# Patient Record
Sex: Female | Born: 1960
Health system: Southern US, Community
[De-identification: ages and names within clinical notes are randomized; demographics above are authoritative.]

## PROBLEM LIST (undated history)

## (undated) ENCOUNTER — Emergency Department (HOSPITAL_BASED_OUTPATIENT_CLINIC_OR_DEPARTMENT_OTHER): Admission: EM | Payer: 59 | Source: Home / Self Care

## (undated) DIAGNOSIS — K279 Peptic ulcer, site unspecified, unspecified as acute or chronic, without hemorrhage or perforation: Secondary | ICD-10-CM

## (undated) DIAGNOSIS — K810 Acute cholecystitis: Secondary | ICD-10-CM

## (undated) DIAGNOSIS — D649 Anemia, unspecified: Secondary | ICD-10-CM

## (undated) DIAGNOSIS — F419 Anxiety disorder, unspecified: Secondary | ICD-10-CM

## (undated) DIAGNOSIS — G43909 Migraine, unspecified, not intractable, without status migrainosus: Secondary | ICD-10-CM

## (undated) DIAGNOSIS — E785 Hyperlipidemia, unspecified: Secondary | ICD-10-CM

## (undated) DIAGNOSIS — Z8709 Personal history of other diseases of the respiratory system: Secondary | ICD-10-CM

## (undated) DIAGNOSIS — M62838 Other muscle spasm: Secondary | ICD-10-CM

## (undated) DIAGNOSIS — Z8719 Personal history of other diseases of the digestive system: Secondary | ICD-10-CM

## (undated) DIAGNOSIS — E039 Hypothyroidism, unspecified: Secondary | ICD-10-CM

## (undated) DIAGNOSIS — R51 Headache: Secondary | ICD-10-CM

## (undated) DIAGNOSIS — F5104 Psychophysiologic insomnia: Secondary | ICD-10-CM

## (undated) DIAGNOSIS — F32A Depression, unspecified: Secondary | ICD-10-CM

## (undated) DIAGNOSIS — G40909 Epilepsy, unspecified, not intractable, without status epilepticus: Secondary | ICD-10-CM

## (undated) DIAGNOSIS — R110 Nausea: Secondary | ICD-10-CM

## (undated) DIAGNOSIS — L280 Lichen simplex chronicus: Secondary | ICD-10-CM

## (undated) DIAGNOSIS — T8859XA Other complications of anesthesia, initial encounter: Secondary | ICD-10-CM

## (undated) DIAGNOSIS — E162 Hypoglycemia, unspecified: Secondary | ICD-10-CM

## (undated) DIAGNOSIS — A048 Other specified bacterial intestinal infections: Secondary | ICD-10-CM

## (undated) DIAGNOSIS — I82409 Acute embolism and thrombosis of unspecified deep veins of unspecified lower extremity: Secondary | ICD-10-CM

## (undated) DIAGNOSIS — K5900 Constipation, unspecified: Secondary | ICD-10-CM

## (undated) DIAGNOSIS — M7551 Bursitis of right shoulder: Secondary | ICD-10-CM

## (undated) DIAGNOSIS — G609 Hereditary and idiopathic neuropathy, unspecified: Secondary | ICD-10-CM

## (undated) DIAGNOSIS — E669 Obesity, unspecified: Secondary | ICD-10-CM

## (undated) DIAGNOSIS — K219 Gastro-esophageal reflux disease without esophagitis: Secondary | ICD-10-CM

## (undated) DIAGNOSIS — R569 Unspecified convulsions: Secondary | ICD-10-CM

## (undated) DIAGNOSIS — R0602 Shortness of breath: Secondary | ICD-10-CM

## (undated) DIAGNOSIS — R112 Nausea with vomiting, unspecified: Secondary | ICD-10-CM

## (undated) DIAGNOSIS — T7840XA Allergy, unspecified, initial encounter: Secondary | ICD-10-CM

## (undated) DIAGNOSIS — Z9889 Other specified postprocedural states: Secondary | ICD-10-CM

## (undated) DIAGNOSIS — M797 Fibromyalgia: Secondary | ICD-10-CM

## (undated) DIAGNOSIS — T4145XA Adverse effect of unspecified anesthetic, initial encounter: Secondary | ICD-10-CM

## (undated) DIAGNOSIS — M1990 Unspecified osteoarthritis, unspecified site: Secondary | ICD-10-CM

## (undated) DIAGNOSIS — R609 Edema, unspecified: Secondary | ICD-10-CM

## (undated) DIAGNOSIS — R600 Localized edema: Secondary | ICD-10-CM

## (undated) DIAGNOSIS — R1310 Dysphagia, unspecified: Secondary | ICD-10-CM

## (undated) DIAGNOSIS — F4323 Adjustment disorder with mixed anxiety and depressed mood: Secondary | ICD-10-CM

## (undated) DIAGNOSIS — J019 Acute sinusitis, unspecified: Secondary | ICD-10-CM

## (undated) DIAGNOSIS — F329 Major depressive disorder, single episode, unspecified: Secondary | ICD-10-CM

## (undated) HISTORY — DX: Peptic ulcer, site unspecified, unspecified as acute or chronic, without hemorrhage or perforation: K27.9

## (undated) HISTORY — DX: Depression, unspecified: F32.A

## (undated) HISTORY — DX: Other specified postprocedural states: Z98.890

## (undated) HISTORY — DX: Anemia, unspecified: D64.9

## (undated) HISTORY — DX: Bursitis of right shoulder: M75.51

## (undated) HISTORY — DX: Obesity, unspecified: E66.9

## (undated) HISTORY — DX: Psychophysiologic insomnia: F51.04

## (undated) HISTORY — DX: Migraine, unspecified, not intractable, without status migrainosus: G43.909

## (undated) HISTORY — DX: Acute cholecystitis: K81.0

## (undated) HISTORY — DX: Major depressive disorder, single episode, unspecified: F32.9

## (undated) HISTORY — DX: Headache: R51

## (undated) HISTORY — DX: Adjustment disorder with mixed anxiety and depressed mood: F43.23

## (undated) HISTORY — DX: Hereditary and idiopathic neuropathy, unspecified: G60.9

## (undated) HISTORY — DX: Epilepsy, unspecified, not intractable, without status epilepticus: G40.909

## (undated) HISTORY — DX: Hyperlipidemia, unspecified: E78.5

## (undated) HISTORY — DX: Allergy, unspecified, initial encounter: T78.40XA

## (undated) HISTORY — PX: COLONOSCOPY: SHX174

## (undated) HISTORY — DX: Other specified bacterial intestinal infections: A04.8

## (undated) HISTORY — DX: Acute sinusitis, unspecified: J01.90

## (undated) HISTORY — DX: Gastro-esophageal reflux disease without esophagitis: K21.9

---
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
## 1998-04-16 ENCOUNTER — Ambulatory Visit (HOSPITAL_COMMUNITY): Admission: RE | Admit: 1998-04-16 | Discharge: 1998-04-16 | Payer: Self-pay | Admitting: Gastroenterology

## 1998-06-03 ENCOUNTER — Ambulatory Visit (HOSPITAL_COMMUNITY): Admission: RE | Admit: 1998-06-03 | Discharge: 1998-06-03 | Payer: Self-pay | Admitting: Hematology & Oncology

## 1998-06-04 ENCOUNTER — Ambulatory Visit (HOSPITAL_COMMUNITY): Admission: RE | Admit: 1998-06-04 | Discharge: 1998-06-04 | Payer: Self-pay | Admitting: Hematology & Oncology

## 1998-07-13 ENCOUNTER — Encounter (HOSPITAL_COMMUNITY): Admission: RE | Admit: 1998-07-13 | Discharge: 1998-10-11 | Payer: Self-pay | Admitting: Hematology & Oncology

## 1998-12-11 DIAGNOSIS — Z9889 Other specified postprocedural states: Secondary | ICD-10-CM

## 1998-12-11 HISTORY — DX: Other specified postprocedural states: Z98.890

## 1998-12-11 HISTORY — PX: ABDOMINAL HYSTERECTOMY: SHX81

## 1999-05-02 ENCOUNTER — Ambulatory Visit (HOSPITAL_COMMUNITY): Admission: RE | Admit: 1999-05-02 | Discharge: 1999-05-02 | Payer: Self-pay | Admitting: Obstetrics and Gynecology

## 1999-06-15 ENCOUNTER — Encounter (HOSPITAL_COMMUNITY): Admission: RE | Admit: 1999-06-15 | Discharge: 1999-09-13 | Payer: Self-pay | Admitting: Hematology & Oncology

## 1999-06-28 LAB — CONVERTED CEMR LAB: Pap Smear: ABNORMAL

## 2000-01-02 ENCOUNTER — Ambulatory Visit (HOSPITAL_COMMUNITY): Admission: RE | Admit: 2000-01-02 | Discharge: 2000-01-02 | Payer: Self-pay | Admitting: Obstetrics and Gynecology

## 2000-03-27 ENCOUNTER — Encounter (HOSPITAL_COMMUNITY): Admission: RE | Admit: 2000-03-27 | Discharge: 2000-06-25 | Payer: Self-pay | Admitting: Hematology & Oncology

## 2001-10-04 ENCOUNTER — Ambulatory Visit (HOSPITAL_COMMUNITY): Admission: RE | Admit: 2001-10-04 | Discharge: 2001-10-04 | Payer: Self-pay | Admitting: Obstetrics and Gynecology

## 2001-10-04 ENCOUNTER — Encounter: Payer: Self-pay | Admitting: Obstetrics and Gynecology

## 2002-04-22 ENCOUNTER — Encounter: Payer: Self-pay | Admitting: Rheumatology

## 2002-04-22 ENCOUNTER — Encounter: Admission: RE | Admit: 2002-04-22 | Discharge: 2002-04-22 | Payer: Self-pay | Admitting: Rheumatology

## 2002-05-12 ENCOUNTER — Encounter: Payer: Self-pay | Admitting: Internal Medicine

## 2002-10-06 ENCOUNTER — Ambulatory Visit (HOSPITAL_COMMUNITY): Admission: RE | Admit: 2002-10-06 | Discharge: 2002-10-06 | Payer: Self-pay | Admitting: Obstetrics and Gynecology

## 2002-10-06 ENCOUNTER — Encounter: Payer: Self-pay | Admitting: Obstetrics and Gynecology

## 2003-10-09 ENCOUNTER — Ambulatory Visit (HOSPITAL_COMMUNITY): Admission: RE | Admit: 2003-10-09 | Discharge: 2003-10-09 | Payer: Self-pay | Admitting: Obstetrics and Gynecology

## 2004-10-07 ENCOUNTER — Ambulatory Visit (HOSPITAL_COMMUNITY): Admission: RE | Admit: 2004-10-07 | Discharge: 2004-10-07 | Payer: Self-pay | Admitting: Obstetrics and Gynecology

## 2004-10-12 ENCOUNTER — Emergency Department (HOSPITAL_COMMUNITY): Admission: EM | Admit: 2004-10-12 | Discharge: 2004-10-12 | Payer: Self-pay | Admitting: Emergency Medicine

## 2004-10-21 ENCOUNTER — Ambulatory Visit (HOSPITAL_COMMUNITY): Admission: RE | Admit: 2004-10-21 | Discharge: 2004-10-21 | Payer: Self-pay | Admitting: Pediatrics

## 2004-10-27 ENCOUNTER — Ambulatory Visit: Payer: Self-pay | Admitting: Hematology & Oncology

## 2004-12-20 ENCOUNTER — Ambulatory Visit: Payer: Self-pay | Admitting: Hematology & Oncology

## 2005-03-20 ENCOUNTER — Ambulatory Visit: Payer: Self-pay | Admitting: Hematology & Oncology

## 2005-08-10 ENCOUNTER — Ambulatory Visit: Payer: Self-pay | Admitting: Hematology & Oncology

## 2005-10-09 ENCOUNTER — Ambulatory Visit (HOSPITAL_COMMUNITY): Admission: RE | Admit: 2005-10-09 | Discharge: 2005-10-09 | Payer: Self-pay | Admitting: Obstetrics and Gynecology

## 2005-10-16 ENCOUNTER — Ambulatory Visit: Payer: Self-pay | Admitting: Hematology & Oncology

## 2006-01-09 ENCOUNTER — Ambulatory Visit: Payer: Self-pay | Admitting: Hematology & Oncology

## 2006-04-02 ENCOUNTER — Ambulatory Visit: Payer: Self-pay | Admitting: Hematology & Oncology

## 2006-04-02 LAB — CBC WITH DIFFERENTIAL/PLATELET
Basophils Absolute: 0 10*3/uL (ref 0.0–0.1)
Eosinophils Absolute: 0.1 10*3/uL (ref 0.0–0.5)
HGB: 12 g/dL (ref 11.6–15.9)
LYMPH%: 19.8 % (ref 14.0–48.0)
MCV: 71.3 fL — ABNORMAL LOW (ref 81.0–101.0)
MONO#: 0.4 10*3/uL (ref 0.1–0.9)
MONO%: 8.9 % (ref 0.0–13.0)
NEUT#: 3.2 10*3/uL (ref 1.5–6.5)
Platelets: 225 10*3/uL (ref 145–400)
RDW: 15.1 % — ABNORMAL HIGH (ref 11.3–14.5)

## 2006-04-02 LAB — COMPREHENSIVE METABOLIC PANEL
Albumin: 4.3 g/dL (ref 3.5–5.2)
Alkaline Phosphatase: 61 U/L (ref 39–117)
BUN: 10 mg/dL (ref 6–23)
CO2: 26 mEq/L (ref 19–32)
Glucose, Bld: 79 mg/dL (ref 70–99)
Potassium: 3.5 mEq/L (ref 3.5–5.3)

## 2006-04-02 LAB — LIPID PANEL
Cholesterol: 262 mg/dL — ABNORMAL HIGH (ref 0–200)
HDL: 79 mg/dL (ref >39)
LDL Cholesterol: 166 mg/dL — ABNORMAL HIGH (ref 0–99)
Triglycerides: 84 mg/dL (ref <150)

## 2006-05-28 ENCOUNTER — Ambulatory Visit: Payer: Self-pay | Admitting: Hematology & Oncology

## 2006-05-28 LAB — URINALYSIS, MICROSCOPIC - CHCC
Bilirubin (Urine): NEGATIVE
Blood: NEGATIVE
Glucose: NEGATIVE g/dL
Specific Gravity, Urine: 1.03 (ref 1.003–1.035)
WBC, UA: NEGATIVE (ref 0–2)

## 2006-06-05 LAB — URINALYSIS, MICROSCOPIC - CHCC
Bilirubin (Urine): NEGATIVE
Glucose: NEGATIVE g/dL
Leukocyte Esterase: NEGATIVE
Nitrite: NEGATIVE
pH: 6.5 (ref 4.6–8.0)

## 2006-09-04 ENCOUNTER — Ambulatory Visit: Payer: Self-pay | Admitting: Hematology & Oncology

## 2006-09-04 LAB — CBC WITH DIFFERENTIAL/PLATELET
Basophils Absolute: 0 10*3/uL (ref 0.0–0.1)
EOS%: 1.8 % (ref 0.0–7.0)
LYMPH%: 18.5 % (ref 14.0–48.0)
MCH: 23 pg — ABNORMAL LOW (ref 26.0–34.0)
MCV: 70.9 fL — ABNORMAL LOW (ref 81.0–101.0)
MONO%: 10.1 % (ref 0.0–13.0)
Platelets: 210 10*3/uL (ref 145–400)
RBC: 5.24 10*6/uL (ref 3.70–5.32)
RDW: 15.2 % — ABNORMAL HIGH (ref 11.3–14.5)

## 2006-09-04 LAB — LIPID PANEL
Cholesterol: 274 mg/dL — ABNORMAL HIGH (ref 0–200)
HDL: 72 mg/dL (ref >39)
Total CHOL/HDL Ratio: 3.8 Ratio
Triglycerides: 56 mg/dL (ref <150)

## 2006-09-04 LAB — COMPREHENSIVE METABOLIC PANEL
BUN: 10 mg/dL (ref 6–23)
CO2: 24 mEq/L (ref 19–32)
Creatinine, Ser: 0.77 mg/dL (ref 0.40–1.20)
Glucose, Bld: 86 mg/dL (ref 70–99)
Total Bilirubin: 0.6 mg/dL (ref 0.3–1.2)

## 2006-10-17 LAB — CBC WITH DIFFERENTIAL/PLATELET
BASO%: 0.3 % (ref 0.0–2.0)
Eosinophils Absolute: 0.2 10*3/uL (ref 0.0–0.5)
MONO#: 0.6 10*3/uL (ref 0.1–0.9)
NEUT#: 4.2 10*3/uL (ref 1.5–6.5)
RBC: 5.51 10*6/uL — ABNORMAL HIGH (ref 3.70–5.32)
RDW: 14.8 % — ABNORMAL HIGH (ref 11.3–14.5)
WBC: 5.9 10*3/uL (ref 3.9–10.0)
lymph#: 0.9 10*3/uL (ref 0.9–3.3)

## 2006-10-17 LAB — LIPID PANEL
Cholesterol: 189 mg/dL (ref 0–200)
Total CHOL/HDL Ratio: 2.2 Ratio
Triglycerides: 59 mg/dL (ref <150)
VLDL: 12 mg/dL (ref 0–40)

## 2006-11-05 ENCOUNTER — Ambulatory Visit (HOSPITAL_COMMUNITY): Admission: RE | Admit: 2006-11-05 | Discharge: 2006-11-05 | Payer: Self-pay | Admitting: Obstetrics and Gynecology

## 2006-12-19 ENCOUNTER — Ambulatory Visit: Payer: Self-pay | Admitting: Hematology & Oncology

## 2006-12-19 LAB — COMPREHENSIVE METABOLIC PANEL
AST: 21 U/L (ref 0–37)
Alkaline Phosphatase: 67 U/L (ref 39–117)
BUN: 9 mg/dL (ref 6–23)
Creatinine, Ser: 0.76 mg/dL (ref 0.40–1.20)
Glucose, Bld: 85 mg/dL (ref 70–99)
Total Bilirubin: 0.7 mg/dL (ref 0.3–1.2)

## 2006-12-19 LAB — CBC WITH DIFFERENTIAL/PLATELET
Basophils Absolute: 0 10*3/uL (ref 0.0–0.1)
EOS%: 1.9 % (ref 0.0–7.0)
Eosinophils Absolute: 0.1 10*3/uL (ref 0.0–0.5)
HCT: 38.8 % (ref 34.8–46.6)
HGB: 12.5 g/dL (ref 11.6–15.9)
LYMPH%: 16.8 % (ref 14.0–48.0)
MCH: 23.1 pg — ABNORMAL LOW (ref 26.0–34.0)
MCV: 71.6 fL — ABNORMAL LOW (ref 81.0–101.0)
MONO%: 9.5 % (ref 0.0–13.0)
NEUT%: 71.5 % (ref 39.6–76.8)
Platelets: 198 10*3/uL (ref 145–400)
RDW: 14.6 % — ABNORMAL HIGH (ref 11.3–14.5)

## 2006-12-19 LAB — LIPID PANEL
LDL Cholesterol: 115 mg/dL — ABNORMAL HIGH (ref 0–99)
VLDL: 11 mg/dL (ref 0–40)

## 2007-12-10 ENCOUNTER — Ambulatory Visit (HOSPITAL_COMMUNITY): Admission: RE | Admit: 2007-12-10 | Discharge: 2007-12-10 | Payer: Self-pay | Admitting: Obstetrics and Gynecology

## 2008-01-28 ENCOUNTER — Ambulatory Visit: Payer: Self-pay | Admitting: Hematology & Oncology

## 2008-01-28 LAB — COMPREHENSIVE METABOLIC PANEL
ALT: <8 U/L (ref 0–35)
AST: 11 U/L (ref 0–37)
Alkaline Phosphatase: 57 U/L (ref 39–117)
CO2: 23 mEq/L (ref 19–32)
Sodium: 138 mEq/L (ref 135–145)
Total Bilirubin: 0.7 mg/dL (ref 0.3–1.2)
Total Protein: 6.6 g/dL (ref 6.0–8.3)

## 2008-01-28 LAB — CBC WITH DIFFERENTIAL/PLATELET
Basophils Absolute: 0 10*3/uL (ref 0.0–0.1)
Eosinophils Absolute: 0.1 10*3/uL (ref 0.0–0.5)
HCT: 38.4 % (ref 34.8–46.6)
HGB: 11.7 g/dL (ref 11.6–15.9)
LYMPH%: 23.7 % (ref 14.0–48.0)
MCH: 21.2 pg — ABNORMAL LOW (ref 26.0–34.0)
MCV: 69.4 fL — ABNORMAL LOW (ref 81.0–101.0)
MONO%: 8.6 % (ref 0.0–13.0)
NEUT#: 3.1 10*3/uL (ref 1.5–6.5)
NEUT%: 64.8 % (ref 39.6–76.8)
Platelets: 196 10*3/uL (ref 145–400)

## 2008-01-28 LAB — LIPID PANEL
LDL Cholesterol: 95 mg/dL (ref 0–99)
Total CHOL/HDL Ratio: 2.7 Ratio
VLDL: 11 mg/dL (ref 0–40)

## 2008-01-28 LAB — TSH: TSH: 1.968 u[IU]/mL (ref 0.350–5.500)

## 2008-06-03 ENCOUNTER — Ambulatory Visit: Payer: Self-pay | Admitting: Hematology & Oncology

## 2008-06-03 LAB — CBC WITH DIFFERENTIAL/PLATELET
Basophils Absolute: 0 10*3/uL (ref 0.0–0.1)
EOS%: 3 % (ref 0.0–7.0)
HGB: 11.5 g/dL — ABNORMAL LOW (ref 11.6–15.9)
LYMPH%: 22.3 % (ref 14.0–48.0)
MCH: 22.7 pg — ABNORMAL LOW (ref 26.0–34.0)
MCV: 69.2 fL — ABNORMAL LOW (ref 81.0–101.0)
MONO%: 9.7 % (ref 0.0–13.0)
Platelets: 199 10*3/uL (ref 145–400)
RDW: 15.1 % — ABNORMAL HIGH (ref 11.3–14.5)

## 2008-06-04 LAB — COMPREHENSIVE METABOLIC PANEL
Albumin: 4 g/dL (ref 3.5–5.2)
BUN: 10 mg/dL (ref 6–23)
Calcium: 8.7 mg/dL (ref 8.4–10.5)
Chloride: 106 mEq/L (ref 96–112)
Creatinine, Ser: 0.87 mg/dL (ref 0.40–1.20)
Glucose, Bld: 79 mg/dL (ref 70–99)
Potassium: 3.9 mEq/L (ref 3.5–5.3)

## 2008-06-04 LAB — LIPID PANEL
Cholesterol: 187 mg/dL (ref 0–200)
Triglycerides: 66 mg/dL (ref <150)

## 2008-06-04 LAB — VITAMIN D 25 HYDROXY (VIT D DEFICIENCY, FRACTURES): Vit D, 25-Hydroxy: 8 ng/mL — ABNORMAL LOW (ref 30–89)

## 2008-07-12 ENCOUNTER — Emergency Department (HOSPITAL_COMMUNITY): Admission: EM | Admit: 2008-07-12 | Discharge: 2008-07-12 | Payer: Self-pay | Admitting: Emergency Medicine

## 2009-01-11 ENCOUNTER — Ambulatory Visit (HOSPITAL_COMMUNITY): Admission: RE | Admit: 2009-01-11 | Discharge: 2009-01-11 | Payer: Self-pay | Admitting: Obstetrics and Gynecology

## 2009-01-15 ENCOUNTER — Encounter: Admission: RE | Admit: 2009-01-15 | Discharge: 2009-01-15 | Payer: Self-pay | Admitting: Obstetrics and Gynecology

## 2009-06-29 LAB — HM COLONOSCOPY: HM Colonoscopy: NORMAL

## 2009-07-01 ENCOUNTER — Ambulatory Visit: Payer: Self-pay | Admitting: Hematology & Oncology

## 2009-07-01 LAB — CBC WITH DIFFERENTIAL/PLATELET
Eosinophils Absolute: 0.1 10*3/uL (ref 0.0–0.5)
HCT: 36.4 % (ref 34.8–46.6)
HGB: 11.7 g/dL (ref 11.6–15.9)
LYMPH%: 19.5 % (ref 14.0–49.7)
MONO#: 0.4 10*3/uL (ref 0.1–0.9)
NEUT#: 3.7 10*3/uL (ref 1.5–6.5)
NEUT%: 70.3 % (ref 38.4–76.8)
Platelets: 190 10*3/uL (ref 145–400)
WBC: 5.2 10*3/uL (ref 3.9–10.3)

## 2009-07-01 LAB — LIPID PANEL
Cholesterol: 253 mg/dL — ABNORMAL HIGH (ref 0–200)
Total CHOL/HDL Ratio: 3.9 Ratio
Triglycerides: 61 mg/dL (ref <150)
VLDL: 12 mg/dL (ref 0–40)

## 2009-07-01 LAB — COMPREHENSIVE METABOLIC PANEL
CO2: 23 mEq/L (ref 19–32)
Creatinine, Ser: 0.91 mg/dL (ref 0.40–1.20)
Glucose, Bld: 87 mg/dL (ref 70–99)
Total Bilirubin: 0.8 mg/dL (ref 0.3–1.2)
Total Protein: 6.8 g/dL (ref 6.0–8.3)

## 2009-09-24 ENCOUNTER — Encounter: Payer: Self-pay | Admitting: Internal Medicine

## 2009-09-24 ENCOUNTER — Ambulatory Visit: Payer: Self-pay | Admitting: Hematology & Oncology

## 2009-09-24 LAB — CBC WITH DIFFERENTIAL (CANCER CENTER ONLY)
BASO#: 0 10*3/uL (ref 0.0–0.2)
BASO%: 0.5 % (ref 0.0–2.0)
HCT: 37.1 % (ref 34.8–46.6)
HGB: 12.1 g/dL (ref 11.6–15.9)
LYMPH#: 1.4 10*3/uL (ref 0.9–3.3)
MONO#: 0.4 10*3/uL (ref 0.1–0.9)
NEUT#: 2.9 10*3/uL (ref 1.5–6.5)
NEUT%: 58.6 % (ref 39.6–80.0)
WBC: 4.9 10*3/uL (ref 3.9–10.0)

## 2009-09-24 LAB — COMPREHENSIVE METABOLIC PANEL
ALT: 12 U/L (ref 0–35)
AST: 20 U/L (ref 0–37)
Albumin: 4.6 g/dL (ref 3.5–5.2)
Alkaline Phosphatase: 64 U/L (ref 39–117)
BUN: 11 mg/dL (ref 6–23)
CO2: 27 mEq/L (ref 19–32)
Calcium: 8.9 mg/dL (ref 8.4–10.5)
Chloride: 101 mEq/L (ref 96–112)
Creatinine, Ser: 0.94 mg/dL (ref 0.40–1.20)
Glucose, Bld: 93 mg/dL (ref 70–99)
Potassium: 3.7 mEq/L (ref 3.5–5.3)
Sodium: 138 mEq/L (ref 135–145)
Total Bilirubin: 0.5 mg/dL (ref 0.3–1.2)
Total Protein: 7.4 g/dL (ref 6.0–8.3)

## 2009-09-24 LAB — LIPID PANEL: HDL: 67 mg/dL (ref >39)

## 2009-09-24 LAB — TSH: TSH: 2.165 u[IU]/mL (ref 0.350–4.500)

## 2009-09-28 ENCOUNTER — Ambulatory Visit: Payer: Self-pay | Admitting: Internal Medicine

## 2009-09-28 DIAGNOSIS — F418 Other specified anxiety disorders: Secondary | ICD-10-CM | POA: Insufficient documentation

## 2009-09-28 DIAGNOSIS — E782 Mixed hyperlipidemia: Secondary | ICD-10-CM | POA: Insufficient documentation

## 2009-09-28 DIAGNOSIS — D649 Anemia, unspecified: Secondary | ICD-10-CM | POA: Insufficient documentation

## 2009-09-28 DIAGNOSIS — R569 Unspecified convulsions: Secondary | ICD-10-CM | POA: Insufficient documentation

## 2009-09-28 DIAGNOSIS — F4323 Adjustment disorder with mixed anxiety and depressed mood: Secondary | ICD-10-CM

## 2009-09-28 DIAGNOSIS — E669 Obesity, unspecified: Secondary | ICD-10-CM | POA: Insufficient documentation

## 2009-09-28 HISTORY — DX: Adjustment disorder with mixed anxiety and depressed mood: F43.23

## 2009-09-28 LAB — CONVERTED CEMR LAB: Hgb A1c MFr Bld: 5.6 % (ref 4.6–6.1)

## 2009-09-29 ENCOUNTER — Telehealth: Payer: Self-pay | Admitting: Internal Medicine

## 2009-09-30 ENCOUNTER — Telehealth: Payer: Self-pay | Admitting: Internal Medicine

## 2009-11-09 ENCOUNTER — Ambulatory Visit: Payer: Self-pay | Admitting: Internal Medicine

## 2009-11-09 DIAGNOSIS — R110 Nausea: Secondary | ICD-10-CM | POA: Insufficient documentation

## 2009-11-09 DIAGNOSIS — IMO0001 Reserved for inherently not codable concepts without codable children: Secondary | ICD-10-CM | POA: Insufficient documentation

## 2009-11-16 ENCOUNTER — Encounter: Payer: Self-pay | Admitting: Internal Medicine

## 2009-11-16 LAB — CONVERTED CEMR LAB
ALT: 14 units/L (ref 0–35)
AST: 25 units/L (ref 0–37)
Albumin: 4.1 g/dL (ref 3.5–5.2)
Alkaline Phosphatase: 70 units/L (ref 39–117)
Bilirubin, Direct: 0.1 mg/dL (ref 0.0–0.3)
Cholesterol: 240 mg/dL — ABNORMAL HIGH (ref 0–200)
HDL: 77 mg/dL (ref >39)
Indirect Bilirubin: 0.3 mg/dL (ref 0.0–0.9)
LDL Cholesterol: 148 mg/dL — ABNORMAL HIGH (ref 0–99)
Total Bilirubin: 0.4 mg/dL (ref 0.3–1.2)
Total CHOL/HDL Ratio: 3.1
Total Protein: 6.7 g/dL (ref 6.0–8.3)
Triglycerides: 73 mg/dL (ref <150)
VLDL: 15 mg/dL (ref 0–40)

## 2009-11-23 ENCOUNTER — Ambulatory Visit: Payer: Self-pay | Admitting: Internal Medicine

## 2009-11-23 DIAGNOSIS — K5900 Constipation, unspecified: Secondary | ICD-10-CM | POA: Insufficient documentation

## 2009-12-07 ENCOUNTER — Telehealth: Payer: Self-pay | Admitting: Internal Medicine

## 2009-12-07 ENCOUNTER — Ambulatory Visit: Payer: Self-pay | Admitting: Internal Medicine

## 2009-12-07 DIAGNOSIS — J018 Other acute sinusitis: Secondary | ICD-10-CM | POA: Insufficient documentation

## 2010-01-17 LAB — HM MAMMOGRAPHY: HM Mammogram: NORMAL

## 2010-01-18 ENCOUNTER — Ambulatory Visit (HOSPITAL_COMMUNITY): Admission: RE | Admit: 2010-01-18 | Discharge: 2010-01-18 | Payer: Self-pay | Admitting: Obstetrics and Gynecology

## 2010-02-22 ENCOUNTER — Ambulatory Visit: Payer: Self-pay | Admitting: Internal Medicine

## 2010-02-22 DIAGNOSIS — J069 Acute upper respiratory infection, unspecified: Secondary | ICD-10-CM | POA: Insufficient documentation

## 2010-04-25 ENCOUNTER — Telehealth: Payer: Self-pay | Admitting: Internal Medicine

## 2010-05-17 ENCOUNTER — Ambulatory Visit: Payer: Self-pay | Admitting: Internal Medicine

## 2010-10-11 ENCOUNTER — Ambulatory Visit: Payer: Self-pay | Admitting: Internal Medicine

## 2010-10-21 LAB — CONVERTED CEMR LAB
BUN: 9 mg/dL (ref 6–23)
CO2: 27 meq/L (ref 19–32)
CRP, High Sensitivity: 8.8 — ABNORMAL HIGH
Calcium: 8.9 mg/dL (ref 8.4–10.5)
Chloride: 105 meq/L (ref 96–112)
Cholesterol: 266 mg/dL — ABNORMAL HIGH (ref 0–200)
Creatinine, Ser: 0.93 mg/dL (ref 0.40–1.20)
Glucose, Bld: 82 mg/dL (ref 70–99)
HDL: 77 mg/dL (ref >39)
LDL Cholesterol: 174 mg/dL — ABNORMAL HIGH (ref 0–99)
Potassium: 4.1 meq/L (ref 3.5–5.3)
Sodium: 141 meq/L (ref 135–145)
Total CHOL/HDL Ratio: 3.5
Triglycerides: 73 mg/dL (ref <150)
VLDL: 15 mg/dL (ref 0–40)

## 2010-10-22 ENCOUNTER — Telehealth: Payer: Self-pay | Admitting: Internal Medicine

## 2010-12-15 ENCOUNTER — Ambulatory Visit
Admission: RE | Admit: 2010-12-15 | Discharge: 2010-12-15 | Payer: Self-pay | Source: Home / Self Care | Attending: Internal Medicine | Admitting: Internal Medicine

## 2010-12-15 DIAGNOSIS — L259 Unspecified contact dermatitis, unspecified cause: Secondary | ICD-10-CM | POA: Insufficient documentation

## 2010-12-20 ENCOUNTER — Ambulatory Visit
Admission: RE | Admit: 2010-12-20 | Discharge: 2010-12-20 | Payer: Self-pay | Source: Home / Self Care | Attending: Internal Medicine | Admitting: Internal Medicine

## 2010-12-20 LAB — CONVERTED CEMR LAB
ALT: 15 units/L (ref 0–35)
AST: 21 units/L (ref 0–37)
Albumin: 4.6 g/dL (ref 3.5–5.2)
Alkaline Phosphatase: 70 units/L (ref 39–117)
Basophils Absolute: 0 10*3/uL (ref 0.0–0.1)
Basophils Relative: 0 % (ref 0–1)
Bilirubin, Direct: 0.1 mg/dL (ref 0.0–0.3)
Eosinophils Absolute: 0.1 10*3/uL (ref 0.0–0.7)
Eosinophils Relative: 1 % (ref 0–5)
Free T4: 0.92 ng/dL (ref 0.80–1.80)
HCT: 37.6 % (ref 36.0–46.0)
Hemoglobin: 11.8 g/dL — ABNORMAL LOW (ref 12.0–15.0)
IgE (Immunoglobulin E), Serum: 55.8 intl units/mL (ref 0.0–180.0)
Indirect Bilirubin: 0.5 mg/dL (ref 0.0–0.9)
Lymphocytes Relative: 11 % — ABNORMAL LOW (ref 12–46)
Lymphs Abs: 0.8 10*3/uL (ref 0.7–4.0)
MCHC: 31.4 g/dL (ref 30.0–36.0)
MCV: 71.8 fL — ABNORMAL LOW (ref 78.0–100.0)
Monocytes Absolute: 0.3 10*3/uL (ref 0.1–1.0)
Monocytes Relative: 4 % (ref 3–12)
Neutro Abs: 6.1 10*3/uL (ref 1.7–7.7)
Neutrophils Relative %: 84 % — ABNORMAL HIGH (ref 43–77)
Platelets: 233 10*3/uL (ref 150–400)
RBC: 5.24 M/uL — ABNORMAL HIGH (ref 3.87–5.11)
RDW: 15.3 % (ref 11.5–15.5)
TSH: 1.018 microintl units/mL (ref 0.350–4.500)
Total Bilirubin: 0.6 mg/dL (ref 0.3–1.2)
Total Protein: 7.5 g/dL (ref 6.0–8.3)
WBC: 7.2 10*3/uL (ref 4.0–10.5)

## 2010-12-22 ENCOUNTER — Telehealth: Payer: Self-pay | Admitting: Internal Medicine

## 2010-12-29 ENCOUNTER — Other Ambulatory Visit (HOSPITAL_COMMUNITY): Payer: Self-pay | Admitting: *Deleted

## 2010-12-29 DIAGNOSIS — Z1231 Encounter for screening mammogram for malignant neoplasm of breast: Secondary | ICD-10-CM

## 2010-12-29 DIAGNOSIS — Z0000 Encounter for general adult medical examination without abnormal findings: Secondary | ICD-10-CM

## 2011-01-12 NOTE — Assessment & Plan Note (Signed)
Summary: skin on fire mouth dry/mhf   Vital Signs:  Patient profile:   50 year old female Height:      67 inches O2 Sat:      100 % on Room air Temp:     97.6 degrees F oral Pulse rate:   77 / minute Resp:     18 per minute BP sitting:   120 / 90  (left arm) Cuff size:   large  Vitals Entered By: Glendell Docker CMA (December 15, 2010 11:48 AM)  O2 Flow:  Room air CC: Skin irritation Is Patient Diabetic? No Pain Assessment Patient in pain? no      Comments c/o sever skin irritation, burning sensation  on arms x 3 days, worse with cold weather    Primary Care Provider:  Dondra Spry DO  CC:  Skin irritation.  History of Present Illness: 50 y/o AA female c/o skin irritation of both arms area from wrist to shoulders affected red, warm and itchy  has hx of episodic skin flares seen by Dr. Terri Piedra with multple biopsies pt later referred to derm specialist at [facility] diagnosis unclear question eczema?  she denies new environmental exposure   Allergies: 1)  ! Darvocet 2)  ! Tylox 3)  ! Vicodin 4)  ! * Iv Iron 5)  ! 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 Sharene Skeans )  Bursitis - right shoulder PUD - Dr. Loreta Ave (H. Pylori) Colonoscopy -2000 Chronic Insomnia Migraines   Obesity   Past Surgical History: 2000-Hysterectomy-left ovary and tube intact 1972 -Tonsillectomy           Family History: Family History Breast cancer 1st degree relative <50 Family History of CAD Female 1st degree relative <60 Family History Diabetes 1st degree relative Family History of Prostate CA 1st degree relative <50             Social History: Occupation: Water quality scientist Divorced   2 sons 26, 62    Never Smoked   Alcohol use-no       Physical Exam  General:  alert, well-developed, and well-nourished.   Lungs:  normal respiratory effort and normal  breath sounds.   Heart:  normal rate, regular rhythm, and no gallop.     Impression & Recommendations:  Problem # 1:  DERMATITIS (ICD-692.9) probable allergic reaction.  unknown trigger.  tx with solumedrol and prednisone taper I doubt cellulitis but empiric clindamycin Patient advised to call office if symptoms persist or worsen. reassess in 1 week Her updated medication list for this problem includes:    Zyrtec Allergy 10 Mg Tbdp (Cetirizine hcl) ..... One by mouth once daily  Orders: Solumedrol up to 125mg  (Y7829) Admin of Therapeutic Inj  intramuscular or subcutaneous (56213)  Complete Medication List: 1)  Lorazepam 2 Mg Tabs (Lorazepam) .... One by mouth three times a day 2)  Sertraline Hcl 100 Mg Tabs (Sertraline hcl) .... 2 tabs by mouth qd 3)  Lyrica 50 Mg Caps (Pregabalin) .... One by mouth two times a day 4)  Zyrtec Allergy 10 Mg Tbdp (Cetirizine hcl) .... One by mouth once daily 5)  Nasonex 50 Mcg/act Susp (Mometasone furoate) .... 2 sprays each nostril once daily 6)  Omeprazole-sodium Bicarbonate 40-1100 Mg Caps (Omeprazole-sodium bicarbonate) .... One by mouth once daily 7)  Amitiza 24 Mcg Caps (Lubiprostone) .... One by mouth two times a day  8)  Crestor 20 Mg Tabs (Rosuvastatin calcium) .... One by mouth qd  Patient Instructions: 1)  Patient advised to call office if symptoms persist or worsen. 2)  Please schedule a follow-up appointment 12/19/2010 Prescriptions: PREDNISONE 10 MG TABS (PREDNISONE) one by mouth two times a day x 5 days then one by mouth once daily x 5 days  #15 x 0   Entered and Authorized by:   D. Thomos Lemons DO   Signed by:   D. Thomos Lemons DO on 12/15/2010   Method used:   Electronically to        [facility]* (retail)       [address]       [address] Shipping/mailing       [place], [place]  [address]       Ph: [phone]       Fax: [phone]   RxID:   [id number] CLINDAMYCIN HCL 300 MG CAPS (CLINDAMYCIN HCL) one  by mouth three times a day  #21 x 0   Entered and Authorized by:   D. Thomos Lemons DO   Signed by:   D. Thomos Lemons DO on 12/15/2010   Method used:   Electronically to        [facility]* (retail)       [address]       [address] Shipping/mailing       [place], [place]  [address]       Ph: [phone]       Fax: [phone]   RxID:   [id number]    Medication Administration  Injection # 1:    Medication: Solumedrol up to 125mg     Diagnosis: DERMATITIS (ICD-692.9)    Route: IM    Site: LUOQ gluteus    Exp Date: 03/10/2013    Lot #: [id number]    Mfr: PFIZER    Comments: 125 MG solu-medrol IM given    Patient tolerated injection without complications    Given by: Glendell Docker CMA (December 15, 2010 12:16 PM)  Orders Added: 1)  Solumedrol up to 125mg  [J2930] 2)  Admin of Therapeutic Inj  intramuscular or subcutaneous [96372] 3)  Est. Patient Level III [60630]    Current Allergies (reviewed today): ! DARVOCET ! TYLOX ! VICODIN ! * IV IRON ! * IVP DYE   Medication Administration  Injection # 1:    Medication: Solumedrol up to 125mg     Diagnosis: DERMATITIS (ICD-692.9)    Route: IM    Site: LUOQ gluteus    Exp Date: 03/10/2013    Lot #: [id number]    Mfr: PFIZER    Comments: 125 MG solu-medrol IM given    Patient tolerated injection without complications    Given by: Glendell Docker CMA (December 15, 2010 12:16 PM)  Orders Added: 1)  Solumedrol up to 125mg  [J2930] 2)  Admin of Therapeutic Inj  intramuscular or subcutaneous [96372] 3)  Est. Patient Level III [93235]

## 2011-01-12 NOTE — Assessment & Plan Note (Signed)
Summary: CPX/MHF   Vital Signs:  Patient profile:   50 year old female Height:      67 inches Weight:      238.50 pounds BMI:     37.49 O2 Sat:      100 % on Room air Temp:     98.1 degrees F oral Pulse rate:   95 / minute Pulse rhythm:   regular Resp:     18 per minute BP sitting:   122 / 90  (right arm) Cuff size:   large  Vitals Entered By: Glendell Docker CMA (October 11, 2010 2:15 PM)  O2 Flow:  Room air CC: CPX Is Patient Diabetic? No Pain Assessment Patient in pain? no        Primary Care Provider:  Dondra Spry DO  CC:  CPX.  History of Present Illness: 50 y/o AA female with hx obesity, fibromyalgia , anxiety / depression for routine cpx no sign int med hx wt is stable she has noticed intermittent DOE no chest pains  Preventive Screening-Counseling & Management  Alcohol-Tobacco     Alcohol drinks/day: <1     Smoking Status: current  Caffeine-Diet-Exercise     Caffeine use/day: 1 beverage daily     Does Patient Exercise: yes     Type of exercise: walking  Allergies: 1)  ! Darvocet 2)  ! Tylox 3)  ! Vicodin 4)  ! * Iv Iron 5)  ! 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 Sharene Skeans )  Bursitis - right shoulder PUD - Dr. Loreta Ave (H. Pylori) Colonoscopy -2000 Chronic Insomnia Migraines   Obesity   Past Surgical History: 2000-Hysterectomy-left ovary and tube intact 1972 -Tonsillectomy          Family History: Family History Breast cancer 1st degree relative <50 Family History of CAD Female 1st degree relative <60 Family History Diabetes 1st degree relative Family History of Prostate CA 1st degree relative <50            Social History: Occupation: Water quality scientist Divorced   2 sons 26, 68    Never Smoked   Alcohol use-no    Smoking Status:  current Caffeine use/day:  1 beverage daily Does Patient Exercise:   yes  Review of Systems       The patient complains of weight gain, dyspnea on exertion, and severe indigestion/heartburn.  The patient denies chest pain and depression.         chronic constipation no improvement with otc miralax or lactulose  Physical Exam  General:  alert and well-developed.   Head:  normocephalic and atraumatic.   Eyes:  pupils equal, pupils round, and pupils reactive to light.   Ears:  R ear normal and L ear normal.   Mouth:  pharynx pink and moist.   Neck:  No deformities, masses, or tenderness noted.no carotid bruits.   Lungs:  Normal respiratory effort, chest expands symmetrically. Lungs are clear to auscultation, no crackles or wheezes. Heart:  normal rate, regular rhythm, and no gallop.   Abdomen:  soft, non-tender, normal bowel sounds, no hepatomegaly, and no splenomegaly.   Extremities:  trace left pedal edema and trace right pedal edema.   Neurologic:  cranial nerves II-XII intact and gait normal.   Psych:  normally interactive, good eye contact, not anxious appearing, and not depressed appearing.     Impression & Recommendations:  Problem # 1:  HEALTH MAINTENANCE EXAM (ICD-V70.0) Reviewed adult health maintenance protocols.  Mammogram: Done (01/17/2010) Pap smear: normal (05/03/2010) Colonoscopy: normal (06/29/2009) Td Booster: Tdap (10/03/2005)   Flu Vax: Fluvax Non-MCR (09/17/2009)   Chol: 240 (11/16/2009)   HDL: 77 (11/16/2009)   LDL: 148 (11/16/2009)   TG: 73 (11/16/2009) HgbA1C: 5.6 (09/28/2009)     Orders: Gastroenterology Referral (GI) EKG w/ Interpretation (93000)  Problem # 2:  CONSTIPATION (ICD-564.00) use amitiza increase fluid intake  Problem # 3:  OBESITY (ICD-278.00) pt advised to stop use of phentermine. if dyspnea recurrs or gets worse, we discussed initiating cardiac work up  Orders: T-Basic Metabolic Panel [phone])  Complete Medication List: 1)  Lorazepam 2 Mg Tabs (Lorazepam) .... One by mouth three times a  day 2)  Sertraline Hcl 100 Mg Tabs (Sertraline hcl) .... 2 tabs by mouth qd 3)  Lyrica 50 Mg Caps (Pregabalin) .... One by mouth two times a day 4)  Zyrtec Allergy 10 Mg Tbdp (Cetirizine hcl) .... One by mouth once daily 5)  Nasonex 50 Mcg/act Susp (Mometasone furoate) .... 2 sprays each nostril once daily 6)  Omeprazole-sodium Bicarbonate 40-1100 Mg Caps (Omeprazole-sodium bicarbonate) .... One by mouth once daily 7)  Amitiza 24 Mcg Caps (Lubiprostone) .... One by mouth two times a day  Other Orders: T-Lipid Profile ([id number]) T- * Misc. Laboratory test [phone])  Patient Instructions: 1)  Please schedule a follow-up appointment in 6 months. Prescriptions: LORAZEPAM 2 MG TABS (LORAZEPAM) one by mouth three times a day  #90 x 5   Entered and Authorized by:   D. Thomos Lemons DO   Signed by:   D. Thomos Lemons DO on 10/11/2010   Method used:   Print then Give to Patient   RxID:   [id number] LYRICA 50 MG CAPS (PREGABALIN) one by mouth two times a day  #180 x 1   Entered and Authorized by:   D. Thomos Lemons DO   Signed by:   D. Thomos Lemons DO on 10/11/2010   Method used:   Print then Give to Patient   RxID:   [id number] SERTRALINE HCL 100 MG TABS (SERTRALINE HCL) 2 tabs by mouth qd  #180 x 1   Entered and Authorized by:   D. Thomos Lemons DO   Signed by:   D. Thomos Lemons DO on 10/11/2010   Method used:   Print then Give to Patient   RxID:   [id number] AMITIZA 24 MCG CAPS (LUBIPROSTONE) one by mouth two times a day  #60 x 5   Entered and Authorized by:   D. Thomos Lemons DO   Signed by:   D. Thomos Lemons DO on 10/11/2010   Method used:   Electronically to        [facility]* (retail)       [address]       [address] Shipping/mailing       [place], [place]  [address]       Ph: [phone]       Fax: [phone]   RxID:   [id number] OMEPRAZOLE-SODIUM BICARBONATE 40-1100 MG CAPS (OMEPRAZOLE-SODIUM BICARBONATE) one by mouth once daily  #90 x 1    Entered and Authorized by:   D. Thomos Lemons DO   Signed by:   D. Thomos Lemons DO on 10/11/2010   Method used:   Electronically to        [facility]* (retail)  [address]       [address] Shipping/mailing       [place], [place]  [address]       Ph: [phone]       Fax: [phone]   RxID:   [id number]    Orders Added: 1)  T-Lipid Profile [phone] 2)  T-Basic Metabolic Panel [phone] 3)  T- * Misc. Laboratory test [id number] 4)  Gastroenterology Referral [GI] 5)  EKG w/ Interpretation [93000] 6)  Est. Patient age 41-64 [21]       Current Allergies (reviewed today): ! DARVOCET ! TYLOX ! VICODIN ! * IV IRON ! * IVP DYE

## 2011-01-12 NOTE — Progress Notes (Signed)
Summary: blood work results  please fax   Phone Note Call from Patient   Caller: Patient Call For: Xaviera Flaten  Summary of Call: [phone] fax results of the bloodwork she had done   Initial call taken by: Roselle Locus,  December 22, 2010 11:28 AM  Follow-up for Phone Call        ok to fax results Follow-up by: D. Thomos Lemons DO,  December 22, 2010 5:14 PM  Additional Follow-up for Phone Call Additional follow up Details #1::        Results faxed to [phone] per patient request, per Dr Artist Pais okay Additional Follow-up by: Glendell Docker CMA,  December 23, 2010 10:34 AM

## 2011-01-12 NOTE — Assessment & Plan Note (Signed)
Summary: tear duct stopped up/mhf   Vital Signs:  Patient profile:   50 year old female Height:      67 inches Weight:      242.25 pounds BMI:     38.08 O2 Sat:      100 % on Room air Temp:     97.7 degrees F oral Pulse rate:   84 / minute Pulse rhythm:   regular Resp:     18 per minute BP sitting:   132 / 90  (right arm) Cuff size:   large  Vitals Entered By: Glendell Docker CMA (February 22, 2010 1:09 PM)  O2 Flow:  Room air CC: Rm 2- head congestion   Primary Care Provider:  Dondra Spry DO  CC:  Rm 2- head congestion.  History of Present Illness: 50 y/o AA female c/o left eye is irritated.  tear duct clogged?  ears feel full  head and chest congestion  she denies cough sneezing  Allergies: 1)  ! Darvocet 2)  ! Tylox 3)  ! Vicodin 4)  ! * Iv Iron 5)  ! 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 Sharene Skeans ) Bursitis - right shoulder PUD - Dr. Loreta Ave (H. 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   Eyes:  mild redness corner of left eye Ears:  R ear normal and L ear normal.   Mouth:  postnasal drip.   Neck:  supple and no masses.   Lungs:  normal respiratory effort and normal breath sounds.   Heart:  normal rate, regular rhythm, and no gallop.   Extremities:  No lower extremity edema    Impression &  Recommendations:  Problem # 1:  URI (ICD-465.9)  Her updated medication list for this problem includes:    Zyrtec Allergy 10 Mg Tbdp (Cetirizine hcl) ..... One by mouth once daily  Instructed on symptomatic treatment. Call if symptoms persist or worsen.   Problem # 2:  FIBROMYALGIA (ICD-729.1) Assessment: Improved good response to lyrica.    Complete Medication List: 1)  Lorazepam 2 Mg Tabs (Lorazepam) .... One by mouth three times a day 2)  Sertraline Hcl 100 Mg Tabs (Sertraline hcl) .... 2 tabs by mouth qd 3)  Ranitidine Hcl 150 Mg Tabs (Ranitidine hcl) .... One by mouth bid 4)  Phentermine Hcl 37.5 Mg Caps (Phentermine hcl) .... One by mouth once daily 5)  Lyrica 50 Mg Caps (Pregabalin) .... One by mouth two times a day 6)  Zyrtec Allergy 10 Mg Tbdp (Cetirizine hcl) .... One by mouth once daily 7)  Nasonex 50 Mcg/act Susp (Mometasone furoate) .... 2 sprays each nostril once daily 8)  Cefuroxime Axetil 500 Mg Tabs (Cefuroxime axetil) .... One by mouth bid  Patient Instructions: 1)  Use warm tea bag 2-3 x per day to left eye 2)  Call our office if  your symptoms do not  improve or gets worse. 3)  Please schedule a follow-up appointment in 6 months. Prescriptions: LYRICA 50 MG CAPS (PREGABALIN) one by mouth two times a day  #60 x 5   Entered and Authorized by:   D. Thomos Lemons DO   Signed by:   D. Thomos Lemons DO on 02/22/2010   Method used:   Print then Give to Patient   RxID:   [id number] CEFUROXIME AXETIL 500 MG TABS (CEFUROXIME AXETIL) one by mouth bid  #14 x 0   Entered and Authorized by:   D. Thomos Lemons DO   Signed by:   D. Thomos Lemons DO on 02/22/2010   Method used:   Print then Give to Patient   RxID:   [id number] NASONEX 50 MCG/ACT SUSP (MOMETASONE FUROATE) 2 sprays each nostril once daily  #1 x 2   Entered and Authorized by:   D. Thomos Lemons DO   Signed by:   D. Thomos Lemons DO on 02/22/2010   Method used:   Electronically to        [facility]* (retail)       [address]       [address] [place] [address]mailing       [place], [place]  [address]       Ph: [phone]       Fax: [phone]   RxID:   [id number]   Current Allergies (reviewed today): ! DARVOCET ! TYLOX ! VICODIN ! * IV IRON ! * IVP DYE   Preventive Care Screening  Mammogram:    Date:  01/17/2010    Results:  Done

## 2011-01-12 NOTE — Assessment & Plan Note (Signed)
Summary: fu meds/dt   Vital Signs:  Patient profile:   50 year old female Height:      67 inches Weight:      240 pounds BMI:     37.73 O2 Sat:      99 % on Room air Temp:     97.8 degrees F oral Pulse rate:   81 / minute Pulse rhythm:   regular Resp:     18 per minute BP sitting:   116 / 78  (right arm) Cuff size:   large  Vitals Entered By: Glendell Docker CMA (May 17, 2010 2:27 PM)  O2 Flow:  Room air CC: Rm 3- Follow up  Is Patient Diabetic? No Pain Assessment Patient in pain? no        Primary Care Provider:  DThomos Lemons DO  CC:  Rm 3- Follow up .  History of Present Illness:  50 y/o for f/u re:  obesity no side effects for phentermine denies elevate bp, no chest pain wt is stable.  diet is suboptimal.  eats more junk at night she lives with her dog.  she does not want to cook for herself  she c/o chronic constipation no relief with miralax  Allergies: 1)  ! Darvocet 2)  ! Tylox 3)  ! Vicodin 4)  ! * Iv Iron 5)  ! 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 Sharene Skeans )  Bursitis - right shoulder PUD - Dr. Loreta Ave (H. Pylori) Colonoscopy -2000 Chronic Insomnia Migraines Obesity   Family History: Family History Breast cancer 1st degree relative <50 Family History of CAD Female 1st degree relative <60 Family History Diabetes 1st degree relative Family History of Prostate CA 1st degree relative <50         Physical Exam  General:  alert, well-developed, and well-nourished.   Lungs:  normal respiratory effort and normal breath sounds.   Heart:  normal rate, regular rhythm, and no gallop.   Extremities:  No lower extremity edema  Psych:  normally interactive, good eye contact, not anxious appearing, and not depressed appearing.     Impression & Recommendations:  Problem # 1:  OBESITY (ICD-278.00) wt is stable.  she lives  by herself and dog.  hard to cook for herself at night.   we discussed using phentermine short term.  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  Maintain current medication regimen.  Problem # 3:  CONSTIPATION (ICD-564.00) she has chronic constipation.   no improvement with prev trial of miralax. trial of amitiza  Complete Medication List: 1)  Lorazepam 2 Mg Tabs (Lorazepam) .... One by mouth three times a day 2)  Sertraline Hcl 100 Mg Tabs (Sertraline hcl) .... 2 tabs by mouth qd 3)  Ranitidine Hcl 150 Mg Tabs (Ranitidine hcl) .... One by mouth bid 4)  Phentermine Hcl 37.5 Mg Caps (Phentermine hcl) .... One by mouth once daily 5)  Lyrica 50 Mg Caps (Pregabalin) .... One by mouth two times a day 6)  Zyrtec Allergy 10 Mg Tbdp (Cetirizine hcl) .... One by mouth once daily 7)  Nasonex 50 Mcg/act Susp (Mometasone furoate) .... 2 sprays each nostril once daily 8)  Amitiza 8 Mcg Caps (Lubiprostone) .... One by mouth  two times a day  Patient Instructions: 1)  Please schedule a follow-up appointment in 6 months. Prescriptions: NASONEX 50 MCG/ACT SUSP (MOMETASONE FUROATE) 2 sprays each nostril once daily  #1 x 5   Entered and Authorized by:   D. Thomos Lemons DO   Signed by:   D. Thomos Lemons DO on 05/17/2010   Method used:   Print then Give to Patient   RxID:   [id number] LYRICA 50 MG CAPS (PREGABALIN) one by mouth two times a day  #60 x 5   Entered and Authorized by:   D. Thomos Lemons DO   Signed by:   D. Thomos Lemons DO on 05/17/2010   Method used:   Print then Give to Patient   RxID:   [id number] PHENTERMINE HCL 37.5 MG CAPS (PHENTERMINE HCL) one by mouth once daily  #30 x 5   Entered and Authorized by:   D. Thomos Lemons DO   Signed by:   D. Thomos Lemons DO on 05/17/2010   Method used:   Print then Give to Patient   RxID:    [id number] RANITIDINE HCL 150 MG TABS (RANITIDINE HCL) one by mouth bid  #60 x 5   Entered and Authorized by:   D. Thomos Lemons DO   Signed by:   D. Thomos Lemons DO on 05/17/2010   Method used:   Print then Give to Patient   RxID:   [id number] SERTRALINE HCL 100 MG TABS (SERTRALINE HCL) 2 tabs by mouth qd  #60 x 5   Entered and Authorized by:   D. Thomos Lemons DO   Signed by:   D. Thomos Lemons DO on 05/17/2010   Method used:   Print then Give to Patient   RxID:   [id number] LORAZEPAM 2 MG TABS (LORAZEPAM) one by mouth three times a day  #90 x 5   Entered and Authorized by:   D. Thomos Lemons DO   Signed by:   D. Thomos Lemons DO on 05/17/2010   Method used:   Print then Give to Patient   RxID:   [id number]    Preventive Care Screening  Pap Smear:    Date:  05/03/2010    Results:  normal

## 2011-01-12 NOTE — Assessment & Plan Note (Signed)
Summary: 5 DAY FOLLOW UP/MHF   Vital Signs:  Patient profile:   50 year old female Height:      67 inches Weight:      245.50 pounds BMI:     38.59 O2 Sat:      99 % on Room air Temp:     98.1 degrees F oral Pulse rate:   92 / minute Resp:     18 per minute BP sitting:   130 / 80  (right arm) Cuff size:   large  Vitals Entered By: Glendell Docker CMA (December 20, 2010 11:41 AM)  O2 Flow:  Room air CC: follow-up visit Is Patient Diabetic? No Pain Assessment Patient in pain? no        Primary Care Provider:  Dondra Spry DO  CC:  follow-up visit.  History of Present Illness: 50 y/o female f/u follow up re:  rash symptoms significantly improved with solumedrol  pt reports she has gotten unexplained rash for years prev evaluated by Dr. Terri Piedra then derm specialist at [facility] Screening-Counseling [facility] Management  Alcohol-Tobacco     Smoking Status: never  Allergies: 1)  ! Darvocet 2)  ! Tylox 3)  ! Vicodin 4)  ! * Iv Iron 5)  ! * Ivp Dye  Social History: Smoking Status:  never  Physical Exam  General:  alert and overweight-appearing.   Lungs:  Normal respiratory effort, chest expands symmetrically. Lungs are clear to auscultation, no crackles or wheezes. Heart:  normal rate, regular rhythm, and no gallop.   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 One by mouth two times a day x 5 days then one by mouth once daily x 5 days Her updated medication list for this problem includes:    Zyrtec Allergy 10 Mg Tbdp (Cetirizine hcl) ..... One by mouth once daily  Orders: T-Hepatic Function [phone]) T-CBC w/Diff  [phone]) T-TSH [phone]) T-T4, Free [phone]) T- * Misc. Laboratory test [phone]) T- * Misc. Laboratory test [id number])  Complete Medication List: 1)  Lorazepam 2 Mg Tabs (Lorazepam) .... One by mouth three times a day 2)  Sertraline Hcl 100 Mg Tabs (Sertraline hcl) .... 2 tabs by mouth qd 3)  Lyrica 50 Mg Caps (Pregabalin) .... One by mouth two times a day 4)  Zyrtec Allergy 10 Mg Tbdp (Cetirizine hcl) .... One by mouth once daily 5)  Nasonex 50 Mcg/act Susp (Mometasone furoate) .... 2 sprays each nostril once daily 6)  Omeprazole-sodium Bicarbonate 40-1100 Mg Caps (Omeprazole-sodium bicarbonate) .... One by mouth once daily 7)  Amitiza 24 Mcg Caps (Lubiprostone) .... One by mouth two times a day 8)  Crestor 20 Mg Tabs (Rosuvastatin calcium) .... One by mouth qd  Patient Instructions: 1)  Our office will contact you re:  allergy testing results   Orders Added: 1)  T-Hepatic Function [[id number]] 2)  T-CBC w/Diff [[id number]] 3)  T-TSH [[id number]] 4)  T-T4, Free [[id number]] 5)  T- * Misc. Laboratory test [99999] 6)  T- * Misc. Laboratory test [99999] 7)  Est. Patient Level III [51884]    Current Allergies (reviewed today): ! DARVOCET ! TYLOX ! VICODIN ! * IV IRON ! *  IVP DYE

## 2011-01-12 NOTE — Progress Notes (Signed)
Summary: written rx--lorazepam, phentermine  Phone Note Call from Patient   Caller: Patient Call For: [phone] Summary of Call: Pt would like to get written rxs today for Lorazepam and Phentermine. She is at work number until [employer] today. Mervin Kung CMA  Apr 25, 2010 3:31 PM  Initial call taken by: D. Thomos Lemons DO,  Apr 25, 2010 3:44 PM  Follow-up for Phone Call        needs OV within 1-2 months before further refills Follow-up by: D. Thomos Lemons DO,  Apr 25, 2010 3:46 PM  Additional Follow-up for Phone Call Additional follow up Details #1::        Advised pt. that rxs are ready to pick up.  She states she will schedule f/u when she picks up rxs.  Mervin Kung CMA  Apr 25, 2010 3:51 PM     Prescriptions: PHENTERMINE HCL 37.5 MG CAPS (PHENTERMINE HCL) one by mouth once daily  #30 x 2   Entered by:   Mervin Kung CMA   Authorized by:   D. Thomos Lemons DO   Signed by:   Mervin Kung CMA on 04/25/2010   Method used:   Print then Give to Patient   RxID:   [id number] LORAZEPAM 2 MG TABS (LORAZEPAM) one by mouth three times a day  #90 x 0   Entered by:   Mervin Kung CMA   Authorized by:   D. Thomos Lemons DO   Signed by:   Mervin Kung CMA on 04/25/2010   Method used:   Print then Give to Patient   RxID:   [id number]

## 2011-01-12 NOTE — Progress Notes (Signed)
Summary: Lab Results  Phone Note Outgoing Call   Summary of Call: call pt - cholesterol level elevated.   bad cholesterol 174.  I rec pt start cholesterol medication.  see rx for crestor.  needs flp, ast, alt in 2 months then fov  pt can pick up samples of crestor (2 week supply) to try first Initial call taken by: D. Thomos Lemons DO,  October 22, 2010 4:37 PM  Follow-up for Phone Call        call placed to patient at [phone],no answer. A detailed voice message was left informing patient per Dr Artist Pais istructions. She was advised to call if any questions. Samples left at front desk for patient pick up. Fasting blood work entered for January 2012 Follow-up by: Glendell Docker CMA,  October 24, 2010 12:49 PM    New/Updated Medications: CRESTOR 20 MG TABS (ROSUVASTATIN CALCIUM) one by mouth qd Prescriptions: CRESTOR 20 MG TABS (ROSUVASTATIN CALCIUM) one by mouth qd  #30 x 3   Entered and Authorized by:   D. Thomos Lemons DO   Signed by:   D. Thomos Lemons DO on 10/22/2010   Method used:   Electronically to        [facility]* (retail)       [address]       [address] [employer]       [place], [place]  [address]       Ph: [phone]       Fax: [phone]   RxID:   [id number]

## 2011-01-24 ENCOUNTER — Ambulatory Visit (HOSPITAL_COMMUNITY): Payer: Self-pay

## 2011-01-24 ENCOUNTER — Ambulatory Visit (HOSPITAL_COMMUNITY): Admission: RE | Admit: 2011-01-24 | Payer: Self-pay | Source: Home / Self Care | Admitting: Obstetrics and Gynecology

## 2011-01-26 ENCOUNTER — Ambulatory Visit (HOSPITAL_COMMUNITY)
Admission: RE | Admit: 2011-01-26 | Payer: Self-pay | Source: Ambulatory Visit | Attending: *Deleted | Admitting: *Deleted

## 2011-01-31 ENCOUNTER — Ambulatory Visit (HOSPITAL_COMMUNITY): Payer: Self-pay | Attending: *Deleted

## 2011-04-28 ENCOUNTER — Other Ambulatory Visit: Payer: Self-pay | Admitting: Internal Medicine

## 2011-04-28 NOTE — Telephone Encounter (Signed)
Pt last seen 12/20/10, has no future appts on file. Please advise regarding refills.

## 2011-04-28 NOTE — Procedures (Signed)
EEG [id number]   CLINICAL HISTORY:  The patient is a 50 year old woman who had two episodes  of seizure-like activity, the first one three weeks ago, last on November 2.  The patient has a history of hypertension.  This study is being done to look  for the presence of seizures.   PROCEDURE:  The procedure was carried out on a 32-channel digital Cadwell  recorder reformatted in 16-channel montages with one devoted to EKG.  The  patient was awake during the recording and drowsy.  The International 10-20  system lead placement was used.   MEDICATIONS:  Ativan, Dilantin, and fencarbamide.   DESCRIPTION OF FINDINGS:  Dominant frequency is a 8 to 9 Hz, 15 to 30 mV  activity that is well regulated and attenuates partially with eye opening.  Background activity is predominantly beta range activity with superimposed  very low-voltage alpha range components and occasional beta range activity  superimposed in the temporal, central, and posterior regions.   There was no focal slowing in the background.  There was no inter-ictal  epileptiform activity in the form of spikes or sharp waves.  Intermittent  photic stimulation failed to induce a definite driving response.  Hyperventilation was carried out and caused little change in background.  EKG showed a regular sinus rhythm with ventricular response of 90 beats per  minute.   IMPRESSION:  Normal waking record.      ZOX:WRUE  D:  10/21/2004 14:56:54  T:  10/22/2004 17:05:53  Job #:  [id number]
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
## 2011-05-02 ENCOUNTER — Other Ambulatory Visit: Payer: Self-pay | Admitting: Internal Medicine

## 2011-05-03 ENCOUNTER — Telehealth: Payer: Self-pay | Admitting: Internal Medicine

## 2011-05-03 MED ORDER — LUBIPROSTONE 24 MCG PO CAPS: 24.0000 ug | ORAL_CAPSULE | Freq: Two times a day (BID) | ORAL | Status: DC

## 2011-05-03 MED ORDER — LORAZEPAM 2 MG PO TABS: 2.0000 mg | ORAL_TABLET | Freq: Three times a day (TID) | ORAL | Status: DC

## 2011-05-03 NOTE — Telephone Encounter (Signed)
Rx refill sent/phoned to [facility] pharmacy.

## 2011-05-03 NOTE — Telephone Encounter (Signed)
amitiza 24 mcg twice a day  Lorazepam 2 mg 3 times per day   She is out  Please call in to pharmacy today

## 2011-05-03 NOTE — Telephone Encounter (Signed)
Rx refill already submitted to pharmacy

## 2011-05-29 ENCOUNTER — Other Ambulatory Visit: Payer: Self-pay | Admitting: Internal Medicine

## 2011-05-29 NOTE — Telephone Encounter (Signed)
Rx refill for Omeprazole approved,pharmacy advised to have patient contact office regarding Rx for Cyclobenzaprine

## 2011-06-16 ENCOUNTER — Encounter: Payer: Self-pay | Admitting: Internal Medicine

## 2011-06-20 ENCOUNTER — Encounter: Payer: Self-pay | Admitting: Family Medicine

## 2011-06-20 ENCOUNTER — Ambulatory Visit: Payer: Self-pay | Admitting: Family

## 2011-06-20 ENCOUNTER — Ambulatory Visit (INDEPENDENT_AMBULATORY_CARE_PROVIDER_SITE_OTHER): Payer: 59 | Admitting: Family Medicine

## 2011-06-20 VITALS — BP 124/86 | HR 102 | Ht 67.0 in

## 2011-06-20 DIAGNOSIS — E785 Hyperlipidemia, unspecified: Secondary | ICD-10-CM

## 2011-06-20 MED ORDER — COLESEVELAM HCL 625 MG PO TABS: 1875.0000 mg | ORAL_TABLET | Freq: Two times a day (BID) | ORAL | Status: DC

## 2011-06-20 NOTE — Assessment & Plan Note (Signed)
Vytorin caused myalgias in the past. Crestor: ? Lichen planus.  Trial of welchol 3 tabs bid: samples and rx given.  Therapeutic expectations and side effect profile of medication discussed today.  Patient's questions answered.  F/u 69mo with o/v--lipid panel prior.

## 2011-06-20 NOTE — Progress Notes (Signed)
OFFICE NOTE  06/20/2011  CC:  Chief Complaint  Patient presents with  . Hyperlipidemia    quit crestor d/t lichen on foot     HPI:   Patient is a 50 y.o. African-American female who is here for f/u hyperlipidemia. Was put on crestor after 10/2010 lipid panel showed high LDL.  Shortly after starting this she developed a painful rash on right lower leg, dx'd with lichen planus by derm and crestor and lyrica were both implicated as possible medication causes so both meds were d/c'd. Rash improving some with topical med from derm.    Pertinent PMH:  Hyperlipidemia Fibromyalgia Constipation Allergic rhinitis Obesity Lichen planus: right lower leg  MEDS;   Outpatient Prescriptions Prior to Visit  Medication Sig Dispense Refill  . cetirizine (ZYRTEC) 10 MG tablet Take 10 mg by mouth daily.        Marland Kitchen LORazepam (ATIVAN) 2 MG tablet Take 1 tablet (2 mg total) by mouth 3 (three) times daily.  90 tablet  2  . lubiprostone (AMITIZA) 24 MCG capsule Take 1 capsule (24 mcg total) by mouth 2 (two) times daily with a meal.  60 capsule  3  . mometasone (NASONEX) 50 MCG/ACT nasal spray 2 sprays by Nasal route daily.        Marland Kitchen omeprazole-sodium bicarbonate (ZEGERID) 40-1100 MG per capsule TAKE 1 CAPSULE BY MOUTH ONCE DAILY  90 capsule  0  . sertraline (ZOLOFT) 100 MG tablet Take 100 mg by mouth 2 (two) times daily.       . rosuvastatin (CRESTOR) 20 MG tablet Take 20 mg by mouth daily.          PE: Blood pressure 124/86, pulse 102, height 5\' 7"  (1.702 m), SpO2 100.00%. Gen: Alert, well appearing.  Patient is oriented to person, place, time, and situation. Neck: supple, ROM full.  Carotids 2+ bilat, without bruit.  No lymphadenopathy, thyromegaly, or mass. Chest: symmetric expansion, nonlabored respirations.  Clear and equal breath sounds in all lung fields.   CV: RRR, no m/r/g.  Peripheral pulses 2+ and symmetric. EXT: no clubbing, cyanosis, or edema.  SKIN: top of right foot and lateral portion  of right lower leg has pigmented plaques coalesced together.  IMPRESSION AND PLAN:  No problem-specific assessment & plan notes found for this encounter.   FOLLOW UP:  Return in about 3 months (around 09/20/2011) for f/u hyperlipidemia.  Fasting lipid panel the week prior to your appointment.Marland Kitchen

## 2011-06-28 ENCOUNTER — Other Ambulatory Visit: Payer: Self-pay | Admitting: Internal Medicine

## 2011-06-28 NOTE — Telephone Encounter (Signed)
Pt last seen 06/20/11. Due for f/u 10/12. Please advise re: refill and quantity.

## 2011-06-29 ENCOUNTER — Other Ambulatory Visit: Payer: Self-pay | Admitting: *Deleted

## 2011-06-29 NOTE — Telephone Encounter (Signed)
Received call from pt requesting refill for cyclobenzaprine 10mg  1 tablet every hours as needed for fibromyalgia. Dr Artist Pais originally prescribed it on 02/21/11 and was last filled on 05/03/11 per pharmacy. Please advise re: refills.

## 2011-06-29 NOTE — Telephone Encounter (Signed)
RF3

## 2011-06-29 NOTE — Telephone Encounter (Signed)
Refill called to [facility] for # 90 x 2 refills. Directions below should have read 1 tablet every 8 hours as needed.

## 2011-06-30 NOTE — Telephone Encounter (Signed)
I do not see that Dr. Artist Pais has ever prescribed this medication. Should go back to there original provider.

## 2011-07-04 NOTE — Telephone Encounter (Signed)
Pt seen 06/20/2011 by Dr Earley Favor

## 2011-07-04 NOTE — Telephone Encounter (Signed)
Call placed to patient [phone], she states he Rx request for Flexeril has been addressed. No further action needed

## 2011-07-07 ENCOUNTER — Encounter: Payer: Self-pay | Admitting: Internal Medicine

## 2011-07-13 ENCOUNTER — Ambulatory Visit (INDEPENDENT_AMBULATORY_CARE_PROVIDER_SITE_OTHER): Payer: 59 | Admitting: Internal Medicine

## 2011-07-13 ENCOUNTER — Encounter: Payer: Self-pay | Admitting: Internal Medicine

## 2011-07-13 VITALS — BP 138/100 | HR 90 | Temp 98.1°F | Resp 16

## 2011-07-13 DIAGNOSIS — J329 Chronic sinusitis, unspecified: Secondary | ICD-10-CM

## 2011-07-13 MED ORDER — AMOXICILLIN-POT CLAVULANATE 875-125 MG PO TABS: 1.0000 | ORAL_TABLET | Freq: Two times a day (BID) | ORAL | Status: AC

## 2011-07-13 MED ORDER — NEOMYCIN-POLYMYXIN-HC 3.5-10000-1 OT SOLN: 3.0000 [drp] | Freq: Three times a day (TID) | OTIC | Status: AC

## 2011-07-16 DIAGNOSIS — J329 Chronic sinusitis, unspecified: Secondary | ICD-10-CM | POA: Insufficient documentation

## 2011-07-16 NOTE — Progress Notes (Signed)
  Subjective:    Patient ID: Heather Hanson, female    DOB: 07/25/1961, 50 y.o.   MRN: [id number]  HPI Pt presents to clinic for evaluation of possible sinusitis. Notes one week h/o left ear and left facial pain. Paranasal and maxillary sinuses tender to the touch. Denies f/c. No ear drainage but was told had small amount of dried blood in canal and has used qtips.  No loss of hearing. No other alleviating or exacerbating factors. No other complaints.  Reviewed pmh, medications and allergies.    Review of Systems see hpi     Objective:   Physical Exam  Nursing note and vitals reviewed. Constitutional: She appears well-developed and well-nourished. No distress.  HENT:  Head: Normocephalic and atraumatic.  Right Ear: External ear normal.  Left Ear: External ear normal.  Nose: Nose normal.  Mouth/Throat: Oropharynx is clear and moist. No oropharyngeal exudate.       Left ear canal with mild erythema and minimal amount of dried blood. TM nl. + mild tenderness along left maxillary sinus and left frontal sinus.  Eyes: Conjunctivae are normal. Right eye exhibits no discharge. Left eye exhibits no discharge. No scleral icterus.  Neck: Neck supple.  Neurological: She is alert.  Skin: Skin is warm and dry. She is not diaphoretic.  Psychiatric: She has a normal mood and affect.          Assessment & Plan:

## 2011-07-16 NOTE — Assessment & Plan Note (Signed)
Begin augmentin x 10 days. Sample of nasonex provided. followup if no improvement or worsening.

## 2011-07-26 ENCOUNTER — Telehealth: Payer: Self-pay | Admitting: Internal Medicine

## 2011-07-26 MED ORDER — BENZONATATE 100 MG PO CAPS: 100.0000 mg | ORAL_CAPSULE | Freq: Two times a day (BID) | ORAL | Status: DC | PRN

## 2011-07-26 MED ORDER — SERTRALINE HCL 100 MG PO TABS: 100.0000 mg | ORAL_TABLET | Freq: Two times a day (BID) | ORAL | Status: DC

## 2011-07-26 NOTE — Telephone Encounter (Signed)
Refill-sertraline hcl 100mg  tab. Take two tablets by mouth every day. Qty 180. Last fill 3.13.12

## 2011-07-26 NOTE — Telephone Encounter (Signed)
Call placed to patient at [phone], no answer, no voice mail.  Call placed to patient at  [phone], no answer. A detailed voice message was left for patient to return call regarding cough. She was asked to inform how long she has had the cough, if it productive/non-productive and if there are any other symptoms.

## 2011-07-26 NOTE — Telephone Encounter (Signed)
Ok to rf #30 however is a cough med and if is having true dyspnea then needs to be evaluated

## 2011-07-26 NOTE — Telephone Encounter (Signed)
Call placed to patient [phone], no answer. A detailed voice message was left informing patient Rx sent to pharmacy, and if she is experiencing shortness of breath per Dr Rodena Medin she would need to schedule for evaluation.

## 2011-07-26 NOTE — Telephone Encounter (Signed)
Rx refill sent to pharmacy. 

## 2011-07-26 NOTE — Telephone Encounter (Signed)
Patient returned call. She is requesting refill due to shortness of breath. She denies having cough. She stated she uses the medication as needed.

## 2011-07-26 NOTE — Telephone Encounter (Signed)
Refill- benzonatate 100mg  capsule. Take 1 to 2 capsules by mouth every 12 hours as needed for cough. Qty 60. Last fill 5.18.12

## 2011-08-24 ENCOUNTER — Telehealth: Payer: Self-pay | Admitting: Internal Medicine

## 2011-08-24 ENCOUNTER — Other Ambulatory Visit: Payer: Self-pay | Admitting: Internal Medicine

## 2011-08-24 MED ORDER — LORAZEPAM 2 MG PO TABS: 2.0000 mg | ORAL_TABLET | Freq: Three times a day (TID) | ORAL | Status: DC

## 2011-08-24 NOTE — Telephone Encounter (Signed)
Rx refill denied office visit is needed for refill on Tessalon pearls

## 2011-08-24 NOTE — Telephone Encounter (Signed)
Rx called to pharmacy with 1 refill per Dr Rodena Medin ok.

## 2011-08-24 NOTE — Telephone Encounter (Signed)
Patient requesting Ativan refill to be sent [facility]

## 2011-09-11 ENCOUNTER — Telehealth: Payer: Self-pay | Admitting: Internal Medicine

## 2011-09-11 DIAGNOSIS — Z0000 Encounter for general adult medical examination without abnormal findings: Secondary | ICD-10-CM

## 2011-09-11 NOTE — Telephone Encounter (Signed)
She needs cpe labs week prior to 10 16 12  and she wants ana and ra  added to whatever dr hodgin wants.

## 2011-09-11 NOTE — Telephone Encounter (Signed)
There are no future labs in place.

## 2011-09-12 NOTE — Telephone Encounter (Signed)
Cbc, chem7, a1c, lipid, lft, tsh, ua v70.0. Need to know what's concerning her for the ANA and rheumatoid factor

## 2011-09-12 NOTE — Telephone Encounter (Signed)
Call placed to patient at [phone], she stated she was requesting the tests to see how her fibromyalgia was progressing,because she has not felt any better. She is also wanting to know if she could have a hepatitis c added. She states that she has a rash on the bottom of her foot that is not resolved.  Call returned to patient at number noted above. She was advised the additional labs would not be added per Dr Rodena Medin instruction. They will be discussed at office visit. Patient informed of test that will be ordered and has verbalized understanding.

## 2011-09-13 NOTE — Telephone Encounter (Signed)
Can tubes be drawn and held for ana, esr, rf and hep c ab?

## 2011-09-19 ENCOUNTER — Other Ambulatory Visit: Payer: Self-pay | Admitting: Internal Medicine

## 2011-09-19 NOTE — Telephone Encounter (Signed)
Dr Rodena Medin is it okay to refill Cyclobenzaprine for this patient?

## 2011-09-19 NOTE — Telephone Encounter (Signed)
Rx refill sent to pharmacy. 

## 2011-09-19 NOTE — Telephone Encounter (Signed)
#  90  rf 1 

## 2011-09-25 ENCOUNTER — Telehealth: Payer: Self-pay | Admitting: Internal Medicine

## 2011-09-25 LAB — URINALYSIS, ROUTINE W REFLEX MICROSCOPIC
Leukocytes, UA: NEGATIVE
Nitrite: NEGATIVE
Specific Gravity, Urine: 1.022 (ref 1.005–1.030)
pH: 6.5 (ref 5.0–8.0)

## 2011-09-25 LAB — CBC
HCT: 41.2 % (ref 36.0–46.0)
MCV: 71.2 fL — ABNORMAL LOW (ref 78.0–100.0)
RDW: 15.8 % — ABNORMAL HIGH (ref 11.5–15.5)
WBC: 5.7 10*3/uL (ref 4.0–10.5)

## 2011-09-25 LAB — HEMOGLOBIN A1C: Mean Plasma Glucose: 114 mg/dL (ref <117)

## 2011-09-25 NOTE — Telephone Encounter (Signed)
PATIENT CALLED STATING [facility] IS BILLING HER FOR HER LAB WORK.  SHE CALLED AND THEY ADVISED SHE NEEDS THE FOLLOWING CODES ENTERED ON HER ORDER IN ORDER FOR HER INSURANCE TO PAY 282.49 V58.69 272.0 V76.46 597.0

## 2011-09-25 NOTE — Telephone Encounter (Signed)
What is going on? i won't be told by a lab how to code the orders as they have don't have access to her records and therefore may inadvertantly recommend codes that would constitute insurance fraud. Secondly i was informed that she was scheduled for a physical which would be v70.0. If a physical is not covered by her insurance company i would suggest not scheduling a physical and coming for a followup. Thirdly if this has anything to do with her request to have additional unrelated labwork added for "fibromyalgia" (hep c, esr, rf) then i suggested holding the blood for those labs until i could talk with her as none of them would/should be covered for a physical or fibromyalgia

## 2011-09-25 NOTE — Telephone Encounter (Signed)
Call received from Katrina with [employer]. Patient present to lab for physical blood work. She was made aware of codes associated with physical labs. She has requested additional coding be placed for billing purposes. Katrina called for approval of additional diagnoses. She was informed provider is out of office today. Patient is scheduled with provider for physical appointment on 09/26/2011.

## 2011-09-26 ENCOUNTER — Other Ambulatory Visit: Payer: Self-pay | Admitting: Internal Medicine

## 2011-09-26 ENCOUNTER — Ambulatory Visit (INDEPENDENT_AMBULATORY_CARE_PROVIDER_SITE_OTHER): Payer: 59 | Admitting: Internal Medicine

## 2011-09-26 ENCOUNTER — Encounter: Payer: Self-pay | Admitting: Internal Medicine

## 2011-09-26 DIAGNOSIS — Z1239 Encounter for other screening for malignant neoplasm of breast: Secondary | ICD-10-CM

## 2011-09-26 DIAGNOSIS — Z0000 Encounter for general adult medical examination without abnormal findings: Secondary | ICD-10-CM

## 2011-09-26 DIAGNOSIS — R718 Other abnormality of red blood cells: Secondary | ICD-10-CM

## 2011-09-26 DIAGNOSIS — L439 Lichen planus, unspecified: Secondary | ICD-10-CM

## 2011-09-26 DIAGNOSIS — E785 Hyperlipidemia, unspecified: Secondary | ICD-10-CM

## 2011-09-26 DIAGNOSIS — M2550 Pain in unspecified joint: Secondary | ICD-10-CM

## 2011-09-26 LAB — HEPATIC FUNCTION PANEL
Albumin: 4.5 g/dL (ref 3.5–5.2)
Total Bilirubin: 0.4 mg/dL (ref 0.3–1.2)

## 2011-09-26 LAB — TSH: TSH: 2.918 u[IU]/mL (ref 0.350–4.500)

## 2011-09-26 LAB — BASIC METABOLIC PANEL
BUN: 11 mg/dL (ref 6–23)
CO2: 27 mEq/L (ref 19–32)
Chloride: 99 mEq/L (ref 96–112)
Creat: 1.03 mg/dL (ref 0.50–1.10)

## 2011-09-26 LAB — LIPID PANEL
LDL Cholesterol: 184 mg/dL — ABNORMAL HIGH (ref 0–99)
Total CHOL/HDL Ratio: 4.3 Ratio
VLDL: 27 mg/dL (ref 0–40)

## 2011-09-26 NOTE — Patient Instructions (Signed)
Please schedule screening mammogram

## 2011-09-27 LAB — ANA: Anti Nuclear Antibody(ANA): POSITIVE — AB

## 2011-09-28 ENCOUNTER — Other Ambulatory Visit: Payer: Self-pay | Admitting: Internal Medicine

## 2011-09-29 LAB — HEMOGLOBINOPATHY EVALUATION
Hemoglobin Other: 0 % (ref 0.0–0.0)
Hgb A2 Quant: 2.1 % — ABNORMAL LOW (ref 2.2–3.2)
Hgb A: 97.9 % — ABNORMAL HIGH (ref 96.8–97.8)
Hgb S Quant: 0 % (ref 0.0–0.0)

## 2011-09-29 NOTE — Telephone Encounter (Signed)
Verbal refill order provided to [facility] at [facility]

## 2011-09-30 DIAGNOSIS — M2550 Pain in unspecified joint: Secondary | ICD-10-CM | POA: Insufficient documentation

## 2011-09-30 DIAGNOSIS — L439 Lichen planus, unspecified: Secondary | ICD-10-CM | POA: Insufficient documentation

## 2011-09-30 DIAGNOSIS — R718 Other abnormality of red blood cells: Secondary | ICD-10-CM | POA: Insufficient documentation

## 2011-09-30 DIAGNOSIS — Z0000 Encounter for general adult medical examination without abnormal findings: Secondary | ICD-10-CM | POA: Insufficient documentation

## 2011-09-30 NOTE — Progress Notes (Signed)
  Subjective:    Patient ID: Heather Hanson, female    DOB: 1961-06-02, 50 y.o.   MRN: [id number]  HPI Pt presents to clinic for annual physical. H/o lichen planus involving right foot s/d dermatology evaluation. No improvement. No obvious trigger identified.wishes to be tested for hep c. C/o diffuse arthralgias and concerned over possible inflammatory etiologies. ldl reviewed high and pt states cannot tolerate statin tx including crestor and lipitor. H/o microcytosis with nl iron. Has been told in past may have thalasemia. No other complaints. Pap smear utd.    Review of Systems see hpi     Objective:   Physical Exam  Nursing note and vitals reviewed. Constitutional: She appears well-developed and well-nourished. No distress.  HENT:  Head: Normocephalic and atraumatic.  Right Ear: Tympanic membrane, external ear and ear canal normal.  Left Ear: Tympanic membrane, external ear and ear canal normal.  Nose: Nose normal.  Mouth/Throat: Oropharynx is clear and moist. No oropharyngeal exudate.  Eyes: Conjunctivae are normal. Pupils are equal, round, and reactive to light. Right eye exhibits no discharge. Left eye exhibits no discharge. No scleral icterus.  Neck: Neck supple. Carotid bruit is not present.  Cardiovascular: Normal rate, regular rhythm and normal heart sounds.  Exam reveals no gallop and no friction rub.   No murmur heard. Pulmonary/Chest: Effort normal and breath sounds normal. No respiratory distress. She has no wheezes. She has no rales.  Abdominal: Soft. Bowel sounds are normal. She exhibits no distension. There is no tenderness. There is no rebound and no guarding.  Lymphadenopathy:    She has no cervical adenopathy.  Neurological: She is alert.  Skin: Skin is warm and dry. She is not diaphoretic.  Psychiatric: She has a normal mood and affect.          Assessment & Plan:

## 2011-09-30 NOTE — Assessment & Plan Note (Signed)
Obtain uric acid, ana, esr, rf.

## 2011-09-30 NOTE — Assessment & Plan Note (Signed)
Obtain hgb electropheresis

## 2011-09-30 NOTE — Assessment & Plan Note (Signed)
Obtain hep c ab.

## 2011-09-30 NOTE — Assessment & Plan Note (Signed)
Reviewed cpe labs. Schedule mammogram. Colonoscopy and pap utd.

## 2011-10-02 NOTE — Telephone Encounter (Signed)
No diagnosis code changes required at time of phone.Lab codes entered for visit appropriately. Additional labs were drawn at office visit.

## 2011-10-03 ENCOUNTER — Telehealth: Payer: Self-pay | Admitting: Internal Medicine

## 2011-10-03 MED ORDER — PHENTERMINE HCL 15 MG PO CAPS: 15.0000 mg | ORAL_CAPSULE | ORAL | Status: AC

## 2011-10-03 MED ORDER — TOPIRAMATE 25 MG PO TABS: ORAL_TABLET | ORAL | Status: DC

## 2011-10-03 NOTE — Telephone Encounter (Signed)
It's a combination of topamax and phentermine but it is extended release so the dosing is different. Closest we can come to it is phentermine 15mg  po qd #30 and topamax 25mg  po qd x 1 week then 25mg  bid. Need to monitor bp and hr. Also stop if have any strange thoughts.

## 2011-10-03 NOTE — Telephone Encounter (Signed)
Call received from [facility] regarding requested Rx. He advised Rx is only available form manufacturer web site for mail order. The current dosing is not available at pharmacy. He stated the combination was Phentermine and Topamax.

## 2011-10-03 NOTE — Telephone Encounter (Signed)
Call placed to patient [phone], she was advised per Dr Rodena Medin instruction,has verbalized understanding and agrees as instructed.   Rx printed and faxed to [facility].

## 2011-10-03 NOTE — Telephone Encounter (Signed)
She talked to the pharmacy and they told her she needs a script for the qsymia as they do carry it at the [facility]

## 2011-10-04 ENCOUNTER — Ambulatory Visit (HOSPITAL_BASED_OUTPATIENT_CLINIC_OR_DEPARTMENT_OTHER)
Admission: RE | Admit: 2011-10-04 | Discharge: 2011-10-04 | Disposition: A | Discharge status: Home / Self Care | Payer: 59 | Source: Ambulatory Visit | Attending: Internal Medicine | Admitting: Internal Medicine

## 2011-10-04 DIAGNOSIS — Z1239 Encounter for other screening for malignant neoplasm of breast: Secondary | ICD-10-CM

## 2011-10-04 DIAGNOSIS — Z1231 Encounter for screening mammogram for malignant neoplasm of breast: Secondary | ICD-10-CM

## 2011-10-20 ENCOUNTER — Other Ambulatory Visit: Payer: Self-pay | Admitting: Internal Medicine

## 2011-10-20 NOTE — Telephone Encounter (Signed)
Call placed to [facility] pharmacy, verbal refill order provided to [facility].

## 2011-10-20 NOTE — Telephone Encounter (Signed)
90

## 2011-10-20 NOTE — Telephone Encounter (Signed)
Dr Rodena Medin, is it okay to provide refills on the Alprazolam?

## 2011-11-13 ENCOUNTER — Telehealth: Payer: Self-pay | Admitting: Internal Medicine

## 2011-11-13 NOTE — Telephone Encounter (Signed)
Is it okay to refill this medication for patient? 

## 2011-11-13 NOTE — Telephone Encounter (Signed)
Refill-lorazepam 2mg  tablet. Take one tablet by mouth three times daily. Qty 90 last fill 11.9.12

## 2011-11-13 NOTE — Telephone Encounter (Signed)
Shouldn't be out. Fill friday

## 2011-11-14 ENCOUNTER — Other Ambulatory Visit: Payer: Self-pay | Admitting: Internal Medicine

## 2011-11-14 ENCOUNTER — Telehealth: Payer: Self-pay | Admitting: Internal Medicine

## 2011-11-14 DIAGNOSIS — R768 Other specified abnormal immunological findings in serum: Secondary | ICD-10-CM

## 2011-11-14 NOTE — Telephone Encounter (Signed)
Patient wants a referral to Rhematologist   [place] Spaniel this was discussed at last visit

## 2011-11-15 MED ORDER — LORAZEPAM 2 MG PO TABS: 2.0000 mg | ORAL_TABLET | Freq: Every day | ORAL | Status: DC | PRN

## 2011-11-15 NOTE — Telephone Encounter (Signed)
Verbal refill order provided.

## 2011-11-16 ENCOUNTER — Other Ambulatory Visit: Payer: Self-pay | Admitting: Internal Medicine

## 2011-11-16 NOTE — Telephone Encounter (Signed)
Verbal order provided to pharmacy to [facility] to refill for patient on Friday 11/17/2011 per Dr Rodena Medin.

## 2011-11-17 ENCOUNTER — Other Ambulatory Visit: Payer: Self-pay | Admitting: Internal Medicine

## 2011-11-17 NOTE — Telephone Encounter (Signed)
Rx refill sent to pharmacy. 

## 2011-12-15 ENCOUNTER — Other Ambulatory Visit: Payer: Self-pay | Admitting: Internal Medicine

## 2011-12-18 ENCOUNTER — Other Ambulatory Visit: Payer: Self-pay | Admitting: Internal Medicine

## 2011-12-18 ENCOUNTER — Telehealth: Payer: Self-pay | Admitting: Internal Medicine

## 2011-12-18 MED ORDER — LORAZEPAM 2 MG PO TABS: 2.0000 mg | ORAL_TABLET | Freq: Three times a day (TID) | ORAL | Status: DC | PRN

## 2011-12-18 NOTE — Telephone Encounter (Signed)
Call placed to patient she was asked about intake of medication. Last Rx provided was for qty of 90 with no refills. She states that she is taking the medication up to three times per day. She would like to know if she could have additional refills.

## 2011-12-18 NOTE — Telephone Encounter (Signed)
90 with 3

## 2011-12-18 NOTE — Telephone Encounter (Signed)
Verbal refill order provided to [facility].

## 2011-12-18 NOTE — Telephone Encounter (Signed)
ok 

## 2011-12-18 NOTE — Telephone Encounter (Signed)
Refill sent to pharmacy.   

## 2012-01-15 ENCOUNTER — Other Ambulatory Visit: Payer: Self-pay | Admitting: Internal Medicine

## 2012-01-15 NOTE — Telephone Encounter (Signed)
Please advise; re- refills

## 2012-01-16 NOTE — Telephone Encounter (Signed)
#  90  rf 1 

## 2012-01-18 NOTE — Telephone Encounter (Signed)
Refill left on pharmacy voicemail. 

## 2012-01-23 ENCOUNTER — Encounter: Payer: Self-pay | Admitting: Internal Medicine

## 2012-01-23 ENCOUNTER — Ambulatory Visit (INDEPENDENT_AMBULATORY_CARE_PROVIDER_SITE_OTHER): Payer: 59 | Admitting: Internal Medicine

## 2012-01-23 VITALS — BP 124/84 | HR 94 | Temp 98.1°F | Resp 18 | Wt 252.0 lb

## 2012-01-23 DIAGNOSIS — M549 Dorsalgia, unspecified: Secondary | ICD-10-CM

## 2012-01-23 MED ORDER — KETOROLAC TROMETHAMINE 30 MG/ML IJ SOLN
30.0000 mg | Freq: Once | INTRAMUSCULAR | Status: AC
  Administered 2012-01-23: 30 mg via INTRAMUSCULAR

## 2012-01-24 ENCOUNTER — Encounter: Payer: Self-pay | Admitting: Internal Medicine

## 2012-01-25 ENCOUNTER — Other Ambulatory Visit: Payer: Self-pay | Admitting: *Deleted

## 2012-01-25 MED ORDER — MELOXICAM 7.5 MG PO TABS: 7.5000 mg | ORAL_TABLET | Freq: Every day | ORAL | Status: DC

## 2012-01-27 DIAGNOSIS — M549 Dorsalgia, unspecified: Secondary | ICD-10-CM | POA: Insufficient documentation

## 2012-01-27 NOTE — Assessment & Plan Note (Signed)
Pt not forthcoming about chronic narcotic use. Will not assume narcotic prescribing. She may continue to address this with her outside physician or be referred to pain clinic. Do not recommend demerol injxn. Administered toradol injxn.

## 2012-01-27 NOTE — Progress Notes (Signed)
  Subjective:    Patient ID: Heather Hanson, female    DOB: 11-30-1961, 51 y.o.   MRN: [id number]  HPI Pt presents to clinic for followup of multiple medical problems. H/o ANA + s/p rheumatology evaluation. States was told had fibromyalgia. Was placed on cymbalta in place of zoloft. Notes improved mood. [place] narcotic database reviewed. Takes chronic narcotics from outside physician. Not previously noted by pt on medication list. Once acknowledged pt states takes it for chronic back pain then requests repeatedly that I prescribe her narcotics. Specifically asks for demerol injection.   Past Medical History  Diagnosis Date  . Anemia     thalassemia  . Depression   . Hyperlipidemia   . Seizure disorder     presumed secondary to ativan withdrawal -Dr Sharene Skeans  . Bursitis of right shoulder   . PUD (peptic ulcer disease)     Dr Loreta Ave ( H. Pylori)  . History of colonoscopy 2000  . Chronic insomnia   . Migraines   . Obesity    Past Surgical History  Procedure Date  . Tonsillectomy 1972  . Abdominal hysterectomy 2000    Left ovary and tube intact    reports that she has never smoked. She has never used smokeless tobacco. She reports that she drinks alcohol. She reports that she does not use illicit drugs. family history includes Cancer in an unspecified family member; Coronary artery disease in an unspecified family member; and Diabetes in an unspecified family member. Allergies  Allergen Reactions  . Hydrocodone-Acetaminophen     REACTION: Rash  . Oxycodone-Acetaminophen     REACTION: Rash  . Propoxyphene N-Acetaminophen     REACTION: rash      Review of Systems see hpi     Objective:   Physical Exam  Physical Exam  Nursing note and vitals reviewed. Constitutional: Appears well-developed and well-nourished. No distress.  HENT:  Head: Normocephalic and atraumatic.  Right Ear: External ear normal.  Left Ear: External ear normal.  Eyes: Conjunctivae are normal. No scleral  icterus.  Neck: Neck supple. Carotid bruit is not present.  Cardiovascular: Normal rate, regular rhythm and normal heart sounds.  Exam reveals no gallop and no friction rub.   No murmur heard. Pulmonary/Chest: Effort normal and breath sounds normal. No respiratory distress. He has no wheezes. no rales.  Lymphadenopathy:    He has no cervical adenopathy.  Neurological:Alert.  Skin: Skin is warm and dry. Not diaphoretic.  Psychiatric: Has a normal mood and affect.        Assessment & Plan:

## 2012-02-15 ENCOUNTER — Telehealth: Payer: Self-pay | Admitting: Internal Medicine

## 2012-02-15 MED ORDER — BUPROPION HCL ER (SR) 150 MG PO TB12: 150.0000 mg | ORAL_TABLET | Freq: Two times a day (BID) | ORAL | Status: DC

## 2012-02-15 NOTE — Telephone Encounter (Signed)
#  60 rf 5

## 2012-02-15 NOTE — Telephone Encounter (Signed)
She was taking Zoloft.  Dr Dierdre Forth took her off the Zoloft.  He put her on Cymbalta.  The cymbalta helps the fibromyalgia pain but she is having issues with mood.  Is there something she can take with the Cymbalta to help with the mood.

## 2012-02-15 NOTE — Telephone Encounter (Signed)
Notified pt and she would like to try Wellbutrin. Please advise re: refills.

## 2012-02-15 NOTE — Telephone Encounter (Signed)
Rx sent to pharmacy   

## 2012-02-15 NOTE — Telephone Encounter (Signed)
cymbalta 60mg  is max dose. Wouldn't take another ssri type medication. Sometimes psychiatrists will use wellbutrin 150mg  bid in combination. Otherwise would offer referral

## 2012-02-15 NOTE — Telephone Encounter (Signed)
Pt.notified

## 2012-04-09 ENCOUNTER — Other Ambulatory Visit: Payer: Self-pay | Admitting: Internal Medicine

## 2012-04-09 NOTE — Telephone Encounter (Signed)
Verbal refill order provided to [facility] at [facility]

## 2012-04-12 ENCOUNTER — Other Ambulatory Visit: Payer: Self-pay | Admitting: Internal Medicine

## 2012-04-12 NOTE — Telephone Encounter (Signed)
Rx refill sent to pharmacy. 

## 2012-04-16 ENCOUNTER — Ambulatory Visit: Payer: Self-pay | Admitting: Internal Medicine

## 2012-04-16 ENCOUNTER — Ambulatory Visit (INDEPENDENT_AMBULATORY_CARE_PROVIDER_SITE_OTHER): Payer: 59 | Admitting: Internal Medicine

## 2012-04-16 ENCOUNTER — Encounter: Payer: Self-pay | Admitting: Internal Medicine

## 2012-04-16 ENCOUNTER — Ambulatory Visit: Payer: 59 | Admitting: Internal Medicine

## 2012-04-16 VITALS — BP 152/92 | HR 84 | Temp 98.3°F | Resp 16 | Wt 244.0 lb

## 2012-04-16 DIAGNOSIS — IMO0001 Reserved for inherently not codable concepts without codable children: Secondary | ICD-10-CM

## 2012-04-16 DIAGNOSIS — M549 Dorsalgia, unspecified: Secondary | ICD-10-CM

## 2012-04-16 NOTE — Progress Notes (Signed)
  Subjective:    Patient ID: Heather Hanson, female    DOB: 1961/10/09, 51 y.o.   MRN: [id number]  HPI Pt presents to clinic for followup of multiple medical problems. Has continued chronic back pain. dx'ed with fibromyalgia by rheumatology. cymbalta does not help pain and bp as been up since beginning. Anxiety under suboptimal control since zoloft was decreased.   Past Medical History  Diagnosis Date  . Anemia     thalassemia  . Depression   . Hyperlipidemia   . Seizure disorder     presumed secondary to ativan withdrawal -Dr Sharene Skeans  . Bursitis of right shoulder   . PUD (peptic ulcer disease)     Dr Loreta Ave ( H. Pylori)  . History of colonoscopy 2000  . Chronic insomnia   . Migraines   . Obesity    Past Surgical History  Procedure Date  . Tonsillectomy 1972  . Abdominal hysterectomy 2000    Left ovary and tube intact    reports that she has never smoked. She has never used smokeless tobacco. She reports that she drinks alcohol. She reports that she does not use illicit drugs. family history includes Cancer in an unspecified family member; Coronary artery disease in an unspecified family member; and Diabetes in an unspecified family member. Allergies  Allergen Reactions  . Hydrocodone-Acetaminophen     REACTION: Rash  . Oxycodone-Acetaminophen     REACTION: Rash  . Propoxyphene-Acetaminophen     REACTION: rash      Review of Systems see hpi     Objective:   Physical Exam  Physical Exam  Nursing note and vitals reviewed. Constitutional: Appears well-developed and well-nourished. No distress.  HENT:  Head: Normocephalic and atraumatic.  Right Ear: External ear normal.  Left Ear: External ear normal.  Eyes: Conjunctivae are normal. No scleral icterus.  Neck: Neck supple. Carotid bruit is not present.  Cardiovascular: Normal rate, regular rhythm and normal heart sounds.  Exam reveals no gallop and no friction rub.   No murmur heard. Pulmonary/Chest: Effort  normal and breath sounds normal. No respiratory distress. He has no wheezes. no rales.  Lymphadenopathy:    He has no cervical adenopathy.  Neurological:Alert.  Skin: Skin is warm and dry. Not diaphoretic.  Psychiatric: Has a normal mood and affect.        Assessment & Plan:

## 2012-04-16 NOTE — Patient Instructions (Signed)
Please schedule labs prior to next visit Chem7, lft -v58.69

## 2012-04-21 NOTE — Assessment & Plan Note (Signed)
Recommend pain clinic referral. States she will consider and would prefer Dr. Murray Hodgkins is decides to proceed.

## 2012-04-21 NOTE — Assessment & Plan Note (Signed)
Recommend weaning off cymbalta as it is ineffective. Will allow titration of zoloft. States will discuss with rheumatology

## 2012-06-25 ENCOUNTER — Encounter: Payer: Self-pay | Admitting: Internal Medicine

## 2012-06-25 ENCOUNTER — Ambulatory Visit (INDEPENDENT_AMBULATORY_CARE_PROVIDER_SITE_OTHER): Payer: 59 | Admitting: Internal Medicine

## 2012-06-25 VITALS — BP 132/86 | HR 89 | Temp 98.2°F | Resp 16 | Wt 239.5 lb

## 2012-06-25 DIAGNOSIS — R51 Headache: Secondary | ICD-10-CM

## 2012-06-25 DIAGNOSIS — Z79899 Other long term (current) drug therapy: Secondary | ICD-10-CM

## 2012-06-25 DIAGNOSIS — G8929 Other chronic pain: Secondary | ICD-10-CM

## 2012-06-25 LAB — CBC WITH DIFFERENTIAL/PLATELET
Basophils Absolute: 0 10*3/uL (ref 0.0–0.1)
Basophils Relative: 0 % (ref 0–1)
Hemoglobin: 12 g/dL (ref 12.0–15.0)
MCHC: 33 g/dL (ref 30.0–36.0)
Monocytes Relative: 8 % (ref 3–12)
Neutro Abs: 3.6 10*3/uL (ref 1.7–7.7)
Neutrophils Relative %: 63 % (ref 43–77)
Platelets: 243 10*3/uL (ref 150–400)
RDW: 16 % — ABNORMAL HIGH (ref 11.5–15.5)

## 2012-06-25 LAB — HEPATIC FUNCTION PANEL
AST: 22 U/L (ref 0–37)
Alkaline Phosphatase: 86 U/L (ref 39–117)
Bilirubin, Direct: 0.1 mg/dL (ref 0.0–0.3)
Indirect Bilirubin: 0.3 mg/dL (ref 0.0–0.9)
Total Bilirubin: 0.4 mg/dL (ref 0.3–1.2)

## 2012-06-25 LAB — BASIC METABOLIC PANEL
Calcium: 9.3 mg/dL (ref 8.4–10.5)
Potassium: 3.8 mEq/L (ref 3.5–5.3)
Sodium: 135 mEq/L (ref 135–145)

## 2012-06-25 MED ORDER — TOPIRAMATE 25 MG PO TABS: ORAL_TABLET | ORAL | Status: DC

## 2012-06-27 ENCOUNTER — Other Ambulatory Visit: Payer: Self-pay | Admitting: Internal Medicine

## 2012-06-27 ENCOUNTER — Encounter: Payer: Self-pay | Admitting: Internal Medicine

## 2012-06-27 DIAGNOSIS — R51 Headache: Secondary | ICD-10-CM

## 2012-06-29 ENCOUNTER — Ambulatory Visit (HOSPITAL_BASED_OUTPATIENT_CLINIC_OR_DEPARTMENT_OTHER)
Admission: RE | Admit: 2012-06-29 | Discharge: 2012-06-29 | Disposition: A | Discharge status: Home / Self Care | Payer: 59 | Source: Ambulatory Visit | Attending: Internal Medicine | Admitting: Internal Medicine

## 2012-06-29 DIAGNOSIS — G8929 Other chronic pain: Secondary | ICD-10-CM | POA: Insufficient documentation

## 2012-06-29 DIAGNOSIS — R51 Headache: Secondary | ICD-10-CM | POA: Insufficient documentation

## 2012-06-29 DIAGNOSIS — R42 Dizziness and giddiness: Secondary | ICD-10-CM | POA: Insufficient documentation

## 2012-06-29 MED ORDER — GADOBENATE DIMEGLUMINE 529 MG/ML IV SOLN
20.0000 mL | Freq: Once | INTRAVENOUS | Status: AC | PRN
  Administered 2012-06-29: 20 mL via INTRAVENOUS

## 2012-06-29 NOTE — Progress Notes (Signed)
  Subjective:    Patient ID: Heather Hanson, female    DOB: Jun 19, 1961, 51 y.o.   MRN: [id number]  HPI Pt presents to clinic for evaluation of headache. Notes two month h/o intermittent frontal ha with aura. Denies neurological deficit and ha's do not wake her up at night. Ha's occur every three days. Notes bilateral hand tremor worse with intention. Rheumatologist continued cymbalta and zoloft. No evidence of serotonin syndrome.  Past Medical History  Diagnosis Date  . Anemia     thalassemia  . Depression   . Hyperlipidemia   . Seizure disorder     presumed secondary to ativan withdrawal -Dr Sharene Skeans  . Bursitis of right shoulder   . PUD (peptic ulcer disease)     Dr Loreta Ave ( H. Pylori)  . History of colonoscopy 2000  . Chronic insomnia   . Migraines   . Obesity    Past Surgical History  Procedure Date  . Tonsillectomy 1972  . Abdominal hysterectomy 2000    Left ovary and tube intact    reports that she has never smoked. She has never used smokeless tobacco. She reports that she drinks alcohol. She reports that she does not use illicit drugs. family history includes Cancer in an unspecified family member; Coronary artery disease in an unspecified family member; and Diabetes in an unspecified family member. Allergies  Allergen Reactions  . Hydrocodone-Acetaminophen     REACTION: Rash  . Oxycodone-Acetaminophen     REACTION: Rash  . Propoxyphene-Acetaminophen     REACTION: rash     Review of Systems see hpi     Objective:   Physical Exam  Nursing note and vitals reviewed. Constitutional: She appears well-developed and well-nourished. No distress.  HENT:  Head: Normocephalic and atraumatic.  Right Ear: External ear normal.  Left Ear: External ear normal.  Eyes: Conjunctivae and EOM are normal. Pupils are equal, round, and reactive to light. No scleral icterus.  Neck: Neck supple.  Neurological: She is alert. No cranial nerve deficit. Coordination normal.  Skin:  Skin is warm and dry. She is not diaphoretic.  Psychiatric: She has a normal mood and affect.          Assessment & Plan:

## 2012-06-29 NOTE — Assessment & Plan Note (Signed)
Change in pattern. Neurologically nonfocal. Begin topamax qd. Consider brain mri. Obtain labs. Schedule f/u for re-evaluation.

## 2012-07-16 ENCOUNTER — Encounter: Payer: Self-pay | Admitting: Internal Medicine

## 2012-07-16 ENCOUNTER — Ambulatory Visit (INDEPENDENT_AMBULATORY_CARE_PROVIDER_SITE_OTHER): Payer: 59 | Admitting: Internal Medicine

## 2012-07-16 VITALS — BP 138/78 | HR 68 | Temp 97.9°F | Resp 16 | Wt 244.0 lb

## 2012-07-16 DIAGNOSIS — R51 Headache: Secondary | ICD-10-CM

## 2012-07-16 DIAGNOSIS — G8929 Other chronic pain: Secondary | ICD-10-CM

## 2012-07-16 NOTE — Progress Notes (Signed)
  Subjective:    Patient ID: Heather Hanson, female    DOB: Apr 04, 1961, 52 y.o.   MRN: [id number]  HPI Pt presents to clinic for follow up of headaches. Since last visit underwent MRI of brain and began topamax. MRI reviewed with no mass/bleed/stroke. Tolerating topamax without side effects. No kidney stones or weight loss. States has had no headaches since beginning the medication and feels much improved.   Past Medical History  Diagnosis Date  . Anemia     thalassemia  . Depression   . Hyperlipidemia   . Seizure disorder     presumed secondary to ativan withdrawal -Dr Sharene Skeans  . Bursitis of right shoulder   . PUD (peptic ulcer disease)     Dr Loreta Ave ( H. Pylori)  . History of colonoscopy 2000  . Chronic insomnia   . Migraines   . Obesity    Past Surgical History  Procedure Date  . Tonsillectomy 1972  . Abdominal hysterectomy 2000    Left ovary and tube intact    reports that she has never smoked. She has never used smokeless tobacco. She reports that she drinks alcohol. She reports that she does not use illicit drugs. family history includes Cancer in an unspecified family member; Coronary artery disease in an unspecified family member; and Diabetes in an unspecified family member. Allergies  Allergen Reactions  . Hydrocodone-Acetaminophen     REACTION: Rash  . Oxycodone-Acetaminophen     REACTION: Rash  . Propoxyphene-Acetaminophen     REACTION: rash     Review of Systems see hpi     Objective:   Physical Exam  Nursing note and vitals reviewed. Constitutional: She appears well-developed and well-nourished. No distress.  HENT:  Head: Normocephalic and atraumatic.  Neurological: She is alert.  Skin: She is not diaphoretic.  Psychiatric: She has a normal mood and affect.          Assessment & Plan:

## 2012-07-17 NOTE — Assessment & Plan Note (Signed)
Resolved. Continue topamax.

## 2012-07-19 ENCOUNTER — Other Ambulatory Visit: Payer: Self-pay | Admitting: Internal Medicine

## 2012-07-19 NOTE — Telephone Encounter (Signed)
rf 3 

## 2012-07-19 NOTE — Telephone Encounter (Signed)
Please advise how many refills to give on mobic?

## 2012-07-26 ENCOUNTER — Other Ambulatory Visit: Payer: Self-pay | Admitting: Internal Medicine

## 2012-07-26 NOTE — Telephone Encounter (Signed)
Ok #90 rf 3

## 2012-07-26 NOTE — Telephone Encounter (Signed)
Lorazepam request [last refill 04.30.13 #[id number]-early request]/SLS Please advise.

## 2012-07-26 NOTE — Telephone Encounter (Signed)
Rx done/SLS 

## 2012-08-13 ENCOUNTER — Ambulatory Visit: Payer: 59 | Admitting: Internal Medicine

## 2012-10-15 ENCOUNTER — Encounter: Payer: Self-pay | Admitting: Internal Medicine

## 2012-10-15 ENCOUNTER — Ambulatory Visit (INDEPENDENT_AMBULATORY_CARE_PROVIDER_SITE_OTHER): Payer: 59 | Admitting: Internal Medicine

## 2012-10-15 VITALS — BP 118/86 | HR 79 | Temp 98.2°F | Resp 16 | Wt 237.5 lb

## 2012-10-15 DIAGNOSIS — E785 Hyperlipidemia, unspecified: Secondary | ICD-10-CM

## 2012-10-15 DIAGNOSIS — G8929 Other chronic pain: Secondary | ICD-10-CM

## 2012-10-15 DIAGNOSIS — R51 Headache: Secondary | ICD-10-CM

## 2012-10-15 DIAGNOSIS — E669 Obesity, unspecified: Secondary | ICD-10-CM

## 2012-10-15 DIAGNOSIS — R110 Nausea: Secondary | ICD-10-CM

## 2012-10-15 MED ORDER — PROMETHAZINE HCL 12.5 MG PO TABS: 12.5000 mg | ORAL_TABLET | Freq: Four times a day (QID) | ORAL | Status: DC | PRN

## 2012-10-15 MED ORDER — TOPIRAMATE 50 MG PO TABS: ORAL_TABLET | ORAL | Status: DC

## 2012-10-15 MED ORDER — ORPHENADRINE CITRATE ER 100 MG PO TB12: 100.0000 mg | ORAL_TABLET | Freq: Two times a day (BID) | ORAL | Status: DC | PRN

## 2012-10-15 NOTE — Assessment & Plan Note (Signed)
Suboptimal control. Declines all statin therapy. Repeat lipid profile at next visit

## 2012-10-15 NOTE — Assessment & Plan Note (Signed)
Refill Phenergan.

## 2012-10-15 NOTE — Assessment & Plan Note (Signed)
Weight down 7 pounds. Encouraged in weight loss efforts.

## 2012-10-15 NOTE — Progress Notes (Signed)
  Subjective:    Patient ID: Heather Hanson, female    DOB: 03-12-1961, 51 y.o.   MRN: [id number]  HPI Pt presents to clinic for followup of multiple medical problems. States headaches have returned despite Topamax. Attempted to self increase the dose. Tolerating without adverse effect. Not currently inclined to see neurology. Reminded of mammogram being due. Reviewed history of hyperlipidemia previously on Crestor. Declines all statin therapy. Feels diffuse arthralgias and states has a fibromyalgia flare coming on. Complains of being overworked in her occupation.  Past Medical History  Diagnosis Date  . Anemia     thalassemia  . Depression   . Hyperlipidemia   . Seizure disorder     presumed secondary to ativan withdrawal -Dr Sharene Skeans  . Bursitis of right shoulder   . PUD (peptic ulcer disease)     Dr Loreta Ave ( H. Pylori)  . History of colonoscopy 2000  . Chronic insomnia   . Migraines   . Obesity    Past Surgical History  Procedure Date  . Tonsillectomy 1972  . Abdominal hysterectomy 2000    Left ovary and tube intact    reports that she has never smoked. She has never used smokeless tobacco. She reports that she drinks alcohol. She reports that she does not use illicit drugs. family history includes Cancer in an unspecified family member; Coronary artery disease in an unspecified family member; and Diabetes in an unspecified family member. Allergies  Allergen Reactions  . Hydrocodone-Acetaminophen     REACTION: Rash  . Oxycodone-Acetaminophen     REACTION: Rash  . Propoxyphene-Acetaminophen     REACTION: rash      Review of Systems see hpi     Objective:   Physical Exam  Nursing note and vitals reviewed. Constitutional: She appears well-developed and well-nourished. No distress.  HENT:  Head: Normocephalic and atraumatic.  Right Ear: External ear normal.  Left Ear: External ear normal.  Eyes: Conjunctivae normal are normal. No scleral icterus.  Neck: Neck  supple. Carotid bruit is not present.  Cardiovascular: Normal rate, regular rhythm and normal heart sounds.   Pulmonary/Chest: Effort normal and breath sounds normal.  Neurological: She is alert.  Skin: She is not diaphoretic.  Psychiatric: She has a normal mood and affect.    Physical Exam  Nursing note and vitals reviewed. Constitutional: Appears well-developed and well-nourished. No distress.  HENT:  Head: Normocephalic and atraumatic.  Right Ear: External ear normal.  Left Ear: External ear normal.  Eyes: Conjunctivae are normal. No scleral icterus.  Neck: Neck supple. Carotid bruit is not present.  Cardiovascular: Normal rate, regular rhythm and normal heart sounds.  Exam reveals no gallop and no friction rub.   No murmur heard. Pulmonary/Chest: Effort normal and breath sounds normal. No respiratory distress. He has no wheezes. no rales.  Lymphadenopathy:    He has no cervical adenopathy.  Neurological:Alert.  Skin: Skin is warm and dry. Not diaphoretic.  Psychiatric: Has a normal mood and affect.        Assessment & Plan:

## 2012-10-15 NOTE — Assessment & Plan Note (Signed)
Headaches have returned. Increase Topamax. Declines neurology consult

## 2012-10-15 NOTE — Patient Instructions (Signed)
Please schedule fasting labs prior to next visit chem12-v58.69 and lipid-272.4

## 2012-10-23 ENCOUNTER — Telehealth: Payer: Self-pay | Admitting: Internal Medicine

## 2012-10-23 DIAGNOSIS — Z13820 Encounter for screening for osteoporosis: Secondary | ICD-10-CM

## 2012-10-23 DIAGNOSIS — Z780 Asymptomatic menopausal state: Secondary | ICD-10-CM

## 2012-10-23 NOTE — Telephone Encounter (Signed)
She is going to the breast center for her mammogram.  She would like to have a bone density done there at that time.  Would you give her an order

## 2012-10-24 ENCOUNTER — Other Ambulatory Visit: Payer: Self-pay | Admitting: Internal Medicine

## 2012-10-28 NOTE — Telephone Encounter (Signed)
Ok to fax order

## 2012-10-28 NOTE — Telephone Encounter (Signed)
Patient came in office requesting an order for bone density to be faxed over to the breast center at [facility]. Fax# [phone]

## 2012-10-29 NOTE — Telephone Encounter (Signed)
Done to [facility] Center AM/SLS

## 2012-10-30 NOTE — Addendum Note (Signed)
Addended by: Regis Bill on: 10/30/2012 02:30 PM   Modules accepted: Orders

## 2012-10-31 ENCOUNTER — Other Ambulatory Visit: Payer: Self-pay | Admitting: Internal Medicine

## 2012-10-31 DIAGNOSIS — Z1231 Encounter for screening mammogram for malignant neoplasm of breast: Secondary | ICD-10-CM

## 2012-11-19 ENCOUNTER — Other Ambulatory Visit: Payer: Self-pay | Admitting: Internal Medicine

## 2012-11-20 NOTE — Telephone Encounter (Signed)
Last OV 10-15-12, last filled 07-26-12 #90 3

## 2012-11-20 NOTE — Telephone Encounter (Signed)
Rx done. 

## 2012-11-20 NOTE — Telephone Encounter (Signed)
Ok jto fill #90 rf 2

## 2012-12-10 ENCOUNTER — Other Ambulatory Visit: Payer: 59

## 2012-12-10 ENCOUNTER — Ambulatory Visit: Payer: 59

## 2012-12-16 ENCOUNTER — Other Ambulatory Visit: Payer: Self-pay | Admitting: Internal Medicine

## 2012-12-16 NOTE — Telephone Encounter (Signed)
Meloxicam request [Last Rx 08.09.13 #30x3]/SLS Please advise.

## 2012-12-17 ENCOUNTER — Telehealth: Payer: Self-pay | Admitting: Internal Medicine

## 2012-12-17 NOTE — Telephone Encounter (Signed)
Denied--pt weaned off medication in May 2013 by PCP, resumed by Rheumatology [reported in July 2013 OV], informed pharmacy, will need to be handled via pt's Rheumatologist/SLS

## 2012-12-17 NOTE — Telephone Encounter (Signed)
Refill-cymbalta 60mg  capsule. Take one capsule by mouth once daily. Qty 30 last fill 12.10.13

## 2012-12-31 ENCOUNTER — Ambulatory Visit
Admission: RE | Admit: 2012-12-31 | Discharge: 2012-12-31 | Disposition: A | Discharge status: Home / Self Care | Payer: 59 | Source: Ambulatory Visit | Attending: Internal Medicine | Admitting: Internal Medicine

## 2012-12-31 DIAGNOSIS — Z780 Asymptomatic menopausal state: Secondary | ICD-10-CM

## 2012-12-31 DIAGNOSIS — Z1231 Encounter for screening mammogram for malignant neoplasm of breast: Secondary | ICD-10-CM

## 2013-01-08 ENCOUNTER — Other Ambulatory Visit: Payer: Self-pay | Admitting: Hematology & Oncology

## 2013-01-09 ENCOUNTER — Telehealth: Payer: Self-pay

## 2013-01-09 NOTE — Telephone Encounter (Signed)
Left a message for patient to return my call.  Per MD mammogram is normal

## 2013-01-09 NOTE — Telephone Encounter (Signed)
Marge informed patient

## 2013-01-15 ENCOUNTER — Other Ambulatory Visit: Payer: Self-pay | Admitting: Hematology & Oncology

## 2013-01-15 ENCOUNTER — Ambulatory Visit (HOSPITAL_BASED_OUTPATIENT_CLINIC_OR_DEPARTMENT_OTHER)
Admission: RE | Admit: 2013-01-15 | Discharge: 2013-01-15 | Disposition: A | Discharge status: Home / Self Care | Payer: 59 | Source: Ambulatory Visit | Attending: Hematology & Oncology | Admitting: Hematology & Oncology

## 2013-01-15 DIAGNOSIS — R059 Cough, unspecified: Secondary | ICD-10-CM | POA: Insufficient documentation

## 2013-01-15 DIAGNOSIS — R0781 Pleurodynia: Secondary | ICD-10-CM

## 2013-01-15 DIAGNOSIS — R05 Cough: Secondary | ICD-10-CM | POA: Insufficient documentation

## 2013-01-15 DIAGNOSIS — R071 Chest pain on breathing: Secondary | ICD-10-CM | POA: Insufficient documentation

## 2013-01-25 ENCOUNTER — Other Ambulatory Visit: Payer: Self-pay

## 2013-02-11 ENCOUNTER — Other Ambulatory Visit: Payer: Self-pay | Admitting: Family Medicine

## 2013-02-11 ENCOUNTER — Ambulatory Visit (INDEPENDENT_AMBULATORY_CARE_PROVIDER_SITE_OTHER): Payer: 59 | Admitting: Family Medicine

## 2013-02-11 ENCOUNTER — Encounter: Payer: Self-pay | Admitting: Family Medicine

## 2013-02-11 ENCOUNTER — Telehealth: Payer: Self-pay | Admitting: Family Medicine

## 2013-02-11 ENCOUNTER — Telehealth: Payer: Self-pay

## 2013-02-11 VITALS — BP 130/82 | HR 81 | Temp 97.8°F | Ht 67.0 in | Wt 231.1 lb

## 2013-02-11 DIAGNOSIS — IMO0001 Reserved for inherently not codable concepts without codable children: Secondary | ICD-10-CM

## 2013-02-11 DIAGNOSIS — E785 Hyperlipidemia, unspecified: Secondary | ICD-10-CM

## 2013-02-11 DIAGNOSIS — Z79899 Other long term (current) drug therapy: Secondary | ICD-10-CM

## 2013-02-11 DIAGNOSIS — R609 Edema, unspecified: Secondary | ICD-10-CM

## 2013-02-11 DIAGNOSIS — Z0000 Encounter for general adult medical examination without abnormal findings: Secondary | ICD-10-CM

## 2013-02-11 DIAGNOSIS — F411 Generalized anxiety disorder: Secondary | ICD-10-CM

## 2013-02-11 LAB — BASIC METABOLIC PANEL
BUN: 15 mg/dL (ref 6–23)
Calcium: 9.2 mg/dL (ref 8.4–10.5)
Chloride: 106 mEq/L (ref 96–112)
Creat: 0.89 mg/dL (ref 0.50–1.10)

## 2013-02-11 LAB — LIPID PANEL
Cholesterol: 297 mg/dL — ABNORMAL HIGH (ref 0–200)
HDL: 70 mg/dL (ref >39)
LDL Cholesterol: 212 mg/dL — ABNORMAL HIGH (ref 0–99)
Total CHOL/HDL Ratio: 4.2 Ratio
Triglycerides: 73 mg/dL (ref <150)
VLDL: 15 mg/dL (ref 0–40)

## 2013-02-11 MED ORDER — DULOXETINE HCL 60 MG PO CPEP: 60.0000 mg | ORAL_CAPSULE | Freq: Every day | ORAL | Status: DC

## 2013-02-11 MED ORDER — FUROSEMIDE 20 MG PO TABS: 20.0000 mg | ORAL_TABLET | Freq: Every day | ORAL | Status: DC | PRN

## 2013-02-11 MED ORDER — LORAZEPAM 2 MG PO TABS: 2.0000 mg | ORAL_TABLET | Freq: Three times a day (TID) | ORAL | Status: DC | PRN

## 2013-02-11 NOTE — Patient Instructions (Addendum)
Consider starting Krill oil caps daily  Next visit annual with labs prior lipid, renal, tsh, cbc, hepatic   Fibromyalgia Fibromyalgia is a disorder that is often misunderstood. It is associated with muscular pains and tenderness that comes and goes. It is often associated with fatigue and sleep disturbances. Though it tends to be long-lasting, fibromyalgia is not life-threatening. CAUSES  The exact cause of fibromyalgia is unknown. People with certain gene types are predisposed to developing fibromyalgia and other conditions. Certain factors can play a role as triggers, such as:  Spine disorders.  Arthritis.  Severe injury (trauma) and other physical stressors.  Emotional stressors. SYMPTOMS   The main symptom is pain and stiffness in the muscles and joints, which can vary over time.  Sleep and fatigue problems. Other related symptoms may include:  Bowel and bladder problems.  Headaches.  Visual problems.  Problems with odors and noises.  Depression or mood changes.  Painful periods (dysmenorrhea).  Dryness of the skin or eyes. DIAGNOSIS  There are no specific tests for diagnosing fibromyalgia. Patients can be diagnosed accurately from the specific symptoms they have. The diagnosis is made by determining that nothing else is causing the problems. TREATMENT  There is no cure. Management includes medicines and an active, healthy lifestyle. The goal is to enhance physical fitness, decrease pain, and improve sleep. HOME CARE INSTRUCTIONS   Only take over-the-counter or prescription medicines as directed by your caregiver. Sleeping pills, tranquilizers, and pain medicines may make your problems worse.  Low-impact aerobic exercise is very important and advised for treatment. At first, it may seem to make pain worse. Gradually increasing your tolerance will overcome this feeling.  Learning relaxation techniques and how to control stress will help you. Biofeedback, visual  imagery, hypnosis, muscle relaxation, yoga, and meditation are all options.  Anti-inflammatory medicines and physical therapy may provide short-term help.  Acupuncture or massage treatments may help.  Take muscle relaxant medicines as suggested by your caregiver.  Avoid stressful situations.  Plan a healthy lifestyle. This includes your diet, sleep, rest, exercise, and friends.  Find and practice a hobby you enjoy.  Join a fibromyalgia support group for interaction, ideas, and sharing advice. This may be helpful. SEEK MEDICAL CARE IF:  You are not having good results or improvement from your treatment. FOR MORE INFORMATION  National Fibromyalgia Association: www.fmaware.org Arthritis Foundation: www.arthritis.org Document Released: 11/27/2005 Document Revised: 02/19/2012 Document Reviewed: 03/09/2010 [facility] Patient Information 2013 [place], [place].

## 2013-02-11 NOTE — Telephone Encounter (Signed)
Labs ordered for todays visit and for 08-12-13 visit

## 2013-02-12 ENCOUNTER — Other Ambulatory Visit: Payer: Self-pay | Admitting: Internal Medicine

## 2013-02-12 NOTE — Telephone Encounter (Signed)
Opened in error

## 2013-02-17 NOTE — Telephone Encounter (Signed)
Quick Note:  Patient Informed and voiced understanding.  i left samples and directions in cabinet for pt.  Pt will inform us which one she would like to do ______

## 2013-02-18 NOTE — Assessment & Plan Note (Addendum)
Recent flare in myalgias. Encouraged increase rest and exercise, use Norflex and Meloxicam and report if no improvement. Has used Oxycodone in past with good results.

## 2013-02-18 NOTE — Progress Notes (Signed)
Patient ID: Heather Hanson, female   DOB: 10/29/1961, 52 y.o.   MRN: [id number] Heather Hanson [id number] 1961-04-01 02/18/2013      Progress Note-Follow Up  Subjective  Chief Complaint  Chief Complaint  Patient presents with  . Follow-up    4 month    HPI  Patient is a 52 year old female who is in today for followup. Has several complaints. One is of worsening myalgias and arthralgias. Notes to worsen the discomfort is in her arms. Describes as a warm feeling in her arms radiating up to her neck. She also has pain and stiffness in the left hip. Struggles with intermittent migraines and ultimately consulted for this intermittently. Has used Topamax in as well. No fevers or chills. No recent illness. No chest pain, palpitations, shortness of breath GI or GU complaints otherwise noted today appear  Past Medical History  Diagnosis Date  . Anemia     thalassemia  . Depression   . Hyperlipidemia   . Seizure disorder     presumed secondary to ativan withdrawal -Dr Sharene Skeans  . Bursitis of right shoulder   . PUD (peptic ulcer disease)     Dr Loreta Ave ( H. Pylori)  . History of colonoscopy 2000  . Chronic insomnia   . Migraines   . Obesity     Past Surgical History  Procedure Laterality Date  . Tonsillectomy  1972  . Abdominal hysterectomy  2000    Left ovary and tube intact    Family History  Problem Relation Age of Onset  . Cancer      Breast < 93 yo, ,Prostate <50 yo  . Coronary artery disease      <60 yo  . Diabetes      History   Social History  . Marital Status: Divorced    Spouse Name: N/A    Number of Children: 2  . Years of Education: N/A   Occupational History  . 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 Smoking status: Never Smoker   . Smokeless tobacco: Never Used  . Alcohol Use: Yes     Comment: wine occasionally  . Drug Use: No  . Sexually Active: Not on file   Other Topics Concern  . Not on file   Social History  Narrative   Occupation: Water quality scientist   Divorced     2 sons 26, 75      Never Smoked     Alcohol use-no       Current Outpatient Prescriptions on File Prior to Visit  Medication Sig Dispense Refill  . cetirizine (ZYRTEC) 10 MG tablet Take 10 mg by mouth daily as needed.       . meloxicam (MOBIC) 7.5 MG tablet TAKE 1 TABLET BY MOUTH DAILY.  30 tablet  3  . mometasone (NASONEX) 50 MCG/ACT nasal spray 2 sprays by Nasal route daily.        Marland Kitchen NALTREXONE HCL PO Take 4 mg by mouth at bedtime.      Marland Kitchen omeprazole-sodium bicarbonate (ZEGERID) 40-1100 MG per capsule TAKE 1 CAPSULE BY MOUTH ONCE DAILY  90 capsule  3  . promethazine (PHENERGAN) 12.5 MG tablet Take 1 tablet (12.5 mg total) by mouth every 6 (six) hours as needed.  30 tablet  3  . topiramate (TOPAMAX) 50 MG tablet One by mouth twice a day  60 tablet  4   No current facility-administered medications on file prior to visit.  Allergies  Allergen Reactions  . Hydrocodone-Acetaminophen     REACTION: Rash  . Oxycodone-Acetaminophen     REACTION: Rash  . Propoxyphene-Acetaminophen     REACTION: rash    Review of Systems  Review of Systems  Constitutional: Negative for fever and malaise/fatigue.  HENT: Negative for congestion.   Eyes: Negative for discharge.  Respiratory: Negative for shortness of breath.   Cardiovascular: Negative for chest pain, palpitations and leg swelling.  Gastrointestinal: Positive for heartburn. Negative for nausea, abdominal pain and diarrhea.  Genitourinary: Negative for dysuria.  Musculoskeletal: Positive for myalgias and joint pain. Negative for falls.  Skin: Negative for rash.  Neurological: Positive for headaches. Negative for loss of consciousness.  Endo/Heme/Allergies: Negative for polydipsia.  Psychiatric/Behavioral: Negative for depression and suicidal ideas. The patient is not nervous/anxious and does not have insomnia.     Objective  BP 130/82  Pulse 81  Temp(Src) 97.8 F (36.6 C)  (Oral)  Ht 5\' 7"  (1.702 m)  Wt 231 lb 1.9 oz (104.835 kg)  BMI 36.19 kg/m2  SpO2 99%  Physical Exam  Physical Exam  Constitutional: She is oriented to person, place, and time and well-developed, well-nourished, and in no distress. No distress.  HENT:  Head: Normocephalic and atraumatic.  Eyes: Conjunctivae are normal.  Neck: Neck supple. No thyromegaly present.  Cardiovascular: Normal rate, regular rhythm and normal heart sounds.   No murmur heard. Pulmonary/Chest: Effort normal and breath sounds normal. She has no wheezes.  Abdominal: She exhibits no distension and no mass.  Musculoskeletal: She exhibits no edema.  Lymphadenopathy:    She has no cervical adenopathy.  Neurological: She is alert and oriented to person, place, and time.  Skin: Skin is warm and dry. No rash noted. She is not diaphoretic.  Psychiatric: Memory, affect and judgment normal.    Lab Results  Component Value Date   TSH 2.918 09/12/2011   Lab Results  Component Value Date   WBC 5.7 06/25/2012   HGB 12.0 06/25/2012   HCT 36.4 06/25/2012   MCV 66.9* 06/25/2012   PLT 243 06/25/2012   Lab Results  Component Value Date   CREATININE 0.89 02/11/2013   BUN 15 02/11/2013   NA 136 02/11/2013   K 4.2 02/11/2013   CL 106 02/11/2013   CO2 23 02/11/2013   Lab Results  Component Value Date   ALT 14 06/25/2012   AST 22 06/25/2012   ALKPHOS 86 06/25/2012   BILITOT 0.4 06/25/2012   Lab Results  Component Value Date   CHOL 297* 02/11/2013   Lab Results  Component Value Date   HDL 70 02/11/2013   Lab Results  Component Value Date   LDLCALC 212* 02/11/2013   Lab Results  Component Value Date   TRIG 73 02/11/2013   Lab Results  Component Value Date   CHOLHDL 4.2 02/11/2013     Assessment & Plan  FIBROMYALGIA Recent flare in myalgias. Encouraged increase rest and exercise, use Norflex and Meloxicam and report if no improvement. Has used Oxycodone in past with good results.   HYPERLIPIDEMIA Patient refuses statins but  agrees to [facility], patient provided with samples of tabs and powder sos she can let us know which she likes better. Avoid trans fats, minimize saturated fats and simple carbs and increase exercise

## 2013-02-18 NOTE — Assessment & Plan Note (Signed)
Patient refuses statins but agrees to [facility], patient provided with samples of tabs and powder sos she can let us know which she likes better. Avoid trans fats, minimize saturated fats and simple carbs and increase exercise

## 2013-03-06 ENCOUNTER — Other Ambulatory Visit: Payer: Self-pay | Admitting: Internal Medicine

## 2013-03-06 NOTE — Telephone Encounter (Signed)
Rx request to pharmacy/[facility]  

## 2013-03-11 ENCOUNTER — Encounter: Payer: Self-pay | Admitting: Family Medicine

## 2013-03-11 MED ORDER — BUTALBITAL-ACETAMINOPHEN 50-650 MG PO TABS: 1.0000 | ORAL_TABLET | Freq: Four times a day (QID) | ORAL | Status: DC | PRN

## 2013-03-11 NOTE — Telephone Encounter (Signed)
I spoke to pt and she stated she would like to try the Bupap. I will send to [facility] per pts request

## 2013-04-04 ENCOUNTER — Encounter: Payer: Self-pay | Admitting: Family Medicine

## 2013-04-08 ENCOUNTER — Encounter: Payer: Self-pay | Admitting: Family Medicine

## 2013-04-08 ENCOUNTER — Ambulatory Visit (INDEPENDENT_AMBULATORY_CARE_PROVIDER_SITE_OTHER): Payer: 59 | Admitting: Family Medicine

## 2013-04-08 VITALS — BP 124/82 | HR 70 | Temp 97.9°F | Ht 67.0 in | Wt 229.0 lb

## 2013-04-08 DIAGNOSIS — G43909 Migraine, unspecified, not intractable, without status migrainosus: Secondary | ICD-10-CM

## 2013-04-08 DIAGNOSIS — R519 Headache, unspecified: Secondary | ICD-10-CM

## 2013-04-08 DIAGNOSIS — R51 Headache: Secondary | ICD-10-CM

## 2013-04-08 DIAGNOSIS — J019 Acute sinusitis, unspecified: Secondary | ICD-10-CM

## 2013-04-08 DIAGNOSIS — Z5189 Encounter for other specified aftercare: Secondary | ICD-10-CM

## 2013-04-08 DIAGNOSIS — G8929 Other chronic pain: Secondary | ICD-10-CM

## 2013-04-08 DIAGNOSIS — T7840XD Allergy, unspecified, subsequent encounter: Secondary | ICD-10-CM

## 2013-04-08 DIAGNOSIS — M542 Cervicalgia: Secondary | ICD-10-CM

## 2013-04-08 MED ORDER — MOMETASONE FUROATE 50 MCG/ACT NA SUSP: 2.0000 | Freq: Every day | NASAL | Status: DC

## 2013-04-08 MED ORDER — TOPIRAMATE 50 MG PO TABS: 50.0000 mg | ORAL_TABLET | Freq: Three times a day (TID) | ORAL | Status: DC

## 2013-04-08 MED ORDER — CETIRIZINE HCL 10 MG PO TABS: 10.0000 mg | ORAL_TABLET | Freq: Every day | ORAL | Status: DC | PRN

## 2013-04-08 MED ORDER — AMOXICILLIN-POT CLAVULANATE 875-125 MG PO TABS: 1.0000 | ORAL_TABLET | Freq: Two times a day (BID) | ORAL | Status: DC

## 2013-04-08 MED ORDER — RIZATRIPTAN BENZOATE 10 MG PO TABS: 10.0000 mg | ORAL_TABLET | ORAL | Status: DC | PRN

## 2013-04-08 MED ORDER — ORPHENADRINE CITRATE ER 100 MG PO TB12: 100.0000 mg | ORAL_TABLET | Freq: Two times a day (BID) | ORAL | Status: DC | PRN

## 2013-04-08 NOTE — Patient Instructions (Addendum)
mucinex 6009 mg twice a day x 14 days Probiotic such as Digestive Advantage daily  Sinusitis Sinusitis is redness, soreness, and swelling (inflammation) of the paranasal sinuses. Paranasal sinuses are air pockets within the bones of your face (beneath the eyes, the middle of the forehead, or above the eyes). In healthy paranasal sinuses, mucus is able to drain out, and air is able to circulate through them by way of your nose. However, when your paranasal sinuses are inflamed, mucus and air can become trapped. This can allow bacteria and other germs to grow and cause infection. Sinusitis can develop quickly and last only a short time (acute) or continue over a long period (chronic). Sinusitis that lasts for more than 12 weeks is considered chronic.  CAUSES  Causes of sinusitis include:  Allergies.  Structural abnormalities, such as displacement of the cartilage that separates your nostrils (deviated septum), which can decrease the air flow through your nose and sinuses and affect sinus drainage.  Functional abnormalities, such as when the small hairs (cilia) that line your sinuses and help remove mucus do not work properly or are not present. SYMPTOMS  Symptoms of acute and chronic sinusitis are the same. The primary symptoms are pain and pressure around the affected sinuses. Other symptoms include:  Upper toothache.  Earache.  Headache.  Bad breath.  Decreased sense of smell and taste.  A cough, which worsens when you are lying flat.  Fatigue.  Fever.  Thick drainage from your nose, which often is green and may contain pus (purulent).  Swelling and warmth over the affected sinuses. DIAGNOSIS  Your caregiver will perform a physical exam. During the exam, your caregiver may:  Look in your nose for signs of abnormal growths in your nostrils (nasal polyps).  Tap over the affected sinus to check for signs of infection.  View the inside of your sinuses (endoscopy) with a special  imaging device with a light attached (endoscope), which is inserted into your sinuses. If your caregiver suspects that you have chronic sinusitis, one or more of the following tests may be recommended:  Allergy tests.  Nasal culture A sample of mucus is taken from your nose and sent to a lab and screened for bacteria.  Nasal cytology A sample of mucus is taken from your nose and examined by your caregiver to determine if your sinusitis is related to an allergy. TREATMENT  Most cases of acute sinusitis are related to a viral infection and will resolve on their own within 10 days. Sometimes medicines are prescribed to help relieve symptoms (pain medicine, decongestants, nasal steroid sprays, or saline sprays).  However, for sinusitis related to a bacterial infection, your caregiver will prescribe antibiotic medicines. These are medicines that will help kill the bacteria causing the infection.  Rarely, sinusitis is caused by a fungal infection. In theses cases, your caregiver will prescribe antifungal medicine. For some cases of chronic sinusitis, surgery is needed. Generally, these are cases in which sinusitis recurs more than 3 times per year, despite other treatments. HOME CARE INSTRUCTIONS   Drink plenty of water. Water helps thin the mucus so your sinuses can drain more easily.  Use a humidifier.  Inhale steam 3 to 4 times a day (for example, sit in the bathroom with the shower running).  Apply a warm, moist washcloth to your face 3 to 4 times a day, or as directed by your caregiver.  Use saline nasal sprays to help moisten and clean your sinuses.  Take over-the-counter or  prescription medicines for pain, discomfort, or fever only as directed by your caregiver. SEEK IMMEDIATE MEDICAL CARE IF:  You have increasing pain or severe headaches.  You have nausea, vomiting, or drowsiness.  You have swelling around your face.  You have vision problems.  You have a stiff neck.  You have  difficulty breathing. MAKE SURE YOU:   Understand these instructions.  Will watch your condition.  Will get help right away if you are not doing well or get worse. Document Released: 11/27/2005 Document Revised: 02/19/2012 Document Reviewed: 12/12/2011 [facility] Patient Information 2013 [place], [place].

## 2013-04-09 ENCOUNTER — Encounter: Payer: Self-pay | Admitting: Family Medicine

## 2013-04-09 DIAGNOSIS — T7840XA Allergy, unspecified, initial encounter: Secondary | ICD-10-CM

## 2013-04-09 DIAGNOSIS — J019 Acute sinusitis, unspecified: Secondary | ICD-10-CM

## 2013-04-09 HISTORY — DX: Acute sinusitis, unspecified: J01.90

## 2013-04-09 HISTORY — DX: Allergy, unspecified, initial encounter: T78.40XA

## 2013-04-10 ENCOUNTER — Encounter: Payer: Self-pay | Admitting: Family Medicine

## 2013-04-10 NOTE — Telephone Encounter (Signed)
Please advise if these are ok to RX?

## 2013-04-10 NOTE — Assessment & Plan Note (Signed)
Worse with sinusitis, combination of tension and migraine HA, increase Topamax to 50 mg po tid, continue Norflex bid and encouraged moist heat and gentle stretching, increased rest and hydration

## 2013-04-10 NOTE — Progress Notes (Signed)
Patient ID: Heather Hanson, female   DOB: [birth year], 52 y.o.   MRN: [id number] Heather Hanson [id number] 1961-08-29 04/10/2013      Progress Note-Follow Up  Subjective  Chief Complaint  Chief Complaint  Patient presents with  . Headache    HPI  Patient is a 52 year old African American female in today complaining of worsening. She has a combination headaches. Several week. Says they're often tension headaches with neck tension and generalized pain and other times going for migraines with photophobia and phonophobia. She gets nauseous at times as well. She does the finances in remission for over a month now with increased sinus pressure pain itchy watery nose and eyes at times. Low-grade malaise generalized myalgias and fatigue are also noted. No obvious fevers or chills. No chest pain or palpitations. No recent seizure activity. No GI or GU complaints except for some intermittent nausea with her headaches  Past Medical History  Diagnosis Date  . Anemia     thalassemia  . Depression   . Hyperlipidemia   . Seizure disorder     presumed secondary to ativan withdrawal -Dr Sharene Skeans  . Bursitis of right shoulder   . PUD (peptic ulcer disease)     Dr Loreta Ave ( H. Pylori)  . History of colonoscopy 2000  . Chronic insomnia   . Migraines   . Obesity   . Sinusitis, acute 04/09/2013  . Allergic state 04/09/2013    Past Surgical History  Procedure Laterality Date  . Tonsillectomy  1972  . Abdominal hysterectomy  2000    Left ovary and tube intact    Family History  Problem Relation Age of Onset  . Cancer      Breast < 54 yo, ,Prostate <50 yo  . Coronary artery disease      <60 yo  . Diabetes      History   Social History  . Marital Status: Divorced    Spouse Name: N/A    Number of Children: 2  . Years of Education: N/A   Occupational History  . 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 Smoking status: Never Smoker   . Smokeless tobacco: Never Used   . Alcohol Use: Yes     Comment: wine occasionally  . Drug Use: No  . Sexually Active: Not on file   Other Topics Concern  . Not on file   Social History Narrative   Occupation: Water quality scientist   Divorced     2 sons 26, 60      Never Smoked     Alcohol use-no       Current Outpatient Prescriptions on File Prior to Visit  Medication Sig Dispense Refill  . DULoxetine (CYMBALTA) 60 MG capsule Take 1 capsule (60 mg total) by mouth daily.  30 capsule  5  . furosemide (LASIX) 20 MG tablet Take 1 tablet (20 mg total) by mouth daily as needed.  30 tablet  5  . LORazepam (ATIVAN) 2 MG tablet Take 1 tablet (2 mg total) by mouth every 8 (eight) hours as needed for anxiety.  90 tablet  2  . meloxicam (MOBIC) 7.5 MG tablet TAKE 1 TABLET BY MOUTH DAILY.  30 tablet  3  . NALTREXONE HCL PO Take 4 mg by mouth at bedtime.      Marland Kitchen omeprazole-sodium bicarbonate (ZEGERID) 40-1100 MG per capsule TAKE 1 CAPSULE BY MOUTH ONCE DAILY  90 capsule  3  . promethazine (PHENERGAN) 12.5  MG tablet Take 1 tablet (12.5 mg total) by mouth every 6 (six) hours as needed.  30 tablet  3   No current facility-administered medications on file prior to visit.    Allergies  Allergen Reactions  . Hydrocodone-Acetaminophen     REACTION: Rash  . Oxycodone-Acetaminophen     REACTION: Rash  . Propoxyphene-Acetaminophen     REACTION: rash    Review of Systems  Review of Systems  Constitutional: Positive for malaise/fatigue. Negative for fever.  HENT: Positive for congestion and neck pain.   Eyes: Negative for discharge.  Respiratory: Negative for shortness of breath.   Cardiovascular: Negative for chest pain, palpitations and leg swelling.  Gastrointestinal: Negative for nausea, abdominal pain and diarrhea.  Genitourinary: Negative for dysuria.  Musculoskeletal: Positive for myalgias. Negative for falls.  Skin: Negative for rash.  Neurological: Positive for headaches. Negative for loss of consciousness.   Endo/Heme/Allergies: Negative for polydipsia.  Psychiatric/Behavioral: Negative for depression and suicidal ideas. The patient is not nervous/anxious and does not have insomnia.     Objective  BP 124/82  Pulse 70  Temp(Src) 97.9 F (36.6 C) (Oral)  Ht 5\' 7"  (1.702 m)  Wt 229 lb (103.874 kg)  BMI 35.86 kg/m2  SpO2 99%  Physical Exam  Physical Exam  Constitutional: She is oriented to person, place, and time and well-developed, well-nourished, and in no distress. No distress.  HENT:  Head: Normocephalic and atraumatic.  Eyes: Conjunctivae are normal.  Neck: Neck supple. No thyromegaly present.  Cardiovascular: Normal rate, regular rhythm and normal heart sounds.   No murmur heard. Pulmonary/Chest: Effort normal and breath sounds normal. She has no wheezes.  Abdominal: She exhibits no distension and no mass.  Musculoskeletal: She exhibits no edema.  Lymphadenopathy:    She has no cervical adenopathy.  Neurological: She is alert and oriented to person, place, and time.  Skin: Skin is warm and dry. No rash noted. She is not diaphoretic.  Psychiatric: Memory, affect and judgment normal.    Lab Results  Component Value Date   TSH 2.217 04/08/2013   Lab Results  Component Value Date   WBC 5.7 06/25/2012   HGB 12.0 06/25/2012   HCT 36.4 06/25/2012   MCV 66.9* 06/25/2012   PLT 243 06/25/2012   Lab Results  Component Value Date   CREATININE 0.89 02/11/2013   BUN 15 02/11/2013   NA 136 02/11/2013   K 4.2 02/11/2013   CL 106 02/11/2013   CO2 23 02/11/2013   Lab Results  Component Value Date   ALT 14 06/25/2012   AST 22 06/25/2012   ALKPHOS 86 06/25/2012   BILITOT 0.4 06/25/2012   Lab Results  Component Value Date   CHOL 297* 02/11/2013   Lab Results  Component Value Date   HDL 70 02/11/2013   Lab Results  Component Value Date   LDLCALC 212* 02/11/2013   Lab Results  Component Value Date   TRIG 73 02/11/2013   Lab Results  Component Value Date   CHOLHDL 4.2 02/11/2013     Assessment & Plan  Sinusitis, acute Antibiotics, Mucinex, increase exercise, probiotics, increase fluids  Chronic headaches Worse with sinusitis, combination of tension and migraine HA, increase Topamax to 50 mg po tid, continue Norflex bid and encouraged moist heat and gentle stretching, increased rest and hydration   Allergic state Try Zyrtec q am and Benadryl q pm , Nasonex daily

## 2013-04-10 NOTE — Assessment & Plan Note (Signed)
Try Zyrtec q am and Benadryl q pm , Nasonex daily

## 2013-04-10 NOTE — Assessment & Plan Note (Signed)
Antibiotics, Mucinex, increase exercise, probiotics, increase fluids

## 2013-04-11 MED ORDER — SACCHAROMYCES BOULARDII 250 MG PO CAPS: 250.0000 mg | ORAL_CAPSULE | Freq: Every day | ORAL | Status: DC

## 2013-04-11 NOTE — Telephone Encounter (Signed)
Sent RX for [employer]

## 2013-04-24 ENCOUNTER — Other Ambulatory Visit: Payer: Self-pay | Admitting: Family Medicine

## 2013-04-24 NOTE — Telephone Encounter (Signed)
Refill request for Ativan Last filled by MD on - 02/11/13 #30 x2 Last seen - 04/08/13 F/U - 05/20/13 Please advise refill?

## 2013-05-08 ENCOUNTER — Encounter: Payer: Self-pay | Admitting: Family Medicine

## 2013-05-09 ENCOUNTER — Telehealth: Payer: Self-pay

## 2013-05-09 NOTE — Telephone Encounter (Signed)
I left a message for pt to return my call.   Md needs to know if the FMLA papers are for acute issues or something else? What dates for the paperwork?

## 2013-05-09 NOTE — Telephone Encounter (Signed)
Acute issues per the patient and the dates would be as needed.

## 2013-05-20 ENCOUNTER — Encounter: Payer: Self-pay | Admitting: Family Medicine

## 2013-05-20 ENCOUNTER — Ambulatory Visit (INDEPENDENT_AMBULATORY_CARE_PROVIDER_SITE_OTHER): Payer: 59 | Admitting: Family Medicine

## 2013-05-20 VITALS — BP 110/80 | HR 79 | Temp 98.2°F | Ht 67.0 in | Wt 224.1 lb

## 2013-05-20 DIAGNOSIS — G43909 Migraine, unspecified, not intractable, without status migrainosus: Secondary | ICD-10-CM

## 2013-05-20 DIAGNOSIS — IMO0001 Reserved for inherently not codable concepts without codable children: Secondary | ICD-10-CM

## 2013-05-20 DIAGNOSIS — J019 Acute sinusitis, unspecified: Secondary | ICD-10-CM

## 2013-05-20 DIAGNOSIS — R51 Headache: Secondary | ICD-10-CM

## 2013-05-20 MED ORDER — OXYCODONE HCL 10 MG PO TABS: ORAL_TABLET | ORAL | Status: DC

## 2013-05-20 NOTE — Patient Instructions (Addendum)
Remember to hydrate with 64 oz daily and small, frequent meals with proteines   Migraine Headache A migraine headache is an intense, throbbing pain on one or both sides of your head. A migraine can last for 30 minutes to several hours. CAUSES  The exact cause of a migraine headache is not always known. However, a migraine may be caused when nerves in the brain become irritated and release chemicals that cause inflammation. This causes pain. SYMPTOMS  Pain on one or both sides of your head.  Pulsating or throbbing pain.  Severe pain that prevents daily activities.  Pain that is aggravated by any physical activity.  Nausea, vomiting, or both.  Dizziness.  Pain with exposure to bright lights, loud noises, or activity.  General sensitivity to bright lights, loud noises, or smells. Before you get a migraine, you may get warning signs that a migraine is coming (aura). An aura may include:  Seeing flashing lights.  Seeing bright spots, halos, or zig-zag lines.  Having tunnel vision or blurred vision.  Having feelings of numbness or tingling.  Having trouble talking.  Having muscle weakness. MIGRAINE TRIGGERS  Alcohol.  Smoking.  Stress.  Menstruation.  Aged cheeses.  Foods or drinks that contain nitrates, glutamate, aspartame, or tyramine.  Lack of sleep.  Chocolate.  Caffeine.  Hunger.  Physical exertion.  Fatigue.  Medicines used to treat chest pain (nitroglycerine), birth control pills, estrogen, and some blood pressure medicines. DIAGNOSIS  A migraine headache is often diagnosed based on:  Symptoms.  Physical examination.  A CT scan or MRI of your head. TREATMENT Medicines may be given for pain and nausea. Medicines can also be given to help prevent recurrent migraines.  HOME CARE INSTRUCTIONS  Only take over-the-counter or prescription medicines for pain or discomfort as directed by your caregiver. The use of long-term narcotics is not  recommended.  Lie down in a dark, quiet room when you have a migraine.  Keep a journal to find out what may trigger your migraine headaches. For example, write down:  What you eat and drink.  How much sleep you get.  Any change to your diet or medicines.  Limit alcohol consumption.  Quit smoking if you smoke.  Get 7 to 9 hours of sleep, or as recommended by your caregiver.  Limit stress.  Keep lights dim if bright lights bother you and make your migraines worse. SEEK IMMEDIATE MEDICAL CARE IF:   Your migraine becomes severe.  You have a fever.  You have a stiff neck.  You have vision loss.  You have muscular weakness or loss of muscle control.  You start losing your balance or have trouble walking.  You feel faint or pass out.  You have severe symptoms that are different from your first symptoms. MAKE SURE YOU:   Understand these instructions.  Will watch your condition.  Will get help right away if you are not doing well or get worse. Document Released: 11/27/2005 Document Revised: 02/19/2012 Document Reviewed: 11/17/2011 [facility] Patient Information 2014 Heather Hanson, Heather Hanson.

## 2013-05-22 ENCOUNTER — Encounter: Payer: Self-pay | Admitting: Family Medicine

## 2013-05-22 DIAGNOSIS — R519 Headache, unspecified: Secondary | ICD-10-CM | POA: Insufficient documentation

## 2013-05-22 DIAGNOSIS — R51 Headache: Secondary | ICD-10-CM

## 2013-05-22 HISTORY — DX: Headache: R51

## 2013-05-22 NOTE — Assessment & Plan Note (Signed)
Worsening and persistent, referred to neurology. Encouraged small, frequent meals with protein and encouraged increased hydration.

## 2013-05-22 NOTE — Assessment & Plan Note (Addendum)
Pain is persistent. No changes to meds today

## 2013-05-22 NOTE — Assessment & Plan Note (Signed)
Bad reaction to Augmentin but feeling better now

## 2013-05-22 NOTE — Progress Notes (Signed)
Patient ID: Heather Hanson, female   DOB: 11/21/1961, 52 y.o.   MRN: [id number] SAORY CARRIERO [id number] Dec 06, 1961 05/22/2013      Progress Note-Follow Up  Subjective  Chief Complaint  Chief Complaint  Patient presents with  . Follow-up    HPI  Patient is a 52 year old Latin American female in today complaining of persistent headache. She struggled with tension and migraine but recently it but will not resolve. Denies vision or hearing changes no other neurologic complaints. She reports in the past when her headaches were bad only Demerol to break them. She is advised that we were unable to prescribe the office. No recent fevers. No chest pain, palpitations, shortness of, GI or GU complaints otherwise noted. He has had some persistent congestion but it is improved. She was on Augmentin for a sinus infection but developed a reaction to that with blisters in her mouth  Past Medical History  Diagnosis Date  . Anemia     thalassemia  . Depression   . Hyperlipidemia   . Seizure disorder     presumed secondary to ativan withdrawal -Dr Sharene Skeans  . Bursitis of right shoulder   . PUD (peptic ulcer disease)     Dr Loreta Ave ( H. Pylori)  . History of colonoscopy 2000  . Chronic insomnia   . Migraines   . Obesity   . Sinusitis, acute 04/09/2013  . Allergic state 04/09/2013  . Headache(784.0) 05/22/2013    Past Surgical History  Procedure Laterality Date  . Tonsillectomy  1972  . Abdominal hysterectomy  2000    Left ovary and tube intact    Family History  Problem Relation Age of Onset  . Cancer      Breast < 73 yo, ,Prostate <50 yo  . Coronary artery disease      <60 yo  . Diabetes      History   Social History  . Marital Status: Divorced    Spouse Name: N/A    Number of Children: 2  . Years of Education: N/A   Occupational History  . 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 Smoking status: Never Smoker   . Smokeless tobacco: Never Used  .  Alcohol Use: Yes     Comment: wine occasionally  . Drug Use: No  . Sexually Active: Not on file   Other Topics Concern  . Not on file   Social History Narrative   Occupation: Water quality scientist   Divorced     2 sons 26, 50      Never Smoked     Alcohol use-no       Current Outpatient Prescriptions on File Prior to Visit  Medication Sig Dispense Refill  . cetirizine (ZYRTEC) 10 MG tablet Take 1 tablet (10 mg total) by mouth daily as needed for allergies or rhinitis.  30 tablet  5  . colesevelam (WELCHOL) 625 MG tablet Take 1,875 mg by mouth 2 (two) times daily with a meal.      . DULoxetine (CYMBALTA) 60 MG capsule Take 1 capsule (60 mg total) by mouth daily.  30 capsule  5  . furosemide (LASIX) 20 MG tablet Take 1 tablet (20 mg total) by mouth daily as needed.  30 tablet  5  . LORazepam (ATIVAN) 2 MG tablet TAKE 1 TABLET BY MOUTH EVERY 8 HOURS AS NEEDED FOR ANXIETY  90 tablet  2  . mometasone (NASONEX) 50 MCG/ACT nasal spray Place 2 sprays  into the nose daily.  17 g  5  . NALTREXONE HCL PO Take 4 mg by mouth at bedtime.      Marland Kitchen omeprazole-sodium bicarbonate (ZEGERID) 40-1100 MG per capsule TAKE 1 CAPSULE BY MOUTH ONCE DAILY  90 capsule  3  . orphenadrine (NORFLEX) 100 MG tablet Take 1 tablet (100 mg total) by mouth 2 (two) times daily as needed for muscle spasms or pain.  60 tablet  3  . promethazine (PHENERGAN) 12.5 MG tablet Take 1 tablet (12.5 mg total) by mouth every 6 (six) hours as needed.  30 tablet  3  . rizatriptan (MAXALT) 10 MG tablet Take 1 tablet (10 mg total) by mouth as needed for migraine. May repeat in 2 hours if needed  10 tablet  1  . saccharomyces boulardii (FLORASTOR) 250 MG capsule Take 1 capsule (250 mg total) by mouth daily.  30 capsule  3  . topiramate (TOPAMAX) 50 MG tablet Take 1 tablet (50 mg total) by mouth 3 (three) times daily.  90 tablet  2   No current facility-administered medications on file prior to visit.    Allergies  Allergen Reactions  . Augmentin  (Amoxicillin-Pot Clavulanate)     Blisters in the back of throat  . Hydrocodone-Acetaminophen     REACTION: Rash  . Oxycodone-Acetaminophen     REACTION: Rash  . Propoxyphene-Acetaminophen     REACTION: rash    Review of Systems  Review of Systems  Constitutional: Positive for malaise/fatigue. Negative for fever.  HENT: Negative for congestion.   Eyes: Negative for discharge.  Respiratory: Negative for shortness of breath.   Cardiovascular: Negative for chest pain, palpitations and leg swelling.  Gastrointestinal: Negative for nausea, abdominal pain and diarrhea.  Genitourinary: Negative for dysuria.  Musculoskeletal: Negative for falls.  Skin: Negative for rash.  Neurological: Positive for headaches. Negative for loss of consciousness.  Endo/Heme/Allergies: Negative for polydipsia.  Psychiatric/Behavioral: Negative for depression and suicidal ideas. The patient is not nervous/anxious and does not have insomnia.     Objective  BP 110/80  Pulse 79  Temp(Src) 98.2 F (36.8 C) (Oral)  Ht 5\' 7"  (1.702 m)  Wt 224 lb 1.3 oz (101.642 kg)  BMI 35.09 kg/m2  SpO2 97%  Physical Exam  Physical Exam  Constitutional: She is oriented to person, place, and time and well-developed, well-nourished, and in no distress. No distress.  HENT:  Head: Normocephalic and atraumatic.  Eyes: Conjunctivae are normal.  Neck: Neck supple. No thyromegaly present.  Cardiovascular: Normal rate, regular rhythm and normal heart sounds.   No murmur heard. Pulmonary/Chest: Effort normal and breath sounds normal. She has no wheezes.  Abdominal: She exhibits no distension and no mass.  Musculoskeletal: She exhibits no edema.  Lymphadenopathy:    She has no cervical adenopathy.  Neurological: She is alert and oriented to person, place, and time.  Skin: Skin is warm and dry. No rash noted. She is not diaphoretic.  Psychiatric: Memory, affect and judgment normal.    Lab Results  Component Value Date    TSH 2.217 04/08/2013   Lab Results  Component Value Date   WBC 5.7 06/25/2012   HGB 12.0 06/25/2012   HCT 36.4 06/25/2012   MCV 66.9* 06/25/2012   PLT 243 06/25/2012   Lab Results  Component Value Date   CREATININE 0.89 02/11/2013   BUN 15 02/11/2013   NA 136 02/11/2013   K 4.2 02/11/2013   CL 106 02/11/2013   CO2 23 02/11/2013  Lab Results  Component Value Date   ALT 14 06/25/2012   AST 22 06/25/2012   ALKPHOS 86 06/25/2012   BILITOT 0.4 06/25/2012   Lab Results  Component Value Date   CHOL 297* 02/11/2013   Lab Results  Component Value Date   HDL 70 02/11/2013   Lab Results  Component Value Date   LDLCALC 212* 02/11/2013   Lab Results  Component Value Date   TRIG 73 02/11/2013   Lab Results  Component Value Date   CHOLHDL 4.2 02/11/2013     Assessment & Plan  Sinusitis, acute Bad reaction to Augmentin but feeling better now  FIBROMYALGIA Pain is persistent. No changes to meds today  Headache(784.0) Worsening and persistent, referred to neurology. Encouraged small, frequent meals with protein and encouraged increased hydration.

## 2013-06-09 ENCOUNTER — Other Ambulatory Visit: Payer: Self-pay | Admitting: Hematology & Oncology

## 2013-06-09 ENCOUNTER — Other Ambulatory Visit (HOSPITAL_BASED_OUTPATIENT_CLINIC_OR_DEPARTMENT_OTHER): Payer: 59 | Admitting: Lab

## 2013-06-09 DIAGNOSIS — R1013 Epigastric pain: Secondary | ICD-10-CM

## 2013-06-09 DIAGNOSIS — R52 Pain, unspecified: Secondary | ICD-10-CM

## 2013-06-09 LAB — CMP (CANCER CENTER ONLY)
ALT(SGPT): 17 U/L (ref 10–47)
Albumin: 4.1 g/dL (ref 3.3–5.5)
Alkaline Phosphatase: 80 U/L (ref 26–84)
CO2: 27 mEq/L (ref 18–33)
Potassium: 3.7 mEq/L (ref 3.3–4.7)
Sodium: 143 mEq/L (ref 128–145)
Total Bilirubin: 0.5 mg/dl (ref 0.20–1.60)
Total Protein: 7.8 g/dL (ref 6.4–8.1)

## 2013-06-09 LAB — CBC WITH DIFFERENTIAL (CANCER CENTER ONLY)
BASO%: 0.2 % (ref 0.0–2.0)
LYMPH%: 24.8 % (ref 14.0–48.0)
MCV: 72 fL — ABNORMAL LOW (ref 81–101)
MONO#: 0.6 10*3/uL (ref 0.1–0.9)
MONO%: 10.9 % (ref 0.0–13.0)
NEUT#: 3.4 10*3/uL (ref 1.5–6.5)
Platelets: 199 10*3/uL (ref 145–400)
RBC: 5.45 10*6/uL — ABNORMAL HIGH (ref 3.70–5.32)
RDW: 14.9 % (ref 11.1–15.7)
WBC: 5.5 10*3/uL (ref 3.9–10.0)

## 2013-06-10 ENCOUNTER — Encounter: Payer: Self-pay | Admitting: Family Medicine

## 2013-06-17 ENCOUNTER — Ambulatory Visit (INDEPENDENT_AMBULATORY_CARE_PROVIDER_SITE_OTHER): Payer: 59 | Admitting: Family Medicine

## 2013-06-17 ENCOUNTER — Encounter: Payer: Self-pay | Admitting: Family Medicine

## 2013-06-17 VITALS — BP 132/82 | HR 72 | Temp 98.1°F | Ht 67.0 in | Wt 221.1 lb

## 2013-06-17 DIAGNOSIS — F4322 Adjustment disorder with anxiety: Secondary | ICD-10-CM

## 2013-06-17 DIAGNOSIS — F418 Other specified anxiety disorders: Secondary | ICD-10-CM

## 2013-06-17 DIAGNOSIS — M549 Dorsalgia, unspecified: Secondary | ICD-10-CM

## 2013-06-17 DIAGNOSIS — K5900 Constipation, unspecified: Secondary | ICD-10-CM

## 2013-06-17 DIAGNOSIS — F341 Dysthymic disorder: Secondary | ICD-10-CM

## 2013-06-17 DIAGNOSIS — K219 Gastro-esophageal reflux disease without esophagitis: Secondary | ICD-10-CM

## 2013-06-17 DIAGNOSIS — N951 Menopausal and female climacteric states: Secondary | ICD-10-CM

## 2013-06-17 DIAGNOSIS — R609 Edema, unspecified: Secondary | ICD-10-CM

## 2013-06-17 DIAGNOSIS — G43909 Migraine, unspecified, not intractable, without status migrainosus: Secondary | ICD-10-CM

## 2013-06-17 HISTORY — DX: Gastro-esophageal reflux disease without esophagitis: K21.9

## 2013-06-17 MED ORDER — OXYCODONE HCL 10 MG PO TABS: ORAL_TABLET | ORAL | Status: DC

## 2013-06-17 MED ORDER — LINACLOTIDE 145 MCG PO CAPS: 145.0000 ug | ORAL_CAPSULE | Freq: Every day | ORAL | Status: DC

## 2013-06-17 MED ORDER — LORAZEPAM 2 MG PO TABS: 2.0000 mg | ORAL_TABLET | Freq: Four times a day (QID) | ORAL | Status: DC | PRN

## 2013-06-17 MED ORDER — VENLAFAXINE HCL 50 MG PO TABS: 50.0000 mg | ORAL_TABLET | Freq: Three times a day (TID) | ORAL | Status: DC

## 2013-06-17 MED ORDER — DEXLANSOPRAZOLE 60 MG PO CPDR: 60.0000 mg | DELAYED_RELEASE_CAPSULE | Freq: Every day | ORAL | Status: DC

## 2013-06-17 MED ORDER — FUROSEMIDE 20 MG PO TABS: 20.0000 mg | ORAL_TABLET | Freq: Every day | ORAL | Status: DC | PRN

## 2013-06-17 MED ORDER — ORPHENADRINE CITRATE ER 100 MG PO TB12: 100.0000 mg | ORAL_TABLET | Freq: Two times a day (BID) | ORAL | Status: DC | PRN

## 2013-06-17 NOTE — Assessment & Plan Note (Signed)
Given rx for Dexilant which has helped in the past

## 2013-06-17 NOTE — Assessment & Plan Note (Signed)
recnet upper back pain and chest pain worse with movement. Given a refill on her muscle relaxer.

## 2013-06-17 NOTE — Patient Instructions (Addendum)
Linzess 145 mcg daily  Constipation, Adult Constipation is when a person has fewer than 3 bowel movements a week; has difficulty having a bowel movement; or has stools that are dry, hard, or larger than normal. As people grow older, constipation is more common. If you try to fix constipation with medicines that make you have a bowel movement (laxatives), the problem may get worse. Long-term laxative use may cause the muscles of the colon to become weak. A low-fiber diet, not taking in enough fluids, and taking certain medicines may make constipation worse. CAUSES   Certain medicines, such as antidepressants, pain medicine, iron supplements, antacids, and water pills.   Certain diseases, such as diabetes, irritable bowel syndrome (IBS), thyroid disease, or depression.   Not drinking enough water.   Not eating enough fiber-rich foods.   Stress or travel.  Lack of physical activity or exercise.  Not going to the restroom when there is the urge to have a bowel movement.  Ignoring the urge to have a bowel movement.  Using laxatives too much. SYMPTOMS   Having fewer than 3 bowel movements a week.   Straining to have a bowel movement.   Having hard, dry, or larger than normal stools.   Feeling full or bloated.   Pain in the lower abdomen.  Not feeling relief after having a bowel movement. DIAGNOSIS  Your caregiver will take a medical history and perform a physical exam. Further testing may be done for severe constipation. Some tests may include:   A barium enema X-ray to examine your rectum, colon, and sometimes, your small intestine.  A sigmoidoscopy to examine your lower colon.  A colonoscopy to examine your entire colon. TREATMENT  Treatment will depend on the severity of your constipation and what is causing it. Some dietary treatments include drinking more fluids and eating more fiber-rich foods. Lifestyle treatments may include regular exercise. If these diet and  lifestyle recommendations do not help, your caregiver may recommend taking over-the-counter laxative medicines to help you have bowel movements. Prescription medicines may be prescribed if over-the-counter medicines do not work.  HOME CARE INSTRUCTIONS   Increase dietary fiber in your diet, such as fruits, vegetables, whole grains, and beans. Limit high-fat and processed sugars in your diet, such as [place] fries, hamburgers, cookies, candies, and soda.   A fiber supplement may be added to your diet if you cannot get enough fiber from foods.   Drink enough fluids to keep your urine clear or pale yellow.   Exercise regularly or as directed by your caregiver.   Go to the restroom when you have the urge to go. Do not hold it.  Only take medicines as directed by your caregiver. Do not take other medicines for constipation without talking to your caregiver first. SEEK IMMEDIATE MEDICAL CARE IF:   You have bright red blood in your stool.   Your constipation lasts for more than 4 days or gets worse.   You have abdominal or rectal pain.   You have thin, pencil-like stools.  You have unexplained weight loss. MAKE SURE YOU:   Understand these instructions.  Will watch your condition.  Will get help right away if you are not doing well or get worse. Document Released: 08/25/2004 Document Revised: 02/19/2012 Document Reviewed: 10/31/2011 [facility] Patient Information 2014 [place], [place].

## 2013-06-17 NOTE — Assessment & Plan Note (Signed)
With perimenopausal hot flashes as well. Will try Venlafaxine 50 mg tid.

## 2013-06-17 NOTE — Progress Notes (Signed)
Patient ID: Heather Hanson, female   DOB: [birth year], 52 y.o.   MRN: [id number] Heather Hanson [id number] 28-Jul-1961 06/17/2013      Progress Note-Follow Up  Subjective  Chief Complaint  Chief Complaint  Patient presents with  . Follow-up    4 week    HPI  Recommend female who is in today with multiple complaints. She continues to have recurrent heartburn but has had a good response to Paxil in the past. Is having some upper back and chest pain as well most notably with movement and excessive use. No shortness of breath or palpitations. No fevers or chills. Is complaining of chronic constipation as well as chronic reflux. Is also complaining of excessive anxiety and depression. Is noting perimenopausal hot flushes as well no suicidal ideation.  Past Medical History  Diagnosis Date  . Anemia     thalassemia  . Depression   . Hyperlipidemia   . Seizure disorder     presumed secondary to ativan withdrawal -Dr Sharene Skeans  . Bursitis of right shoulder   . PUD (peptic ulcer disease)     Dr Loreta Ave ( H. Pylori)  . History of colonoscopy 2000  . Chronic insomnia   . Migraines   . Obesity   . Sinusitis, acute 04/09/2013  . Allergic state 04/09/2013  . Headache(784.0) 05/22/2013    Past Surgical History  Procedure Laterality Date  . Tonsillectomy  1972  . Abdominal hysterectomy  2000    Left ovary and tube intact    Family History  Problem Relation Age of Onset  . Cancer      Breast < 57 yo, ,Prostate <50 yo  . Coronary artery disease      <60 yo  . Diabetes      History   Social History  . Marital Status: Divorced    Spouse Name: N/A    Number of Children: 2  . Years of Education: N/A   Occupational History  . 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 Smoking status: Never Smoker   . Smokeless tobacco: Never Used  . Alcohol Use: Yes     Comment: wine occasionally  . Drug Use: No  . Sexually Active: Not on file   Other Topics Concern  .  Not on file   Social History Narrative   Occupation: Water quality scientist   Divorced     2 sons 26, 82      Never Smoked     Alcohol use-no       Current Outpatient Prescriptions on File Prior to Visit  Medication Sig Dispense Refill  . cetirizine (ZYRTEC) 10 MG tablet Take 1 tablet (10 mg total) by mouth daily as needed for allergies or rhinitis.  30 tablet  5  . furosemide (LASIX) 20 MG tablet Take 1 tablet (20 mg total) by mouth daily as needed.  30 tablet  5  . LORazepam (ATIVAN) 2 MG tablet TAKE 1 TABLET BY MOUTH EVERY 8 HOURS AS NEEDED FOR ANXIETY  90 tablet  2  . mometasone (NASONEX) 50 MCG/ACT nasal spray Place 2 sprays into the nose daily.  17 g  5  . NALTREXONE HCL PO Take 4 mg by mouth at bedtime.      . orphenadrine (NORFLEX) 100 MG tablet Take 1 tablet (100 mg total) by mouth 2 (two) times daily as needed for muscle spasms or pain.  60 tablet  3  . Oxycodone HCl 10 MG TABS  1-2 tabs po qhs prn pain  60 tablet  0  . promethazine (PHENERGAN) 12.5 MG tablet Take 1 tablet (12.5 mg total) by mouth every 6 (six) hours as needed.  30 tablet  3  . saccharomyces boulardii (FLORASTOR) 250 MG capsule Take 1 capsule (250 mg total) by mouth daily.  30 capsule  3  . topiramate (TOPAMAX) 50 MG tablet Take 1 tablet (50 mg total) by mouth 3 (three) times daily.  90 tablet  2  . omeprazole-sodium bicarbonate (ZEGERID) 40-1100 MG per capsule TAKE 1 CAPSULE BY MOUTH ONCE DAILY  90 capsule  3   No current facility-administered medications on file prior to visit.    Allergies  Allergen Reactions  . Augmentin (Amoxicillin-Pot Clavulanate)     Blisters in the back of throat  . Hydrocodone-Acetaminophen     REACTION: Rash  . Oxycodone-Acetaminophen     REACTION: Rash  . Propoxyphene-Acetaminophen     REACTION: rash    Review of Systems  Review of Systems  Constitutional: Positive for malaise/fatigue. Negative for fever.  HENT: Negative for congestion.   Eyes: Negative for pain and discharge.   Respiratory: Negative for shortness of breath.   Cardiovascular: Negative for chest pain, palpitations and leg swelling.  Gastrointestinal: Negative for nausea, abdominal pain and diarrhea.  Genitourinary: Negative for dysuria.  Musculoskeletal: Negative for falls.  Skin: Negative for rash.  Neurological: Negative for loss of consciousness and headaches.  Endo/Heme/Allergies: Negative for polydipsia.  Psychiatric/Behavioral: Positive for depression. Negative for suicidal ideas. The patient has insomnia. The patient is not nervous/anxious.     Objective  BP 132/82  Pulse 72  Temp(Src) 98.1 F (36.7 C) (Oral)  Ht 5\' 7"  (1.702 m)  Wt 221 lb 1.3 oz (100.281 kg)  BMI 34.62 kg/m2  SpO2 98%  Physical Exam  Physical Exam  Constitutional: She is oriented to person, place, and time and well-developed, well-nourished, and in no distress. No distress.  HENT:  Head: Normocephalic and atraumatic.  Eyes: Conjunctivae are normal.  Neck: Neck supple. No thyromegaly present.  Cardiovascular: Normal rate and regular rhythm.  Exam reveals no gallop.   No murmur heard. Pulmonary/Chest: Effort normal and breath sounds normal. She has no wheezes.  Abdominal: She exhibits no distension and no mass.  Musculoskeletal: She exhibits no edema.  Lymphadenopathy:    She has no cervical adenopathy.  Neurological: She is alert and oriented to person, place, and time. Gait normal.  Skin: Skin is warm and dry. No rash noted. She is not diaphoretic.  Psychiatric: Memory, affect and judgment normal.    Lab Results  Component Value Date   TSH 2.217 04/08/2013   Lab Results  Component Value Date   WBC 5.5 06/09/2013   HGB 12.5 06/09/2013   HCT 39.2 06/09/2013   MCV 72* 06/09/2013   PLT 199 06/09/2013   Lab Results  Component Value Date   CREATININE 0.7 06/09/2013   BUN 12 06/09/2013   NA 143 06/09/2013   K 3.7 06/09/2013   CL 108 06/09/2013   CO2 27 06/09/2013   Lab Results  Component Value Date    ALT 14 06/25/2012   AST 31 06/09/2013   ALKPHOS 80 06/09/2013   BILITOT 0.50 06/09/2013   Lab Results  Component Value Date   CHOL 297* 02/11/2013   Lab Results  Component Value Date   HDL 70 02/11/2013   Lab Results  Component Value Date   LDLCALC 212* 02/11/2013   Lab Results  Component  Value Date   TRIG 73 02/11/2013   Lab Results  Component Value Date   CHOLHDL 4.2 02/11/2013     Assessment & Plan  CONSTIPATION Given samples of Linzess 145 mcg daily by her gastroenterologist and it worked well, given samples today and rx sent to pharmacy  GERD (gastroesophageal reflux disease) Given rx for Dexilant which has helped in the past  ADJUSTMENT DISORDER WITH ANXIETY With perimenopausal hot flashes as well. Will try Venlafaxine 50 mg tid.  Back pain recnet upper back pain and chest pain worse with movement. Given a refill on her muscle relaxer.

## 2013-06-17 NOTE — Assessment & Plan Note (Signed)
Given samples of Linzess 145 mcg daily by her gastroenterologist and it worked well, given samples today and rx sent to pharmacy

## 2013-06-24 ENCOUNTER — Ambulatory Visit: Payer: 59 | Admitting: Neurology

## 2013-07-15 ENCOUNTER — Ambulatory Visit (INDEPENDENT_AMBULATORY_CARE_PROVIDER_SITE_OTHER): Payer: 59 | Admitting: Family Medicine

## 2013-07-15 ENCOUNTER — Encounter: Payer: Self-pay | Admitting: Family Medicine

## 2013-07-15 VITALS — BP 112/80 | HR 68 | Temp 98.1°F | Ht 67.0 in | Wt 216.1 lb

## 2013-07-15 DIAGNOSIS — G43909 Migraine, unspecified, not intractable, without status migrainosus: Secondary | ICD-10-CM

## 2013-07-15 DIAGNOSIS — E785 Hyperlipidemia, unspecified: Secondary | ICD-10-CM

## 2013-07-15 DIAGNOSIS — M2550 Pain in unspecified joint: Secondary | ICD-10-CM

## 2013-07-15 DIAGNOSIS — F418 Other specified anxiety disorders: Secondary | ICD-10-CM

## 2013-07-15 DIAGNOSIS — D649 Anemia, unspecified: Secondary | ICD-10-CM

## 2013-07-15 DIAGNOSIS — N912 Amenorrhea, unspecified: Secondary | ICD-10-CM

## 2013-07-15 DIAGNOSIS — N951 Menopausal and female climacteric states: Secondary | ICD-10-CM

## 2013-07-15 DIAGNOSIS — R634 Abnormal weight loss: Secondary | ICD-10-CM

## 2013-07-15 DIAGNOSIS — K219 Gastro-esophageal reflux disease without esophagitis: Secondary | ICD-10-CM

## 2013-07-15 DIAGNOSIS — A048 Other specified bacterial intestinal infections: Secondary | ICD-10-CM

## 2013-07-15 DIAGNOSIS — F341 Dysthymic disorder: Secondary | ICD-10-CM

## 2013-07-15 DIAGNOSIS — R1013 Epigastric pain: Secondary | ICD-10-CM

## 2013-07-15 LAB — LIPID PANEL
Cholesterol: 321 mg/dL — ABNORMAL HIGH (ref 0–200)
Total CHOL/HDL Ratio: 4.7 Ratio
Triglycerides: 133 mg/dL (ref <150)
VLDL: 27 mg/dL (ref 0–40)

## 2013-07-15 MED ORDER — TOPIRAMATE 50 MG PO TABS: 50.0000 mg | ORAL_TABLET | Freq: Three times a day (TID) | ORAL | Status: DC

## 2013-07-15 MED ORDER — VENLAFAXINE HCL 50 MG PO TABS: 50.0000 mg | ORAL_TABLET | Freq: Three times a day (TID) | ORAL | Status: DC

## 2013-07-15 MED ORDER — LORAZEPAM 2 MG PO TABS: 2.0000 mg | ORAL_TABLET | Freq: Four times a day (QID) | ORAL | Status: DC | PRN

## 2013-07-16 ENCOUNTER — Encounter (HOSPITAL_BASED_OUTPATIENT_CLINIC_OR_DEPARTMENT_OTHER): Payer: Self-pay

## 2013-07-16 ENCOUNTER — Ambulatory Visit (HOSPITAL_BASED_OUTPATIENT_CLINIC_OR_DEPARTMENT_OTHER)
Admission: RE | Admit: 2013-07-16 | Discharge: 2013-07-16 | Disposition: A | Discharge status: Home / Self Care | Payer: 59 | Source: Ambulatory Visit | Attending: Family Medicine | Admitting: Family Medicine

## 2013-07-16 DIAGNOSIS — K8020 Calculus of gallbladder without cholecystitis without obstruction: Secondary | ICD-10-CM | POA: Insufficient documentation

## 2013-07-16 DIAGNOSIS — K219 Gastro-esophageal reflux disease without esophagitis: Secondary | ICD-10-CM

## 2013-07-16 DIAGNOSIS — R141 Gas pain: Secondary | ICD-10-CM | POA: Insufficient documentation

## 2013-07-16 DIAGNOSIS — M169 Osteoarthritis of hip, unspecified: Secondary | ICD-10-CM | POA: Insufficient documentation

## 2013-07-16 DIAGNOSIS — M1610 Unilateral primary osteoarthritis, unspecified hip: Secondary | ICD-10-CM | POA: Insufficient documentation

## 2013-07-16 DIAGNOSIS — Z90710 Acquired absence of both cervix and uterus: Secondary | ICD-10-CM | POA: Insufficient documentation

## 2013-07-16 DIAGNOSIS — R142 Eructation: Secondary | ICD-10-CM | POA: Insufficient documentation

## 2013-07-16 DIAGNOSIS — R634 Abnormal weight loss: Secondary | ICD-10-CM | POA: Insufficient documentation

## 2013-07-16 DIAGNOSIS — R1013 Epigastric pain: Secondary | ICD-10-CM | POA: Insufficient documentation

## 2013-07-16 LAB — H. PYLORI ANTIBODY, IGG: H Pylori IgG: 1.13 {ISR} — ABNORMAL HIGH

## 2013-07-16 LAB — ANTI-NUCLEAR AB-TITER (ANA TITER)

## 2013-07-16 LAB — LUTEINIZING HORMONE: LH: 40.4 m[IU]/mL

## 2013-07-16 MED ORDER — IOHEXOL 300 MG/ML  SOLN
100.0000 mL | Freq: Once | INTRAMUSCULAR | Status: AC | PRN
  Administered 2013-07-16: 100 mL via INTRAVENOUS

## 2013-07-17 MED ORDER — CLARITHROMYCIN 500 MG PO TABS: 500.0000 mg | ORAL_TABLET | Freq: Two times a day (BID) | ORAL | Status: DC

## 2013-07-17 MED ORDER — AMOXICILLIN 500 MG PO CAPS: 500.0000 mg | ORAL_CAPSULE | Freq: Two times a day (BID) | ORAL | Status: DC

## 2013-07-17 NOTE — Progress Notes (Signed)
Quick Note:  Patient Informed and voiced understanding ______ 

## 2013-07-17 NOTE — Progress Notes (Signed)
Quick Note:  Patient Informed and voiced understanding..  Pt states that Augmentin "messed her up" so she doesn't think she can take Amoxicillin? Please advise? ______

## 2013-07-18 ENCOUNTER — Encounter: Payer: Self-pay | Admitting: Family Medicine

## 2013-07-19 ENCOUNTER — Encounter: Payer: Self-pay | Admitting: Family Medicine

## 2013-07-19 DIAGNOSIS — A048 Other specified bacterial intestinal infections: Secondary | ICD-10-CM | POA: Insufficient documentation

## 2013-07-19 HISTORY — DX: Other specified bacterial intestinal infections: A04.8

## 2013-07-19 NOTE — Assessment & Plan Note (Signed)
May benefit from rheumatology consultation, will discuss with patient at next visit.

## 2013-07-19 NOTE — Assessment & Plan Note (Signed)
Started on Amoxicillin, Biaxin and Omeprazole, start a probiotic

## 2013-07-20 NOTE — Assessment & Plan Note (Signed)
Due for surveillance colonoscopy and has a defect noted on CT abdomen that is concerning for possible lesion. Patient agrees to proceed with colonoscopy based on this result prior to that she had been refusing. She agrees to schedule appt with her gastroenterologist, Dr Loreta Ave.

## 2013-07-20 NOTE — Assessment & Plan Note (Signed)
Resolved with most recent blood work

## 2013-07-20 NOTE — Progress Notes (Signed)
Patient ID: Heather Hanson, female   DOB: 1961-08-21, 52 y.o.   MRN: [id number] Heather Hanson [id number] [birth year] 07/20/2013      Progress Note-Follow Up  Subjective  Chief Complaint  Chief Complaint  Patient presents with  . Follow-up    4 week    HPI  Patient is a 52 year old [place] American female who is in today for followup. She continues to feel poorly. She is eating very little and acknowledges worsening dyspepsia and nausea. She has very little appetite when she eats the nausea worsens. No actual vomiting at the moment. No fevers or chills. She continues to show with headaches back pain and generalized myalgias as well but these are not worsening. No palpitations or shortness of breath. Taking medications as prescribed.  Past Medical History  Diagnosis Date  . Anemia     thalassemia  . Depression   . Hyperlipidemia   . Seizure disorder     presumed secondary to ativan withdrawal -Dr Sharene Skeans  . Bursitis of right shoulder   . PUD (peptic ulcer disease)     Dr Loreta Ave ( H. Pylori)  . History of colonoscopy 2000  . Chronic insomnia   . Migraines   . Obesity   . Sinusitis, acute 04/09/2013  . Allergic state 04/09/2013  . Headache(784.0) 05/22/2013  . GERD (gastroesophageal reflux disease) 06/17/2013  . H. pylori infection 07/19/2013    Past Surgical History  Procedure Laterality Date  . Tonsillectomy  1972  . Abdominal hysterectomy  2000    Left ovary and tube intact    Family History  Problem Relation Age of Onset  . Cancer      Breast < 5 yo, ,Prostate <50 yo  . Coronary artery disease      <60 yo  . Diabetes      History   Social History  . Marital Status: Divorced    Spouse Name: N/A    Number of Children: 2  . Years of Education: N/A   Occupational History  . 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 Smoking status: Never Smoker   . Smokeless tobacco: Never Used  . Alcohol Use: Yes     Comment: wine occasionally  .  Drug Use: No  . Sexually Active: Not on file   Other Topics Concern  . Not on file   Social History Narrative   Occupation: Water quality scientist   Divorced     2 sons 26, 63      Never Smoked     Alcohol use-no       Current Outpatient Prescriptions on File Prior to Visit  Medication Sig Dispense Refill  . cetirizine (ZYRTEC) 10 MG tablet Take 1 tablet (10 mg total) by mouth daily as needed for allergies or rhinitis.  30 tablet  5  . dexlansoprazole (DEXILANT) 60 MG capsule Take 1 capsule (60 mg total) by mouth daily.  30 capsule  5  . furosemide (LASIX) 20 MG tablet Take 1 tablet (20 mg total) by mouth daily as needed.  30 tablet  5  . Linaclotide (LINZESS) 145 MCG CAPS Take 1 capsule (145 mcg total) by mouth daily.  30 capsule  3  . mometasone (NASONEX) 50 MCG/ACT nasal spray Place 2 sprays into the nose daily.  17 g  5  . NALTREXONE HCL PO Take 4 mg by mouth at bedtime.      . orphenadrine (NORFLEX) 100 MG tablet Take 1 tablet (  100 mg total) by mouth 2 (two) times daily as needed for muscle spasms or pain.  60 tablet  3  . Oxycodone HCl 10 MG TABS 1-2 tabs po qhs prn pain  60 tablet  0  . promethazine (PHENERGAN) 12.5 MG tablet Take 1 tablet (12.5 mg total) by mouth every 6 (six) hours as needed.  30 tablet  3  . saccharomyces boulardii (FLORASTOR) 250 MG capsule Take 1 capsule (250 mg total) by mouth daily.  30 capsule  3   No current facility-administered medications on file prior to visit.    Allergies  Allergen Reactions  . Augmentin (Amoxicillin-Pot Clavulanate)     Blisters in the back of throat  . Hydrocodone-Acetaminophen     REACTION: Rash  . Oxycodone-Acetaminophen     REACTION: Rash  . Propoxyphene-Acetaminophen     REACTION: rash    Review of Systems  Review of Systems  Constitutional: Positive for malaise/fatigue. Negative for fever.  HENT: Negative for congestion.   Eyes: Negative for pain and discharge.  Respiratory: Negative for shortness of breath.    Cardiovascular: Negative for chest pain, palpitations and leg swelling.  Gastrointestinal: Positive for heartburn, nausea and abdominal pain. Negative for diarrhea.  Genitourinary: Negative for dysuria.  Musculoskeletal: Positive for myalgias, back pain and joint pain. Negative for falls.  Skin: Negative for rash.  Neurological: Negative for loss of consciousness and headaches.  Endo/Heme/Allergies: Negative for polydipsia.  Psychiatric/Behavioral: Negative for depression and suicidal ideas. The patient is not nervous/anxious and does not have insomnia.     Objective  BP 112/80  Pulse 68  Temp(Src) 98.1 F (36.7 C) (Oral)  Ht 5\' 7"  (1.702 m)  Wt 216 lb 1.3 oz (98.013 kg)  BMI 33.83 kg/m2  SpO2 99%  Physical Exam  Physical Exam  Constitutional: She is oriented to person, place, and time and well-developed, well-nourished, and in no distress. No distress.  HENT:  Head: Normocephalic and atraumatic.  Eyes: Conjunctivae are normal.  Neck: Neck supple. No thyromegaly present.  Cardiovascular: Normal rate, regular rhythm and normal heart sounds.   No murmur heard. Pulmonary/Chest: Effort normal and breath sounds normal. She has no wheezes.  Abdominal: She exhibits no distension and no mass.  Musculoskeletal: She exhibits no edema.  Lymphadenopathy:    She has no cervical adenopathy.  Neurological: She is alert and oriented to person, place, and time.  Skin: Skin is warm and dry. No rash noted. She is not diaphoretic.  Psychiatric: Memory, affect and judgment normal.    Lab Results  Component Value Date   TSH 2.217 04/08/2013   Lab Results  Component Value Date   WBC 5.5 06/09/2013   HGB 12.5 06/09/2013   HCT 39.2 06/09/2013   MCV 72* 06/09/2013   PLT 199 06/09/2013   Lab Results  Component Value Date   CREATININE 0.7 06/09/2013   BUN 12 06/09/2013   NA 143 06/09/2013   K 3.7 06/09/2013   CL 108 06/09/2013   CO2 27 06/09/2013   Lab Results  Component Value Date   ALT 17  06/09/2013   AST 31 06/09/2013   ALKPHOS 80 06/09/2013   BILITOT 0.50 06/09/2013   Lab Results  Component Value Date   CHOL 321* 07/15/2013   Lab Results  Component Value Date   HDL 68 07/15/2013   Lab Results  Component Value Date   LDLCALC 226* 07/15/2013   Lab Results  Component Value Date   TRIG 133 07/15/2013   Lab  Results  Component Value Date   CHOLHDL 4.7 07/15/2013     Assessment & Plan  Polyarthralgia May benefit from rheumatology consultation, will discuss with patient at next visit.  H. pylori infection Started on Amoxicillin, Biaxin and Omeprazole, start a probiotic  GERD (gastroesophageal reflux disease) Due for surveillance colonoscopy and has a defect noted on CT abdomen that is concerning for possible lesion. Patient agrees to proceed with colonoscopy based on this result prior to that she had been refusing. She agrees to schedule appt with her gastroenterologist, Dr Loreta Ave.   ANEMIA-NOS Resolved with most recent blood work

## 2013-07-22 ENCOUNTER — Other Ambulatory Visit: Payer: Self-pay | Admitting: Gastroenterology

## 2013-07-22 DIAGNOSIS — R109 Unspecified abdominal pain: Secondary | ICD-10-CM

## 2013-07-29 ENCOUNTER — Ambulatory Visit (HOSPITAL_BASED_OUTPATIENT_CLINIC_OR_DEPARTMENT_OTHER)
Admission: RE | Admit: 2013-07-29 | Discharge: 2013-07-29 | Disposition: A | Discharge status: Home / Self Care | Payer: 59 | Source: Ambulatory Visit | Attending: Gastroenterology | Admitting: Gastroenterology

## 2013-07-29 ENCOUNTER — Other Ambulatory Visit (HOSPITAL_COMMUNITY): Payer: 59

## 2013-07-29 ENCOUNTER — Ambulatory Visit: Payer: 59 | Admitting: Neurology

## 2013-07-29 DIAGNOSIS — R109 Unspecified abdominal pain: Secondary | ICD-10-CM

## 2013-07-29 DIAGNOSIS — K7689 Other specified diseases of liver: Secondary | ICD-10-CM | POA: Insufficient documentation

## 2013-07-29 DIAGNOSIS — R935 Abnormal findings on diagnostic imaging of other abdominal regions, including retroperitoneum: Secondary | ICD-10-CM | POA: Insufficient documentation

## 2013-08-12 ENCOUNTER — Encounter: Payer: 59 | Admitting: Family Medicine

## 2013-08-14 ENCOUNTER — Ambulatory Visit (INDEPENDENT_AMBULATORY_CARE_PROVIDER_SITE_OTHER): Payer: 59 | Admitting: General Surgery

## 2013-08-19 ENCOUNTER — Ambulatory Visit (INDEPENDENT_AMBULATORY_CARE_PROVIDER_SITE_OTHER): Payer: Commercial Managed Care - PPO | Admitting: General Surgery

## 2013-08-19 ENCOUNTER — Encounter (INDEPENDENT_AMBULATORY_CARE_PROVIDER_SITE_OTHER): Payer: Self-pay | Admitting: General Surgery

## 2013-08-19 VITALS — BP 130/90 | HR 72 | Temp 97.7°F | Resp 14 | Ht 68.0 in | Wt 214.0 lb

## 2013-08-19 DIAGNOSIS — K8020 Calculus of gallbladder without cholecystitis without obstruction: Secondary | ICD-10-CM

## 2013-08-19 NOTE — Progress Notes (Signed)
Patient ID: Rachael Fee, female   DOB: 02/04/1961, 52 y.o.   MRN: [id number]  Chief Complaint  Patient presents with  . New Evaluation    eval cholelithiais    HPI ORETTA BERKLAND is a 52 y.o. female.  Referred by Dr Loreta Ave HPI 52 yof who preents with loose stools, ruq abdominal pain made worse with eating.  She has no fevers, vomiting.  She has bloating and pain is getting worse.  This led her to undergo ct scan on 8/6 that shows faint gallstones with possible duodenal defect.  Since then she underwent egd and colonoscopy that are normal.She does have history of gerd but this is very different to her. An abdominal ultrasound shows small echogenic foci in the gallbladder fundus.  There is no gbw thickening.  No pericholecystic fluid and negative sonographic Murphys sign.  CBD is normal.  She comes in today and the first thing she says is im here for you to take my gallbladder out.  She works as a Water quality scientist.  Past Medical History  Diagnosis Date  . Anemia     thalassemia  . Depression   . Hyperlipidemia   . Seizure disorder     presumed secondary to ativan withdrawal -Dr Sharene Skeans  . Bursitis of right shoulder   . PUD (peptic ulcer disease)     Dr Loreta Ave ( H. Pylori)  . History of colonoscopy 2000  . Chronic insomnia   . Migraines   . Obesity   . Sinusitis, acute 04/09/2013  . Allergic state 04/09/2013  . Headache(784.0) 05/22/2013  . GERD (gastroesophageal reflux disease) 06/17/2013  . H. pylori infection 07/19/2013    Past Surgical History  Procedure Laterality Date  . Tonsillectomy  1972  . Abdominal hysterectomy  2000    Left ovary and tube intact    Family History  Problem Relation Age of Onset  . Cancer      Breast < 17 yo, ,Prostate <50 yo  . Coronary artery disease      <60 yo  . Diabetes    . Diabetes Mother   . Cancer Father     prostate  . COPD Father     Social History History  Substance Use Topics  . Smoking status: Never Smoker   . Smokeless  tobacco: Never Used  . Alcohol Use: Yes     Comment: wine occasionally    Allergies  Allergen Reactions  . Augmentin [Amoxicillin-Pot Clavulanate]     Blisters in the back of throat  . Hydrocodone-Acetaminophen     REACTION: Rash  . Oxycodone-Acetaminophen     REACTION: Rash  . Propoxyphene-Acetaminophen     REACTION: rash    Current Outpatient Prescriptions  Medication Sig Dispense Refill  . cetirizine (ZYRTEC) 10 MG tablet Take 1 tablet (10 mg total) by mouth daily as needed for allergies or rhinitis.  30 tablet  5  . dexlansoprazole (DEXILANT) 60 MG capsule Take 1 capsule (60 mg total) by mouth daily.  30 capsule  5  . furosemide (LASIX) 20 MG tablet Take 1 tablet (20 mg total) by mouth daily as needed.  30 tablet  5  . LORazepam (ATIVAN) 2 MG tablet Take 1 tablet (2 mg total) by mouth every 6 (six) hours as needed for anxiety.  120 tablet  1  . mometasone (NASONEX) 50 MCG/ACT nasal spray Place 2 sprays into the nose daily.  17 g  5  . NALTREXONE HCL PO Take 4 mg by mouth at  bedtime.      . orphenadrine (NORFLEX) 100 MG tablet Take 1 tablet (100 mg total) by mouth 2 (two) times daily as needed for muscle spasms or pain.  60 tablet  3  . Oxycodone HCl 10 MG TABS 1-2 tabs po qhs prn pain  60 tablet  0  . promethazine (PHENERGAN) 12.5 MG tablet Take 1 tablet (12.5 mg total) by mouth every 6 (six) hours as needed.  30 tablet  3  . saccharomyces boulardii (FLORASTOR) 250 MG capsule Take 1 capsule (250 mg total) by mouth daily.  30 capsule  3  . topiramate (TOPAMAX) 50 MG tablet Take 1 tablet (50 mg total) by mouth 3 (three) times daily.  90 tablet  2  . venlafaxine (EFFEXOR) 50 MG tablet Take 1 tablet (50 mg total) by mouth 3 (three) times daily.  90 tablet  2  . Linaclotide (LINZESS) 145 MCG CAPS Take 1 capsule (145 mcg total) by mouth daily.  30 capsule  3   No current facility-administered medications for this visit.    Review of Systems Review of Systems  Constitutional:  Negative for fever, chills and unexpected weight change.  HENT: Negative for hearing loss, congestion, sore throat, trouble swallowing and voice change.   Eyes: Negative for visual disturbance.  Respiratory: Negative for cough and wheezing.   Cardiovascular: Negative for chest pain, palpitations and leg swelling.  Gastrointestinal: Positive for abdominal pain and diarrhea. Negative for nausea, vomiting, constipation, blood in stool, abdominal distention and anal bleeding.  Genitourinary: Negative for hematuria, vaginal bleeding and difficulty urinating.  Musculoskeletal: Negative for arthralgias.  Skin: Negative for rash and wound.  Neurological: Negative for seizures, syncope and headaches.  Hematological: Negative for adenopathy. Does not bruise/bleed easily.  Psychiatric/Behavioral: Negative for confusion.    Blood pressure 130/90, pulse 72, temperature 97.7 F (36.5 C), temperature source Temporal, resp. rate 14, height 5\' 8"  (1.727 m), weight 214 lb (97.07 kg).  Physical Exam Physical Exam  Vitals reviewed. Constitutional: She appears well-developed and well-nourished.  Eyes: No scleral icterus.  Cardiovascular: Normal rate, regular rhythm and normal heart sounds.   Pulmonary/Chest: Effort normal and breath sounds normal. She has no wheezes. She has no rales.  Abdominal: Soft. Bowel sounds are normal. There is tenderness (mild ruq).    Lymphadenopathy:    She has no cervical adenopathy.    Data Reviewed CT, [place], Dr. Trilby Drummer notes, egd and colonoscopy reports  Assessment    Symptomatic cholelithiasis    Plan    Lap cholecystectomy I do think some of her symptoms are coming from her gallbladder.  I don't think I will help loose stools and actually might make that worse. I discussed the procedure in detail.   We discussed the risks and benefits of a laparoscopic cholecystectomy and possible cholangiogram including, but not limited to bleeding, infection, injury to  surrounding structures such as the intestine or liver, bile leak, retained gallstones, need to convert to an open procedure, prolonged diarrhea, blood clots such as  DVT, common bile duct injury, anesthesia risks, and possible need for additional procedures.  The likelihood of improvement in symptoms and return to the patient's normal status is good. We discussed the typical post-operative recovery course. I discussed she may have additional ports to lyse some midline adhesions also.       Valor Quaintance 08/19/2013, 10:50 PM

## 2013-08-26 ENCOUNTER — Ambulatory Visit (INDEPENDENT_AMBULATORY_CARE_PROVIDER_SITE_OTHER): Payer: 59 | Admitting: Family Medicine

## 2013-08-26 ENCOUNTER — Encounter: Payer: Self-pay | Admitting: Family Medicine

## 2013-08-26 VITALS — BP 132/86 | HR 67 | Temp 98.0°F | Resp 16 | Ht 67.0 in | Wt 213.2 lb

## 2013-08-26 DIAGNOSIS — R197 Diarrhea, unspecified: Secondary | ICD-10-CM

## 2013-08-26 DIAGNOSIS — F4322 Adjustment disorder with anxiety: Secondary | ICD-10-CM

## 2013-08-26 DIAGNOSIS — A048 Other specified bacterial intestinal infections: Secondary | ICD-10-CM

## 2013-08-26 DIAGNOSIS — K810 Acute cholecystitis: Secondary | ICD-10-CM

## 2013-08-26 DIAGNOSIS — E785 Hyperlipidemia, unspecified: Secondary | ICD-10-CM

## 2013-08-26 DIAGNOSIS — R609 Edema, unspecified: Secondary | ICD-10-CM

## 2013-08-26 DIAGNOSIS — F418 Other specified anxiety disorders: Secondary | ICD-10-CM

## 2013-08-26 DIAGNOSIS — F341 Dysthymic disorder: Secondary | ICD-10-CM

## 2013-08-26 DIAGNOSIS — G43909 Migraine, unspecified, not intractable, without status migrainosus: Secondary | ICD-10-CM

## 2013-08-26 DIAGNOSIS — R109 Unspecified abdominal pain: Secondary | ICD-10-CM

## 2013-08-26 DIAGNOSIS — D649 Anemia, unspecified: Secondary | ICD-10-CM

## 2013-08-26 MED ORDER — FUROSEMIDE 20 MG PO TABS: 20.0000 mg | ORAL_TABLET | Freq: Every day | ORAL | Status: DC | PRN

## 2013-08-26 MED ORDER — OXYCODONE HCL 10 MG PO TABS: ORAL_TABLET | ORAL | Status: DC

## 2013-08-26 MED ORDER — HYOSCYAMINE SULFATE 0.125 MG SL SUBL: 0.1250 mg | SUBLINGUAL_TABLET | SUBLINGUAL | Status: DC | PRN

## 2013-08-26 MED ORDER — VENLAFAXINE HCL 75 MG PO TABS: 75.0000 mg | ORAL_TABLET | Freq: Three times a day (TID) | ORAL | Status: DC

## 2013-08-26 NOTE — Progress Notes (Signed)
Patient ID: Heather Hanson, female   DOB: Feb 22, 1961, 52 y.o.   MRN: [id number] LEIGHTON LUSTER [id number] 1961/01/05 08/26/2013      Progress Note-Follow Up  Subjective  Chief Complaint  Chief Complaint  Patient presents with  . Follow-up    6-wk [Polyarthralgia; GERD; H.pylori; Anemia]  . Medication Problem    Pt reports Effexor subtherapeutic; request increase in dosage    HPI  Patient is a 52 year old American female who is in today for followup. She's not doing well. Her depression and anxiety are worse. She is having toe with persistent heartburn and generalized myalgias and arthralgias. She is struggling with diarrhea, nausea and anorexia. No vomiting or bloody stool.  Past Medical History  Diagnosis Date  . Anemia     thalassemia  . Depression   . Hyperlipidemia   . Seizure disorder     presumed secondary to ativan withdrawal -Dr Sharene Skeans  . Bursitis of right shoulder   . PUD (peptic ulcer disease)     Dr Loreta Ave ( H. Pylori)  . History of colonoscopy 2000  . Chronic insomnia   . Migraines   . Obesity   . Sinusitis, acute 04/09/2013  . Allergic state 04/09/2013  . Headache(784.0) 05/22/2013  . GERD (gastroesophageal reflux disease) 06/17/2013  . H. pylori infection 07/19/2013    Past Surgical History  Procedure Laterality Date  . Tonsillectomy  1972  . Abdominal hysterectomy  2000    Left ovary and tube intact    Family History  Problem Relation Age of Onset  . Cancer      Breast < 73 yo, ,Prostate <50 yo  . Coronary artery disease      <60 yo  . Diabetes    . Diabetes Mother   . Cancer Father     prostate  . COPD Father     History   Social History  . Marital Status: Divorced    Spouse Name: N/A    Number of Children: 2  . Years of Education: N/A   Occupational History  . 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 Smoking status: Never Smoker   . Smokeless tobacco: Never Used  . Alcohol Use: Yes     Comment: wine  occasionally  . Drug Use: No  . Sexual Activity: Not on file   Other Topics Concern  . Not on file   Social History Narrative   Occupation: Water quality scientist   Divorced     2 sons 26, 68      Never Smoked     Alcohol use-no       Current Outpatient Prescriptions on File Prior to Visit  Medication Sig Dispense Refill  . cetirizine (ZYRTEC) 10 MG tablet Take 1 tablet (10 mg total) by mouth daily as needed for allergies or rhinitis.  30 tablet  5  . LORazepam (ATIVAN) 2 MG tablet Take 1 tablet (2 mg total) by mouth every 6 (six) hours as needed for anxiety.  120 tablet  1  . NALTREXONE HCL PO Take 4 mg by mouth at bedtime.      . orphenadrine (NORFLEX) 100 MG tablet Take 1 tablet (100 mg total) by mouth 2 (two) times daily as needed for muscle spasms or pain.  60 tablet  3  . promethazine (PHENERGAN) 12.5 MG tablet Take 1 tablet (12.5 mg total) by mouth every 6 (six) hours as needed.  30 tablet  3  . saccharomyces boulardii (  FLORASTOR) 250 MG capsule Take 1 capsule (250 mg total) by mouth daily.  30 capsule  3  . topiramate (TOPAMAX) 50 MG tablet Take 1 tablet (50 mg total) by mouth 3 (three) times daily.  90 tablet  2  . venlafaxine (EFFEXOR) 50 MG tablet Take 1 tablet (50 mg total) by mouth 3 (three) times daily.  90 tablet  2   No current facility-administered medications on file prior to visit.    Allergies  Allergen Reactions  . Augmentin [Amoxicillin-Pot Clavulanate]     Blisters in the back of throat  . Hydrocodone-Acetaminophen     REACTION: Rash  . Oxycodone-Acetaminophen     REACTION: Rash  . Propoxyphene-Acetaminophen     REACTION: rash    Review of Systems  Review of Systems  Constitutional: Positive for malaise/fatigue. Negative for fever.  HENT: Negative for congestion.   Eyes: Negative for discharge.  Respiratory: Negative for shortness of breath.   Cardiovascular: Negative for chest pain, palpitations and leg swelling.  Gastrointestinal: Positive for  heartburn, nausea, abdominal pain and diarrhea.  Genitourinary: Negative for dysuria.  Musculoskeletal: Positive for myalgias, back pain and joint pain. Negative for falls.  Skin: Negative for rash.  Neurological: Positive for headaches. Negative for loss of consciousness.  Endo/Heme/Allergies: Negative for polydipsia.  Psychiatric/Behavioral: Positive for depression. Negative for suicidal ideas. The patient is nervous/anxious. The patient does not have insomnia.     Objective  BP 132/86  Pulse 67  Temp(Src) 98 F (36.7 C) (Oral)  Resp 16  Ht 5\' 7"  (1.702 m)  Wt 213 lb 4 oz (96.73 kg)  BMI 33.39 kg/m2  SpO2 94%  Physical Exam  Physical Exam  Constitutional: She is oriented to person, place, and time and well-developed, well-nourished, and in no distress. No distress.  HENT:  Head: Normocephalic and atraumatic.  Eyes: Conjunctivae are normal.  Neck: Neck supple. No thyromegaly present.  Cardiovascular: Normal rate, regular rhythm and normal heart sounds.   No murmur heard. Pulmonary/Chest: Effort normal and breath sounds normal. She has no wheezes.  Abdominal: She exhibits no distension and no mass.  Musculoskeletal: She exhibits no edema.  Lymphadenopathy:    She has no cervical adenopathy.  Neurological: She is alert and oriented to person, place, and time.  Skin: Skin is warm and dry. No rash noted. She is not diaphoretic.  Psychiatric: Memory, affect and judgment normal.    Lab Results  Component Value Date   TSH 2.217 04/08/2013   Lab Results  Component Value Date   WBC 5.5 06/09/2013   HGB 12.5 06/09/2013   HCT 39.2 06/09/2013   MCV 72* 06/09/2013   PLT 199 06/09/2013   Lab Results  Component Value Date   CREATININE 0.7 06/09/2013   BUN 12 06/09/2013   NA 143 06/09/2013   K 3.7 06/09/2013   CL 108 06/09/2013   CO2 27 06/09/2013   Lab Results  Component Value Date   ALT 17 06/09/2013   AST 31 06/09/2013   ALKPHOS 80 06/09/2013   BILITOT 0.50 06/09/2013   Lab  Results  Component Value Date   CHOL 321* 07/15/2013   Lab Results  Component Value Date   HDL 68 07/15/2013   Lab Results  Component Value Date   LDLCALC 226* 07/15/2013   Lab Results  Component Value Date   TRIG 133 07/15/2013   Lab Results  Component Value Date   CHOLHDL 4.7 07/15/2013     Assessment & Plan  H. pylori  infection Recent scope with Dr Loreta Ave did not identify H Pylori.  ADJUSTMENT DISORDER WITH ANXIETY With worsening depression will increase Venlafaxine.   Cholecystitis, acute Has surgery scheduled for 09/16/13, encouraged to avoid fatty and spicy foods til surgery, present to ER if pain recurs and does not remit  HYPERLIPIDEMIA Very elevated, avoid trans fats, will need to restart statin when she feels better.   ANEMIA-NOS Improved.

## 2013-08-26 NOTE — Patient Instructions (Addendum)
Cholecystitis Cholecystitis is an inflammation of your gallbladder. It is usually caused by a buildup of gallstones or sludge (cholelithiasis) in your gallbladder. The gallbladder stores a fluid that helps digest fats (bile). Cholecystitis is serious and needs treatment right away.  CAUSES   Gallstones. Gallstones can block the tube that leads to your gallbladder, causing bile to build up. As bile builds up, the gallbladder becomes inflamed.  Bile duct problems, such as blockage from scarring or kinking.  Tumors. Tumors can stop bile from leaving your gallbladder correctly, causing bile to build up. As bile builds up, the gallbladder becomes inflamed. SYMPTOMS   Nausea.  Vomiting.  Abdominal pain, especially in the upper right area of your abdomen.  Abdominal tenderness or bloating.  Sweating.  Chills.  Fever.  Yellowing of the skin and the whites of the eyes (jaundice). DIAGNOSIS  Your caregiver may order blood tests to look for infection or gallbladder problems. Your caregiver may also order imaging tests, such as an ultrasound or computed tomography (CT) scan. Further tests may include a hepatobiliary iminodiacetic acid (HIDA) scan. This scan allows your caregiver to see your bile move from the liver to the gallbladder and to the small intestine. TREATMENT  A hospital stay is usually necessary to lessen the inflammation of your gallbladder. You may be required to not eat or drink (fast) for a certain amount of time. You may be given medicine to treat pain or an antibiotic medicine to treat an infection. Surgery may be needed to remove your gallbladder (cholecystectomy) once the inflammation has gone down. Surgery may be needed right away if you develop complications such as death of gallbladder tissue (gangrene) or a tear (perforation) of the gallbladder.  HOME CARE INSTRUCTIONS  Home care will depend on your treatment. In general:  If you were given antibiotics, take them as  directed. Finish them even if you start to feel better.  Only take over-the-counter or prescription medicines for pain, discomfort, or fever as directed by your caregiver.  Follow a low-fat diet until you see your caregiver again.  Keep all follow-up visits as directed by your caregiver. SEEK IMMEDIATE MEDICAL CARE IF:   Your pain is increasing and not controlled by medicines.  Your pain moves to another part of your abdomen or to your back.  You have a fever.  You have nausea and vomiting. MAKE SURE YOU:  Understand these instructions.  Will watch your condition.  Will get help right away if you are not doing well or get worse. Document Released: 11/27/2005 Document Revised: 02/19/2012 Document Reviewed: 10/13/2011 [employer] Patient Information 2014 [employer].  

## 2013-08-26 NOTE — Assessment & Plan Note (Addendum)
Recent scope with Dr Loreta Ave did not identify H Pylori.

## 2013-08-27 ENCOUNTER — Other Ambulatory Visit: Payer: Self-pay | Admitting: Family Medicine

## 2013-08-28 LAB — GIARDIA/CRYPTOSPORIDIUM (EIA)
Cryptosporidium Screen (EIA): NEGATIVE
Giardia Screen (EIA): NEGATIVE

## 2013-08-28 LAB — OVA AND PARASITE SCREEN: OP: NONE SEEN

## 2013-08-28 MED ORDER — FLUCONAZOLE 150 MG PO TABS: 150.0000 mg | ORAL_TABLET | ORAL | Status: DC

## 2013-08-29 LAB — CLOSTRIDIUM DIFFICILE BY PCR: Toxigenic C. Difficile by PCR: NOT DETECTED

## 2013-08-30 ENCOUNTER — Encounter: Payer: Self-pay | Admitting: Family Medicine

## 2013-08-30 DIAGNOSIS — K810 Acute cholecystitis: Secondary | ICD-10-CM

## 2013-08-30 HISTORY — DX: Acute cholecystitis: K81.0

## 2013-08-30 NOTE — Assessment & Plan Note (Signed)
Improved

## 2013-08-30 NOTE — Assessment & Plan Note (Signed)
Has surgery scheduled for 09/16/13, encouraged to avoid fatty and spicy foods til surgery, present to ER if pain recurs and does not remit

## 2013-08-30 NOTE — Assessment & Plan Note (Signed)
With worsening depression will increase Venlafaxine.

## 2013-08-30 NOTE — Assessment & Plan Note (Signed)
Very elevated, avoid trans fats, will need to restart statin when she feels better.

## 2013-09-04 ENCOUNTER — Encounter (HOSPITAL_COMMUNITY): Payer: Self-pay | Admitting: Pharmacy Technician

## 2013-09-05 ENCOUNTER — Encounter: Payer: Self-pay | Admitting: Family Medicine

## 2013-09-09 ENCOUNTER — Encounter (HOSPITAL_COMMUNITY)
Admission: RE | Admit: 2013-09-09 | Discharge: 2013-09-09 | Disposition: A | Discharge status: Home / Self Care | Payer: 59 | Source: Ambulatory Visit | Attending: General Surgery | Admitting: General Surgery

## 2013-09-09 ENCOUNTER — Other Ambulatory Visit: Payer: Self-pay | Admitting: Family Medicine

## 2013-09-09 ENCOUNTER — Encounter (HOSPITAL_COMMUNITY): Payer: Self-pay

## 2013-09-09 DIAGNOSIS — Z01812 Encounter for preprocedural laboratory examination: Secondary | ICD-10-CM | POA: Insufficient documentation

## 2013-09-09 DIAGNOSIS — Z01810 Encounter for preprocedural cardiovascular examination: Secondary | ICD-10-CM | POA: Insufficient documentation

## 2013-09-09 DIAGNOSIS — Z01818 Encounter for other preprocedural examination: Secondary | ICD-10-CM | POA: Insufficient documentation

## 2013-09-09 HISTORY — DX: Lichen simplex chronicus: L28.0

## 2013-09-09 HISTORY — DX: Other muscle spasm: M62.838

## 2013-09-09 HISTORY — DX: Shortness of breath: R06.02

## 2013-09-09 HISTORY — DX: Personal history of other diseases of the digestive system: Z87.19

## 2013-09-09 HISTORY — DX: Personal history of other diseases of the respiratory system: Z87.09

## 2013-09-09 HISTORY — DX: Unspecified osteoarthritis, unspecified site: M19.90

## 2013-09-09 HISTORY — DX: Adverse effect of unspecified anesthetic, initial encounter: T41.45XA

## 2013-09-09 HISTORY — DX: Anxiety disorder, unspecified: F41.9

## 2013-09-09 HISTORY — DX: Edema, unspecified: R60.9

## 2013-09-09 HISTORY — DX: Other complications of anesthesia, initial encounter: T88.59XA

## 2013-09-09 HISTORY — DX: Fibromyalgia: M79.7

## 2013-09-09 HISTORY — DX: Constipation, unspecified: K59.00

## 2013-09-09 HISTORY — DX: Nausea: R11.0

## 2013-09-09 HISTORY — DX: Localized edema: R60.0

## 2013-09-09 LAB — CBC WITH DIFFERENTIAL/PLATELET
Basophils Absolute: 0 10*3/uL (ref 0.0–0.1)
Basophils Relative: 0 % (ref 0–1)
Eosinophils Absolute: 0 10*3/uL (ref 0.0–0.7)
HCT: 38.2 % (ref 36.0–46.0)
Lymphs Abs: 1.5 10*3/uL (ref 0.7–4.0)
MCH: 23.1 pg — ABNORMAL LOW (ref 26.0–34.0)
MCHC: 32.7 g/dL (ref 30.0–36.0)
MCV: 70.5 fL — ABNORMAL LOW (ref 78.0–100.0)
Monocytes Absolute: 0.4 10*3/uL (ref 0.1–1.0)
Neutro Abs: 2.6 10*3/uL (ref 1.7–7.7)
Platelets: 178 10*3/uL (ref 150–400)
RDW: 14.7 % (ref 11.5–15.5)
WBC: 4.5 10*3/uL (ref 4.0–10.5)

## 2013-09-09 LAB — COMPREHENSIVE METABOLIC PANEL
ALT: 16 U/L (ref 0–35)
Albumin: 4.1 g/dL (ref 3.5–5.2)
Alkaline Phosphatase: 97 U/L (ref 39–117)
BUN: 13 mg/dL (ref 6–23)
CO2: 27 mEq/L (ref 19–32)
Creatinine, Ser: 1.18 mg/dL — ABNORMAL HIGH (ref 0.50–1.10)
GFR calc Af Amer: 59 mL/min — ABNORMAL LOW (ref >90)
Glucose, Bld: 79 mg/dL (ref 70–99)
Potassium: 3.3 mEq/L — ABNORMAL LOW (ref 3.5–5.1)
Total Protein: 7.7 g/dL (ref 6.0–8.3)

## 2013-09-09 NOTE — Pre-Procedure Instructions (Signed)
Heather Hanson  09/09/2013   Your procedure is scheduled on:  Tues, Oct 7 @ 7:30 AM  Report to [facility]  2 * 3 at 5:30 AM.  Call this number if you have problems the morning of surgery: [phone]   Remember:   Do not eat food or drink liquids after midnight.   Take these medicines the morning of surgery with A SIP OF WATER: Zyrtec(Cetirizine),Dexilant(Dexlansoprazole),Diflucan(Fluconazole-if time frame),Ativan(Lorazepam),Nasonex(Mometasone-if needed),Pain Pill(if needed),Phenergan(Promethazine-if needed),Topamax(Topiramate),and Venlafaxine(Effexor)                 No Goody's,BC's,Aleve,Aspirin,Ibuprofen,Fish Oil,or any Herbal Medications   Do not wear jewelry, make-up or nail polish.  Do not wear lotions, powders, or perfumes. You may wear deodorant.  Do not shave 48 hours prior to surgery.   Do not bring valuables to the hospital.  [facility] is not responsible                  for any belongings or valuables.               Contacts, dentures or bridgework may not be worn into surgery.  Leave suitcase in the car. After surgery it may be brought to your room.  For patients admitted to the hospital, discharge time is determined by your                treatment team.               Patients discharged the day of surgery will not be allowed to drive  home.    Special Instructions: Shower using CHG 2 nights before surgery and the night before surgery.  If you shower the day of surgery use CHG.  Use special wash - you have one bottle of CHG for all showers.  You should use approximately 1/3 of the bottle for each shower.   Please read over the following fact sheets that you were given: Pain Booklet, Coughing and Deep Breathing and Surgical Site Infection Prevention

## 2013-09-09 NOTE — Progress Notes (Addendum)
Pt doesn't have a cardiologist  Denies ever having an echo/stress test/heart cath  Medical Md is Dr.Stacy Abner Greenspan  [facility] to request  Denies EKG in past yr

## 2013-09-15 MED ORDER — CIPROFLOXACIN IN D5W 400 MG/200ML IV SOLN
400.0000 mg | INTRAVENOUS | Status: AC
  Administered 2013-09-16: 400 mg via INTRAVENOUS
  Filled 2013-09-15: qty 200

## 2013-09-16 ENCOUNTER — Ambulatory Visit (HOSPITAL_COMMUNITY)
Admission: RE | Admit: 2013-09-16 | Discharge: 2013-09-16 | Disposition: A | Discharge status: Home / Self Care | Payer: 59 | Source: Ambulatory Visit | Attending: General Surgery | Admitting: General Surgery

## 2013-09-16 ENCOUNTER — Encounter (HOSPITAL_COMMUNITY): Payer: Self-pay | Admitting: Surgery

## 2013-09-16 ENCOUNTER — Encounter (HOSPITAL_COMMUNITY): Payer: Self-pay | Admitting: Anesthesiology

## 2013-09-16 ENCOUNTER — Encounter (HOSPITAL_COMMUNITY)
Admission: RE | Disposition: A | Discharge status: Home / Self Care | Payer: Self-pay | Source: Ambulatory Visit | Attending: General Surgery

## 2013-09-16 ENCOUNTER — Ambulatory Visit (HOSPITAL_COMMUNITY): Payer: 59 | Admitting: Anesthesiology

## 2013-09-16 DIAGNOSIS — F329 Major depressive disorder, single episode, unspecified: Secondary | ICD-10-CM | POA: Insufficient documentation

## 2013-09-16 DIAGNOSIS — IMO0001 Reserved for inherently not codable concepts without codable children: Secondary | ICD-10-CM | POA: Insufficient documentation

## 2013-09-16 DIAGNOSIS — G43909 Migraine, unspecified, not intractable, without status migrainosus: Secondary | ICD-10-CM | POA: Insufficient documentation

## 2013-09-16 DIAGNOSIS — E785 Hyperlipidemia, unspecified: Secondary | ICD-10-CM | POA: Insufficient documentation

## 2013-09-16 DIAGNOSIS — K8010 Calculus of gallbladder with chronic cholecystitis without obstruction: Secondary | ICD-10-CM

## 2013-09-16 DIAGNOSIS — R110 Nausea: Secondary | ICD-10-CM | POA: Insufficient documentation

## 2013-09-16 DIAGNOSIS — K449 Diaphragmatic hernia without obstruction or gangrene: Secondary | ICD-10-CM | POA: Insufficient documentation

## 2013-09-16 DIAGNOSIS — F3289 Other specified depressive episodes: Secondary | ICD-10-CM | POA: Insufficient documentation

## 2013-09-16 DIAGNOSIS — Z8711 Personal history of peptic ulcer disease: Secondary | ICD-10-CM | POA: Insufficient documentation

## 2013-09-16 DIAGNOSIS — F411 Generalized anxiety disorder: Secondary | ICD-10-CM | POA: Insufficient documentation

## 2013-09-16 DIAGNOSIS — K219 Gastro-esophageal reflux disease without esophagitis: Secondary | ICD-10-CM | POA: Insufficient documentation

## 2013-09-16 HISTORY — PX: CHOLECYSTECTOMY: SHX55

## 2013-09-16 SURGERY — LAPAROSCOPIC CHOLECYSTECTOMY
Anesthesia: General | Site: Abdomen | Wound class: Contaminated

## 2013-09-16 MED ORDER — OXYCODONE HCL 5 MG PO TABS
ORAL_TABLET | ORAL | Status: AC
  Filled 2013-09-16: qty 2

## 2013-09-16 MED ORDER — SODIUM CHLORIDE 0.9 % IJ SOLN: 3.0000 mL | INTRAMUSCULAR | Status: DC | PRN

## 2013-09-16 MED ORDER — MORPHINE SULFATE 2 MG/ML IJ SOLN
INTRAMUSCULAR | Status: AC
  Filled 2013-09-16: qty 1

## 2013-09-16 MED ORDER — BUPIVACAINE-EPINEPHRINE PF 0.25-1:200000 % IJ SOLN
INTRAMUSCULAR | Status: AC
  Filled 2013-09-16: qty 30

## 2013-09-16 MED ORDER — ONDANSETRON HCL 4 MG/2ML IJ SOLN
4.0000 mg | Freq: Four times a day (QID) | INTRAMUSCULAR | Status: DC | PRN
  Filled 2013-09-16 (×2): qty 2

## 2013-09-16 MED ORDER — MIDAZOLAM HCL 5 MG/5ML IJ SOLN
INTRAMUSCULAR | Status: DC | PRN
  Administered 2013-09-16: 2 mg via INTRAVENOUS

## 2013-09-16 MED ORDER — HYDROMORPHONE HCL PF 1 MG/ML IJ SOLN
0.2500 mg | INTRAMUSCULAR | Status: DC | PRN
  Administered 2013-09-16 (×2): 0.5 mg via INTRAVENOUS

## 2013-09-16 MED ORDER — MORPHINE SULFATE 2 MG/ML IJ SOLN
2.0000 mg | INTRAMUSCULAR | Status: DC | PRN
  Administered 2013-09-16: 2 mg via INTRAVENOUS

## 2013-09-16 MED ORDER — SODIUM CHLORIDE 0.9 % IR SOLN
Status: DC | PRN
  Administered 2013-09-16 (×2): 1000 mL

## 2013-09-16 MED ORDER — HYDROMORPHONE HCL PF 1 MG/ML IJ SOLN
INTRAMUSCULAR | Status: AC
  Filled 2013-09-16: qty 1

## 2013-09-16 MED ORDER — ONDANSETRON HCL 4 MG/2ML IJ SOLN
4.0000 mg | Freq: Four times a day (QID) | INTRAMUSCULAR | Status: DC | PRN
  Administered 2013-09-16: 4 mg via INTRAVENOUS

## 2013-09-16 MED ORDER — PROMETHAZINE HCL 25 MG/ML IJ SOLN: 12.5000 mg | Freq: Four times a day (QID) | INTRAMUSCULAR | Status: DC | PRN

## 2013-09-16 MED ORDER — OXYCODONE HCL 5 MG PO TABS
10.0000 mg | ORAL_TABLET | ORAL | Status: DC | PRN
  Administered 2013-09-16: 10 mg via ORAL

## 2013-09-16 MED ORDER — LACTATED RINGERS IV SOLN
INTRAVENOUS | Status: DC | PRN
  Administered 2013-09-16 (×2): via INTRAVENOUS

## 2013-09-16 MED ORDER — BUPIVACAINE-EPINEPHRINE 0.25% -1:200000 IJ SOLN
INTRAMUSCULAR | Status: DC | PRN
  Administered 2013-09-16: 14 mL

## 2013-09-16 MED ORDER — ONDANSETRON HCL 4 MG/2ML IJ SOLN
INTRAMUSCULAR | Status: DC | PRN
  Administered 2013-09-16: 4 mg via INTRAMUSCULAR

## 2013-09-16 MED ORDER — SODIUM CHLORIDE 0.9 % IV SOLN: 250.0000 mL | INTRAVENOUS | Status: DC | PRN

## 2013-09-16 MED ORDER — SODIUM CHLORIDE 0.9 % IJ SOLN: 3.0000 mL | Freq: Two times a day (BID) | INTRAMUSCULAR | Status: DC

## 2013-09-16 MED ORDER — FENTANYL CITRATE 0.05 MG/ML IJ SOLN
INTRAMUSCULAR | Status: DC | PRN
  Administered 2013-09-16 (×5): 50 ug via INTRAVENOUS
  Administered 2013-09-16: 100 ug via INTRAVENOUS
  Administered 2013-09-16: 50 ug via INTRAVENOUS

## 2013-09-16 MED ORDER — NEOSTIGMINE METHYLSULFATE 1 MG/ML IJ SOLN
INTRAMUSCULAR | Status: DC | PRN
  Administered 2013-09-16: 4 mg via INTRAVENOUS

## 2013-09-16 MED ORDER — SODIUM CHLORIDE 0.9 % IV SOLN: INTRAVENOUS | Status: DC

## 2013-09-16 MED ORDER — LIDOCAINE HCL (CARDIAC) 20 MG/ML IV SOLN
INTRAVENOUS | Status: DC | PRN
  Administered 2013-09-16: 100 mg via INTRAVENOUS

## 2013-09-16 MED ORDER — ROCURONIUM BROMIDE 100 MG/10ML IV SOLN
INTRAVENOUS | Status: DC | PRN
  Administered 2013-09-16: 5 mg via INTRAVENOUS
  Administered 2013-09-16: 30 mg via INTRAVENOUS

## 2013-09-16 MED ORDER — OXYCODONE HCL 5 MG PO TABS: 5.0000 mg | ORAL_TABLET | Freq: Four times a day (QID) | ORAL | Status: DC | PRN

## 2013-09-16 MED ORDER — PHENYLEPHRINE HCL 10 MG/ML IJ SOLN
INTRAMUSCULAR | Status: DC | PRN
  Administered 2013-09-16: 40 ug via INTRAVENOUS

## 2013-09-16 MED ORDER — DEXAMETHASONE SODIUM PHOSPHATE 4 MG/ML IJ SOLN
INTRAMUSCULAR | Status: DC | PRN
  Administered 2013-09-16: 4 mg via INTRAVENOUS

## 2013-09-16 MED ORDER — 0.9 % SODIUM CHLORIDE (POUR BTL) OPTIME
TOPICAL | Status: DC | PRN
  Administered 2013-09-16: 1000 mL

## 2013-09-16 MED ORDER — PROPOFOL 10 MG/ML IV BOLUS
INTRAVENOUS | Status: DC | PRN
  Administered 2013-09-16: 40 mg via INTRAVENOUS
  Administered 2013-09-16: 160 mg via INTRAVENOUS

## 2013-09-16 MED ORDER — ARTIFICIAL TEARS OP OINT
TOPICAL_OINTMENT | OPHTHALMIC | Status: DC | PRN
  Administered 2013-09-16: 1 via OPHTHALMIC

## 2013-09-16 MED ORDER — GLYCOPYRROLATE 0.2 MG/ML IJ SOLN
INTRAMUSCULAR | Status: DC | PRN
  Administered 2013-09-16: 0.6 mg via INTRAVENOUS

## 2013-09-16 MED ORDER — PROMETHAZINE HCL 25 MG/ML IJ SOLN: 6.2500 mg | INTRAMUSCULAR | Status: DC | PRN

## 2013-09-16 SURGICAL SUPPLY — 44 items
ADH SKN CLS APL DERMABOND .7 (GAUZE/BANDAGES/DRESSINGS) ×1
APPLIER CLIP 5 13 M/L LIGAMAX5 (MISCELLANEOUS) ×2
APR CLP MED LRG 5 ANG JAW (MISCELLANEOUS) ×1
BAG SPEC RTRVL LRG 6X4 10 (ENDOMECHANICALS) ×1
BLADE SURG ROTATE 9660 (MISCELLANEOUS) IMPLANT
CANISTER SUCTION 2500CC (MISCELLANEOUS) ×2 IMPLANT
CHLORAPREP W/TINT 26ML (MISCELLANEOUS) ×2 IMPLANT
CLIP APPLIE 5 13 M/L LIGAMAX5 (MISCELLANEOUS) ×1 IMPLANT
CLOTH BEACON ORANGE TIMEOUT ST (SAFETY) ×2 IMPLANT
COVER MAYO STAND STRL (DRAPES) IMPLANT
COVER SURGICAL LIGHT HANDLE (MISCELLANEOUS) ×2 IMPLANT
DECANTER SPIKE VIAL GLASS SM (MISCELLANEOUS) ×1 IMPLANT
DERMABOND ADVANCED (GAUZE/BANDAGES/DRESSINGS) ×1
DERMABOND ADVANCED .7 DNX12 (GAUZE/BANDAGES/DRESSINGS) ×1 IMPLANT
DEVICE TROCAR PUNCTURE CLOSURE (ENDOMECHANICALS) ×1 IMPLANT
DRAPE C-ARM 42X72 X-RAY (DRAPES) IMPLANT
ELECT REM PT RETURN 9FT ADLT (ELECTROSURGICAL) ×2
ELECTRODE REM PT RTRN 9FT ADLT (ELECTROSURGICAL) ×1 IMPLANT
GLOVE BIO SURGEON STRL SZ7 (GLOVE) ×1 IMPLANT
GLOVE BIOGEL PI IND STRL 7.0 (GLOVE) IMPLANT
GLOVE BIOGEL PI IND STRL 7.5 (GLOVE) ×1 IMPLANT
GLOVE BIOGEL PI INDICATOR 7.0 (GLOVE) ×2
GLOVE BIOGEL PI INDICATOR 7.5 (GLOVE) ×1
GLOVE SURG SS PI 6.5 STRL IVOR (GLOVE) ×2 IMPLANT
GLOVE SURG SS PI 7.0 STRL IVOR (GLOVE) ×3 IMPLANT
GOWN STRL NON-REIN LRG LVL3 (GOWN DISPOSABLE) ×7 IMPLANT
KIT BASIN OR (CUSTOM PROCEDURE TRAY) ×2 IMPLANT
KIT ROOM TURNOVER OR (KITS) ×2 IMPLANT
NS IRRIG 1000ML POUR BTL (IV SOLUTION) ×2 IMPLANT
PAD ARMBOARD 7.5X6 YLW CONV (MISCELLANEOUS) ×2 IMPLANT
POUCH SPECIMEN RETRIEVAL 10MM (ENDOMECHANICALS) ×2 IMPLANT
SCISSORS LAP 5X35 DISP (ENDOMECHANICALS) IMPLANT
SET CHOLANGIOGRAPH 5 50 .035 (SET/KITS/TRAYS/PACK) IMPLANT
SET IRRIG TUBING LAPAROSCOPIC (IRRIGATION / IRRIGATOR) ×2 IMPLANT
SLEEVE ENDOPATH XCEL 5M (ENDOMECHANICALS) ×4 IMPLANT
SPECIMEN JAR SMALL (MISCELLANEOUS) ×2 IMPLANT
SUT MNCRL AB 4-0 PS2 18 (SUTURE) ×2 IMPLANT
SUT VICRYL 0 UR6 27IN ABS (SUTURE) ×3 IMPLANT
TOWEL OR 17X24 6PK STRL BLUE (TOWEL DISPOSABLE) ×2 IMPLANT
TOWEL OR 17X26 10 PK STRL BLUE (TOWEL DISPOSABLE) ×2 IMPLANT
TRAY LAPAROSCOPIC (CUSTOM PROCEDURE TRAY) ×2 IMPLANT
TROCAR XCEL 12X100 BLDLESS (ENDOMECHANICALS) ×1 IMPLANT
TROCAR XCEL BLUNT TIP 100MML (ENDOMECHANICALS) ×2 IMPLANT
TROCAR XCEL NON-BLD 5MMX100MML (ENDOMECHANICALS) ×2 IMPLANT

## 2013-09-16 NOTE — Transfer of Care (Signed)
Immediate Anesthesia Transfer of Care Note  Patient: Heather Hanson  Procedure(s) Performed: Procedure(s): LAPAROSCOPIC CHOLECYSTECTOMY (N/A)  Patient Location: PACU  Anesthesia Type:General  Level of Consciousness: sedated  Airway & Oxygen Therapy: Patient Spontanous Breathing and Patient connected to nasal cannula oxygen  Post-op Assessment: Report given to PACU RN and Post -op Vital signs reviewed and stable  Post vital signs: Reviewed and stable  Complications: No apparent anesthesia complications

## 2013-09-16 NOTE — Progress Notes (Signed)
DR WAKEFIELD NOTIFIED OF C/O PAIN AND NAUSEA AND ORDERS NOTED

## 2013-09-16 NOTE — Progress Notes (Signed)
STATES NO NAUSEA AND PAIN DECREASED TO 8/10 AND STATES WANTS TO GO HOME

## 2013-09-16 NOTE — Progress Notes (Signed)
HAS BEEN UP TO BATHROOM AND VOIDED

## 2013-09-16 NOTE — Interval H&P Note (Signed)
History and Physical Interval Note:  09/16/2013 7:20 AM  Heather Hanson  has presented today for surgery, with the diagnosis of cholestitis  The various methods of treatment have been discussed with the patient and family. After consideration of risks, benefits and other options for treatment, the patient has consented to  Procedure(s): LAPAROSCOPIC CHOLECYSTECTOMY (N/A) as a surgical intervention .  The patient's history has been reviewed, patient examined, no change in status, stable for surgery.  I have reviewed the patient's chart and labs.  Questions were answered to the patient's satisfaction.     Quashon Jesus

## 2013-09-16 NOTE — Anesthesia Procedure Notes (Signed)
Procedure Name: Intubation Date/Time: 09/16/2013 7:35 AM Performed by: Orvilla Fus A Pre-anesthesia Checklist: Patient identified, Timeout performed, Emergency Drugs available, Suction available and Patient being monitored Patient Re-evaluated:Patient Re-evaluated prior to inductionOxygen Delivery Method: Circle system utilized Preoxygenation: Pre-oxygenation with 100% oxygen Intubation Type: IV induction Ventilation: Mask ventilation without difficulty Laryngoscope Size: Mac and 3 Grade View: Grade II Tube type: Oral Tube size: 7.0 mm Number of attempts: 1 Airway Equipment and Method: Stylet Placement Confirmation: ETT inserted through vocal cords under direct vision,  breath sounds checked- equal and bilateral and positive ETCO2 Secured at: 21 cm Tube secured with: Tape Dental Injury: Teeth and Oropharynx as per pre-operative assessment

## 2013-09-16 NOTE — Anesthesia Postprocedure Evaluation (Signed)
Anesthesia Post Note  Patient: Heather Hanson  Procedure(s) Performed: Procedure(s) (LRB): LAPAROSCOPIC CHOLECYSTECTOMY (N/A)  Anesthesia type: general  Patient location: PACU  Post pain: Pain level controlled  Post assessment: Patient's Cardiovascular Status Stable   Post vital signs: Reviewed and stable  Level of consciousness: sedated  Complications: No apparent anesthesia complications

## 2013-09-16 NOTE — Anesthesia Preprocedure Evaluation (Addendum)
Anesthesia Evaluation  Patient identified by MRN, date of birth, ID band Patient awake    Reviewed: Allergy & Precautions, H&P , NPO status , Patient's Chart, lab work & pertinent test results  History of Anesthesia Complications Negative for: history of anesthetic complications  Airway Mallampati: II TM Distance: >3 FB Neck ROM: Full    Dental  (+) Teeth Intact and Dental Advisory Given   Pulmonary neg pulmonary ROS,    Pulmonary exam normal       Cardiovascular negative cardio ROS      Neuro/Psych  Headaches, Seizures -, Well Controlled,  PSYCHIATRIC DISORDERS Anxiety Depression    GI/Hepatic Neg liver ROS, hiatal hernia, PUD, GERD-  ,  Endo/Other  negative endocrine ROS  Renal/GU negative Renal ROS     Musculoskeletal  (+) Fibromyalgia -, narcotic dependent  Abdominal   Peds  Hematology  (+) Blood dyscrasia, anemia , Thalasemia   Anesthesia Other Findings   Reproductive/Obstetrics                          Anesthesia Physical Anesthesia Plan  ASA: II  Anesthesia Plan: General   Post-op Pain Management:    Induction: Intravenous  Airway Management Planned: Oral ETT  Additional Equipment:   Intra-op Plan:   Post-operative Plan: Extubation in OR  Informed Consent: I have reviewed the patients History and Physical, chart, labs and discussed the procedure including the risks, benefits and alternatives for the proposed anesthesia with the patient or authorized representative who has indicated his/her understanding and acceptance.   Dental advisory given  Plan Discussed with: CRNA, Anesthesiologist and Surgeon  Anesthesia Plan Comments:        Anesthesia Quick Evaluation

## 2013-09-16 NOTE — Preoperative (Signed)
Beta Blockers   Reason not to administer Beta Blockers:Not Applicable 

## 2013-09-16 NOTE — H&P (View-Only) (Signed)
Patient ID: Heather Hanson, female   DOB: [birth year], 52 y.o.   MRN: [id number]  Chief Complaint  Patient presents with  . New Evaluation    eval cholelithiais    HPI Heather Hanson is a 52 y.o. female.  Referred by Dr Loreta Ave HPI 66 yof who preents with loose stools, ruq abdominal pain made worse with eating.  She has no fevers, vomiting.  She has bloating and pain is getting worse.  This led her to undergo ct scan on 8/6 that shows faint gallstones with possible duodenal defect.  Since then she underwent egd and colonoscopy that are normal.She does have history of gerd but this is very different to her. An abdominal ultrasound shows small echogenic foci in the gallbladder fundus.  There is no gbw thickening.  No pericholecystic fluid and negative sonographic Murphys sign.  CBD is normal.  She comes in today and the first thing she says is im here for you to take my gallbladder out.  She works as a Water quality scientist.  Past Medical History  Diagnosis Date  . Anemia     thalassemia  . Depression   . Hyperlipidemia   . Seizure disorder     presumed secondary to ativan withdrawal -Dr Sharene Skeans  . Bursitis of right shoulder   . PUD (peptic ulcer disease)     Dr Loreta Ave ( H. Pylori)  . History of colonoscopy 2000  . Chronic insomnia   . Migraines   . Obesity   . Sinusitis, acute 04/09/2013  . Allergic state 04/09/2013  . Headache(784.0) 05/22/2013  . GERD (gastroesophageal reflux disease) 06/17/2013  . H. pylori infection 07/19/2013    Past Surgical History  Procedure Laterality Date  . Tonsillectomy  1972  . Abdominal hysterectomy  2000    Left ovary and tube intact    Family History  Problem Relation Age of Onset  . Cancer      Breast < 58 yo, ,Prostate <50 yo  . Coronary artery disease      <60 yo  . Diabetes    . Diabetes Mother   . Cancer Father     prostate  . COPD Father     Social History History  Substance Use Topics  . Smoking status: Never Smoker   . Smokeless  tobacco: Never Used  . Alcohol Use: Yes     Comment: wine occasionally    Allergies  Allergen Reactions  . Augmentin [Amoxicillin-Pot Clavulanate]     Blisters in the back of throat  . Hydrocodone-Acetaminophen     REACTION: Rash  . Oxycodone-Acetaminophen     REACTION: Rash  . Propoxyphene-Acetaminophen     REACTION: rash    Current Outpatient Prescriptions  Medication Sig Dispense Refill  . cetirizine (ZYRTEC) 10 MG tablet Take 1 tablet (10 mg total) by mouth daily as needed for allergies or rhinitis.  30 tablet  5  . dexlansoprazole (DEXILANT) 60 MG capsule Take 1 capsule (60 mg total) by mouth daily.  30 capsule  5  . furosemide (LASIX) 20 MG tablet Take 1 tablet (20 mg total) by mouth daily as needed.  30 tablet  5  . LORazepam (ATIVAN) 2 MG tablet Take 1 tablet (2 mg total) by mouth every 6 (six) hours as needed for anxiety.  120 tablet  1  . mometasone (NASONEX) 50 MCG/ACT nasal spray Place 2 sprays into the nose daily.  17 g  5  . NALTREXONE HCL PO Take 4 mg by mouth at  bedtime.      . orphenadrine (NORFLEX) 100 MG tablet Take 1 tablet (100 mg total) by mouth 2 (two) times daily as needed for muscle spasms or pain.  60 tablet  3  . Oxycodone HCl 10 MG TABS 1-2 tabs po qhs prn pain  60 tablet  0  . promethazine (PHENERGAN) 12.5 MG tablet Take 1 tablet (12.5 mg total) by mouth every 6 (six) hours as needed.  30 tablet  3  . saccharomyces boulardii (FLORASTOR) 250 MG capsule Take 1 capsule (250 mg total) by mouth daily.  30 capsule  3  . topiramate (TOPAMAX) 50 MG tablet Take 1 tablet (50 mg total) by mouth 3 (three) times daily.  90 tablet  2  . venlafaxine (EFFEXOR) 50 MG tablet Take 1 tablet (50 mg total) by mouth 3 (three) times daily.  90 tablet  2  . Linaclotide (LINZESS) 145 MCG CAPS Take 1 capsule (145 mcg total) by mouth daily.  30 capsule  3   No current facility-administered medications for this visit.    Review of Systems Review of Systems  Constitutional:  Negative for fever, chills and unexpected weight change.  HENT: Negative for hearing loss, congestion, sore throat, trouble swallowing and voice change.   Eyes: Negative for visual disturbance.  Respiratory: Negative for cough and wheezing.   Cardiovascular: Negative for chest pain, palpitations and leg swelling.  Gastrointestinal: Positive for abdominal pain and diarrhea. Negative for nausea, vomiting, constipation, blood in stool, abdominal distention and anal bleeding.  Genitourinary: Negative for hematuria, vaginal bleeding and difficulty urinating.  Musculoskeletal: Negative for arthralgias.  Skin: Negative for rash and wound.  Neurological: Negative for seizures, syncope and headaches.  Hematological: Negative for adenopathy. Does not bruise/bleed easily.  Psychiatric/Behavioral: Negative for confusion.    Blood pressure 130/90, pulse 72, temperature 97.7 F (36.5 C), temperature source Temporal, resp. rate 14, height 5\' 8"  (1.727 m), weight 214 lb (97.07 kg).  Physical Exam Physical Exam  Vitals reviewed. Constitutional: She appears well-developed and well-nourished.  Eyes: No scleral icterus.  Cardiovascular: Normal rate, regular rhythm and normal heart sounds.   Pulmonary/Chest: Effort normal and breath sounds normal. She has no wheezes. She has no rales.  Abdominal: Soft. Bowel sounds are normal. There is tenderness (mild ruq).    Lymphadenopathy:    She has no cervical adenopathy.    Data Reviewed CT, [place], Dr. Trilby Drummer notes, egd and colonoscopy reports  Assessment    Symptomatic cholelithiasis    Plan    Lap cholecystectomy I do think some of her symptoms are coming from her gallbladder.  I don't think I will help loose stools and actually might make that worse. I discussed the procedure in detail.   We discussed the risks and benefits of a laparoscopic cholecystectomy and possible cholangiogram including, but not limited to bleeding, infection, injury to  surrounding structures such as the intestine or liver, bile leak, retained gallstones, need to convert to an open procedure, prolonged diarrhea, blood clots such as  DVT, common bile duct injury, anesthesia risks, and possible need for additional procedures.  The likelihood of improvement in symptoms and return to the patient's normal status is good. We discussed the typical post-operative recovery course. I discussed she may have additional ports to lyse some midline adhesions also.       Delanda Bulluck 08/19/2013, 10:50 PM

## 2013-09-16 NOTE — Op Note (Signed)
Preoperative diagnosis: Biliary colic Postoperative diagnosis: Same as above Procedure: Laparoscopic cholecystectomy Surgeon: Dr. Dwain Sarna Anesthesia: Gen. Estimated blood loss: Minimal Specimens: Gallbladder and contents to pathology Complications: None Drains: None Sponge and needle count was correct at completion Disposition to recovery in stable condition  Indications:This is a 52 year old female who has a question of cholelithiasis on ultrasound as well as a CT scan of his symptoms appear to be consistent with biliary colic. We discussed a laparoscopic cholecystectomy due to her symptoms.  Procedure: After informed consent was obtained the patient was taken to the operating room. She was given ciprofloxacin due to her allergies. She had sequential compression devices on her legs. She was in placed under general anesthesia without complication. Her abdomen was prepped and draped in the standard sterile surgical fashion. A surgical timeout was then performed.  Due to her prior low midline incision I elected to insert a 5 mm Optiview trocar in the left upper quadrant. I did this without difficulty. There is no evidence of an entry injury. She was noted to have some omental adhesions to her abdominal wall. I then inserted 2 further 5 mm trocars in the right side of the abdomen. I lysed these lesions and freed up the umbilicus. I then made an incision above the umbilicus after infiltrating Marcaine. I then inserted a 10 mm trocar. I then moved my Optiview trocar to the epigastrium. I then retracted the gallbladder cephalad and lateral. The critical view of safety was obtained. I then clipped the duct 3 times and divided it. The duct was not necrotic. The clips on all the way across the duct. I then treated the anterior and posterior branches of the artery in a similar fashion. I then removed the gallbladder from the liver bed without difficulty. There was some spillage of bile during this portion.  The gallbladder was then placed in Endo Catch bag and removed from the umbilicus. I then irrigated copiously until this was clear. Hemostasis was obtained. I removed the 10 mm trocar. I used 0 Vicryl suture and Endo Close device to completely close this trocar site. I then desufflated the abdomen and remove our remaining trocars. I closed these with 4 Monocryl Dermabond and Steri-Strips. She tolerated as well as extubated and transferred to recovery stable.

## 2013-09-19 NOTE — OR Nursing (Signed)
Late entry to time out of room.

## 2013-09-22 ENCOUNTER — Encounter (HOSPITAL_COMMUNITY): Payer: Self-pay | Admitting: General Surgery

## 2013-09-29 ENCOUNTER — Telehealth (INDEPENDENT_AMBULATORY_CARE_PROVIDER_SITE_OTHER): Payer: Self-pay

## 2013-09-29 ENCOUNTER — Other Ambulatory Visit: Payer: Self-pay | Admitting: Family Medicine

## 2013-09-29 ENCOUNTER — Other Ambulatory Visit (INDEPENDENT_AMBULATORY_CARE_PROVIDER_SITE_OTHER): Payer: Self-pay | Admitting: General Surgery

## 2013-09-29 ENCOUNTER — Other Ambulatory Visit (INDEPENDENT_AMBULATORY_CARE_PROVIDER_SITE_OTHER): Payer: Self-pay

## 2013-09-29 DIAGNOSIS — G8918 Other acute postprocedural pain: Secondary | ICD-10-CM

## 2013-09-29 LAB — LIPASE: Lipase: 28 U/L (ref 0–75)

## 2013-09-29 LAB — COMPREHENSIVE METABOLIC PANEL
AST: 21 U/L (ref 0–37)
Alkaline Phosphatase: 103 U/L (ref 39–117)
BUN: 10 mg/dL (ref 6–23)
CO2: 24 mEq/L (ref 19–32)
Calcium: 9.2 mg/dL (ref 8.4–10.5)
Chloride: 106 mEq/L (ref 96–112)
Creat: 0.81 mg/dL (ref 0.50–1.10)

## 2013-09-29 NOTE — Telephone Encounter (Signed)
The pt calls to report her pain is bothering her s/p lap chole on 10/7.  It comes and goes but won't entirely go away.  The oxycodone won't help it.  She has pain in her back, and shoulder blades like preop.  She also has pain at one incision.  She has a headache.  She has no fever and she is not vomiting.   She wants to come in sooner than 10/28.  She would like to come in tomorrow.  I told her I will send a message to Dr Dwain Sarna to make sure he doesn't want to order tests prior to her office visit.

## 2013-09-29 NOTE — Telephone Encounter (Signed)
She needs lipase/cmet and urgent office appt.  If pain is increased so much that not controlled, n/v, fever then will need to go to er.

## 2013-09-29 NOTE — Telephone Encounter (Signed)
Appointment arranged by Cyndra Numbers for urgent clinic tomorrow and labs to be drawn.

## 2013-09-29 NOTE — Telephone Encounter (Signed)
Pt will have labs drawn at [facility].

## 2013-09-30 ENCOUNTER — Encounter (HOSPITAL_COMMUNITY): Payer: Self-pay | Admitting: Emergency Medicine

## 2013-09-30 ENCOUNTER — Ambulatory Visit (INDEPENDENT_AMBULATORY_CARE_PROVIDER_SITE_OTHER): Payer: Commercial Managed Care - PPO | Admitting: General Surgery

## 2013-09-30 ENCOUNTER — Encounter (INDEPENDENT_AMBULATORY_CARE_PROVIDER_SITE_OTHER): Payer: Self-pay | Admitting: General Surgery

## 2013-09-30 VITALS — BP 160/90 | HR 72 | Resp 14 | Ht 68.0 in | Wt 207.8 lb

## 2013-09-30 DIAGNOSIS — Z872 Personal history of diseases of the skin and subcutaneous tissue: Secondary | ICD-10-CM | POA: Insufficient documentation

## 2013-09-30 DIAGNOSIS — F411 Generalized anxiety disorder: Secondary | ICD-10-CM | POA: Insufficient documentation

## 2013-09-30 DIAGNOSIS — Z8709 Personal history of other diseases of the respiratory system: Secondary | ICD-10-CM | POA: Insufficient documentation

## 2013-09-30 DIAGNOSIS — R112 Nausea with vomiting, unspecified: Secondary | ICD-10-CM | POA: Insufficient documentation

## 2013-09-30 DIAGNOSIS — R109 Unspecified abdominal pain: Secondary | ICD-10-CM | POA: Insufficient documentation

## 2013-09-30 DIAGNOSIS — R141 Gas pain: Secondary | ICD-10-CM | POA: Insufficient documentation

## 2013-09-30 DIAGNOSIS — Z09 Encounter for follow-up examination after completed treatment for conditions other than malignant neoplasm: Secondary | ICD-10-CM

## 2013-09-30 DIAGNOSIS — Z79899 Other long term (current) drug therapy: Secondary | ICD-10-CM | POA: Insufficient documentation

## 2013-09-30 DIAGNOSIS — Z8739 Personal history of other diseases of the musculoskeletal system and connective tissue: Secondary | ICD-10-CM | POA: Insufficient documentation

## 2013-09-30 DIAGNOSIS — K5900 Constipation, unspecified: Secondary | ICD-10-CM | POA: Insufficient documentation

## 2013-09-30 DIAGNOSIS — Z8619 Personal history of other infectious and parasitic diseases: Secondary | ICD-10-CM | POA: Insufficient documentation

## 2013-09-30 DIAGNOSIS — F329 Major depressive disorder, single episode, unspecified: Secondary | ICD-10-CM | POA: Insufficient documentation

## 2013-09-30 DIAGNOSIS — G4700 Insomnia, unspecified: Secondary | ICD-10-CM | POA: Insufficient documentation

## 2013-09-30 DIAGNOSIS — Z91040 Latex allergy status: Secondary | ICD-10-CM | POA: Insufficient documentation

## 2013-09-30 DIAGNOSIS — G43909 Migraine, unspecified, not intractable, without status migrainosus: Secondary | ICD-10-CM | POA: Insufficient documentation

## 2013-09-30 DIAGNOSIS — R142 Eructation: Secondary | ICD-10-CM | POA: Insufficient documentation

## 2013-09-30 DIAGNOSIS — Z8669 Personal history of other diseases of the nervous system and sense organs: Secondary | ICD-10-CM | POA: Insufficient documentation

## 2013-09-30 DIAGNOSIS — Y838 Other surgical procedures as the cause of abnormal reaction of the patient, or of later complication, without mention of misadventure at the time of the procedure: Secondary | ICD-10-CM | POA: Insufficient documentation

## 2013-09-30 DIAGNOSIS — Z9889 Other specified postprocedural states: Secondary | ICD-10-CM | POA: Insufficient documentation

## 2013-09-30 DIAGNOSIS — Z862 Personal history of diseases of the blood and blood-forming organs and certain disorders involving the immune mechanism: Secondary | ICD-10-CM | POA: Insufficient documentation

## 2013-09-30 DIAGNOSIS — IMO0001 Reserved for inherently not codable concepts without codable children: Secondary | ICD-10-CM | POA: Insufficient documentation

## 2013-09-30 DIAGNOSIS — F3289 Other specified depressive episodes: Secondary | ICD-10-CM | POA: Insufficient documentation

## 2013-09-30 DIAGNOSIS — E669 Obesity, unspecified: Secondary | ICD-10-CM | POA: Insufficient documentation

## 2013-09-30 LAB — GLUCOSE, CAPILLARY: Glucose-Capillary: 103 mg/dL — ABNORMAL HIGH (ref 70–99)

## 2013-09-30 MED ORDER — HYDROMORPHONE HCL 2 MG PO TABS: 2.0000 mg | ORAL_TABLET | Freq: Two times a day (BID) | ORAL | Status: DC | PRN

## 2013-09-30 NOTE — Progress Notes (Signed)
Subjective:     Patient ID: Heather Hanson, female   DOB: 01-Jul-1961, 52 y.o.   MRN: [id number]  HPI This is a 52 year old female who underwent laparoscopic cholecystectomy for cholecystectomy for cholelithiasis and chronic cholecystitis 2 weeks ago. She has pain at several of her port sites. She also states that she feels like her stomach is bloated right now. She is having bowel movements but this is with some difficulty right now. She is eating and has no nausea or vomiting. She had a set of labs yesterday due to this pain which are all normal. She comes in today to be evaluated.  Review of Systems     Objective:   Physical Exam Incisions are all clean without any evidence of infection or hematoma. She is tender at several of her 5 mm port sites but there is no evidence of any infection or hematoma at the end. Her abdomen is soft and not distended to my exam today. Overall she's not very tender.    Assessment:     Status post laparoscopic cholecystectomy     Plan:     I dont think she does not have anything more sinister than just some postoperative pain. We discussed placing heat on her incisions and she can't tolerate a cold or fibromyalgia. She states is oxycodone is not really help him void and give her prescription for Dilaudid. She states this is the only thing that really helps with her pain due to her long history of fibromyalgia. I will touch base with her later this week to see her she is doing. I don't think she needs any imaging at this point. She overall looks fairly well. I will plan on seeing her next week.

## 2013-09-30 NOTE — ED Notes (Signed)
Presents two weeks post cholecystectomy by Dr. Dwain Sarna, has seen Dr. Dwain Sarna today for nausea and vomiting, given ODT zofran, took one at home and immediately began to throw up. Green and yellow. Last BM one week ago, reports pain in right lower-mid quadrant associated with abdominal distention.  Hypoactive BS.

## 2013-10-01 ENCOUNTER — Emergency Department (HOSPITAL_COMMUNITY): Payer: 59

## 2013-10-01 ENCOUNTER — Emergency Department (HOSPITAL_COMMUNITY)
Admission: EM | Admit: 2013-10-01 | Discharge: 2013-10-01 | Disposition: A | Discharge status: Home / Self Care | Payer: 59 | Attending: Emergency Medicine | Admitting: Emergency Medicine

## 2013-10-01 DIAGNOSIS — K5900 Constipation, unspecified: Secondary | ICD-10-CM

## 2013-10-01 LAB — CBC WITH DIFFERENTIAL/PLATELET
Basophils Relative: 0 % (ref 0–1)
Eosinophils Relative: 1 % (ref 0–5)
Hemoglobin: 12 g/dL (ref 12.0–15.0)
Lymphocytes Relative: 15 % (ref 12–46)
MCH: 22.9 pg — ABNORMAL LOW (ref 26.0–34.0)
MCV: 69.8 fL — ABNORMAL LOW (ref 78.0–100.0)
Monocytes Absolute: 0.5 10*3/uL (ref 0.1–1.0)
Neutrophils Relative %: 77 % (ref 43–77)
RBC: 5.24 MIL/uL — ABNORMAL HIGH (ref 3.87–5.11)

## 2013-10-01 LAB — COMPREHENSIVE METABOLIC PANEL
ALT: 24 U/L (ref 0–35)
AST: 23 U/L (ref 0–37)
Albumin: 4.1 g/dL (ref 3.5–5.2)
CO2: 21 mEq/L (ref 19–32)
Calcium: 9.1 mg/dL (ref 8.4–10.5)
Chloride: 101 mEq/L (ref 96–112)
Creatinine, Ser: 0.77 mg/dL (ref 0.50–1.10)
GFR calc non Af Amer: >90 mL/min (ref >90)
Glucose, Bld: 104 mg/dL — ABNORMAL HIGH (ref 70–99)
Potassium: 3.2 mEq/L — ABNORMAL LOW (ref 3.5–5.1)
Sodium: 137 mEq/L (ref 135–145)
Total Bilirubin: 0.4 mg/dL (ref 0.3–1.2)

## 2013-10-01 LAB — URINALYSIS, ROUTINE W REFLEX MICROSCOPIC
Glucose, UA: NEGATIVE mg/dL
Ketones, ur: 15 mg/dL — AB
Leukocytes, UA: NEGATIVE
Protein, ur: NEGATIVE mg/dL
pH: 6 (ref 5.0–8.0)

## 2013-10-01 LAB — URINE MICROSCOPIC-ADD ON

## 2013-10-01 LAB — LIPASE, BLOOD: Lipase: 36 U/L (ref 11–59)

## 2013-10-01 MED ORDER — LACTULOSE 10 GM/15ML PO SOLN: 10.0000 g | Freq: Two times a day (BID) | ORAL | Status: DC

## 2013-10-01 MED ORDER — ONDANSETRON HCL 4 MG/2ML IJ SOLN
4.0000 mg | Freq: Once | INTRAMUSCULAR | Status: AC
  Administered 2013-10-01: 4 mg via INTRAVENOUS
  Filled 2013-10-01: qty 2

## 2013-10-01 MED ORDER — SODIUM CHLORIDE 0.9 % IV BOLUS (SEPSIS)
1000.0000 mL | Freq: Once | INTRAVENOUS | Status: AC
  Administered 2013-10-01: 1000 mL via INTRAVENOUS

## 2013-10-01 MED ORDER — LACTULOSE 10 GM/15ML PO SOLN
10.0000 g | Freq: Once | ORAL | Status: AC
  Administered 2013-10-01: 10 g via ORAL
  Filled 2013-10-01: qty 15

## 2013-10-01 NOTE — ED Provider Notes (Signed)
CSN: [id number]     Arrival date & time 09/30/13  2241 History   First MD Initiated Contact with Patient 10/01/13 0140     Chief Complaint  Patient presents with  . Post-op Problem   (Consider location/radiation/quality/duration/timing/severity/associated sxs/prior Treatment) HPI Patient had her colon removed 2 weeks ago by Dr. Dwain Sarna. Since that time she's had intermittent nausea and vomiting. She also complains of abdominal distention and decreased bowel movements. She doesn't remember when she last passed gas. She denies any fevers chills. Past Medical History  Diagnosis Date  . Anemia     thalassemia  . Hyperlipidemia     not on any meds   . Seizure disorder x 2    presumed secondary to ativan withdrawal -Dr Sharene Skeans  . Bursitis of right shoulder   . PUD (peptic ulcer disease)     Dr Loreta Ave ( [facility]. Pyl[facility])  . History of colonoscopy 2000  . Chronic insomnia   . Obesity   . Sinusitis, acute 04/09/2013    takes Zyrtec daily  . Allergic state 04/09/2013  . H. pylori infection 07/19/2013  . Cholecystitis, acute 08/30/2013  . Constipation     takes Linzess daily prn  . Anxiety     takes Ativan daily prn  . Nausea     takes Phenergan prn  . Muscle spasms of head and/or neck     takes Norflex daily prn  . Depression     takes Effexor daily  . Peripheral edema     takes Lasix prn  . Complication of anesthesia     hard to sedate  . Fibromyalgia   . Shortness of breath     pt states when Fibromyalgia flares up  . History of bronchitis     last time many yrs ago  . Migraines     takes Topamax daily-last migraine today  . Headache(784.0) 05/22/2013  . Arthritis     sees Dr.Beekman  . Lichen     Dr[facility]Fleisher at [facility]  . H/O hiatal hernia   . GERD (gastroesophageal reflux disease) 06/17/2013    takes Dexilant daily prn   Past Surgical History  Procedure Laterality Date  . Tonsillectomy  1972  . Abdominal hysterectomy  2000    partial  . Colonoscopy    . Cholecystectomy  N/A 09/16/2013    Procedure: LAPAROSCOPIC CHOLECYSTECTOMY;  Surgeon: Emelia Loron, MD;  Location: [facility] OR;  Service: General;  Laterality: N/A;   Family History  Problem Relation Age of Onset  . Cancer      Breast < 54 yo, ,Prostate <50 yo  . Coronary artery disease      <60 yo  . Diabetes    . Diabetes Mother   . Cancer Father     prostate  . COPD Father    History  Substance Use Topics  . Smoking status: Never Smoker   . Smokeless tobacco: Never Used  . Alcohol Use: Yes     Comment: wine rarely   OB History   Grav Para Term Preterm Abortions TAB SAB Ect Mult Living                 Review of Systems  Constitutional: Negative for fever and chills.  Respiratory: Negative for shortness of breath.   Cardiovascular: Negative for chest pain.  Gastrointestinal: Positive for nausea, vomiting, abdominal pain, constipation and abdominal distention. Negative for diarrhea and blood in stool.  Genitourinary: Negative for dysuria, frequency, hematuria and flank pain.  Musculoskeletal: Negative  for back pain, myalgias, neck pain and neck stiffness.  Skin: Negative for rash and wound.  Neurological: Negative for dizziness, weakness, light-headedness, numbness and headaches.  All other systems reviewed and are negative.    Allergies  Augmentin; Dexilant; Hydrocodone-acetaminophen; Levsin; Linzess; Promethazine; Propoxyphene-acetaminophen; Acetaminophen; Latex; and Oxycodone-acetaminophen  Home Medications   Current Outpatient Rx  Name  Route  Sig  Dispense  Refill  . cetirizine (ZYRTEC) 10 MG tablet   Oral   Take 1 tablet (10 mg total) by mouth daily as needed for allergies or rhinitis.   30 tablet   5   . hyoscyamine (LEVSIN SL) 0.125 MG SL tablet   Sublingual   Place 1 tablet (0.125 mg total) under the tongue every 4 (four) hours as needed for cramping.   60 tablet   1   . LORazepam (ATIVAN) 2 MG tablet   Oral   Take 1 tablet (2 mg total) by mouth every 6 (six) hours  as needed for anxiety.   120 tablet   1   . NALTREXONE HCL PO   Oral   Take 4 mg by mouth at bedtime.         . orphenadrine (NORFLEX) 100 MG tablet   Oral   Take 1 tablet (100 mg total) by mouth 2 (two) times daily as needed for muscle spasms or pain.   60 tablet   3   . oxyCODONE (OXY IR/ROXICODONE) 5 MG immediate release tablet   Oral   Take 1-2 tablets (5-10 mg total) by mouth every 6 (six) hours as needed for pain.   30 tablet   0   . Oxycodone HCl 10 MG TABS   Oral   Take 10-20 mg by mouth at bedtime as needed. Pain         . promethazine (PHENERGAN) 12.5 MG tablet   Oral   Take 12.5 mg by mouth every 6 (six) hours as needed for nausea.         Marland Kitchen saccharomyces boulardii (FLORASTOR) 250 MG capsule   Oral   Take 1 capsule (250 mg total) by mouth daily.   30 capsule   3   . topiramate (TOPAMAX) 50 MG tablet   Oral   Take 1 tablet (50 mg total) by mouth 3 (three) times daily.   90 tablet   2   . venlafaxine (EFFEXOR) 75 MG tablet   Oral   Take 1 tablet (75 mg total) by mouth 3 (three) times daily.   90 tablet   1   . furosemide (LASIX) 20 MG tablet   Oral   Take 20 mg by mouth daily as needed for edema.         Marland Kitchen HYDROmorphone (DILAUDID) 2 MG tablet   Oral   Take 1 tablet (2 mg total) by mouth every 12 (twelve) hours as needed for pain.   20 tablet   0   . mometasone (NASONEX) 50 MCG/ACT nasal spray   Nasal   Place 2 sprays into the nose daily as needed. Allergies          BP 144/89  Pulse 80  Temp(Src) 97.6 F (36.4 C) (Oral)  Resp 18  SpO2 98% Physical Exam  Nursing note and vitals reviewed. Constitutional: She is oriented to person, place, and time. She appears well-developed and well-nourished. No distress.  HENT:  Head: Normocephalic and atraumatic.  Mouth/Throat: Oropharynx is clear and moist.  Eyes: EOM are normal. Pupils are equal, round, and reactive  to light.  Neck: Normal range of motion. Neck supple.  Cardiovascular:  Normal rate and regular rhythm.   Pulmonary/Chest: Effort normal and breath sounds normal. No respiratory distress. She has no wheezes. She has no rales.  Abdominal: Soft. Bowel sounds are normal. She exhibits distension (Abdomen is mildly distended.). She exhibits no mass. There is tenderness (mild tenderness over surgical incision sites. No evidence of infection.). There is no rebound and no guarding.  Decreased bowel sounds throughout.  Musculoskeletal: Normal range of motion. She exhibits no edema and no tenderness.  Neurological: She is alert and oriented to person, place, and time.  Patient is alert and oriented x3 with clear, goal oriented speech. Patient has 5/5 motor in all extremities. Sensation is intact to light touch.   Skin: Skin is warm and dry. No rash noted. No erythema.  Psychiatric: She has a normal mood and affect. Her behavior is normal.    ED Course  Procedures (including critical care time) Labs Review Labs Reviewed  CBC WITH DIFFERENTIAL - Abnormal; Notable for the following:    RBC 5.24 (*)    MCV 69.8 (*)    MCH 22.9 (*)    All other components within normal limits  COMPREHENSIVE METABOLIC PANEL - Abnormal; Notable for the following:    Potassium 3.2 (*)    Glucose, Bld 104 (*)    All other components within normal limits  URINALYSIS, ROUTINE W REFLEX MICROSCOPIC - Abnormal; Notable for the following:    Hgb urine dipstick TRACE (*)    Ketones, ur 15 (*)    All other components within normal limits  GLUCOSE, CAPILLARY - Abnormal; Notable for the following:    Glucose-Capillary 103 (*)    All other components within normal limits  URINE MICROSCOPIC-ADD ON - Abnormal; Notable for the following:    Squamous Epithelial / LPF FEW (*)    Bacteria, UA MANY (*)    All other components within normal limits  LIPASE, BLOOD   Imaging Review US Abdomen Complete  10/01/2013   CLINICAL DATA:  Abdominal pain. Cholecystectomy 2 weeks ago.  EXAM: ULTRASOUND ABDOMEN  COMPLETE  COMPARISON:  07/1913  FINDINGS: Gallbladder  Surgically absent.  Common bile duct  Diameter: 4.7 mm, normal  Liver  Incomplete visualization of the left liver. No focal lesion identified. Within normal limits in parenchymal echogenicity.  IVC  No abnormality visualized.  Pancreas  Obscured by overlying bowel gas.  Spleen  Size and appearance within normal limits.  Right Kidney  Length: 10 cm. Portions are not visualized due to overlying bowel gas. Echogenicity within normal limits. No mass or hydronephrosis visualized.  Left Kidney  Length: 10.3 cm. Portions are not visualized due to overlying bowel gas. Echogenicity within normal limits. No mass or hydronephrosis visualized.  Abdominal aorta  No aneurysm visualized.  IMPRESSION: Technically limited study due to bowel gas. Surgical absence of the gallbladder. Visualized abdominal structures are unremarkable.   Electronically Signed   By: Burman Nieves M.D.   On: 10/01/2013 02:53   Dg Abd Acute W/chest  10/01/2013   CLINICAL DATA:  Abdominal pain, shortness of breath, vomiting.  EXAM: ACUTE ABDOMEN SERIES (ABDOMEN 2 VIEW & CHEST 1 VIEW)  COMPARISON:  Chest 01/15/2013  FINDINGS: There is no evidence of dilated bowel loops or free intraperitoneal air. No radiopaque calculi or other significant radiographic abnormality is seen. Surgical clips in the right upper quadrant. Degenerative changes in the spine and hips. Heart size and mediastinal contours are within normal  limits. Both lungs are clear.  IMPRESSION: Negative abdominal radiographs.  No acute cardiopulmonary disease.   Electronically Signed   By: Burman Nieves M.D.   On: 10/01/2013 03:09    EKG Interpretation   None       MDM  Abdominal ultrasound and plain abdominal x-rays to rule out retained stone versus bowel obstruction. More than likely I think the patient is having ileus related to her recent surgery. She states she's been on several anti-diarrheal medications without any  relief.  Imaging without focal findings. Lab work is within normal limits. Start patient on low dose of lactulose and have her followup with her primary Dr. Return precautions given.  Loren Racer, MD 10/01/13 0500

## 2013-10-01 NOTE — ED Notes (Signed)
Pt states that she is still nauseated

## 2013-10-01 NOTE — ED Notes (Signed)
Nurse First Rounds : Nurse explained delay / process and wait time to pt. Pt. 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

## 2013-10-01 NOTE — ED Notes (Signed)
Vital signs stable. 

## 2013-10-02 ENCOUNTER — Telehealth (INDEPENDENT_AMBULATORY_CARE_PROVIDER_SITE_OTHER): Payer: Self-pay

## 2013-10-02 NOTE — Telephone Encounter (Signed)
Called pt to check on her from her last visit on Tuesday with Dr Dwain Sarna and she said she feels much better with the pain. The pt is having trouble with getting her bowels to move now. The pt ended up going to the ER yesterday and they gave her a rx to take for her constipation. The pt has started taking the rx but nothing has happened yet with a BM. I advised pt that she needed to be on a light diet with plenty of liquids until she gets her bowels to move. I advised pt that taking pain medicine will slow her bowels down as well but the pt states that she has only taken one Dilaudid. I advised her that otc Miralax would help with her bowels as well but the pt stated that she was going to just take the rx that the ER had prescribed for her. The pt has a f/u appt with Dr Dwain Sarna on 10/07/13.

## 2013-10-06 ENCOUNTER — Other Ambulatory Visit: Payer: Self-pay | Admitting: Family Medicine

## 2013-10-06 NOTE — Telephone Encounter (Signed)
RX faxed

## 2013-10-06 NOTE — Telephone Encounter (Signed)
Please advise refill? Last RX was done on 07-15-14 quantity 120 with 1 refill.  If ok fax to [phone]

## 2013-10-07 ENCOUNTER — Encounter (INDEPENDENT_AMBULATORY_CARE_PROVIDER_SITE_OTHER): Payer: Self-pay | Admitting: General Surgery

## 2013-10-07 ENCOUNTER — Ambulatory Visit (INDEPENDENT_AMBULATORY_CARE_PROVIDER_SITE_OTHER): Payer: Commercial Managed Care - PPO | Admitting: General Surgery

## 2013-10-07 ENCOUNTER — Encounter (INDEPENDENT_AMBULATORY_CARE_PROVIDER_SITE_OTHER): Payer: Self-pay

## 2013-10-07 VITALS — BP 130/80 | HR 72 | Temp 97.3°F | Resp 14 | Ht 68.0 in | Wt 207.4 lb

## 2013-10-07 DIAGNOSIS — Z09 Encounter for follow-up examination after completed treatment for conditions other than malignant neoplasm: Secondary | ICD-10-CM

## 2013-10-07 NOTE — Progress Notes (Signed)
Subjective:     Patient ID: Heather Hanson, female   DOB: [birth year], 52 y.o.   MRN: [id number]  HPI This is a 52 year old female who underwent laparoscopic cholecystectomy for cholecystectomy for cholelithiasis and chronic cholecystitis 2 weeks ago. She has pain at several of her port sites but primarily in her right abdominal wall now.  She is eating and has no nausea or vomiting.  She is passing a lot more gas but not having reliable bms.  She has some lactulose and ex lax that she says she is trying.  She feels much better today.  Review of Systems     Objective:   Physical Exam Incisions healing well without infection, some tenderness on right abdominal wall near one of the trocar sites, nondistended    Assessment:     S/p lap chole     Plan:     I think she is slowly getting better now. I think her abdominal wall pain will go away with some time. She is going to continue the laxatives to see her she can begin having more regular bowel movements. I will plan on seeing her back in 2 weeks again.

## 2013-10-08 ENCOUNTER — Encounter (INDEPENDENT_AMBULATORY_CARE_PROVIDER_SITE_OTHER): Payer: Self-pay

## 2013-10-14 ENCOUNTER — Ambulatory Visit (INDEPENDENT_AMBULATORY_CARE_PROVIDER_SITE_OTHER): Payer: 59 | Admitting: Family Medicine

## 2013-10-14 ENCOUNTER — Encounter: Payer: Self-pay | Admitting: Family Medicine

## 2013-10-14 VITALS — BP 122/76 | HR 76 | Temp 99.1°F | Ht 68.0 in | Wt 210.1 lb

## 2013-10-14 DIAGNOSIS — K810 Acute cholecystitis: Secondary | ICD-10-CM

## 2013-10-14 DIAGNOSIS — E876 Hypokalemia: Secondary | ICD-10-CM

## 2013-10-14 DIAGNOSIS — R51 Headache: Secondary | ICD-10-CM

## 2013-10-14 DIAGNOSIS — G8929 Other chronic pain: Secondary | ICD-10-CM

## 2013-10-14 DIAGNOSIS — K219 Gastro-esophageal reflux disease without esophagitis: Secondary | ICD-10-CM

## 2013-10-14 DIAGNOSIS — K5900 Constipation, unspecified: Secondary | ICD-10-CM

## 2013-10-14 MED ORDER — SODIUM POLYSTYRENE SULFONATE PO POWD: Freq: Once | ORAL | Status: DC

## 2013-10-14 NOTE — Patient Instructions (Signed)
Milk of Magnesium 2 tbls in 4 oz of warm prune juice may repeat in 4 hours as needed with a Dulcolax suppository per rectum at same time  Constipation, Adult Constipation is when a person has fewer than 3 bowel movements a week; has difficulty having a bowel movement; or has stools that are dry, hard, or larger than normal. As people grow older, constipation is more common. If you try to fix constipation with medicines that make you have a bowel movement (laxatives), the problem may get worse. Long-term laxative use may cause the muscles of the colon to become weak. A low-fiber diet, not taking in enough fluids, and taking certain medicines may make constipation worse. CAUSES   Certain medicines, such as antidepressants, pain medicine, iron supplements, antacids, and water pills.   Certain diseases, such as diabetes, irritable bowel syndrome (IBS), thyroid disease, or depression.   Not drinking enough water.   Not eating enough fiber-rich foods.   Stress or travel.  Lack of physical activity or exercise.  Not going to the restroom when there is the urge to have a bowel movement.  Ignoring the urge to have a bowel movement.  Using laxatives too much. SYMPTOMS   Having fewer than 3 bowel movements a week.   Straining to have a bowel movement.   Having hard, dry, or larger than normal stools.   Feeling full or bloated.   Pain in the lower abdomen.  Not feeling relief after having a bowel movement. DIAGNOSIS  Your caregiver will take a medical history and perform a physical exam. Further testing may be done for severe constipation. Some tests may include:   A barium enema X-ray to examine your rectum, colon, and sometimes, your small intestine.  A sigmoidoscopy to examine your lower colon.  A colonoscopy to examine your entire colon. TREATMENT  Treatment will depend on the severity of your constipation and what is causing it. Some dietary treatments include drinking  more fluids and eating more fiber-rich foods. Lifestyle treatments may include regular exercise. If these diet and lifestyle recommendations do not help, your caregiver may recommend taking over-the-counter laxative medicines to help you have bowel movements. Prescription medicines may be prescribed if over-the-counter medicines do not work.  HOME CARE INSTRUCTIONS   Increase dietary fiber in your diet, such as fruits, vegetables, whole grains, and beans. Limit high-fat and processed sugars in your diet, such as [place] fries, hamburgers, cookies, candies, and soda.   A fiber supplement may be added to your diet if you cannot get enough fiber from foods.   Drink enough fluids to keep your urine clear or pale yellow.   Exercise regularly or as directed by your caregiver.   Go to the restroom when you have the urge to go. Do not hold it.  Only take medicines as directed by your caregiver. Do not take other medicines for constipation without talking to your caregiver first. SEEK IMMEDIATE MEDICAL CARE IF:   You have bright red blood in your stool.   Your constipation lasts for more than 4 days or gets worse.   You have abdominal or rectal pain.   You have thin, pencil-like stools.  You have unexplained weight loss. MAKE SURE YOU:   Understand these instructions.  Will watch your condition.  Will get help right away if you are not doing well or get worse. Document Released: 08/25/2004 Document Revised: 02/19/2012 Document Reviewed: 10/31/2011 [facility] Patient Information 2014 [employer], [place]. rm prune juice can repeat in 4  hours as needed.

## 2013-10-14 NOTE — Progress Notes (Signed)
Patient ID: Heather Hanson, female   DOB: 1961-03-14, 52 y.o.   MRN: [id number] MAYE PARKINSON [id number] [birth year] 10/14/2013      Progress Note-Follow Up  Subjective  Chief Complaint  Chief Complaint  Patient presents with  . Follow-up    HPI  Patient is a 52 yo female. Has undergone a cholecystecomy and is beginning to feel better. Her nausea is better. She does still have some abdominal pain but it is improved. Is struggling with constipation s/p surgery, no bloody stool, no nausea, no vomiting, no anorexia. No cp/palp/sob/gu c/o. She feels her Effexor is helping her tolerate her stressors and she is dong somewhat better. No recent suicidal ideation  Past Medical History  Diagnosis Date  . Anemia     thalassemia  . Hyperlipidemia     not on any meds   . Seizure disorder x 2    presumed secondary to ativan withdrawal -Dr Sharene Skeans  . Bursitis of right shoulder   . PUD (peptic ulcer disease)     Dr Loreta Ave ( H. Pylori)  . History of colonoscopy 2000  . Chronic insomnia   . Obesity   . Sinusitis, acute 04/09/2013    takes Zyrtec daily  . Allergic state 04/09/2013  . H. pylori infection 07/19/2013  . Cholecystitis, acute 08/30/2013  . Constipation     takes Linzess daily prn  . Anxiety     takes Ativan daily prn  . Nausea     takes Phenergan prn  . Muscle spasms of head and/or neck     takes Norflex daily prn  . Depression     takes Effexor daily  . Peripheral edema     takes Lasix prn  . Complication of anesthesia     hard to sedate  . Fibromyalgia   . Shortness of breath     pt states when Fibromyalgia flares up  . History of bronchitis     last time many yrs ago  . Migraines     takes Topamax daily-last migraine today  . Headache(784.0) 05/22/2013  . Arthritis     sees Dr.Beekman  . Lichen     Dr[facility]Fleisher at [facility]  . H/O hiatal hernia   . GERD (gastroesophageal reflux disease) 06/17/2013    takes Dexilant daily prn    Past Surgical History   Procedure Laterality Date  . Tonsillectomy  1972  . Abdominal hysterectomy  2000    partial  . Colonoscopy    . Cholecystectomy N/A 09/16/2013    Procedure: LAPAROSCOPIC CHOLECYSTECTOMY;  Surgeon: Emelia Loron, MD;  Location: [facility] OR;  Service: General;  Laterality: N/A;    Family History  Problem Relation Age of Onset  . Cancer      Breast < 63 yo, ,Prostate <50 yo  . Coronary artery disease      <60 yo  . Diabetes    . Diabetes Mother   . Cancer Father     prostate  . COPD Father     History   Social History  . Marital Status: Divorced    Spouse Name: N/A    Number of Children: 2  . Years of Education: N/A   Occupational History  . 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 Smoking status: Never Smoker   . Smokeless tobacco: Never Used  . Alcohol Use: Yes     Comment: wine rarely  . Drug Use: No  . Sexual Activity: Yes  Birth Control/ Protection: Surgical   Other Topics Concern  . Not on file   Social History Narrative   Occupation: Water quality scientist   Divorced     2 sons 26, 17      Never Smoked     Alcohol use-no       Current Outpatient Prescriptions on File Prior to Visit  Medication Sig Dispense Refill  . cetirizine (ZYRTEC) 10 MG tablet Take 1 tablet (10 mg total) by mouth daily as needed for allergies or rhinitis.  30 tablet  5  . furosemide (LASIX) 20 MG tablet Take 20 mg by mouth daily as needed for edema.      . hyoscyamine (LEVSIN SL) 0.125 MG SL tablet Place 1 tablet (0.125 mg total) under the tongue every 4 (four) hours as needed for cramping.  60 tablet  1  . lactulose (CHRONULAC) 10 GM/15ML solution Take 15 mLs (10 g total) by mouth 2 (two) times daily.  240 mL  0  . LORazepam (ATIVAN) 2 MG tablet TAKE 1 TABLET BY MOUTH EVERY 6 HOURS AS NEEDED FOR ANXIETY  120 tablet  1  . mometasone (NASONEX) 50 MCG/ACT nasal spray Place 2 sprays into the nose daily as needed. Allergies      . NALTREXONE HCL PO Take 4 mg by mouth at  bedtime.      . orphenadrine (NORFLEX) 100 MG tablet Take 1 tablet (100 mg total) by mouth 2 (two) times daily as needed for muscle spasms or pain.  60 tablet  3  . promethazine (PHENERGAN) 12.5 MG tablet Take 12.5 mg by mouth every 6 (six) hours as needed for nausea.      Marland Kitchen saccharomyces boulardii (FLORASTOR) 250 MG capsule Take 1 capsule (250 mg total) by mouth daily.  30 capsule  3  . topiramate (TOPAMAX) 50 MG tablet Take 1 tablet (50 mg total) by mouth 3 (three) times daily.  90 tablet  2  . venlafaxine (EFFEXOR) 75 MG tablet Take 1 tablet (75 mg total) by mouth 3 (three) times daily.  90 tablet  1  . HYDROmorphone (DILAUDID) 2 MG tablet Take 1 tablet (2 mg total) by mouth every 12 (twelve) hours as needed for pain.  20 tablet  0   No current facility-administered medications on file prior to visit.    Allergies  Allergen Reactions  . Augmentin [Amoxicillin-Pot Clavulanate] Other (See Comments)    Blisters in the back of throat  . Dexilant [Dexlansoprazole] Other (See Comments)    Bowel urgency and liquid stools  . Hydrocodone-Acetaminophen Hives    REACTION: Rash  . Levsin [Hyoscyamine Sulfate] Other (See Comments)    Projectile vomitting  . Linzess [Linaclotide] Other (See Comments)    Bowel urgency and liquid stools  . Promethazine Nausea And Vomiting  . Propoxyphene-Acetaminophen Itching    REACTION: rash  . Acetaminophen Itching and Rash  . Latex Itching and Rash    Rash-looks like she has the measles per pt  . Oxycodone-Acetaminophen Itching and Rash    REACTION: Rash    Review of Systems  Review of Systems  Constitutional: Negative for fever and malaise/fatigue.  HENT: Negative for congestion.   Eyes: Negative for discharge.  Respiratory: Negative for shortness of breath.   Cardiovascular: Negative for chest pain, palpitations and leg swelling.  Gastrointestinal: Positive for abdominal pain and constipation. Negative for nausea, diarrhea, blood in stool and  melena.  Genitourinary: Negative for dysuria.  Musculoskeletal: Negative for falls.  Skin: Negative for rash.  Neurological: Negative for loss of consciousness and headaches.  Endo/Heme/Allergies: Negative for polydipsia.  Psychiatric/Behavioral: Negative for depression and suicidal ideas. The patient is not nervous/anxious and does not have insomnia.     Objective  BP 122/76  Pulse 76  Temp(Src) 99.1 F (37.3 C) (Oral)  Ht 5\' 8"  (1.727 m)  Wt 210 lb 1.3 oz (95.292 kg)  BMI 31.95 kg/m2  SpO2 99%  Physical Exam  Physical Exam  Constitutional: She is oriented to person, place, and time and well-developed, well-nourished, and in no distress. No distress.  HENT:  Head: Normocephalic and atraumatic.  Eyes: Conjunctivae are normal.  Neck: Neck supple. No thyromegaly present.  Cardiovascular: Normal rate, regular rhythm and normal heart sounds.   No murmur heard. Pulmonary/Chest: Effort normal and breath sounds normal. She has no wheezes.  Abdominal: She exhibits no distension and no mass.  Musculoskeletal: She exhibits no edema.  Lymphadenopathy:    She has no cervical adenopathy.  Neurological: She is alert and oriented to person, place, and time.  Skin: Skin is warm and dry. No rash noted. She is not diaphoretic.  Psychiatric: Memory, affect and judgment normal.    Lab Results  Component Value Date   TSH 2.217 04/08/2013   Lab Results  Component Value Date   WBC 7.1 09/30/2013   HGB 12.0 09/30/2013   HCT 36.6 09/30/2013   MCV 69.8* 09/30/2013   PLT 213 09/30/2013   Lab Results  Component Value Date   CREATININE 0.77 09/30/2013   BUN 12 09/30/2013   NA 137 09/30/2013   K 3.2* 09/30/2013   CL 101 09/30/2013   CO2 21 09/30/2013   Lab Results  Component Value Date   ALT 24 09/30/2013   AST 23 09/30/2013   ALKPHOS 112 09/30/2013   BILITOT 0.4 09/30/2013   Lab Results  Component Value Date   CHOL 321* 07/15/2013   Lab Results  Component Value Date   HDL 68  07/15/2013   Lab Results  Component Value Date   LDLCALC 226* 07/15/2013   Lab Results  Component Value Date   TRIG 133 07/15/2013   Lab Results  Component Value Date   CHOLHDL 4.7 07/15/2013     Assessment & Plan  Cholecystitis, acute Feeling much better s/p cholecystectomy. Encouraged probiotics.   CONSTIPATION Still struggling, is going to start back on Linzess, is given instructions to start MOM 2 tbls in 4 oz of warm prune juice ad repeat in 4 hours with Dulcolax suppository prn.   GERD (gastroesophageal reflux disease) Avoid offending foods, start a probiotic.   Chronic headaches Persistent but improved some, encouraged HA hygiene

## 2013-10-15 ENCOUNTER — Telehealth: Payer: Self-pay | Admitting: *Deleted

## 2013-10-15 NOTE — Telephone Encounter (Signed)
Received call from Twilight at [facility] pharmacy wanting to make sure we intended to order kayeaylate for pt as her potassium level was low. He states this is usually just prescribed when potassium is high.  Please advise.

## 2013-10-15 NOTE — Telephone Encounter (Signed)
She has been using it for constipation but cancel the [facility] for now til she repeats her renal panel.

## 2013-10-16 ENCOUNTER — Telehealth: Payer: Self-pay

## 2013-10-16 LAB — RENAL FUNCTION PANEL
Albumin: 4 g/dL (ref 3.5–5.2)
BUN: 10 mg/dL (ref 6–23)
Calcium: 8.9 mg/dL (ref 8.4–10.5)
Phosphorus: 3.3 mg/dL (ref 2.3–4.6)
Potassium: 3.3 mEq/L — ABNORMAL LOW (ref 3.5–5.3)
Sodium: 140 mEq/L (ref 135–145)

## 2013-10-16 NOTE — Telephone Encounter (Signed)
Patient came to get labs done today and wanted her renal panel to be stat.   Verbal per MD its to late to order now. Its ok to wait for the labs to come back

## 2013-10-16 NOTE — Telephone Encounter (Signed)
Notified Ben at [employer] of changes.

## 2013-10-17 ENCOUNTER — Telehealth: Payer: Self-pay | Admitting: Family Medicine

## 2013-10-17 MED ORDER — POTASSIUM CHLORIDE CRYS ER 20 MEQ PO TBCR: 20.0000 meq | EXTENDED_RELEASE_TABLET | ORAL | Status: DC

## 2013-10-17 NOTE — Telephone Encounter (Signed)
Look at other note

## 2013-10-17 NOTE — Telephone Encounter (Signed)
Please call patient as soon as possible.  She is wanting to know the potassium level and if it is okay to pick up her prescription. [phone] and she states that it is okay to leave a detailed message.

## 2013-10-19 NOTE — Assessment & Plan Note (Signed)
Feeling much better s/p cholecystectomy. Encouraged probiotics.

## 2013-10-19 NOTE — Assessment & Plan Note (Signed)
Avoid offending foods, start a probiotic.

## 2013-10-19 NOTE — Assessment & Plan Note (Signed)
Persistent but improved some, encouraged HA hygiene

## 2013-10-19 NOTE — Assessment & Plan Note (Signed)
Still struggling, is going to start back on Linzess, is given instructions to start MOM 2 tbls in 4 oz of warm prune juice ad repeat in 4 hours with Dulcolax suppository prn.

## 2013-10-21 ENCOUNTER — Ambulatory Visit (INDEPENDENT_AMBULATORY_CARE_PROVIDER_SITE_OTHER): Payer: Commercial Managed Care - PPO | Admitting: General Surgery

## 2013-10-21 ENCOUNTER — Encounter (INDEPENDENT_AMBULATORY_CARE_PROVIDER_SITE_OTHER): Payer: Self-pay | Admitting: General Surgery

## 2013-10-21 VITALS — BP 124/68 | HR 88 | Temp 97.6°F | Resp 16 | Ht 68.0 in | Wt 210.8 lb

## 2013-10-21 DIAGNOSIS — Z09 Encounter for follow-up examination after completed treatment for conditions other than malignant neoplasm: Secondary | ICD-10-CM

## 2013-10-22 NOTE — Progress Notes (Signed)
Subjective:     Patient ID: Heather Hanson, female   DOB: January 16, 1961, 52 y.o.   MRN: [id number]  HPI This is a 52 year old female who underwent laparoscopic cholecystectomy for cholecystectomy for cholelithiasis and chronic cholecystitis several weeks ago. She has had a number of issues including constipation as well as abdominal wall pain.  Her fibromyalgia has certainly made this more difficult.  She has no n/v/fevers or any drainage from her wounds.  She says she is considering trying to drain her umbilical incision as she feels this will help.  There is no fluid or anything to be drained there.  She does complain of pain from umbilicus to her right lateral port site.  Review of Systems     Objective:   Physical Exam Incisions all clean without infection, healing well, abdomen nontender, no hernia    Assessment:     S/p lap chole     Plan:     I think she is slowly progressing and most of symptoms are exacerbated by her underlying condition.  There is no reason to evaluate any further.  I will plan on seeing her back in a few weeks again and if has any other issues will call.

## 2013-10-23 ENCOUNTER — Telehealth (INDEPENDENT_AMBULATORY_CARE_PROVIDER_SITE_OTHER): Payer: Self-pay

## 2013-10-23 NOTE — Telephone Encounter (Signed)
Patient called in stating she cannot come to her scheduled appointment on Monday. She can only come in on Tuesday afternoons. Spoke to [place] and she states Dr Dwain Sarna does not have any more time on Tuesdays for the rest of the year. I let pt know and she says she will just wait until January then to see Dr Dwain Sarna. I let her know Elease Hashimoto will call her with an appointment once the January schedule is available.

## 2013-10-27 ENCOUNTER — Ambulatory Visit (HOSPITAL_BASED_OUTPATIENT_CLINIC_OR_DEPARTMENT_OTHER)
Admission: RE | Admit: 2013-10-27 | Discharge: 2013-10-27 | Disposition: A | Discharge status: Home / Self Care | Payer: 59 | Source: Ambulatory Visit | Attending: Family Medicine | Admitting: Family Medicine

## 2013-10-27 ENCOUNTER — Other Ambulatory Visit: Payer: Self-pay | Admitting: Family Medicine

## 2013-10-27 ENCOUNTER — Telehealth: Payer: Self-pay | Admitting: Family Medicine

## 2013-10-27 DIAGNOSIS — R05 Cough: Secondary | ICD-10-CM

## 2013-10-27 DIAGNOSIS — R0989 Other specified symptoms and signs involving the circulatory and respiratory systems: Secondary | ICD-10-CM | POA: Insufficient documentation

## 2013-10-27 DIAGNOSIS — R059 Cough, unspecified: Secondary | ICD-10-CM | POA: Insufficient documentation

## 2013-10-27 NOTE — Telephone Encounter (Signed)
Pt came in office is not feeling any better, request chest xray

## 2013-10-28 NOTE — Telephone Encounter (Signed)
Patient left message on nurse voicemail on 10/27/13 at 5:51pm requesting chest x-ray results. Best # [phone]

## 2013-10-28 NOTE — Telephone Encounter (Signed)
MD sent through mychart.  I resent

## 2013-10-30 ENCOUNTER — Telehealth: Payer: Self-pay | Admitting: Family Medicine

## 2013-10-30 NOTE — Telephone Encounter (Signed)
OK to write her a note. OK to return to work as of today

## 2013-10-30 NOTE — Telephone Encounter (Signed)
CAME IN TO WORK TODAY AND WAS TOLD SHE NEEDS A NOTE TO RETURN TO WORK.

## 2013-10-30 NOTE — Telephone Encounter (Signed)
Please advise 

## 2013-10-30 NOTE — Telephone Encounter (Signed)
Letter done and steph is informing pt

## 2013-11-04 ENCOUNTER — Other Ambulatory Visit: Payer: Self-pay | Admitting: Family Medicine

## 2013-11-04 NOTE — Telephone Encounter (Signed)
Please advise refill?  Last RX was done on 07-15-13 quantity 90 with 2 refills  If ok fax to [phone]

## 2013-11-11 ENCOUNTER — Ambulatory Visit: Payer: Self-pay | Admitting: Family Medicine

## 2013-11-17 ENCOUNTER — Encounter (INDEPENDENT_AMBULATORY_CARE_PROVIDER_SITE_OTHER): Payer: Commercial Managed Care - PPO | Admitting: General Surgery

## 2013-12-01 ENCOUNTER — Other Ambulatory Visit: Payer: Self-pay | Admitting: Family Medicine

## 2013-12-02 NOTE — Telephone Encounter (Signed)
rx sent

## 2013-12-02 NOTE — Telephone Encounter (Signed)
Please advise refill? Last Lorazepam RX was wrote on 10-06-13 quantity 120 with 1 refill  If ok fax to [phone]

## 2013-12-09 ENCOUNTER — Encounter: Payer: Self-pay | Admitting: Family Medicine

## 2013-12-09 ENCOUNTER — Ambulatory Visit (INDEPENDENT_AMBULATORY_CARE_PROVIDER_SITE_OTHER): Payer: 59 | Admitting: Family Medicine

## 2013-12-09 VITALS — BP 110/80 | HR 74 | Temp 98.3°F | Ht 68.0 in | Wt 207.0 lb

## 2013-12-09 DIAGNOSIS — D649 Anemia, unspecified: Secondary | ICD-10-CM

## 2013-12-09 DIAGNOSIS — G43909 Migraine, unspecified, not intractable, without status migrainosus: Secondary | ICD-10-CM

## 2013-12-09 DIAGNOSIS — K5900 Constipation, unspecified: Secondary | ICD-10-CM

## 2013-12-09 DIAGNOSIS — R569 Unspecified convulsions: Secondary | ICD-10-CM

## 2013-12-09 DIAGNOSIS — R109 Unspecified abdominal pain: Secondary | ICD-10-CM

## 2013-12-09 DIAGNOSIS — E785 Hyperlipidemia, unspecified: Secondary | ICD-10-CM

## 2013-12-09 DIAGNOSIS — F4322 Adjustment disorder with anxiety: Secondary | ICD-10-CM

## 2013-12-09 MED ORDER — ORPHENADRINE CITRATE ER 100 MG PO TB12: 100.0000 mg | ORAL_TABLET | Freq: Two times a day (BID) | ORAL | Status: DC | PRN

## 2013-12-09 NOTE — Progress Notes (Signed)
Pre visit review using our clinic review tool, if applicable. No additional management support is needed unless otherwise documented below in the visit note. 

## 2013-12-09 NOTE — Progress Notes (Signed)
Patient ID: Heather Hanson, female   DOB: 1961-05-20, 52 y.o.   MRN: [id number] Heather Hanson [id number] [birth year] 12/09/2013      Progress Note-Follow Up  Subjective  Chief Complaint  Chief Complaint  Patient presents with  . Follow-up    pt made a mychart    HPI  Patient is a 52 year old Afro-American female who is in today for followup. She is doing better. She notes increasing venlafaxine has helped her stay, during stressful situations. She continues to struggle with constipation for nausea but finds it tolerable with castor oil. No chest pain or palpitations. No recent elements shortness of breath, GI or GU concerns otherwise.  Past Medical History  Diagnosis Date  . Anemia     thalassemia  . Hyperlipidemia     not on any meds   . Seizure disorder x 2    presumed secondary to ativan withdrawal -Dr Sharene Skeans  . Bursitis of right shoulder   . PUD (peptic ulcer disease)     Dr Loreta Ave ( H. Pylori)  . History of colonoscopy 2000  . Chronic insomnia   . Obesity   . Sinusitis, acute 04/09/2013    takes Zyrtec daily  . Allergic state 04/09/2013  . H. pylori infection 07/19/2013  . Cholecystitis, acute 08/30/2013  . Constipation     takes Linzess daily prn  . Anxiety     takes Ativan daily prn  . Nausea     takes Phenergan prn  . Muscle spasms of head and/or neck     takes Norflex daily prn  . Depression     takes Effexor daily  . Peripheral edema     takes Lasix prn  . Complication of anesthesia     hard to sedate  . Fibromyalgia   . Shortness of breath     pt states when Fibromyalgia flares up  . History of bronchitis     last time many yrs ago  . Migraines     takes Topamax daily-last migraine today  . Headache(784.0) 05/22/2013  . Arthritis     sees Dr.Beekman  . Lichen     [facility]Fleisher at [facility]  . H/O hiatal hernia   . GERD (gastroesophageal reflux disease) 06/17/2013    takes Dexilant daily prn    Past Surgical History  Procedure Laterality Date   . Tonsillectomy  1972  . Abdominal hysterectomy  2000    partial  . Colonoscopy    . Cholecystectomy N/A 09/16/2013    Procedure: LAPAROSCOPIC CHOLECYSTECTOMY;  Surgeon: Emelia Loron, MD;  Location: [facility] OR;  Service: General;  Laterality: N/A;    Family History  Problem Relation Age of Onset  . Cancer      Breast < 67 yo, ,Prostate <50 yo  . Coronary artery disease      <60 yo  . Diabetes    . Diabetes Mother   . Cancer Father     prostate  . COPD Father     History   Social History  . Marital Status: Divorced    Spouse Name: N/A    Number of Children: 2  . Years of Education: N/A   Occupational History  . 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 Smoking status: Never Smoker   . Smokeless tobacco: Never Used  . Alcohol Use: Yes     Comment: wine rarely  . Drug Use: No  . Sexual Activity: Yes    Birth  Control/ Protection: Surgical   Other Topics Concern  . Not on file   Social History Narrative   Occupation: Water quality scientist   Divorced     2 sons 26, 15      Never Smoked     Alcohol use-no       Current Outpatient Prescriptions on File Prior to Visit  Medication Sig Dispense Refill  . cetirizine (ZYRTEC) 10 MG tablet Take 1 tablet (10 mg total) by mouth daily as needed for allergies or rhinitis.  30 tablet  5  . furosemide (LASIX) 20 MG tablet Take 20 mg by mouth daily as needed for edema.      Marland Kitchen LORazepam (ATIVAN) 2 MG tablet TAKE 1 TABLET BY MOUTH EVERY 6 HOURS AS NEEDED FOR ANXIETY  120 tablet  1  . mometasone (NASONEX) 50 MCG/ACT nasal spray Place 2 sprays into the nose daily as needed. Allergies      . NALTREXONE HCL PO Take 4 mg by mouth at bedtime.      . orphenadrine (NORFLEX) 100 MG tablet Take 1 tablet (100 mg total) by mouth 2 (two) times daily as needed for muscle spasms or pain.  60 tablet  3  . promethazine (PHENERGAN) 12.5 MG tablet Take 12.5 mg by mouth every 6 (six) hours as needed for nausea.      Marland Kitchen saccharomyces  boulardii (FLORASTOR) 250 MG capsule Take 1 capsule (250 mg total) by mouth daily.  30 capsule  3  . topiramate (TOPAMAX) 50 MG tablet TAKE 1 TABLET BY MOUTH 3 TIMES A DAY  90 tablet  2  . venlafaxine (EFFEXOR) 75 MG tablet TAKE 1 TABLET (75 MG TOTAL) BY MOUTH 3 (THREE) TIMES DAILY.  90 tablet  1   No current facility-administered medications on file prior to visit.    Allergies  Allergen Reactions  . Augmentin [Amoxicillin-Pot Clavulanate] Other (See Comments)    Blisters in the back of throat  . Dexilant [Dexlansoprazole] Other (See Comments)    Bowel urgency and liquid stools  . Hydrocodone-Acetaminophen Hives    REACTION: Rash  . Levsin [Hyoscyamine Sulfate] Other (See Comments)    Projectile vomitting  . Linzess [Linaclotide] Other (See Comments)    Bowel urgency and liquid stools  . Promethazine Nausea And Vomiting  . Propoxyphene N-Acetaminophen Itching    REACTION: rash  . Acetaminophen Itching and Rash  . Latex Itching and Rash    Rash-looks like she has the measles per pt  . Oxycodone-Acetaminophen Itching and Rash    REACTION: Rash    Review of Systems  Review of Systems  Constitutional: Negative for fever and malaise/fatigue.  HENT: Negative for congestion.   Eyes: Negative for discharge.  Respiratory: Negative for shortness of breath.   Cardiovascular: Negative for chest pain, palpitations and leg swelling.  Gastrointestinal: Negative for nausea, abdominal pain and diarrhea.  Genitourinary: Negative for dysuria.  Musculoskeletal: Negative for falls.  Skin: Negative for rash.  Neurological: Negative for loss of consciousness and headaches.  Endo/Heme/Allergies: Negative for polydipsia.  Psychiatric/Behavioral: Negative for depression and suicidal ideas. The patient is not nervous/anxious and does not have insomnia.     Objective  BP 110/80  Pulse 74  Temp(Src) 98.3 F (36.8 C) (Oral)  Ht 5\' 8"  (1.727 m)  Wt 207 lb 0.6 oz (93.913 kg)  BMI 31.49 kg/m2   SpO2 98%  Physical Exam  Physical Exam  Constitutional: She is oriented to person, place, and time and well-developed, well-nourished, and in no distress.  No distress.  HENT:  Head: Normocephalic and atraumatic.  Eyes: Conjunctivae are normal.  Neck: Neck supple. No thyromegaly present.  Cardiovascular: Normal rate, regular rhythm and normal heart sounds.   No murmur heard. Pulmonary/Chest: Effort normal and breath sounds normal. She has no wheezes.  Abdominal: She exhibits no distension and no mass.  Musculoskeletal: She exhibits no edema.  Lymphadenopathy:    She has no cervical adenopathy.  Neurological: She is alert and oriented to person, place, and time.  Skin: Skin is warm and dry. No rash noted. She is not diaphoretic.  Psychiatric: Memory, affect and judgment normal.    Lab Results  Component Value Date   TSH 2.217 04/08/2013   Lab Results  Component Value Date   WBC 7.1 09/30/2013   HGB 12.0 09/30/2013   HCT 36.6 09/30/2013   MCV 69.8* 09/30/2013   PLT 213 09/30/2013   Lab Results  Component Value Date   CREATININE 1.09 10/14/2013   BUN 10 10/14/2013   NA 140 10/14/2013   K 3.3* 10/14/2013   CL 103 10/14/2013   CO2 28 10/14/2013   Lab Results  Component Value Date   ALT 24 09/30/2013   AST 23 09/30/2013   ALKPHOS 112 09/30/2013   BILITOT 0.4 09/30/2013   Lab Results  Component Value Date   CHOL 321* 07/15/2013   Lab Results  Component Value Date   HDL 68 07/15/2013   Lab Results  Component Value Date   LDLCALC 226* 07/15/2013   Lab Results  Component Value Date   TRIG 133 07/15/2013   Lab Results  Component Value Date   CHOLHDL 4.7 07/15/2013     Assessment & Plan  ANEMIA-NOS Repeat cbc  CONSTIPATION Continues to struggle, notes Castoria is helpful, encouraged increased hydraiton, fiber and probiotics.  ADJUSTMENT DISORDER WITH ANXIETY Doing better on increased Venlafaxine. Continue same.   SEIZURE DISORDER No recent  activy  HYPERLIPIDEMIA Patient declines blood work but agrees to return for further consideration.

## 2013-12-09 NOTE — Assessment & Plan Note (Signed)
Repeat cbc

## 2013-12-15 NOTE — Assessment & Plan Note (Signed)
Doing better on increased Venlafaxine. Continue same.

## 2013-12-15 NOTE — Assessment & Plan Note (Signed)
Patient declines blood work but agrees to return for further consideration.

## 2013-12-15 NOTE — Assessment & Plan Note (Signed)
No recent activy

## 2013-12-15 NOTE — Assessment & Plan Note (Signed)
Continues to struggle, notes Ballard RussellCastoria is helpful, encouraged increased hydraiton, fiber and probiotics.

## 2013-12-30 ENCOUNTER — Encounter (INDEPENDENT_AMBULATORY_CARE_PROVIDER_SITE_OTHER): Payer: Commercial Managed Care - PPO | Admitting: General Surgery

## 2014-01-20 ENCOUNTER — Telehealth: Payer: Self-pay | Admitting: Family Medicine

## 2014-01-20 ENCOUNTER — Other Ambulatory Visit: Payer: Self-pay | Admitting: Hematology & Oncology

## 2014-01-20 DIAGNOSIS — L241 Irritant contact dermatitis due to oils and greases: Secondary | ICD-10-CM

## 2014-01-20 MED ORDER — CETIRIZINE HCL 10 MG PO TABS
10.0000 mg | ORAL_TABLET | Freq: Every day | ORAL | Status: DC | PRN
Start: 2014-01-20 — End: 2015-03-04

## 2014-01-20 MED ORDER — DOXEPIN HCL (ANTIPRURITIC) 5 % EX CREA: TOPICAL_CREAM | Freq: Three times a day (TID) | CUTANEOUS | Status: DC

## 2014-01-20 MED ORDER — TRIAMCINOLONE ACETONIDE 0.5 % EX CREA: 1.0000 "application " | TOPICAL_CREAM | Freq: Three times a day (TID) | CUTANEOUS | Status: DC

## 2014-01-20 NOTE — Telephone Encounter (Signed)
Message from pharmacy:  Patient would like zyrtec Rx so she can use her benny card. Zyrtec 10mg  1 QD prn #100

## 2014-01-27 ENCOUNTER — Encounter (INDEPENDENT_AMBULATORY_CARE_PROVIDER_SITE_OTHER): Payer: Commercial Managed Care - PPO | Admitting: General Surgery

## 2014-02-02 ENCOUNTER — Other Ambulatory Visit: Payer: Self-pay | Admitting: Family Medicine

## 2014-02-26 ENCOUNTER — Other Ambulatory Visit: Payer: Self-pay | Admitting: Hematology & Oncology

## 2014-02-26 DIAGNOSIS — L241 Irritant contact dermatitis due to oils and greases: Secondary | ICD-10-CM

## 2014-02-26 MED ORDER — TRIAMCINOLONE ACETONIDE 0.5 % EX CREA: 1.0000 "application " | TOPICAL_CREAM | Freq: Three times a day (TID) | CUTANEOUS | Status: DC

## 2014-03-09 ENCOUNTER — Other Ambulatory Visit: Payer: Self-pay | Admitting: Family Medicine

## 2014-03-09 LAB — CBC
HCT: 39.5 % (ref 36.0–46.0)
Hemoglobin: 12.6 g/dL (ref 12.0–15.0)
MCH: 22.1 pg — ABNORMAL LOW (ref 26.0–34.0)
MCHC: 31.9 g/dL (ref 30.0–36.0)
MCV: 69.4 fL — ABNORMAL LOW (ref 78.0–100.0)
Platelets: 221 10*3/uL (ref 150–400)
RBC: 5.69 MIL/uL — ABNORMAL HIGH (ref 3.87–5.11)
RDW: 15.3 % (ref 11.5–15.5)
WBC: 4.4 10*3/uL (ref 4.0–10.5)

## 2014-03-09 LAB — RENAL FUNCTION PANEL
Albumin: 3.9 g/dL (ref 3.5–5.2)
BUN: 12 mg/dL (ref 6–23)
CO2: 25 mEq/L (ref 19–32)
Calcium: 8.7 mg/dL (ref 8.4–10.5)
Chloride: 106 mEq/L (ref 96–112)
Creat: 1.09 mg/dL (ref 0.50–1.10)
Glucose, Bld: 95 mg/dL (ref 70–99)
Phosphorus: 3.5 mg/dL (ref 2.3–4.6)
Potassium: 3.9 mEq/L (ref 3.5–5.3)
Sodium: 139 mEq/L (ref 135–145)

## 2014-03-09 LAB — HEPATIC FUNCTION PANEL
ALT: 10 U/L (ref 0–35)
AST: 16 U/L (ref 0–37)
Albumin: 3.9 g/dL (ref 3.5–5.2)
Alkaline Phosphatase: 78 U/L (ref 39–117)
Bilirubin, Direct: 0.1 mg/dL (ref 0.0–0.3)
Indirect Bilirubin: 0.3 mg/dL (ref 0.0–0.9)
Total Bilirubin: 0.4 mg/dL (ref 0.3–1.2)
Total Protein: 6.7 g/dL (ref 6.0–8.3)

## 2014-03-09 LAB — LIPID PANEL
Cholesterol: 297 mg/dL — ABNORMAL HIGH (ref 0–200)
HDL: 81 mg/dL (ref >39)
LDL Cholesterol: 200 mg/dL — ABNORMAL HIGH (ref 0–99)
Total CHOL/HDL Ratio: 3.7 Ratio
Triglycerides: 79 mg/dL (ref <150)
VLDL: 16 mg/dL (ref 0–40)

## 2014-03-09 LAB — TSH: TSH: 4.732 u[IU]/mL — ABNORMAL HIGH (ref 0.350–4.500)

## 2014-03-10 ENCOUNTER — Ambulatory Visit (INDEPENDENT_AMBULATORY_CARE_PROVIDER_SITE_OTHER): Payer: 59 | Admitting: Family Medicine

## 2014-03-10 ENCOUNTER — Encounter: Payer: Self-pay | Admitting: Family Medicine

## 2014-03-10 ENCOUNTER — Ambulatory Visit: Payer: 59 | Admitting: Family Medicine

## 2014-03-10 VITALS — BP 124/72 | HR 75 | Temp 98.1°F | Ht 68.0 in | Wt 209.1 lb

## 2014-03-10 DIAGNOSIS — F4322 Adjustment disorder with anxiety: Secondary | ICD-10-CM

## 2014-03-10 DIAGNOSIS — R519 Headache, unspecified: Secondary | ICD-10-CM

## 2014-03-10 DIAGNOSIS — E039 Hypothyroidism, unspecified: Secondary | ICD-10-CM

## 2014-03-10 DIAGNOSIS — G43909 Migraine, unspecified, not intractable, without status migrainosus: Secondary | ICD-10-CM

## 2014-03-10 DIAGNOSIS — E876 Hypokalemia: Secondary | ICD-10-CM

## 2014-03-10 DIAGNOSIS — G8929 Other chronic pain: Secondary | ICD-10-CM

## 2014-03-10 DIAGNOSIS — E785 Hyperlipidemia, unspecified: Secondary | ICD-10-CM

## 2014-03-10 DIAGNOSIS — E7800 Pure hypercholesterolemia, unspecified: Secondary | ICD-10-CM

## 2014-03-10 DIAGNOSIS — IMO0001 Reserved for inherently not codable concepts without codable children: Secondary | ICD-10-CM

## 2014-03-10 DIAGNOSIS — R51 Headache: Secondary | ICD-10-CM

## 2014-03-10 LAB — T4, FREE: FREE T4: 0.81 ng/dL (ref 0.80–1.80)

## 2014-03-10 MED ORDER — LEVOTHYROXINE SODIUM 25 MCG PO TABS: 25.0000 ug | ORAL_TABLET | Freq: Every day | ORAL | Status: DC

## 2014-03-10 MED ORDER — B COMPLEX PO TABS: 1.0000 | ORAL_TABLET | Freq: Every day | ORAL | Status: DC

## 2014-03-10 MED ORDER — SUMATRIPTAN 20 MG/ACT NA SOLN: 20.0000 mg | NASAL | Status: DC | PRN

## 2014-03-10 MED ORDER — POTASSIUM CHLORIDE CRYS ER 20 MEQ PO TBCR: 20.0000 meq | EXTENDED_RELEASE_TABLET | ORAL | Status: DC

## 2014-03-10 NOTE — Progress Notes (Signed)
Pre visit review using our clinic review tool, if applicable. No additional management support is needed unless otherwise documented below in the visit note. 

## 2014-03-10 NOTE — Patient Instructions (Signed)

## 2014-03-11 ENCOUNTER — Encounter: Payer: Self-pay | Admitting: Family Medicine

## 2014-03-11 DIAGNOSIS — E039 Hypothyroidism, unspecified: Secondary | ICD-10-CM | POA: Insufficient documentation

## 2014-03-11 NOTE — Assessment & Plan Note (Signed)
struggles with constant pain, she wonders how long she can work

## 2014-03-11 NOTE — Progress Notes (Signed)
Patient ID: Heather Hanson, female   DOB: Mar 30, 1961, 53 y.o.   MRN: [id number] Heather Hanson [id number] 02-14-1961 03/11/2014      Progress Note-Follow Up  Subjective  Chief Complaint  Chief Complaint  Patient presents with  . Follow-up    3 month    HPI  Patient is a 53 year old female in today for routine medical care. She is tearful during the visit. Continues to struggle with chronic pain and fatigue. She is frustrated regarding her right upper quadrant and right lower back pain she says the pain has been persistent since her gallbladder was removed. She finds work difficult due to the pain. No recent fevers or acute illness at this time. Denies palp/SOB/HA/congestion/fevers/GI or GU c/o. Taking meds as prescribed  Past Medical History  Diagnosis Date  . Anemia     thalassemia  . Hyperlipidemia     not on any meds   . Seizure disorder x 2    presumed secondary to ativan withdrawal -Dr Sharene Skeans  . Bursitis of right shoulder   . PUD (peptic ulcer disease)     Dr Loreta Ave ( H. Pylori)  . History of colonoscopy 2000  . Chronic insomnia   . Obesity   . Sinusitis, acute 04/09/2013    takes Zyrtec daily  . Allergic state 04/09/2013  . H. pylori infection 07/19/2013  . Cholecystitis, acute 08/30/2013  . Constipation     takes Linzess daily prn  . Anxiety     takes Ativan daily prn  . Nausea     takes Phenergan prn  . Muscle spasms of head and/or neck     takes Norflex daily prn  . Depression     takes Effexor daily  . Peripheral edema     takes Lasix prn  . Complication of anesthesia     hard to sedate  . Fibromyalgia   . Shortness of breath     pt states when Fibromyalgia flares up  . History of bronchitis     last time many yrs ago  . Migraines     takes Topamax daily-last migraine today  . Headache(784.0) 05/22/2013  . Arthritis     sees Dr.Beekman  . Lichen     Dr.Fleisher at [facility]  . H/O hiatal hernia   . GERD (gastroesophageal reflux disease) 06/17/2013     takes Dexilant daily prn    Past Surgical History  Procedure Laterality Date  . Tonsillectomy  1972  . Abdominal hysterectomy  2000    partial  . Colonoscopy    . Cholecystectomy N/A 09/16/2013    Procedure: LAPAROSCOPIC CHOLECYSTECTOMY;  Surgeon: Emelia Loron, MD;  Location: [facility] OR;  Service: General;  Laterality: N/A;    Family History  Problem Relation Age of Onset  . Cancer      Breast < 75 yo, ,Prostate <50 yo  . Coronary artery disease      <60 yo  . Diabetes    . Diabetes Mother   . Cancer Father     prostate  . COPD Father     History   Social History  . Marital Status: Divorced    Spouse Name: N/A    Number of Children: 2  . Years of Education: N/A   Occupational History  . 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 Smoking status: Never Smoker   . Smokeless tobacco: Never Used  . Alcohol Use: Yes  Comment: wine rarely  . Drug Use: No  . Sexual Activity: Yes    Birth Control/ Protection: Surgical   Other Topics Concern  . Not on file   Social History Narrative   Occupation: Water quality scientist   Divorced     2 sons 26, 30      Never Smoked     Alcohol use-no       Current Outpatient Prescriptions on File Prior to Visit  Medication Sig Dispense Refill  . cetirizine (ZYRTEC) 10 MG tablet Take 1 tablet (10 mg total) by mouth daily as needed for allergies or rhinitis.  100 tablet  1  . furosemide (LASIX) 20 MG tablet Take 20 mg by mouth daily as needed for edema.      Marland Kitchen LORazepam (ATIVAN) 2 MG tablet TAKE 1 TABLET BY MOUTH EVERY 6 HOURS AS NEEDED FOR ANXIETY  120 tablet  1  . mometasone (NASONEX) 50 MCG/ACT nasal spray Place 2 sprays into the nose daily as needed. Allergies      . NALTREXONE HCL PO Take 4 mg by mouth at bedtime.      . orphenadrine (NORFLEX) 100 MG tablet Take 1 tablet (100 mg total) by mouth 2 (two) times daily as needed.  60 tablet  3  . promethazine (PHENERGAN) 12.5 MG tablet Take 12.5 mg by mouth every 6  (six) hours as needed for nausea.      Marland Kitchen saccharomyces boulardii (FLORASTOR) 250 MG capsule Take 1 capsule (250 mg total) by mouth daily.  30 capsule  3  . topiramate (TOPAMAX) 50 MG tablet TAKE 1 TABLET BY MOUTH 3 TIMES A DAY  90 tablet  2  . triamcinolone cream (KENALOG) 0.5 % Apply 1 application topically 3 (three) times daily.  30 g  5  . venlafaxine (EFFEXOR) 75 MG tablet TAKE 1 TABLET BY MOUTH 3 TIMES A DAY  90 tablet  1   No current facility-administered medications on file prior to visit.    Allergies  Allergen Reactions  . Augmentin [Amoxicillin-Pot Clavulanate] Other (See Comments)    Blisters in the back of throat  . Dexilant [Dexlansoprazole] Other (See Comments)    Bowel urgency and liquid stools  . Hydrocodone-Acetaminophen Hives    REACTION: Rash  . Levsin [Hyoscyamine Sulfate] Other (See Comments)    Projectile vomitting  . Linzess [Linaclotide] Other (See Comments)    Bowel urgency and liquid stools  . Promethazine Nausea And Vomiting  . Propoxyphene N-Acetaminophen Itching    REACTION: rash  . Acetaminophen Itching and Rash  . Latex Itching and Rash    Rash-looks like she has the measles per pt  . Oxycodone-Acetaminophen Itching and Rash    REACTION: Rash    Review of Systems  Review of Systems  Constitutional: Negative for fever and malaise/fatigue.  HENT: Negative for congestion.   Eyes: Negative for discharge.  Respiratory: Negative for shortness of breath.   Cardiovascular: Negative for chest pain, palpitations and leg swelling.  Gastrointestinal: Negative for nausea, abdominal pain and diarrhea.  Genitourinary: Negative for dysuria.  Musculoskeletal: Negative for falls.  Skin: Negative for rash.  Neurological: Negative for loss of consciousness and headaches.  Endo/Heme/Allergies: Negative for polydipsia.  Psychiatric/Behavioral: Positive for depression. Negative for suicidal ideas. The patient is nervous/anxious. The patient does not have insomnia.      Objective  BP 124/72  Pulse 75  Temp(Src) 98.1 F (36.7 C) (Oral)  Ht 5\' 8"  (1.727 m)  Wt 209 lb 1.9 oz (94.856 kg)  BMI 31.80 kg/m2  SpO2 94%  Physical Exam  Physical Exam  Constitutional: She is oriented to person, place, and time and well-developed, well-nourished, and in no distress. No distress.  HENT:  Head: Normocephalic and atraumatic.  Eyes: Conjunctivae are normal.  Neck: Neck supple. No thyromegaly present.  Cardiovascular: Normal rate, regular rhythm and normal heart sounds.   No murmur heard. Pulmonary/Chest: Effort normal and breath sounds normal. She has no wheezes.  Abdominal: She exhibits no distension and no mass.  Musculoskeletal: She exhibits no edema.  Lymphadenopathy:    She has no cervical adenopathy.  Neurological: She is alert and oriented to person, place, and time.  Skin: Skin is warm and dry. No rash noted. She is not diaphoretic.  Psychiatric: Memory, affect and judgment normal.    Lab Results  Component Value Date   TSH 4.732* 03/09/2014   Lab Results  Component Value Date   WBC 4.4 03/09/2014   HGB 12.6 03/09/2014   HCT 39.5 03/09/2014   MCV 69.4* 03/09/2014   PLT 221 03/09/2014   Lab Results  Component Value Date   CREATININE 1.09 03/09/2014   BUN 12 03/09/2014   NA 139 03/09/2014   K 3.9 03/09/2014   CL 106 03/09/2014   CO2 25 03/09/2014   Lab Results  Component Value Date   ALT 10 03/09/2014   AST 16 03/09/2014   ALKPHOS 78 03/09/2014   BILITOT 0.4 03/09/2014   Lab Results  Component Value Date   CHOL 297* 03/09/2014   Lab Results  Component Value Date   HDL 81 03/09/2014   Lab Results  Component Value Date   LDLCALC 200* 03/09/2014   Lab Results  Component Value Date   TRIG 79 03/09/2014   Lab Results  Component Value Date   CHOLHDL 3.7 03/09/2014     Assessment & Plan  ADJUSTMENT DISORDER WITH ANXIETY Patient tearful and labile in office but mostly doing well on these meds  FIBROMYALGIA struggles with  constant pain, she wonders how long she can work  Chronic headaches Has responded to Imitrex in past, given a prescription for the nasal version to try  Unspecified hypothyroidism Star Levothyroxine and retest in 12 weeks  HYPERLIPIDEMIA Declines statins, reports did not tolerate. Encouraged to at least try a vitamin B complex. Encouraged heart healthy diet, increase exercise, avoid trans fats, consider a krill oil cap daily

## 2014-03-11 NOTE — Assessment & Plan Note (Signed)
Patient tearful and labile in office but mostly doing well on these meds

## 2014-03-11 NOTE — Assessment & Plan Note (Signed)
Star Levothyroxine and retest in 12 weeks

## 2014-03-11 NOTE — Assessment & Plan Note (Signed)
Declines statins, reports did not tolerate. Encouraged to at least try a vitamin B complex. Encouraged heart healthy diet, increase exercise, avoid trans fats, consider a krill oil cap daily

## 2014-03-11 NOTE — Assessment & Plan Note (Signed)
Has responded to Imitrex in past, given a prescription for the nasal version to try

## 2014-04-02 ENCOUNTER — Other Ambulatory Visit: Payer: Self-pay | Admitting: Family Medicine

## 2014-04-03 ENCOUNTER — Telehealth: Payer: Self-pay | Admitting: Family Medicine

## 2014-04-03 MED ORDER — LORAZEPAM 2 MG PO TABS: ORAL_TABLET | ORAL | Status: DC

## 2014-04-03 NOTE — Telephone Encounter (Signed)
Spoke with Maxine GlennMonica at [facility]. Gave verbal for lorazepam, #120 x no refills. Advised her we sent  Refill of cetirizine on 01/20/14, #100 x 1 refill. She states the refill did not come through on this rx and she will add it.

## 2014-04-03 NOTE — Telephone Encounter (Signed)
Patient walked in the office stating that she needs a refill of lorazepam and cetirizine. Please send in refills today as she is almost out. Thanks!

## 2014-04-28 ENCOUNTER — Other Ambulatory Visit: Payer: Self-pay | Admitting: Family Medicine

## 2014-04-28 ENCOUNTER — Telehealth: Payer: Self-pay | Admitting: Family Medicine

## 2014-04-28 MED ORDER — LORAZEPAM 2 MG PO TABS: ORAL_TABLET | ORAL | Status: DC

## 2014-04-28 NOTE — Telephone Encounter (Signed)
RX printed for md to sign and fax  Last RX was done on 04-03-14 quantity 120 with 0 refills

## 2014-04-28 NOTE — Telephone Encounter (Signed)
Refill- lorazepam  [facility]

## 2014-05-14 ENCOUNTER — Other Ambulatory Visit: Payer: Self-pay | Admitting: Hematology & Oncology

## 2014-05-14 DIAGNOSIS — H6690 Otitis media, unspecified, unspecified ear: Secondary | ICD-10-CM

## 2014-05-14 MED ORDER — ANTIPYRINE-BENZOCAINE 5.4-1.4 % OT SOLN: 3.0000 [drp] | OTIC | Status: DC | PRN

## 2014-05-29 ENCOUNTER — Other Ambulatory Visit: Payer: Self-pay | Admitting: Family Medicine

## 2014-06-01 NOTE — Telephone Encounter (Signed)
Please advise refills 

## 2014-06-01 NOTE — Telephone Encounter (Signed)
Ok to write prescriptions or icy hot she isrequesting and refill kits

## 2014-06-01 NOTE — Telephone Encounter (Signed)
Also, patient requests a new rx of  icy hot starter smart relief kit $26.55  and she needs an rx for 6 refill kits($8.65)  for this that way she can use her benny card.

## 2014-06-02 NOTE — Telephone Encounter (Signed)
Please assist

## 2014-06-02 NOTE — Telephone Encounter (Signed)
[facility]6 [facility] was called into pharmacy.

## 2014-06-30 ENCOUNTER — Other Ambulatory Visit: Payer: Self-pay | Admitting: Family Medicine

## 2014-07-01 ENCOUNTER — Other Ambulatory Visit: Payer: Self-pay | Admitting: Family Medicine

## 2014-07-02 ENCOUNTER — Telehealth: Payer: Self-pay | Admitting: Family Medicine

## 2014-07-02 ENCOUNTER — Encounter: Payer: Self-pay | Admitting: Family Medicine

## 2014-07-02 DIAGNOSIS — R52 Pain, unspecified: Secondary | ICD-10-CM

## 2014-07-02 DIAGNOSIS — N390 Urinary tract infection, site not specified: Secondary | ICD-10-CM

## 2014-07-03 NOTE — Telephone Encounter (Signed)
Lorazepam sent.  Please advise other mychart questions

## 2014-07-06 ENCOUNTER — Other Ambulatory Visit (HOSPITAL_BASED_OUTPATIENT_CLINIC_OR_DEPARTMENT_OTHER): Payer: 59 | Admitting: Lab

## 2014-07-06 ENCOUNTER — Other Ambulatory Visit: Payer: Self-pay | Admitting: Hematology & Oncology

## 2014-07-06 DIAGNOSIS — E785 Hyperlipidemia, unspecified: Secondary | ICD-10-CM

## 2014-07-06 DIAGNOSIS — D649 Anemia, unspecified: Secondary | ICD-10-CM

## 2014-07-06 LAB — CBC WITH DIFFERENTIAL (CANCER CENTER ONLY)
BASO#: 0 10*3/uL (ref 0.0–0.2)
BASO%: 0.2 % (ref 0.0–2.0)
EOS%: 3.4 % (ref 0.0–7.0)
Eosinophils Absolute: 0.2 10*3/uL (ref 0.0–0.5)
HEMATOCRIT: 36.8 % (ref 34.8–46.6)
HGB: 11.7 g/dL (ref 11.6–15.9)
LYMPH#: 1.4 10*3/uL (ref 0.9–3.3)
LYMPH%: 29.3 % (ref 14.0–48.0)
MCH: 22.9 pg — ABNORMAL LOW (ref 26.0–34.0)
MCHC: 31.8 g/dL — ABNORMAL LOW (ref 32.0–36.0)
MCV: 72 fL — AB (ref 81–101)
MONO#: 0.5 10*3/uL (ref 0.1–0.9)
MONO%: 11 % (ref 0.0–13.0)
NEUT#: 2.6 10*3/uL (ref 1.5–6.5)
NEUT%: 56.1 % (ref 39.6–80.0)
Platelets: 204 10*3/uL (ref 145–400)
RBC: 5.1 10*6/uL (ref 3.70–5.32)
RDW: 14.8 % (ref 11.1–15.7)
WBC: 4.7 10*3/uL (ref 3.9–10.0)

## 2014-07-06 LAB — COMPREHENSIVE METABOLIC PANEL
ALBUMIN: 3.9 g/dL (ref 3.5–5.2)
ALT: 11 U/L (ref 0–35)
AST: 18 U/L (ref 0–37)
Alkaline Phosphatase: 82 U/L (ref 39–117)
BUN: 16 mg/dL (ref 6–23)
CO2: 25 meq/L (ref 19–32)
Calcium: 9 mg/dL (ref 8.4–10.5)
Chloride: 102 mEq/L (ref 96–112)
Creatinine, Ser: 0.93 mg/dL (ref 0.50–1.10)
GLUCOSE: 83 mg/dL (ref 70–99)
POTASSIUM: 3.7 meq/L (ref 3.5–5.3)
SODIUM: 136 meq/L (ref 135–145)
TOTAL PROTEIN: 6.9 g/dL (ref 6.0–8.3)
Total Bilirubin: 0.3 mg/dL (ref 0.2–1.2)

## 2014-07-06 LAB — AMYLASE: Amylase: 63 U/L (ref 0–105)

## 2014-07-06 LAB — LIPASE: LIPASE: 37 U/L (ref 0–75)

## 2014-07-07 LAB — TSH CHCC: TSH: 1.204 m(IU)/L (ref 0.308–3.960)

## 2014-07-21 ENCOUNTER — Ambulatory Visit (INDEPENDENT_AMBULATORY_CARE_PROVIDER_SITE_OTHER): Payer: 59 | Admitting: Family Medicine

## 2014-07-21 ENCOUNTER — Encounter: Payer: Self-pay | Admitting: Family Medicine

## 2014-07-21 VITALS — BP 112/62 | HR 82 | Temp 98.2°F | Ht 68.0 in | Wt 209.0 lb

## 2014-07-21 DIAGNOSIS — M545 Low back pain, unspecified: Secondary | ICD-10-CM

## 2014-07-21 DIAGNOSIS — M25559 Pain in unspecified hip: Secondary | ICD-10-CM

## 2014-07-21 DIAGNOSIS — M25552 Pain in left hip: Secondary | ICD-10-CM

## 2014-07-21 DIAGNOSIS — E669 Obesity, unspecified: Secondary | ICD-10-CM

## 2014-07-21 DIAGNOSIS — D649 Anemia, unspecified: Secondary | ICD-10-CM

## 2014-07-21 DIAGNOSIS — K5900 Constipation, unspecified: Secondary | ICD-10-CM

## 2014-07-21 DIAGNOSIS — F411 Generalized anxiety disorder: Secondary | ICD-10-CM

## 2014-07-21 DIAGNOSIS — E876 Hypokalemia: Secondary | ICD-10-CM

## 2014-07-21 MED ORDER — CYCLOBENZAPRINE HCL 10 MG PO TABS: 10.0000 mg | ORAL_TABLET | Freq: Every evening | ORAL | Status: DC | PRN

## 2014-07-21 MED ORDER — POTASSIUM CHLORIDE CRYS ER 20 MEQ PO TBCR: 20.0000 meq | EXTENDED_RELEASE_TABLET | ORAL | Status: DC

## 2014-07-21 MED ORDER — LORAZEPAM 2 MG PO TABS: 2.0000 mg | ORAL_TABLET | Freq: Four times a day (QID) | ORAL | Status: DC | PRN

## 2014-07-21 MED ORDER — ORPHENADRINE CITRATE ER 100 MG PO TB12: 100.0000 mg | ORAL_TABLET | Freq: Two times a day (BID) | ORAL | Status: DC | PRN

## 2014-07-21 NOTE — Patient Instructions (Signed)
Hip Pain Your hip is the joint between your upper legs and your lower pelvis. The bones, cartilage, tendons, and muscles of your hip joint perform a lot of work each day supporting your body weight and allowing you to move around. Hip pain can range from a minor ache to severe pain in one or both of your hips. Pain may be felt on the inside of the hip joint near the groin, or the outside near the buttocks and upper thigh. You may have swelling or stiffness as well.  HOME CARE INSTRUCTIONS   Take medicines only as directed by your health care provider.  Apply ice to the injured area:  Put ice in a plastic bag.  Place a towel between your skin and the bag.  Leave the ice on for 15-20 minutes at a time, 3-4 times a day.  Keep your leg raised (elevated) when possible to lessen swelling.  Avoid activities that cause pain.  Follow specific exercises as directed by your health care provider.  Sleep with a pillow between your legs on your most comfortable side.  Record how often you have hip pain, the location of the pain, and what it feels like. SEEK MEDICAL CARE IF:   You are unable to put weight on your leg.  Your hip is red or swollen or very tender to touch.  Your pain or swelling continues or worsens after 1 week.  You have increasing difficulty walking.  You have a fever. SEEK IMMEDIATE MEDICAL CARE IF:   You have fallen.  You have a sudden increase in pain and swelling in your hip. MAKE SURE YOU:   Understand these instructions.  Will watch your condition.  Will get help right away if you are not doing well or get worse. Document Released: 05/17/2010 Document Revised: 04/13/2014 Document Reviewed: 07/24/2013 ExitCare Patient Information 2015 [employer]. This information is not intended to replace advice given to you by your health care provider. Make sure you discuss any questions you have with your health care provider.  

## 2014-07-21 NOTE — Progress Notes (Signed)
Pre visit review using our clinic review tool, if applicable. No additional management support is needed unless otherwise documented below in the visit note. 

## 2014-07-26 NOTE — Assessment & Plan Note (Addendum)
Encouraged increased hydration and fiber in diet. Daily probiotics. If bowels not moving can use MOM 2 tbls po in 4 oz of warm prune juice by mouth every 2-3 days.  

## 2014-07-26 NOTE — Assessment & Plan Note (Signed)
Encouraged DASH diet, decrease po intake and increase exercise as tolerated. Needs 7-8 hours of sleep nightly. Avoid trans fats, eat small, frequent meals every 4-5 hours with lean proteins, complex carbs and healthy fats. Minimize simple carbs, GMO foods. 

## 2014-07-26 NOTE — Progress Notes (Signed)
Patient ID: Heather Hanson, female   DOB: 08-31-1961, 53 y.o.   MRN: [id number] Heather Hanson [id number] 11-02-1961 07/26/2014      Progress Note-Follow Up  Subjective  Chief Complaint  Chief Complaint  Patient presents with  . Follow-up    HPI  Patient is a 53 year old female in today for routine medical care. In today complaining of some worsening left posterior hip pain. She's having trouble getting comfortable most notably at night. She denies any recent injury or trauma. She has pain really just in the hip and the very upper thigh no radicular symptoms down the leg. No incontinence. Otherwise she continues to struggle with fatigue and high levels of stress but has had no other acute recent illness. Denies CP/palp/SOB/HA/congestion/fevers/GI or GU c/o. Taking meds as prescribed  Past Medical History  Diagnosis Date  . Anemia     thalassemia  . Hyperlipidemia     not on any meds   . Seizure disorder x 2    presumed secondary to ativan withdrawal -Dr Sharene Skeans  . Bursitis of right shoulder   . PUD (peptic ulcer disease)     Dr Loreta Ave ( H. Pylori)  . History of colonoscopy 2000  . Chronic insomnia   . Obesity   . Sinusitis, acute 04/09/2013    takes Zyrtec daily  . Allergic state 04/09/2013  . H. pylori infection 07/19/2013  . Cholecystitis, acute 08/30/2013  . Constipation     takes Linzess daily prn  . Anxiety     takes Ativan daily prn  . Nausea     takes Phenergan prn  . Muscle spasms of head and/or neck     takes Norflex daily prn  . Depression     takes Effexor daily  . Peripheral edema     takes Lasix prn  . Complication of anesthesia     hard to sedate  . Fibromyalgia   . Shortness of breath     pt states when Fibromyalgia flares up  . History of bronchitis     last time many yrs ago  . Migraines     takes Topamax daily-last migraine today  . Headache(784.0) 05/22/2013  . Arthritis     sees Dr.Beekman  . Lichen     Dr[facility]Fleisher at [facility]  . H/O  hiatal hernia   . GERD (gastroesophageal reflux disease) 06/17/2013    takes Dexilant daily prn    Past Surgical History  Procedure Laterality Date  . Tonsillectomy  1972  . Abdominal hysterectomy  2000    partial  . Colonoscopy    . Cholecystectomy N/A 09/16/2013    Procedure: LAPAROSCOPIC CHOLECYSTECTOMY;  Surgeon: Emelia Loron, MD;  Location: [facility] OR;  Service: General;  Laterality: N/A;    Family History  Problem Relation Age of Onset  . Cancer      Breast < 55 yo, ,Prostate <50 yo  . Coronary artery disease      <60 yo  . Diabetes    . Diabetes Mother   . Cancer Father     prostate  . COPD Father     History   Social History  . Marital Status: Divorced    Spouse Name: N/A    Number of Children: 2  . Years of Education: N/A   Occupational History  . 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 Smoking status: Never Smoker   . Smokeless tobacco: Never Used  .  Alcohol Use: Yes     Comment: wine rarely  . Drug Use: No  . Sexual Activity: Yes    Birth Control/ Protection: Surgical   Other Topics Concern  . Not on file   Social History Narrative   Occupation: Water quality scientisthlebotomist   Divorced     2 sons 26, 2333      Never Smoked     Alcohol use-no       Current Outpatient Prescriptions on File Prior to Visit  Medication Sig Dispense Refill  . antipyrine-benzocaine (AURALGAN) otic solution Place 3-4 drops into both ears every 2 (two) hours as needed for ear pain.  10 mL  0  . b complex vitamins tablet Take 1 tablet by mouth daily.  30 tablet  5  . cetirizine (ZYRTEC) 10 MG tablet Take 1 tablet (10 mg total) by mouth daily as needed for allergies or rhinitis.  100 tablet  1  . furosemide (LASIX) 20 MG tablet TAKE 1 TABLET (20 MG TOTAL) BY MOUTH DAILY AS NEEDED.  30 tablet  5  . levothyroxine (SYNTHROID, LEVOTHROID) 25 MCG tablet TAKE 1 TABLET BY MOUTH DAILY BEFORE BREAKFAST  30 tablet  3  . mometasone (NASONEX) 50 MCG/ACT nasal spray Place 2 sprays  into the nose daily as needed. Allergies      . NALTREXONE HCL PO Take 4 mg by mouth at bedtime.      . NON FORMULARY ICY HOT SMART RELIEF      . SUMAtriptan (IMITREX) 20 MG/ACT nasal spray Place 1 spray (20 mg total) into the nose every 2 (two) hours as needed for migraine or headache. Max of 2 doses in 24 hours  1 Inhaler  3  . topiramate (TOPAMAX) 50 MG tablet TAKE 1 TABLET BY MOUTH 3 TIMES A DAY  90 tablet  2  . triamcinolone cream (KENALOG) 0.5 % Apply 1 application topically 3 (three) times daily.  30 g  5  . venlafaxine (EFFEXOR) 75 MG tablet TAKE 1 TABLET BY MOUTH 3 TIMES A DAY  90 tablet  1   No current facility-administered medications on file prior to visit.    Allergies  Allergen Reactions  . Augmentin [Amoxicillin-Pot Clavulanate] Other (See Comments)    Blisters in the back of throat  . Dexilant [Dexlansoprazole] Other (See Comments)    Bowel urgency and liquid stools  . Hydrocodone-Acetaminophen Hives    REACTION: Rash  . Levsin [Hyoscyamine Sulfate] Other (See Comments)    Projectile vomitting  . Linzess [Linaclotide] Other (See Comments)    Bowel urgency and liquid stools  . Promethazine Nausea And Vomiting  . Propoxyphene N-Acetaminophen Itching    REACTION: rash  . Acetaminophen Itching and Rash  . Latex Itching and Rash    Rash-looks like she has the measles per pt  . Oxycodone-Acetaminophen Itching and Rash    REACTION: Rash    Review of Systems  Review of Systems  Constitutional: Positive for malaise/fatigue. Negative for fever.  HENT: Negative for congestion.   Eyes: Negative for discharge.  Respiratory: Negative for shortness of breath.   Cardiovascular: Negative for chest pain, palpitations and leg swelling.  Gastrointestinal: Negative for nausea, abdominal pain and diarrhea.  Genitourinary: Negative for dysuria.  Musculoskeletal: Positive for back pain and joint pain. Negative for falls.       Left posterior hip pain  Skin: Negative for rash.   Neurological: Positive for headaches. Negative for loss of consciousness.  Endo/Heme/Allergies: Negative for polydipsia.  Psychiatric/Behavioral: Negative for  depression and suicidal ideas. The patient is nervous/anxious. The patient does not have insomnia.     Objective  BP 112/62  Pulse 82  Temp(Src) 98.2 F (36.8 C) (Oral)  Ht 5\' 8"  (1.727 m)  Wt 209 lb (94.802 kg)  BMI 31.79 kg/m2  SpO2 97%  Physical Exam  Physical Exam  Constitutional: She is oriented to person, place, and time and well-developed, well-nourished, and in no distress. No distress.  HENT:  Head: Normocephalic and atraumatic.  Eyes: Conjunctivae are normal.  Neck: Neck supple. No thyromegaly present.  Cardiovascular: Normal rate, regular rhythm and normal heart sounds.   No murmur heard. Pulmonary/Chest: Effort normal and breath sounds normal. She has no wheezes.  Abdominal: She exhibits no distension and no mass.  Musculoskeletal: She exhibits no edema.  Lymphadenopathy:    She has no cervical adenopathy.  Neurological: She is alert and oriented to person, place, and time.  Skin: Skin is warm and dry. No rash noted. She is not diaphoretic.  Psychiatric: Memory, affect and judgment normal.    Lab Results  Component Value Date   TSH 1.204 07/06/2014   Lab Results  Component Value Date   WBC 4.7 07/06/2014   HGB 11.7 07/06/2014   HCT 36.8 07/06/2014   MCV 72* 07/06/2014   PLT 204 07/06/2014   Lab Results  Component Value Date   CREATININE 0.93 07/06/2014   BUN 16 07/06/2014   NA 136 07/06/2014   K 3.7 07/06/2014   CL 102 07/06/2014   CO2 25 07/06/2014   Lab Results  Component Value Date   ALT 11 07/06/2014   AST 18 07/06/2014   ALKPHOS 82 07/06/2014   BILITOT 0.3 07/06/2014   Lab Results  Component Value Date   CHOL 297* 03/09/2014   Lab Results  Component Value Date   HDL 81 03/09/2014   Lab Results  Component Value Date   LDLCALC 200* 03/09/2014   Lab Results  Component Value Date   TRIG  79 03/09/2014   Lab Results  Component Value Date   CHOLHDL 3.7 03/09/2014     Assessment & Plan  CONSTIPATION Encouraged increased hydration and fiber in diet. Daily probiotics. If bowels not moving can use MOM 2 tbls po in 4 oz of warm prune juice by mouth every 2-3 days  ANEMIA-NOS resolved  Back pain Increased posterior left hip pain, may add a dose of Cyclobenzaprine qhs and may continue Norflex during the day.   OBESITY Encouraged DASH diet, decrease po intake and increase exercise as tolerated. Needs 7-8 hours of sleep nightly. Avoid trans fats, eat small, frequent meals every 4-5 hours with lean proteins, complex carbs and healthy fats. Minimize simple carbs, GMO foods.

## 2014-07-26 NOTE — Assessment & Plan Note (Signed)
resolved 

## 2014-07-26 NOTE — Assessment & Plan Note (Signed)
Increased posterior left hip pain, may add a dose of Cyclobenzaprine qhs and may continue Norflex during the day.

## 2014-07-28 ENCOUNTER — Ambulatory Visit: Payer: 59 | Admitting: Family Medicine

## 2014-08-14 ENCOUNTER — Other Ambulatory Visit: Payer: Self-pay | Admitting: Hematology & Oncology

## 2014-08-14 DIAGNOSIS — L439 Lichen planus, unspecified: Secondary | ICD-10-CM

## 2014-08-14 MED ORDER — CLOBETASOL PROPIONATE 0.05 % EX CREA: 1.0000 "application " | TOPICAL_CREAM | Freq: Two times a day (BID) | CUTANEOUS | Status: DC

## 2014-08-31 ENCOUNTER — Other Ambulatory Visit: Payer: Self-pay | Admitting: Family Medicine

## 2014-09-01 ENCOUNTER — Encounter: Payer: Self-pay | Admitting: Family Medicine

## 2014-09-01 ENCOUNTER — Other Ambulatory Visit: Payer: Self-pay | Admitting: Family Medicine

## 2014-09-01 DIAGNOSIS — M25552 Pain in left hip: Secondary | ICD-10-CM

## 2014-09-01 MED ORDER — CYCLOBENZAPRINE HCL 10 MG PO TABS: 10.0000 mg | ORAL_TABLET | Freq: Two times a day (BID) | ORAL | Status: DC | PRN

## 2014-09-29 ENCOUNTER — Ambulatory Visit: Payer: 59 | Admitting: Family Medicine

## 2014-09-30 ENCOUNTER — Other Ambulatory Visit: Payer: Self-pay | Admitting: Family Medicine

## 2014-10-01 NOTE — Telephone Encounter (Signed)
Last Flexeril RX was done on 09-01-14 quantity 60 with 0 refills  Last Imitrex refill was 03-10-14 quantity 1 with 3 refills  RX's sent

## 2014-10-13 ENCOUNTER — Encounter: Payer: Self-pay | Admitting: Family Medicine

## 2014-10-13 ENCOUNTER — Ambulatory Visit (INDEPENDENT_AMBULATORY_CARE_PROVIDER_SITE_OTHER): Payer: 59 | Admitting: Family Medicine

## 2014-10-13 VITALS — BP 146/88 | HR 83 | Temp 98.2°F | Ht 68.0 in | Wt 215.6 lb

## 2014-10-13 DIAGNOSIS — K219 Gastro-esophageal reflux disease without esophagitis: Secondary | ICD-10-CM

## 2014-10-13 DIAGNOSIS — R51 Headache: Secondary | ICD-10-CM

## 2014-10-13 DIAGNOSIS — E669 Obesity, unspecified: Secondary | ICD-10-CM

## 2014-10-13 DIAGNOSIS — L241 Irritant contact dermatitis due to oils and greases: Secondary | ICD-10-CM

## 2014-10-13 DIAGNOSIS — K5900 Constipation, unspecified: Secondary | ICD-10-CM

## 2014-10-13 DIAGNOSIS — E039 Hypothyroidism, unspecified: Secondary | ICD-10-CM

## 2014-10-13 DIAGNOSIS — R519 Headache, unspecified: Secondary | ICD-10-CM

## 2014-10-13 MED ORDER — TRIAMCINOLONE ACETONIDE 0.5 % EX CREA: 1.0000 "application " | TOPICAL_CREAM | Freq: Three times a day (TID) | CUTANEOUS | Status: DC

## 2014-10-13 MED ORDER — TOPIRAMATE 50 MG PO TABS: ORAL_TABLET | ORAL | Status: DC

## 2014-10-13 MED ORDER — SUMATRIPTAN SUCCINATE 100 MG PO TABS: 50.0000 mg | ORAL_TABLET | ORAL | Status: DC | PRN

## 2014-10-13 MED ORDER — FUROSEMIDE 20 MG PO TABS: ORAL_TABLET | ORAL | Status: DC

## 2014-10-13 NOTE — Progress Notes (Signed)
Pre visit review using our clinic review tool, if applicable. No additional management support is needed unless otherwise documented below in the visit note. 

## 2014-10-13 NOTE — Patient Instructions (Signed)

## 2014-10-13 NOTE — Progress Notes (Signed)
Patient ID: Heather Hanson, female   DOB: [birth year], 53 y.o.   MRN: [id number] Heather Hanson [id number] [birth year] 10/13/2014      Progress Note-Follow Up  Subjective  Chief Complaint  Chief Complaint  Patient presents with  . Follow-up    2 month    HPI  Patient is a 53 year old female in today for routine medical care.  She is noting a stressful day and a daylong headache, no significant photophobia or phonophobia but the HA is generalized. Took flu shot at work on 09/24/2014. Denies CP/palp/SOB/HA/congestion/fevers/GI or GU c/o. Taking meds as prescribed. Continues to struggle with diffuse arthralgias and myalgias  Past Medical History  Diagnosis Date  . Anemia     thalassemia  . Hyperlipidemia     not on any meds   . Seizure disorder x 2    presumed secondary to ativan withdrawal -Dr Sharene SkeansHickling  . Bursitis of right shoulder   . PUD (peptic ulcer disease)     Dr Loreta AveMann ( H. Pylori)  . History of colonoscopy 2000  . Chronic insomnia   . Obesity   . Sinusitis, acute 04/09/2013    takes Zyrtec daily  . Allergic state 04/09/2013  . H. pylori infection 07/19/2013  . Cholecystitis, acute 08/30/2013  . Constipation     takes Linzess daily prn  . Anxiety     takes Ativan daily prn  . Nausea     takes Phenergan prn  . Muscle spasms of head and/or neck     takes Norflex daily prn  . Depression     takes Effexor daily  . Peripheral edema     takes Lasix prn  . Complication of anesthesia     hard to sedate  . Fibromyalgia   . Shortness of breath     pt states when Fibromyalgia flares up  . History of bronchitis     last time many yrs ago  . Migraines     takes Topamax daily-last migraine today  . Headache(784.0) 05/22/2013  . Arthritis     sees Dr.Beekman  . Lichen     [facility]  . H/O hiatal hernia   . GERD (gastroesophageal reflux disease) 06/17/2013    takes Dexilant daily prn    Past Surgical History  Procedure Laterality Date  . Tonsillectomy   1972  . Abdominal hysterectomy  2000    partial  . Colonoscopy    . Cholecystectomy N/A 09/16/2013    Procedure: LAPAROSCOPIC CHOLECYSTECTOMY;  Surgeon: Emelia LoronMatthew Wakefield, MD;  [facility];  Service: General;  Laterality: N/A;    Family History  Problem Relation Age of Onset  . Cancer      Breast < 55 yo, ,Prostate <50 yo  . Coronary artery disease      <60 yo  . Diabetes    . Diabetes Mother   . Cancer Father     prostate  . COPD Father     History   Social History  . Marital Status: Divorced    Spouse Name: N/A    Number of Children: 2  . Years of Education: N/A   Occupational History  . 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 Smoking status: Never Smoker   . Smokeless tobacco: Never Used  . Alcohol Use: Yes     Comment: wine rarely  . Drug Use: No  . Sexual Activity: Yes    Birth Control/ Protection:  Surgical   Other Topics Concern  . Not on file   Social History Narrative   Occupation: Water quality scientist   Divorced     2 sons 26, 25      Never Smoked     Alcohol use-no       Current Outpatient Prescriptions on File Prior to Visit  Medication Sig Dispense Refill  . antipyrine-benzocaine (AURALGAN) otic solution Place 3-4 drops into both ears every 2 (two) hours as needed for ear pain. 10 mL 0  . cetirizine (ZYRTEC) 10 MG tablet Take 1 tablet (10 mg total) by mouth daily as needed for allergies or rhinitis. 100 tablet 1  . clobetasol cream (TEMOVATE) 0.05 % Apply 1 application topically 2 (two) times daily. 30 g 6  . cyclobenzaprine (FLEXERIL) 10 MG tablet TAKE 1 TABLET (10 MG TOTAL) BY MOUTH 2 (TWO) TIMES DAILY AS NEEDED FOR MUSCLE SPASMS. 60 tablet 0  . levothyroxine (SYNTHROID, LEVOTHROID) 25 MCG tablet TAKE 1 TABLET BY MOUTH DAILY BEFORE BREAKFAST 30 tablet 3  . LORazepam (ATIVAN) 2 MG tablet Take 1 tablet (2 mg total) by mouth every 6 (six) hours as needed for anxiety. OK to fill after 07/01/14 120 tablet 3  . mometasone (NASONEX) 50  MCG/ACT nasal spray Place 2 sprays into the nose daily as needed. Allergies    . NALTREXONE HCL PO Take 4 mg by mouth at bedtime.    . NON FORMULARY ICY HOT SMART RELIEF    . orphenadrine (NORFLEX) 100 MG tablet Take 1 tablet (100 mg total) by mouth 2 (two) times daily as needed for muscle spasms or mild pain (during the day). 60 tablet 3  . potassium chloride SA (K-DUR,KLOR-CON) 20 MEQ tablet Take 1 tablet (20 mEq total) by mouth as directed. 30 tablet 3  . venlafaxine (EFFEXOR) 75 MG tablet TAKE 1 TABLET BY MOUTH 3 TIMES A DAY 90 tablet 1  . SUMAtriptan (IMITREX) 20 MG/ACT nasal spray PLACE 1 SPRAY (20 MG TOTAL) INTO THE NOSE EVERY 2 (TWO) HOURS AS NEEDED FOR MIGRAINE OR HEADACHE. MAX OF 2 DOSES IN 24 HOURS 1 Inhaler 1   No current facility-administered medications on file prior to visit.    Allergies  Allergen Reactions  . Augmentin [Amoxicillin-Pot Clavulanate] Other (See Comments)    Blisters in the back of throat  . Dexilant [Dexlansoprazole] Other (See Comments)    Bowel urgency and liquid stools  . Hydrocodone-Acetaminophen Hives    REACTION: Rash  . Levsin [Hyoscyamine Sulfate] Other (See Comments)    Projectile vomitting  . Linzess [Linaclotide] Other (See Comments)    Bowel urgency and liquid stools  . Promethazine Nausea And Vomiting  . Propoxyphene N-Acetaminophen Itching    REACTION: rash  . Acetaminophen Itching and Rash  . Latex Itching and Rash    Rash-looks like she has the measles per pt  . Oxycodone-Acetaminophen Itching and Rash    REACTION: Rash    Review of Systems  Review of Systems  Constitutional: Negative for fever and malaise/fatigue.  HENT: Negative for congestion.   Eyes: Negative for discharge.  Respiratory: Negative for shortness of breath.   Cardiovascular: Negative for chest pain, palpitations and leg swelling.  Gastrointestinal: Negative for nausea, abdominal pain and diarrhea.  Genitourinary: Negative for dysuria.  Musculoskeletal:  Negative for falls.  Skin: Negative for rash.  Neurological: Positive for headaches. Negative for loss of consciousness.  Endo/Heme/Allergies: Negative for polydipsia.  Psychiatric/Behavioral: Negative for depression and suicidal ideas. The patient is not nervous/anxious and  does not have insomnia.     Objective  BP 153/93 mmHg  Pulse 83  Temp(Src) 98.2 F (36.8 C) (Oral)  Ht 5\' 8"  (1.727 m)  Wt 215 lb 9.6 oz (97.796 kg)  BMI 32.79 kg/m2  SpO2 100%  Physical Exam  Physical Exam  Constitutional: She is oriented to person, place, and time and well-developed, well-nourished, and in no distress. No distress.  HENT:  Head: Normocephalic and atraumatic.  Eyes: Conjunctivae are normal.  Neck: Neck supple. No thyromegaly present.  Cardiovascular: Normal rate, regular rhythm and normal heart sounds.   No murmur heard. Pulmonary/Chest: Effort normal and breath sounds normal. She has no wheezes.  Abdominal: She exhibits no distension and no mass.  Musculoskeletal: She exhibits no edema.  Lymphadenopathy:    She has no cervical adenopathy.  Neurological: She is alert and oriented to person, place, and time.  Skin: Skin is warm and dry. No rash noted. She is not diaphoretic.  Psychiatric: Memory, affect and judgment normal.    Lab Results  Component Value Date   TSH 1.204 07/06/2014   Lab Results  Component Value Date   WBC 4.7 07/06/2014   HGB 11.7 07/06/2014   HCT 36.8 07/06/2014   MCV 72* 07/06/2014   PLT 204 07/06/2014   Lab Results  Component Value Date   CREATININE 0.93 07/06/2014   BUN 16 07/06/2014   NA 136 07/06/2014   K 3.7 07/06/2014   CL 102 07/06/2014   CO2 25 07/06/2014   Lab Results  Component Value Date   ALT 11 07/06/2014   AST 18 07/06/2014   ALKPHOS 82 07/06/2014   BILITOT 0.3 07/06/2014   Lab Results  Component Value Date   CHOL 297* 03/09/2014   Lab Results  Component Value Date   HDL 81 03/09/2014   Lab Results  Component Value Date    LDLCALC 200* 03/09/2014   Lab Results  Component Value Date   TRIG 79 03/09/2014   Lab Results  Component Value Date   CHOLHDL 3.7 03/09/2014    Assessment & Plan  Constipation Encouraged increased hydration and fiber in diet. Daily probiotics. If bowels not moving can use MOM 2 tbls po in 4 oz of warm prune juice by mouth every 2-3 days. If no results then repeat in 4 hours with  Dulcolax suppository pr, may repeat again in 4 more hours as needed. Seek care if symptoms worsen. Consider daily Miralax and/or Dulcolax if symptoms persist.   Hypothyroidism On Levothyroxine, continue to monitor  Chronic headaches Notes HA today with hi stress and hi bp. Encouraged increased hydration, 64 ounces of clear fluids daily. Minimize alcohol and caffeine. Eat small frequent meals with lean proteins and complex carbs. Avoid high and low blood sugars. Get adequate sleep, 7-8 hours a night. Needs exercise daily preferably in the morning.  GERD (gastroesophageal reflux disease) Avoid offending foods, start probiotics. Do not eat large meals in late evening and consider raising head of bed.   Obesity Encouraged DASH diet, decrease po intake and increase exercise as tolerated. Needs 7-8 hours of sleep nightly. Avoid trans fats, eat small, frequent meals every 4-5 hours with lean proteins, complex carbs and healthy fats. Minimize simple carbs, GMO foods.

## 2014-10-18 NOTE — Assessment & Plan Note (Signed)
On Levothyroxine, continue to monitor 

## 2014-10-18 NOTE — Assessment & Plan Note (Signed)
Encouraged increased hydration and fiber in diet. Daily probiotics. If bowels not moving can use MOM 2 tbls po in 4 oz of warm prune juice by mouth every 2-3 days. If no results then repeat in 4 hours with  Dulcolax suppository pr, may repeat again in 4 more hours as needed. Seek care if symptoms worsen. Consider daily Miralax and/or Dulcolax if symptoms persist.  

## 2014-10-18 NOTE — Assessment & Plan Note (Signed)
Notes HA today with hi stress and hi bp. Encouraged increased hydration, 64 ounces of clear fluids daily. Minimize alcohol and caffeine. Eat small frequent meals with lean proteins and complex carbs. Avoid high and low blood sugars. Get adequate sleep, 7-8 hours a night. Needs exercise daily preferably in the morning.

## 2014-10-18 NOTE — Assessment & Plan Note (Signed)
Avoid offending foods, start probiotics. Do not eat large meals in late evening and consider raising head of bed.  

## 2014-10-18 NOTE — Assessment & Plan Note (Signed)
Encouraged DASH diet, decrease po intake and increase exercise as tolerated. Needs 7-8 hours of sleep nightly. Avoid trans fats, eat small, frequent meals every 4-5 hours with lean proteins, complex carbs and healthy fats. Minimize simple carbs, GMO foods. 

## 2014-11-02 ENCOUNTER — Other Ambulatory Visit: Payer: Self-pay | Admitting: Family Medicine

## 2014-11-02 NOTE — Telephone Encounter (Signed)
Medication Detail      Disp Refills Start End     cyclobenzaprine (FLEXERIL) 10 MG tablet 60 tablet 0 10/01/2014     Sig: TAKE 1 TABLET (10 MG TOTAL) BY MOUTH 2 (TWO) TIMES DAILY AS NEEDED FOR MUSCLE SPASMS.    E-Prescribing Status: Receipt confirmed by pharmacy (10/01/2014 8:59 AM EDT)    Follow-up and Disposition  10/13/2014 AVS   Return in about 3 months (around 01/13/2015) for annual exam.   Please Advise on refills/SLS

## 2014-11-27 ENCOUNTER — Other Ambulatory Visit: Payer: Self-pay | Admitting: Hematology & Oncology

## 2014-11-27 DIAGNOSIS — H6500 Acute serous otitis media, unspecified ear: Secondary | ICD-10-CM

## 2014-11-27 MED ORDER — ANTIPYRINE-BENZOCAINE 5.4-1.4 % OT SOLN: 3.0000 [drp] | OTIC | Status: DC | PRN

## 2014-12-03 ENCOUNTER — Telehealth: Payer: Self-pay | Admitting: Family Medicine

## 2014-12-03 DIAGNOSIS — F411 Generalized anxiety disorder: Secondary | ICD-10-CM

## 2014-12-03 MED ORDER — LORAZEPAM 2 MG PO TABS: 2.0000 mg | ORAL_TABLET | Freq: Four times a day (QID) | ORAL | Status: DC | PRN

## 2014-12-03 NOTE — Telephone Encounter (Signed)
Requesting:LORazepam (ATIVAN) 2 MG tablet Contract on file  UDS not on file Last OV 10/13/2014 Last Refill 11/02/2014 #120  Please Advise

## 2014-12-03 NOTE — Telephone Encounter (Signed)
OK to refill Lorazepam, same sig, same number

## 2014-12-03 NOTE — Telephone Encounter (Signed)
Rx printed and awaiting sig Fax to [facility]

## 2014-12-03 NOTE — Telephone Encounter (Signed)
Needs a refill of lorazepam to be sent to [facility]. Needs today.

## 2014-12-30 ENCOUNTER — Telehealth: Payer: Self-pay | Admitting: Family Medicine

## 2014-12-30 DIAGNOSIS — M25552 Pain in left hip: Secondary | ICD-10-CM

## 2014-12-31 NOTE — Telephone Encounter (Signed)
Requesting Norflex 100mg -Take 1 tablet by mouth 2 times a daily as needed for muscle spasms.                    Flexeril 10mg -Take 1 tablet by mouth 2 times a daily as needed for muscle spasms. Last refill:07/21/14;#60,3                11/02/14;#60,1 Last OV:10/13/14 Please advise.//A/CMA

## 2015-01-05 MED ORDER — ORPHENADRINE CITRATE ER 100 MG PO TB12: ORAL_TABLET | ORAL | Status: DC

## 2015-01-05 MED ORDER — CYCLOBENZAPRINE HCL 10 MG PO TABS: ORAL_TABLET | ORAL | Status: DC

## 2015-01-05 NOTE — Addendum Note (Signed)
Addended by: Verdie ShireBAYNES, Javae Braaten M on: 01/05/2015 05:29 PM   Modules accepted: Orders

## 2015-01-05 NOTE — Telephone Encounter (Signed)
Please advise on the two muscle relaxer listed below.

## 2015-01-05 NOTE — Telephone Encounter (Signed)
Called and spoke with the pt and asked her if she was taking both the Flexeril and Norflex.  Pt stated that she is taking both.  She takes the Norflex in the am because her hands shake, and she takes the Flexeril at night for the muscle spasms.   She said the Flexeril makes her sleepy.  Informed the pt that Dr. Abner GreenspanBlyth normally don't prescribe both of these meds,but she's going to approve them but the pt needs to make sure that she take them 6 hours a part.  Rx's sent to the pharmacy by e-script.//AB/CMA

## 2015-01-05 NOTE — Telephone Encounter (Signed)
Pt came by office to find out what was going on with the refills listed below.  Please advise patient.

## 2015-01-11 ENCOUNTER — Other Ambulatory Visit: Payer: Self-pay | Admitting: Hematology & Oncology

## 2015-01-11 DIAGNOSIS — R053 Chronic cough: Secondary | ICD-10-CM

## 2015-01-11 DIAGNOSIS — R05 Cough: Secondary | ICD-10-CM

## 2015-01-11 MED ORDER — OSELTAMIVIR PHOSPHATE 75 MG PO CAPS: 75.0000 mg | ORAL_CAPSULE | Freq: Every day | ORAL | Status: DC

## 2015-01-11 MED ORDER — HYDROCOD POLST-CHLORPHEN POLST 10-8 MG/5ML PO LQCR: 5.0000 mL | Freq: Two times a day (BID) | ORAL | Status: DC | PRN

## 2015-01-21 ENCOUNTER — Telehealth: Payer: Self-pay | Admitting: *Deleted

## 2015-01-21 NOTE — Telephone Encounter (Signed)
Received FMLA paperwork via fax from [employer]. I called and left message for patient to clarify what paperwork is needed for. I asked patient to please call back with this information. JG//  CMA

## 2015-02-01 ENCOUNTER — Other Ambulatory Visit: Payer: Self-pay | Admitting: Family Medicine

## 2015-02-09 ENCOUNTER — Encounter: Payer: Self-pay | Admitting: Family Medicine

## 2015-02-10 DIAGNOSIS — Z7689 Persons encountering health services in other specified circumstances: Secondary | ICD-10-CM

## 2015-02-10 NOTE — Telephone Encounter (Signed)
Completed forms faxed to [employer] successfully. JG//CMA  

## 2015-02-24 ENCOUNTER — Other Ambulatory Visit: Payer: Self-pay | Admitting: Family Medicine

## 2015-02-24 NOTE — Telephone Encounter (Signed)
Requesting Flexeril 10mg -Take 1 tablet by mouth at bedtime as needed for muscle spasms                    Norflex 100mg -Take 1 tablet by mouth every morning as needed. Last refill:01-05-15;#30,1                01-05-15:#30,1 Last OV:10-13-14 Please advise.//AB/CMA                        Rx for Levothyroxine was filled and sent to the pharmacy by e-script.//AB/CMA

## 2015-03-01 ENCOUNTER — Encounter: Payer: Self-pay | Admitting: Family Medicine

## 2015-03-02 ENCOUNTER — Telehealth: Payer: Self-pay | Admitting: *Deleted

## 2015-03-02 NOTE — Telephone Encounter (Signed)
Called patient regarding MyChart message.  Patient states she is still having a very hard time.  She has been talking with her pastor and states she is feeling "a little better today."  She denies current suicidal ideation.  She speaks very quietly sounds to be crying.  Appointment scheduled for 03/04/15.  Reassured patient that we were here to help and if she needed anything before appointment to call office, and if she was feeling suicidal to go to ED and seek help.  Patient stated understanding and agreed.

## 2015-03-04 ENCOUNTER — Encounter: Payer: Self-pay | Admitting: Family Medicine

## 2015-03-04 ENCOUNTER — Ambulatory Visit (INDEPENDENT_AMBULATORY_CARE_PROVIDER_SITE_OTHER): Payer: 59 | Admitting: Family Medicine

## 2015-03-04 VITALS — BP 120/76 | HR 91 | Temp 98.1°F | Resp 16 | Wt 211.5 lb

## 2015-03-04 DIAGNOSIS — E876 Hypokalemia: Secondary | ICD-10-CM | POA: Diagnosis not present

## 2015-03-04 DIAGNOSIS — F332 Major depressive disorder, recurrent severe without psychotic features: Secondary | ICD-10-CM

## 2015-03-04 DIAGNOSIS — K219 Gastro-esophageal reflux disease without esophagitis: Secondary | ICD-10-CM

## 2015-03-04 DIAGNOSIS — E039 Hypothyroidism, unspecified: Secondary | ICD-10-CM

## 2015-03-04 DIAGNOSIS — E669 Obesity, unspecified: Secondary | ICD-10-CM

## 2015-03-04 DIAGNOSIS — E782 Mixed hyperlipidemia: Secondary | ICD-10-CM

## 2015-03-04 DIAGNOSIS — L439 Lichen planus, unspecified: Secondary | ICD-10-CM | POA: Diagnosis not present

## 2015-03-04 DIAGNOSIS — F4322 Adjustment disorder with anxiety: Secondary | ICD-10-CM

## 2015-03-04 DIAGNOSIS — K5900 Constipation, unspecified: Secondary | ICD-10-CM

## 2015-03-04 DIAGNOSIS — G609 Hereditary and idiopathic neuropathy, unspecified: Secondary | ICD-10-CM

## 2015-03-04 DIAGNOSIS — D649 Anemia, unspecified: Secondary | ICD-10-CM

## 2015-03-04 MED ORDER — FUROSEMIDE 20 MG PO TABS: ORAL_TABLET | ORAL | Status: DC

## 2015-03-04 MED ORDER — GABAPENTIN 100 MG PO CAPS: ORAL_CAPSULE | ORAL | Status: DC

## 2015-03-04 MED ORDER — CLOBETASOL PROPIONATE 0.05 % EX CREA: 1.0000 "application " | TOPICAL_CREAM | Freq: Two times a day (BID) | CUTANEOUS | Status: DC

## 2015-03-04 MED ORDER — CETIRIZINE HCL 10 MG PO TABS: 10.0000 mg | ORAL_TABLET | Freq: Every day | ORAL | Status: DC | PRN

## 2015-03-04 MED ORDER — VENLAFAXINE HCL 75 MG PO TABS: 75.0000 mg | ORAL_TABLET | Freq: Three times a day (TID) | ORAL | Status: DC

## 2015-03-04 MED ORDER — POTASSIUM CHLORIDE CRYS ER 20 MEQ PO TBCR: 20.0000 meq | EXTENDED_RELEASE_TABLET | ORAL | Status: DC

## 2015-03-04 NOTE — Patient Instructions (Signed)

## 2015-03-04 NOTE — Progress Notes (Signed)
Pre visit review using our clinic review tool, if applicable. No additional management support is needed unless otherwise documented below in the visit note. 

## 2015-03-14 ENCOUNTER — Encounter: Payer: Self-pay | Admitting: Family Medicine

## 2015-03-14 DIAGNOSIS — G609 Hereditary and idiopathic neuropathy, unspecified: Secondary | ICD-10-CM

## 2015-03-14 HISTORY — DX: Hereditary and idiopathic neuropathy, unspecified: G60.9

## 2015-03-14 NOTE — Assessment & Plan Note (Signed)
Encouraged DASH diet, decrease po intake and increase exercise as tolerated. Needs 7-8 hours of sleep nightly. Avoid trans fats, eat small, frequent meals every 4-5 hours with lean proteins, complex carbs and healthy fats. Minimize simple carbs, GMO foods. 

## 2015-03-14 NOTE — Progress Notes (Signed)
Heather Hanson  [id number] June 12, 1961 03/14/2015      Progress Note-Follow Up  Subjective  Chief Complaint  Chief Complaint  Patient presents with  . Follow-up    mood    HPI  Patient is a 54 y.o. female in today for routine medical care. Patient presents today struggling with depression and anxiety. She has recently quit her job and is now home caring for her to ailing parents. Her dad is very ill and her mother is not willing to leave his side. She is concerned she will lose both parents in the near future. She reports having felt hopeless and as if she didn't want to be here anymore recently but denies any suicidal ideation or suicide plan at this time. Continues to struggle with anxiety and low mood. Her GI symptoms are slightly improved at the present time she does continue to have some heartburn. Is complaining of burning and tingling in her feet as well as ongoing back pain worse at night. Denies CP/palp/SOB/HA/congestion/fevers/GI or GU c/o. Taking meds as prescribed  Past Medical History  Diagnosis Date  . Anemia     thalassemia  . Hyperlipidemia     not on any meds   . Seizure disorder x 2    presumed secondary to ativan withdrawal -Dr Sharene Skeans  . Bursitis of right shoulder   . PUD (peptic ulcer disease)     Dr Loreta Ave ( H. Pylori)  . History of colonoscopy 2000  . Chronic insomnia   . Obesity   . Sinusitis, acute 04/09/2013    takes Zyrtec daily  . Allergic state 04/09/2013  . H. pylori infection 07/19/2013  . Cholecystitis, acute 08/30/2013  . Constipation     takes Linzess daily prn  . Anxiety     takes Ativan daily prn  . Nausea     takes Phenergan prn  . Muscle spasms of head and/or neck     takes Norflex daily prn  . Depression     takes Effexor daily  . Peripheral edema     takes Lasix prn  . Complication of anesthesia     hard to sedate  . Fibromyalgia   . Shortness of breath     pt states when Fibromyalgia flares up  . History of bronchitis    last time many yrs ago  . Migraines     takes Topamax daily-last migraine today  . Headache(784.0) 05/22/2013  . Arthritis     sees Dr.Beekman  . Lichen     [facility]leisher at [facility]  . H/O hiatal hernia   . GERD (gastroesophageal reflux disease) 06/17/2013    takes Dexilant daily prn    Past Surgical History  Procedure Laterality Date  . Tonsillectomy  1972  . Abdominal hysterectomy  2000    partial  . Colonoscopy    . Cholecystectomy N/A 09/16/2013    Procedure: LAPAROSCOPIC CHOLECYSTECTOMY;  Surgeon: Emelia Loron, MD;  Location: [facility] OR;  Service: General;  Laterality: N/A;    Family History  Problem Relation Age of Onset  . Cancer      Breast < 78 yo, ,Prostate <50 yo  . Coronary artery disease      <60 yo  . Diabetes    . Diabetes Mother   . Cancer Father     prostate  . COPD Father     History   Social History  . Marital Status: Divorced    Spouse Name: N/A  . Number of Children: 2  .  Years of Education: N/A   Occupational History  . 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 Smoking status: Never Smoker   . Smokeless tobacco: Never Used  . Alcohol Use: Yes     Comment: wine rarely  . Drug Use: No  . Sexual Activity: Yes    Birth Control/ Protection: Surgical   Other Topics Concern  . Not on file   Social History Narrative   Occupation: Water quality scientist   Divorced     2 sons 26, 9      Never Smoked     Alcohol use-no       Current Outpatient Prescriptions on File Prior to Visit  Medication Sig Dispense Refill  . antipyrine-benzocaine (AURALGAN) otic solution Place 3-4 drops into both ears every 2 (two) hours as needed for ear pain. 10 mL 4  . chlorpheniramine-HYDROcodone (TUSSIONEX) 10-8 MG/5ML LQCR Take 5 mLs by mouth every 12 (twelve) hours as needed for cough. 150 mL 0  . cyclobenzaprine (FLEXERIL) 10 MG tablet TAKE 1 TABLET BY MOUTH AT BEDTIME AS NEEDED FOR MUSCLE SPASMS. 30 tablet 1  . levothyroxine (SYNTHROID, LEVOTHROID) 25  MCG tablet TAKE 1 TABLET BY MOUTH DAILY BEFORE BREAKFAST 30 tablet 3  . LORazepam (ATIVAN) 2 MG tablet Take 1 tablet (2 mg total) by mouth every 6 (six) hours as needed for anxiety. 120 tablet 3  . mometasone (NASONEX) 50 MCG/ACT nasal spray Place 2 sprays into the nose daily as needed. Allergies    . NON FORMULARY ICY HOT SMART RELIEF    . orphenadrine (NORFLEX) 100 MG tablet TAKE 1 TABLET BY MOUTH EVERY MORNING AS NEEDED. 30 tablet 1  . SUMAtriptan (IMITREX) 20 MG/ACT nasal spray PLACE 1 SPRAY (20 MG TOTAL) INTO THE NOSE EVERY 2 (TWO) HOURS AS NEEDED FOR MIGRAINE OR HEADACHE. MAX OF 2 DOSES IN 24 HOURS 1 Inhaler 1  . topiramate (TOPAMAX) 50 MG tablet TAKE 1 TABLET BY MOUTH 3 TIMES A DAY 90 tablet 2  . triamcinolone cream (KENALOG) 0.5 % Apply 1 application topically 3 (three) times daily. 30 g 5  . NALTREXONE HCL PO Take 4 mg by mouth at bedtime.     No current facility-administered medications on file prior to visit.    Allergies  Allergen Reactions  . Augmentin [Amoxicillin-Pot Clavulanate] Other (See Comments)    Blisters in the back of throat  . Dexilant [Dexlansoprazole] Other (See Comments)    Bowel urgency and liquid stools  . Hydrocodone-Acetaminophen Hives    REACTION: Rash  . Levsin [Hyoscyamine Sulfate] Other (See Comments)    Projectile vomitting  . Linzess [Linaclotide] Other (See Comments)    Bowel urgency and liquid stools  . Promethazine Nausea And Vomiting  . Propoxyphene N-Acetaminophen Itching    REACTION: rash  . Acetaminophen Itching and Rash  . Latex Itching and Rash    Rash-looks like she has the measles per pt  . Oxycodone-Acetaminophen Itching and Rash    REACTION: Rash    Review of Systems  Review of Systems  Constitutional: Positive for malaise/fatigue. Negative for fever.  HENT: Negative for congestion.   Eyes: Negative for discharge.  Respiratory: Negative for shortness of breath.   Cardiovascular: Negative for chest pain, palpitations and leg  swelling.  Gastrointestinal: Positive for heartburn. Negative for nausea, abdominal pain and diarrhea.  Genitourinary: Negative for dysuria.  Musculoskeletal: Positive for back pain. Negative for falls.  Skin: Negative for rash.  Neurological: Positive for tingling.  Negative for loss of consciousness and headaches.  Endo/Heme/Allergies: Negative for polydipsia.  Psychiatric/Behavioral: Positive for depression. Negative for suicidal ideas. The patient is nervous/anxious. The patient does not have insomnia.     Objective  BP 120/76 mmHg  Pulse 91  Temp(Src) 98.1 F (36.7 C) (Oral)  Resp 16  Wt 211 lb 8 oz (95.936 kg)  SpO2 97%  Physical Exam  Physical Exam  Constitutional: She is oriented to person, place, and time and well-developed, well-nourished, and in no distress. No distress.  HENT:  Head: Normocephalic and atraumatic.  Eyes: Conjunctivae are normal.  Neck: Neck supple. No thyromegaly present.  Cardiovascular: Normal rate, regular rhythm and normal heart sounds.   No murmur heard. Pulmonary/Chest: Effort normal and breath sounds normal. She has no wheezes.  Abdominal: She exhibits no distension and no mass.  Musculoskeletal: She exhibits no edema.  Lymphadenopathy:    She has no cervical adenopathy.  Neurological: She is alert and oriented to person, place, and time.  Skin: Skin is warm and dry. No rash noted. She is not diaphoretic.  Psychiatric: Memory, affect and judgment normal.    Lab Results  Component Value Date   TSH 1.204 07/06/2014   Lab Results  Component Value Date   WBC 4.7 07/06/2014   HGB 11.7 07/06/2014   HCT 36.8 07/06/2014   MCV 72* 07/06/2014   PLT 204 07/06/2014   Lab Results  Component Value Date   CREATININE 0.93 07/06/2014   BUN 16 07/06/2014   NA 136 07/06/2014   K 3.7 07/06/2014   CL 102 07/06/2014   CO2 25 07/06/2014   Lab Results  Component Value Date   ALT 11 07/06/2014   AST 18 07/06/2014   ALKPHOS 82 07/06/2014    BILITOT 0.3 07/06/2014   Lab Results  Component Value Date   CHOL 297* 03/09/2014   Lab Results  Component Value Date   HDL 81 03/09/2014   Lab Results  Component Value Date   LDLCALC 200* 03/09/2014   Lab Results  Component Value Date   TRIG 79 03/09/2014   Lab Results  Component Value Date   CHOLHDL 3.7 03/09/2014     Assessment & Plan  Constipation Encouraged increased hydration and fiber in diet. Daily probiotics. If bowels not moving can use MOM 2 tbls po in 4 oz of warm prune juice by mouth every 2-3 days. If no results then repeat in 4 hours with  Dulcolax suppository pr, may repeat again in 4 more hours as needed. Seek care if symptoms worsen. Consider daily Miralax and/or Dulcolax if symptoms persist. Is improved with Linzess which she uses intermittently   Hyperlipidemia, mixed Encouraged heart healthy diet, increase exercise, avoid trans fats, consider a krill oil cap daily   Obesity Encouraged DASH diet, decrease po intake and increase exercise as tolerated. Needs 7-8 hours of sleep nightly. Avoid trans fats, eat small, frequent meals every 4-5 hours with lean proteins, complex carbs and healthy fats. Minimize simple carbs, GMO foods.   Anemia Increase leafy greens, consider increased lean red meat and using cast iron cookware. 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 Is very tearful and ha just quit her job. Agrees to seek care immediately if she worsens and is at risk for self harm. Venlafaxinet 75 mg tid. Encouraged to proceed with counseling.   GERD (gastroesophageal reflux disease) Avoid offending foods, start probiotics.  Do not eat large meals in late evening and consider raising head of bed. Continue current meds   Hypothyroidism On Levothyroxine, continue to monitor   Hereditary and idiopathic peripheral neuropathy Symptoms worse at night,  will try titrating up Gabapentin

## 2015-03-14 NOTE — Assessment & Plan Note (Signed)
Increase leafy greens, consider increased lean red meat and using cast iron cookware. Continue to monitor, report any concerns 

## 2015-03-14 NOTE — Assessment & Plan Note (Signed)
Encouraged heart healthy diet, increase exercise, avoid trans fats, consider a krill oil cap daily 

## 2015-03-14 NOTE — Assessment & Plan Note (Signed)
On Levothyroxine, continue to monitor 

## 2015-03-14 NOTE — Assessment & Plan Note (Signed)
Encouraged increased hydration and fiber in diet. Daily probiotics. If bowels not moving can use MOM 2 tbls po in 4 oz of warm prune juice by mouth every 2-3 days. If no results then repeat in 4 hours with  Dulcolax suppository pr, may repeat again in 4 more hours as needed. Seek care if symptoms worsen. Consider daily Miralax and/or Dulcolax if symptoms persist. Is improved with Linzess which she uses intermittently

## 2015-03-14 NOTE — Assessment & Plan Note (Addendum)
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 Is very tearful and ha just quit her job. Agrees to seek care immediately if she worsens and is at risk for self harm. Venlafaxinet 75 mg tid. Encouraged to proceed with counseling.

## 2015-03-14 NOTE — Assessment & Plan Note (Signed)
Avoid offending foods, start probiotics. Do not eat large meals in late evening and consider raising head of bed. Continue current meds 

## 2015-03-14 NOTE — Assessment & Plan Note (Signed)
Symptoms worse at night, will try titrating up Gabapentin

## 2015-03-19 ENCOUNTER — Ambulatory Visit (INDEPENDENT_AMBULATORY_CARE_PROVIDER_SITE_OTHER): Payer: 59 | Admitting: Psychology

## 2015-03-19 DIAGNOSIS — F322 Major depressive disorder, single episode, severe without psychotic features: Secondary | ICD-10-CM

## 2015-03-29 ENCOUNTER — Other Ambulatory Visit: Payer: Self-pay | Admitting: Family Medicine

## 2015-03-29 ENCOUNTER — Ambulatory Visit: Payer: 59 | Admitting: Family Medicine

## 2015-03-29 DIAGNOSIS — F411 Generalized anxiety disorder: Secondary | ICD-10-CM

## 2015-03-29 MED ORDER — LORAZEPAM 2 MG PO TABS
2.0000 mg | ORAL_TABLET | Freq: Four times a day (QID) | ORAL | Status: DC | PRN
Start: 2015-03-29 — End: 2015-03-30

## 2015-03-29 NOTE — Telephone Encounter (Signed)
Last Office Visit 03/04/15 Last Refill on 12/03/14  #120 with 3 refills.

## 2015-03-30 ENCOUNTER — Ambulatory Visit (INDEPENDENT_AMBULATORY_CARE_PROVIDER_SITE_OTHER): Payer: 59 | Admitting: Family Medicine

## 2015-03-30 ENCOUNTER — Telehealth: Payer: Self-pay | Admitting: Family Medicine

## 2015-03-30 ENCOUNTER — Encounter: Payer: Self-pay | Admitting: Family Medicine

## 2015-03-30 VITALS — BP 120/72 | HR 90 | Temp 97.9°F | Ht 68.0 in | Wt 214.2 lb

## 2015-03-30 DIAGNOSIS — E669 Obesity, unspecified: Secondary | ICD-10-CM

## 2015-03-30 DIAGNOSIS — F411 Generalized anxiety disorder: Secondary | ICD-10-CM | POA: Diagnosis not present

## 2015-03-30 DIAGNOSIS — L439 Lichen planus, unspecified: Secondary | ICD-10-CM

## 2015-03-30 DIAGNOSIS — M542 Cervicalgia: Secondary | ICD-10-CM

## 2015-03-30 DIAGNOSIS — E782 Mixed hyperlipidemia: Secondary | ICD-10-CM | POA: Diagnosis not present

## 2015-03-30 DIAGNOSIS — E039 Hypothyroidism, unspecified: Secondary | ICD-10-CM

## 2015-03-30 DIAGNOSIS — K219 Gastro-esophageal reflux disease without esophagitis: Secondary | ICD-10-CM

## 2015-03-30 DIAGNOSIS — M5440 Lumbago with sciatica, unspecified side: Secondary | ICD-10-CM

## 2015-03-30 DIAGNOSIS — L241 Irritant contact dermatitis due to oils and greases: Secondary | ICD-10-CM

## 2015-03-30 DIAGNOSIS — D649 Anemia, unspecified: Secondary | ICD-10-CM

## 2015-03-30 DIAGNOSIS — F418 Other specified anxiety disorders: Secondary | ICD-10-CM

## 2015-03-30 LAB — CBC
HCT: 36.4 % (ref 36.0–46.0)
Hemoglobin: 11.8 g/dL — ABNORMAL LOW (ref 12.0–15.0)
MCHC: 32.4 g/dL (ref 30.0–36.0)
MCV: 69.9 fl — ABNORMAL LOW (ref 78.0–100.0)
PLATELETS: 198 10*3/uL (ref 150.0–400.0)
RBC: 5.21 Mil/uL — ABNORMAL HIGH (ref 3.87–5.11)
RDW: 15.7 % — ABNORMAL HIGH (ref 11.5–15.5)
WBC: 4.2 10*3/uL (ref 4.0–10.5)

## 2015-03-30 LAB — LIPID PANEL
CHOLESTEROL: 296 mg/dL — AB (ref 0–200)
HDL: 77.2 mg/dL (ref >39.00)
LDL Cholesterol: 202 mg/dL — ABNORMAL HIGH (ref 0–99)
NonHDL: 218.8
Total CHOL/HDL Ratio: 4
Triglycerides: 85 mg/dL (ref 0.0–149.0)
VLDL: 17 mg/dL (ref 0.0–40.0)

## 2015-03-30 LAB — COMPREHENSIVE METABOLIC PANEL
ALBUMIN: 3.9 g/dL (ref 3.5–5.2)
ALK PHOS: 88 U/L (ref 39–117)
ALT: 13 U/L (ref 0–35)
AST: 20 U/L (ref 0–37)
BILIRUBIN TOTAL: 0.3 mg/dL (ref 0.2–1.2)
BUN: 14 mg/dL (ref 6–23)
CO2: 28 mEq/L (ref 19–32)
Calcium: 9 mg/dL (ref 8.4–10.5)
Chloride: 106 mEq/L (ref 96–112)
Creatinine, Ser: 0.96 mg/dL (ref 0.40–1.20)
GFR: 77.37 mL/min (ref >60.00)
GLUCOSE: 75 mg/dL (ref 70–99)
Potassium: 3.5 mEq/L (ref 3.5–5.1)
Sodium: 139 mEq/L (ref 135–145)
Total Protein: 6.9 g/dL (ref 6.0–8.3)

## 2015-03-30 LAB — TSH: TSH: 2.84 u[IU]/mL (ref 0.35–4.50)

## 2015-03-30 MED ORDER — GABAPENTIN 300 MG PO CAPS: 300.0000 mg | ORAL_CAPSULE | Freq: Three times a day (TID) | ORAL | Status: DC

## 2015-03-30 MED ORDER — CLOBETASOL PROPIONATE 0.05 % EX CREA: 1.0000 "application " | TOPICAL_CREAM | Freq: Two times a day (BID) | CUTANEOUS | Status: DC

## 2015-03-30 MED ORDER — VENLAFAXINE HCL 75 MG PO TABS: 75.0000 mg | ORAL_TABLET | Freq: Three times a day (TID) | ORAL | Status: DC

## 2015-03-30 MED ORDER — LORAZEPAM 2 MG PO TABS
2.0000 mg | ORAL_TABLET | Freq: Four times a day (QID) | ORAL | Status: DC | PRN
Start: 2015-03-30 — End: 2015-07-26

## 2015-03-30 MED ORDER — TRIAMCINOLONE ACETONIDE 0.5 % EX CREA: 1.0000 "application " | TOPICAL_CREAM | Freq: Three times a day (TID) | CUTANEOUS | Status: DC

## 2015-03-30 MED ORDER — LEVOTHYROXINE SODIUM 25 MCG PO TABS: ORAL_TABLET | ORAL | Status: DC

## 2015-03-30 NOTE — Progress Notes (Signed)
Pre visit review using our clinic review tool, if applicable. No additional management support is needed unless otherwise documented below in the visit note. 

## 2015-03-30 NOTE — Telephone Encounter (Signed)
Faxed hardcopy for Lorazepam to [facility]. 

## 2015-03-30 NOTE — Telephone Encounter (Signed)
Per, Zella Ballobin. Ok to schedule 05/11/15 appointment as a 15 minute, not 30 minutes.

## 2015-03-30 NOTE — Patient Instructions (Signed)
Fibromyalgia Fibromyalgia is a disorder that is often misunderstood. It is associated with muscular pains and tenderness that comes and goes. It is often associated with fatigue and sleep disturbances. Though it tends to be long-lasting, fibromyalgia is not life-threatening. CAUSES  The exact cause of fibromyalgia is unknown. People with certain gene types are predisposed to developing fibromyalgia and other conditions. Certain factors can play a role as triggers, such as:  Spine disorders.  Arthritis.  Severe injury (trauma) and other physical stressors.  Emotional stressors. SYMPTOMS   The main symptom is pain and stiffness in the muscles and joints, which can vary over time.  Sleep and fatigue problems. Other related symptoms may include:  Bowel and bladder problems.  Headaches.  Visual problems.  Problems with odors and noises.  Depression or mood changes.  Painful periods (dysmenorrhea).  Dryness of the skin or eyes. DIAGNOSIS  There are no specific tests for diagnosing fibromyalgia. Patients can be diagnosed accurately from the specific symptoms they have. The diagnosis is made by determining that nothing else is causing the problems. TREATMENT  There is no cure. Management includes medicines and an active, healthy lifestyle. The goal is to enhance physical fitness, decrease pain, and improve sleep. HOME CARE INSTRUCTIONS   Only take over-the-counter or prescription medicines as directed by your caregiver. Sleeping pills, tranquilizers, and pain medicines may make your problems worse.  Low-impact aerobic exercise is very important and advised for treatment. At first, it may seem to make pain worse. Gradually increasing your tolerance will overcome this feeling.  Learning relaxation techniques and how to control stress will help you. Biofeedback, visual imagery, hypnosis, muscle relaxation, yoga, and meditation are all options.  Anti-inflammatory medicines and  physical therapy may provide short-term help.  Acupuncture or massage treatments may help.  Take muscle relaxant medicines as suggested by your caregiver.  Avoid stressful situations.  Plan a healthy lifestyle. This includes your diet, sleep, rest, exercise, and friends.  Find and practice a hobby you enjoy.  Join a fibromyalgia support group for interaction, ideas, and sharing advice. This may be helpful. SEEK MEDICAL CARE IF:  You are not having good results or improvement from your treatment. FOR MORE INFORMATION  National Fibromyalgia Association: www.fmaware.org Arthritis Foundation: www.arthritis.org Document Released: 11/27/2005 Document Revised: 02/19/2012 Document Reviewed: 03/09/2010 ExitCare Patient Information 2015 [employer]. This information is not intended to replace advice given to you by your health care provider. Make sure you discuss any questions you have with your health care provider.  

## 2015-03-31 ENCOUNTER — Ambulatory Visit (HOSPITAL_BASED_OUTPATIENT_CLINIC_OR_DEPARTMENT_OTHER)
Admission: RE | Admit: 2015-03-31 | Discharge: 2015-03-31 | Disposition: A | Discharge status: Home / Self Care | Payer: Self-pay | Source: Ambulatory Visit | Attending: Family Medicine | Admitting: Family Medicine

## 2015-03-31 DIAGNOSIS — M5031 Other cervical disc degeneration,  high cervical region: Secondary | ICD-10-CM | POA: Insufficient documentation

## 2015-03-31 DIAGNOSIS — M542 Cervicalgia: Secondary | ICD-10-CM | POA: Insufficient documentation

## 2015-03-31 DIAGNOSIS — M5032 Other cervical disc degeneration, mid-cervical region: Secondary | ICD-10-CM | POA: Insufficient documentation

## 2015-03-31 DIAGNOSIS — M5412 Radiculopathy, cervical region: Secondary | ICD-10-CM | POA: Insufficient documentation

## 2015-04-01 ENCOUNTER — Telehealth: Payer: Self-pay | Admitting: Family Medicine

## 2015-04-01 ENCOUNTER — Other Ambulatory Visit: Payer: Self-pay | Admitting: Family Medicine

## 2015-04-01 DIAGNOSIS — M542 Cervicalgia: Secondary | ICD-10-CM

## 2015-04-01 MED ORDER — ATORVASTATIN CALCIUM 10 MG PO TABS: 10.0000 mg | ORAL_TABLET | Freq: Every day | ORAL | Status: DC

## 2015-04-01 NOTE — Telephone Encounter (Signed)
Just saw results see those

## 2015-04-01 NOTE — Telephone Encounter (Signed)
The patient would like results from her neck xray done on 03/31/15.  States she is having a lot of pain in her neck and if possible would like a pain patch sent to her pharmacy or OTC to take.

## 2015-04-01 NOTE — Telephone Encounter (Signed)
Referral placed. They will proceed with steroids if necessary

## 2015-04-01 NOTE — Telephone Encounter (Signed)
Patient was informed of results 

## 2015-04-01 NOTE — Telephone Encounter (Signed)
Relation to pt: self  Call back number: [phone]   Reason for call:  Requesting a othropedic referral and x ray results and would like the cortisone shot pt states neck pain has increased

## 2015-04-02 ENCOUNTER — Ambulatory Visit: Payer: 59 | Admitting: Psychology

## 2015-04-02 NOTE — Telephone Encounter (Signed)
Patient informed referral done.

## 2015-04-09 ENCOUNTER — Encounter: Payer: Self-pay | Admitting: Family Medicine

## 2015-04-11 ENCOUNTER — Encounter: Payer: Self-pay | Admitting: Family Medicine

## 2015-04-11 NOTE — Assessment & Plan Note (Signed)
Avoid offending foods, start probiotics. Do not eat large meals in late evening and consider raising head of bed.  

## 2015-04-11 NOTE — Assessment & Plan Note (Signed)
Encouraged heart healthy diet, increase exercise, avoid trans fats, consider a krill oil cap daily. Due to levels patient agrees to start Atorvastatin 10 mg po qhs.

## 2015-04-11 NOTE — Assessment & Plan Note (Addendum)
Pain continues to be daily and debilitating at times. Will increase Gabapentin to tid and see if that is helpful. Will care with ortho and possibly neurosurg during to her level of pain

## 2015-04-11 NOTE — Assessment & Plan Note (Addendum)
Patient is now out of work and caring for her ailing parents. She has been struggling with depression but reports she is stable today. She has had medication changes and started with counseling and feels that is helpful. She agrees [facility]

## 2015-04-11 NOTE — Progress Notes (Signed)
Heather Hanson  [id number] 10/08/1961 04/11/2015      Progress Note-Follow Up  Subjective  Chief Complaint  Chief Complaint  Patient presents with  . Follow-up    Stress/anxiety    HPI  Patient is a 54 y.o. female in today for routine medical care. Patient is in today for follow-up continues to struggle with significant pain. She has diffuse pain. She has myalgias, arthralgias, back pain, neck pain, headaches. She is under great stress. Has recently stopped working and is caring for her ailing parents and her father is doing very poorly. She denies suicidal ideation but she acknowledges her depression is still. Heavy. She does feel the changes recently instituted have been somewhat helpful. No recent illness or fever. Denies CP/palp/SOB/HA/congestion/fevers/GI or GU c/o. Taking meds as prescribed  Past Medical History  Diagnosis Date  . Anemia     thalassemia  . Hyperlipidemia     not on any meds   . Seizure disorder x 2    presumed secondary to ativan withdrawal -Dr Sharene Skeans  . Bursitis of right shoulder   . PUD (peptic ulcer disease)     Dr Loreta Ave ( H. Pylori)  . History of colonoscopy 2000  . Chronic insomnia   . Obesity   . Sinusitis, acute 04/09/2013    takes Zyrtec daily  . Allergic state 04/09/2013  . H. pylori infection 07/19/2013  . Cholecystitis, acute 08/30/2013  . Constipation     takes Linzess daily prn  . Anxiety     takes Ativan daily prn  . Nausea     takes Phenergan prn  . Muscle spasms of head and/or neck     takes Norflex daily prn  . Depression     takes Effexor daily  . Peripheral edema     takes Lasix prn  . Complication of anesthesia     hard to sedate  . Fibromyalgia   . Shortness of breath     pt states when Fibromyalgia flares up  . History of bronchitis     last time many yrs ago  . Migraines     takes Topamax daily-last migraine today  . Headache(784.0) 05/22/2013  . Arthritis     sees Dr.Beekman  . [place]     Dr[facility]Fleisher at  [facility]  . H/O hiatal hernia   . GERD (gastroesophageal reflux disease) 06/17/2013    takes Dexilant daily prn  . Hereditary and idiopathic peripheral neuropathy 03/14/2015  . Adjustment disorder with mixed anxiety and depressed mood 09/28/2009    Qualifier: Diagnosis of  By: Nena Jordan     Past Surgical History  Procedure Laterality Date  . Tonsillectomy  1972  . Abdominal hysterectomy  2000    partial  . Colonoscopy    . Cholecystectomy N/A 09/16/2013    Procedure: LAPAROSCOPIC CHOLECYSTECTOMY;  Surgeon: Emelia Loron, MD;  Location: [facility] OR;  Service: General;  Laterality: N/A;    Family History  Problem Relation Age of Onset  . Cancer      Breast < 59 yo, ,Prostate <50 yo  . Coronary artery disease      <60 yo  . Diabetes    . Diabetes Mother   . Cancer Father     prostate  . COPD Father     History   Social History  . Marital Status: Divorced    Spouse Name: N/A  . Number of Children: 2  . Years of Education: N/A   Occupational History  .  [employer]     phlebotomist   Social History Main Topics  . Smoking status: Never Smoker   . Smokeless tobacco: Never Used  . Alcohol Use: Yes     Comment: wine rarely  . Drug Use: No  . Sexual Activity: Yes    Birth Control/ Protection: Surgical   Other Topics Concern  . Not on file   Social History Narrative   Occupation: Water quality scientist   Divorced     2 sons 26, 93      Never Smoked     Alcohol use-no       Current Outpatient Prescriptions on File Prior to Visit  Medication Sig Dispense Refill  . cetirizine (ZYRTEC) 10 MG tablet Take 1 tablet (10 mg total) by mouth daily as needed for allergies or rhinitis. 100 tablet 1  . cyclobenzaprine (FLEXERIL) 10 MG tablet TAKE 1 TABLET BY MOUTH AT BEDTIME AS NEEDED FOR MUSCLE SPASMS. 30 tablet 1  . furosemide (LASIX) 20 MG tablet TAKE 1 TABLET (20 MG TOTAL) BY MOUTH DAILY AS NEEDED. 30 tablet 6  . mometasone (NASONEX) 50 MCG/ACT nasal spray Place 2 sprays into the  nose daily as needed. Allergies    . NALTREXONE HCL PO Take 4 mg by mouth at bedtime.    . NON FORMULARY ICY HOT SMART RELIEF    . orphenadrine (NORFLEX) 100 MG tablet TAKE 1 TABLET BY MOUTH EVERY MORNING AS NEEDED. 30 tablet 1  . potassium chloride SA (K-DUR,KLOR-CON) 20 MEQ tablet Take 1 tablet (20 mEq total) by mouth as directed. 30 tablet 6  . topiramate (TOPAMAX) 50 MG tablet TAKE 1 TABLET BY MOUTH 3 TIMES A DAY 90 tablet 2  . oxyCODONE (OXY IR/ROXICODONE) 5 MG immediate release tablet   0  . SUMAtriptan (IMITREX) 20 MG/ACT nasal spray PLACE 1 SPRAY (20 MG TOTAL) INTO THE NOSE EVERY 2 (TWO) HOURS AS NEEDED FOR MIGRAINE OR HEADACHE. MAX OF 2 DOSES IN 24 HOURS (Patient not taking: Reported on 03/30/2015) 1 Inhaler 1   No current facility-administered medications on file prior to visit.    Allergies  Allergen Reactions  . Augmentin [Amoxicillin-Pot Clavulanate] Other (See Comments)    Blisters in the back of throat  . Dexilant [Dexlansoprazole] Other (See Comments)    Bowel urgency and liquid stools  . Hydrocodone-Acetaminophen Hives    REACTION: Rash  . Levsin [Hyoscyamine Sulfate] Other (See Comments)    Projectile vomitting  . Linzess [Linaclotide] Other (See Comments)    Bowel urgency and liquid stools  . Promethazine Nausea And Vomiting  . Propoxyphene N-Acetaminophen Itching    REACTION: rash  . Acetaminophen Itching and Rash  . Latex Itching and Rash    Rash-looks like she has the measles per pt  . Oxycodone-Acetaminophen Itching and Rash    REACTION: Rash    Review of Systems  Review of Systems  Constitutional: Positive for malaise/fatigue. Negative for fever.  HENT: Negative for congestion.   Eyes: Negative for discharge.  Respiratory: Negative for shortness of breath.   Cardiovascular: Negative for chest pain, palpitations and leg swelling.  Gastrointestinal: Positive for nausea and constipation. Negative for abdominal pain and diarrhea.  Genitourinary: Negative  for dysuria.  Musculoskeletal: Positive for myalgias, back pain, joint pain and neck pain. Negative for falls.  Skin: Negative for rash.  Neurological: Negative for loss of consciousness and headaches.  Endo/Heme/Allergies: Negative for polydipsia.  Psychiatric/Behavioral: Positive for depression. Negative for suicidal ideas. The patient is nervous/anxious. The patient does not  have insomnia.     Objective  BP 120/72 mmHg  Pulse 90  Temp(Src) 97.9 F (36.6 C) (Oral)  Ht 5\' 8"  (1.727 m)  Wt 214 lb 4 oz (97.183 kg)  BMI 32.58 kg/m2  SpO2 97%  Physical Exam  Physical Exam  Constitutional: She is oriented to person, place, and time and well-developed, well-nourished, and in no distress. No distress.  HENT:  Head: Normocephalic and atraumatic.  Eyes: Conjunctivae are normal.  Neck: Neck supple. No thyromegaly present.  Cardiovascular: Normal rate, regular rhythm and normal heart sounds.   No murmur heard. Pulmonary/Chest: Effort normal and breath sounds normal. She has no wheezes.  Abdominal: She exhibits no distension and no mass.  Musculoskeletal: She exhibits no edema.  Lymphadenopathy:    She has no cervical adenopathy.  Neurological: She is alert and oriented to person, place, and time.  Skin: Skin is warm and dry. No rash noted. She is not diaphoretic.  Psychiatric: Memory, affect and judgment normal.    Lab Results  Component Value Date   TSH 2.84 03/30/2015   Lab Results  Component Value Date   WBC 4.2 03/30/2015   HGB 11.8* 03/30/2015   HCT 36.4 03/30/2015   MCV 69.9* 03/30/2015   PLT 198.0 03/30/2015   Lab Results  Component Value Date   CREATININE 0.96 03/30/2015   BUN 14 03/30/2015   NA 139 03/30/2015   K 3.5 03/30/2015   CL 106 03/30/2015   CO2 28 03/30/2015   Lab Results  Component Value Date   ALT 13 03/30/2015   AST 20 03/30/2015   ALKPHOS 88 03/30/2015   BILITOT 0.3 03/30/2015   Lab Results  Component Value Date   CHOL 296* 03/30/2015     Lab Results  Component Value Date   HDL 77.20 03/30/2015   Lab Results  Component Value Date   LDLCALC 202* 03/30/2015   Lab Results  Component Value Date   TRIG 85.0 03/30/2015   Lab Results  Component Value Date   CHOLHDL 4 03/30/2015     Assessment & Plan  GERD (gastroesophageal reflux disease) Avoid offending foods, start probiotics. Do not eat large meals in late evening and consider raising head of bed.    Depression with anxiety Patient is now out of work and caring for her ailing parents. She has been struggling with depression but reports she is stable today. She has had medication changes and started with counseling and feels that is helpful. She agrees [facility]   Back pain Pain continues to be daily and debilitating at times. Will increase Gabapentin to tid and see if that is helpful. Will care with ortho and possibly neurosurg during to her level of pain   Obesity Encouraged DASH diet, decrease po intake and increase exercise as tolerated. Needs 7-8 hours of sleep nightly. Avoid trans fats, eat small, frequent meals every 4-5 hours with lean proteins, complex carbs and healthy fats. Minimize simple carbs   Hyperlipidemia, mixed Encouraged heart healthy diet, increase exercise, avoid trans fats, consider a krill oil cap daily. Due to levels patient agrees to start Atorvastatin 10 mg po qhs.

## 2015-04-11 NOTE — Assessment & Plan Note (Signed)
Encouraged DASH diet, decrease po intake and increase exercise as tolerated. Needs 7-8 hours of sleep nightly. Avoid trans fats, eat small, frequent meals every 4-5 hours with lean proteins, complex carbs and healthy fats. Minimize simple carbs 

## 2015-04-15 ENCOUNTER — Other Ambulatory Visit: Payer: Self-pay | Admitting: Family Medicine

## 2015-04-23 ENCOUNTER — Other Ambulatory Visit: Payer: Self-pay | Admitting: Family Medicine

## 2015-04-27 ENCOUNTER — Telehealth: Payer: Self-pay | Admitting: *Deleted

## 2015-04-27 NOTE — Telephone Encounter (Signed)
Signed medical record release received via mail from [employer]. Forwarded to [place] to scan/email to medical records. JG//CMA

## 2015-05-04 ENCOUNTER — Telehealth: Payer: Self-pay | Admitting: Family Medicine

## 2015-05-04 NOTE — Telephone Encounter (Signed)
Please disregard previous message.

## 2015-05-04 NOTE — Telephone Encounter (Signed)
Relation to pt: self  Call back number: [phone]   Reason for call:  Pt is concerned about LORazepam (ATIVAN) 2 MG tablet being refilled due to the fact 05/11/15 appointment had to be rescheduled to 06/15/2015. Pt would like to speak with MD.

## 2015-05-11 ENCOUNTER — Ambulatory Visit: Payer: Self-pay | Admitting: Family Medicine

## 2015-05-26 ENCOUNTER — Other Ambulatory Visit: Payer: Self-pay | Admitting: Family Medicine

## 2015-05-28 ENCOUNTER — Telehealth: Payer: Self-pay | Admitting: General Practice

## 2015-05-28 NOTE — Telephone Encounter (Signed)
Called pt to verify last mammogram, pt states that this was completed in 2014, pt has been scared to have done since her fibromyalgia is so bad. Last OV with provider was 03-30-15. Do not see a record of CPE with provider.   Pt also states that she would like ortho referral completed again, could not go the first time since she did not have insurance. Last Referral was to ortho surgery on 04/01/15 for neck pain

## 2015-06-15 ENCOUNTER — Ambulatory Visit (INDEPENDENT_AMBULATORY_CARE_PROVIDER_SITE_OTHER): Payer: 59 | Admitting: Family Medicine

## 2015-06-15 ENCOUNTER — Encounter: Payer: Self-pay | Admitting: Family Medicine

## 2015-06-15 VITALS — BP 122/86 | HR 78 | Temp 98.3°F | Ht 68.0 in | Wt 223.4 lb

## 2015-06-15 DIAGNOSIS — K5900 Constipation, unspecified: Secondary | ICD-10-CM

## 2015-06-15 DIAGNOSIS — M5440 Lumbago with sciatica, unspecified side: Secondary | ICD-10-CM

## 2015-06-15 DIAGNOSIS — E669 Obesity, unspecified: Secondary | ICD-10-CM

## 2015-06-15 DIAGNOSIS — K219 Gastro-esophageal reflux disease without esophagitis: Secondary | ICD-10-CM | POA: Diagnosis not present

## 2015-06-15 DIAGNOSIS — E039 Hypothyroidism, unspecified: Secondary | ICD-10-CM | POA: Diagnosis not present

## 2015-06-15 DIAGNOSIS — M542 Cervicalgia: Secondary | ICD-10-CM | POA: Diagnosis not present

## 2015-06-15 DIAGNOSIS — F418 Other specified anxiety disorders: Secondary | ICD-10-CM

## 2015-06-15 MED ORDER — CETIRIZINE HCL 10 MG PO TABS: 10.0000 mg | ORAL_TABLET | Freq: Every day | ORAL | Status: DC | PRN

## 2015-06-15 MED ORDER — LEVOTHYROXINE SODIUM 25 MCG PO TABS: ORAL_TABLET | ORAL | Status: DC

## 2015-06-15 MED ORDER — OXYCODONE HCL 5 MG PO TABS: 5.0000 mg | ORAL_TABLET | ORAL | Status: DC | PRN

## 2015-06-15 MED ORDER — TOPIRAMATE 50 MG PO TABS: ORAL_TABLET | ORAL | Status: DC

## 2015-06-15 MED ORDER — GABAPENTIN 600 MG PO TABS
600.0000 mg | ORAL_TABLET | Freq: Three times a day (TID) | ORAL | Status: DC
Start: 2015-06-15 — End: 2015-08-26

## 2015-06-15 NOTE — Patient Instructions (Signed)

## 2015-06-15 NOTE — Progress Notes (Signed)
Pre visit review using our clinic review tool, if applicable. No additional management support is needed unless otherwise documented below in the visit note. 

## 2015-06-20 NOTE — Progress Notes (Signed)
Heather Hanson  [id number] 09-06-1961 06/20/2015      Progress Note-Follow Up  Subjective  Chief Complaint  Chief Complaint  Patient presents with  . Follow-up    HPI  Patient is a 54 y.o. female in today for routine medical care. urine arthritis to look for a start talking patient is in today for follow-up has numerous ongoing complaints. She has chronic pain which is diffuse. She has neck as well as lower back pain. She complains of radiculopathy in all extremities T's but most notably in the right upper extremity. She denies any recent falls or trauma. She notes oxycodone and gabapentin helped with good degree. Her headaches are managed by Topamax most of the time. She's had a great deal of stress lately secondary to disc. Her father last week. She continues to struggle with anhedonia and anxiety as well as low-grade depression but does feel it is improving with medications. No suicidal or homicidal ideation although she does struggle with persistent fatigue. Denies CP/palp/SOB/HA/congestion/fevers/GI or GU c/o. Taking meds as prescribed  Past Medical History  Diagnosis Date  . Anemia     thalassemia  . Hyperlipidemia     not on any meds   . Seizure disorder x 2    presumed secondary to ativan withdrawal -Dr Sharene Skeans  . Bursitis of right shoulder   . PUD (peptic ulcer disease)     Dr Loreta Ave ( H. Pylori)  . History of colonoscopy 2000  . Chronic insomnia   . Obesity   . Sinusitis, acute 04/09/2013    takes Zyrtec daily  . Allergic state 04/09/2013  . H. pylori infection 07/19/2013  . Cholecystitis, acute 08/30/2013  . Constipation     takes Linzess daily prn  . Anxiety     takes Ativan daily prn  . Nausea     takes Phenergan prn  . Muscle spasms of head and/or neck     takes Norflex daily prn  . Depression     takes Effexor daily  . Peripheral edema     takes Lasix prn  . Complication of anesthesia     hard to sedate  . Fibromyalgia   . Shortness of breath     pt  states when Fibromyalgia flares up  . History of bronchitis     last time many yrs ago  . Migraines     takes Topamax daily-last migraine today  . Headache(784.0) 05/22/2013  . Arthritis     sees Dr.Beekman  . Lichen     Dr[facility]Fleisher at [facility]  . H/O hiatal hernia   . GERD (gastroesophageal reflux disease) 06/17/2013    takes Dexilant daily prn  . Hereditary and idiopathic peripheral neuropathy 03/14/2015  . Adjustment disorder with mixed anxiety and depressed mood 09/28/2009    Qualifier: Diagnosis of  By: Nena Jordan     Past Surgical History  Procedure Laterality Date  . Tonsillectomy  1972  . Abdominal hysterectomy  2000    partial  . Colonoscopy    . Cholecystectomy N/A 09/16/2013    Procedure: LAPAROSCOPIC CHOLECYSTECTOMY;  Surgeon: Emelia Loron, MD;  Location: [facility] OR;  Service: General;  Laterality: N/A;    Family History  Problem Relation Age of Onset  . Cancer      Breast < 40 yo, ,Prostate <50 yo  . Coronary artery disease      <60 yo  . Diabetes    . Diabetes Mother     typer 2  .  Heart disease Mother     PVD, PAD  . Cancer Father     prostate, lung, brain mets, smoker  . COPD Father   . Hyperlipidemia Father   . Hypertension Father   . Thyroid disease Father   . Hypertension Sister   . Thyroid disease Sister   . Arthritis Sister   . Diabetes Son 16    type 1  . Diabetes Maternal Grandmother   . Stroke Maternal Grandfather   . Cancer Paternal Grandmother [id number]    breast  . Alcohol abuse Paternal Grandfather   . Hypertension Sister   . Obesity Sister   . Psoriasis Son     History   Social History  . Marital Status: Divorced    Spouse Name: N/A  . Number of Children: 2  . Years of Education: N/A   Occupational History  . [employer]     phlebotomist   Social History Main Topics  . Smoking status: Never Smoker   . Smokeless tobacco: Never Used  . Alcohol Use: Yes     Comment: wine rarely  . Drug Use: No  . Sexual Activity: Yes     Birth Control/ Protection: Surgical     Comment: lives with,    Other Topics Concern  . Not on file   Social History Narrative   Occupation: Water quality scientisthlebotomist   Divorced     2 sons 26, [id number]      Never Smoked     Alcohol use-no       Current Outpatient Prescriptions on File Prior to Visit  Medication Sig Dispense Refill  . clobetasol cream (TEMOVATE) 0.05 % Apply 1 application topically 2 (two) times daily. D/C PREVIOUS SCRIPTS FOR THIS MEDICATION 30 g 6  . cyclobenzaprine (FLEXERIL) 10 MG tablet TAKE 1 TABLET BY MOUTH AT BEDTIME AS NEEDED FOR MUSCLE SPASMS. 30 tablet 6  . furosemide (LASIX) 20 MG tablet TAKE 1 TABLET (20 MG TOTAL) BY MOUTH DAILY AS NEEDED. 30 tablet 6  . LORazepam (ATIVAN) 2 MG tablet Take 1 tablet (2 mg total) by mouth every 6 (six) hours as needed for anxiety. 120 tablet 3  . NON FORMULARY ICY HOT SMART RELIEF    . orphenadrine (NORFLEX) 100 MG tablet TAKE 1 TABLET BY MOUTH EVERY MORNING AS NEEDED. 30 tablet 1  . potassium chloride SA (K-DUR,KLOR-CON) 20 MEQ tablet Take 1 tablet (20 mEq total) by mouth as directed. 30 tablet 6  . SUMAtriptan (IMITREX) 20 MG/ACT nasal spray PLACE 1 SPRAY (20 MG TOTAL) INTO THE NOSE EVERY 2 (TWO) HOURS AS NEEDED FOR MIGRAINE OR HEADACHE. MAX OF 2 DOSES IN 24 HOURS 1 Inhaler 1  . triamcinolone cream (KENALOG) 0.5 % Apply 1 application topically 3 (three) times daily. 30 g 5  . venlafaxine (EFFEXOR) 75 MG tablet Take 1 tablet (75 mg total) by mouth 3 (three) times daily. 90 tablet 5  . mometasone (NASONEX) 50 MCG/ACT nasal spray Place 2 sprays into the nose daily as needed. Allergies    . NALTREXONE HCL PO Take 4 mg by mouth at bedtime.     No current facility-administered medications on file prior to visit.    Allergies  Allergen Reactions  . Augmentin [Amoxicillin-Pot Clavulanate] Other (See Comments)    Blisters in the back of throat  . Dexilant [Dexlansoprazole] Other (See Comments)    Bowel urgency and liquid stools  .  Hydrocodone-Acetaminophen Hives    REACTION: Rash  . Levsin [Hyoscyamine Sulfate] Other (See Comments)  Projectile vomitting  . Linzess [Linaclotide] Other (See Comments)    Bowel urgency and liquid stools  . Promethazine Nausea And Vomiting  . Propoxyphene N-Acetaminophen Itching    REACTION: rash  . Statins Other (See Comments)    myalgia  . Acetaminophen Itching and Rash  . Latex Itching and Rash    Rash-looks like she has the measles per pt  . Oxycodone-Acetaminophen Itching and Rash    REACTION: Rash    Review of Systems  Review of Systems  Constitutional: Negative for fever and malaise/fatigue.  HENT: Negative for congestion.   Eyes: Negative for discharge.  Respiratory: Negative for shortness of breath.   Cardiovascular: Negative for chest pain, palpitations and leg swelling.  Gastrointestinal: Negative for nausea, abdominal pain and diarrhea.  Genitourinary: Negative for dysuria.  Musculoskeletal: Positive for myalgias, back pain, joint pain and neck pain. Negative for falls.  Skin: Negative for rash.  Neurological: Positive for headaches. Negative for loss of consciousness.  Endo/Heme/Allergies: Negative for polydipsia.  Psychiatric/Behavioral: Positive for depression. Negative for suicidal ideas. The patient is nervous/anxious. The patient does not have insomnia.     Objective  BP 122/86 mmHg  Pulse 78  Temp(Src) 98.3 F (36.8 C) (Oral)  Ht 5\' 8"  (1.727 m)  Wt 223 lb 6 oz (101.322 kg)  BMI 33.97 kg/m2  SpO2 99%  Physical Exam  Physical Exam  Constitutional: She is oriented to person, place, and time and well-developed, well-nourished, and in no distress. No distress.  HENT:  Head: Normocephalic and atraumatic.  Eyes: Conjunctivae are normal.  Neck: Neck supple. No thyromegaly present.  Cardiovascular: Normal rate, regular rhythm and normal heart sounds.   No murmur heard. Pulmonary/Chest: Effort normal and breath sounds normal. She has no wheezes.    Abdominal: She exhibits no distension and no mass.  Musculoskeletal: She exhibits no edema.  Lymphadenopathy:    She has no cervical adenopathy.  Neurological: She is alert and oriented to person, place, and time.  Skin: Skin is warm and dry. No rash noted. She is not diaphoretic.  Psychiatric: Memory, affect and judgment normal.    Lab Results  Component Value Date   TSH 2.84 03/30/2015   Lab Results  Component Value Date   WBC 4.2 03/30/2015   HGB 11.8* 03/30/2015   HCT 36.4 03/30/2015   MCV 69.9* 03/30/2015   PLT 198.0 03/30/2015   Lab Results  Component Value Date   CREATININE 0.96 03/30/2015   BUN 14 03/30/2015   NA 139 03/30/2015   K 3.5 03/30/2015   CL 106 03/30/2015   CO2 28 03/30/2015   Lab Results  Component Value Date   ALT 13 03/30/2015   AST 20 03/30/2015   ALKPHOS 88 03/30/2015   BILITOT 0.3 03/30/2015   Lab Results  Component Value Date   CHOL 296* 03/30/2015   Lab Results  Component Value Date   HDL 77.20 03/30/2015   Lab Results  Component Value Date   LDLCALC 202* 03/30/2015   Lab Results  Component Value Date   TRIG 85.0 03/30/2015   Lab Results  Component Value Date   CHOLHDL 4 03/30/2015     Assessment & Plan  Obesity Encouraged DASH diet, decrease po intake and increase exercise as tolerated. Needs 7-8 hours of sleep nightly. Avoid trans fats, eat small, frequent meals every 4-5 hours with lean proteins, complex carbs and healthy fats. Minimize simple carbs  Hypothyroidism On Levothyroxine, continue to monitor  GERD (gastroesophageal reflux disease) Avoid offending  foods, take probiotics. Do not eat large meals in late evening and consider raising head of bed. Continue current meds  Depression with anxiety Continues to struggle after the recent death of her father but feels she is managing with current meds. Encouraged to consider counseling  Constipation Encouraged increased hydration and fiber in diet. Daily  probiotics. If bowels not moving can use MOM 2 tbls po in 4 oz of warm prune juice by mouth every 2-3 days. If no results then repeat in 4 hours with  Dulcolax suppository pr, may repeat again in 4 more hours as needed. Seek care if symptoms worsen. Consider daily Miralax and/or Dulcolax if symptoms persist.   Back pain Diffuse but most notably in cervical region at this time. Encouraged moist heat and gentle stretching as tolerated. May try NSAIDs and prescription meds as directed and report if symptoms worsen or seek immediate care. May continue Oxycodone and Gabapentin for now and referred to ortho for further consideration at this time.

## 2015-06-20 NOTE — Assessment & Plan Note (Signed)
Encouraged DASH diet, decrease po intake and increase exercise as tolerated. Needs 7-8 hours of sleep nightly. Avoid trans fats, eat small, frequent meals every 4-5 hours with lean proteins, complex carbs and healthy fats. Minimize simple carbs 

## 2015-06-20 NOTE — Assessment & Plan Note (Signed)
Avoid offending foods, take probiotics. Do not eat large meals in late evening and consider raising head of bed. Continue current meds 

## 2015-06-20 NOTE — Assessment & Plan Note (Signed)
On Levothyroxine, continue to monitor 

## 2015-06-20 NOTE — Assessment & Plan Note (Signed)
Continues to struggle after the recent death of her father but feels she is managing with current meds. Encouraged to consider counseling

## 2015-06-20 NOTE — Assessment & Plan Note (Signed)
Encouraged increased hydration and fiber in diet. Daily probiotics. If bowels not moving can use MOM 2 tbls po in 4 oz of warm prune juice by mouth every 2-3 days. If no results then repeat in 4 hours with  Dulcolax suppository pr, may repeat again in 4 more hours as needed. Seek care if symptoms worsen. Consider daily Miralax and/or Dulcolax if symptoms persist.  

## 2015-06-20 NOTE — Assessment & Plan Note (Signed)
Diffuse but most notably in cervical region at this time. Encouraged moist heat and gentle stretching as tolerated. May try NSAIDs and prescription meds as directed and report if symptoms worsen or seek immediate care. May continue Oxycodone and Gabapentin for now and referred to ortho for further consideration at this time.

## 2015-06-23 ENCOUNTER — Other Ambulatory Visit: Payer: Self-pay | Admitting: Family Medicine

## 2015-06-23 NOTE — Telephone Encounter (Signed)
Previous rx dc'd refilled on 06/15/15

## 2015-06-28 IMAGING — CT CT ABD-PELV W/ CM
2 of 8 series · 14 of 46 positions shown, 18 images · IV contrast (APPLIED)
Comparison: None.

CLINICAL DATA: Epigastric pain and bloating, 43, weight loss over
last 3 months

CT ABDOMEN AND PELVIS WITH CONTRAST
TECHNIQUE: Multidetector CT imaging of the abdomen and pelvis was
performed following the standard protocol during bolus
administration of intravenous contrast.
Contrast: 100mL OMNIPAQUE IOHEXOL 300 MG/ML  SOLN

[Series 2: abd/pelvis 5.0 b31f · axial · 0.83mm/px · z∈[-597,-207]mm · 11 of 90 slices shown, 15 images]
[im 6/90  soft-tissue]
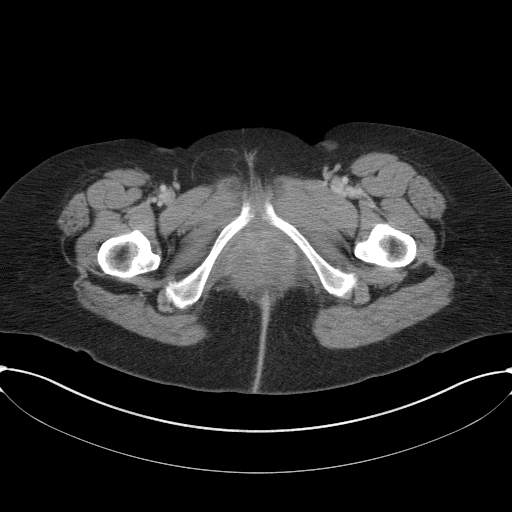
[im 6/90  bone]
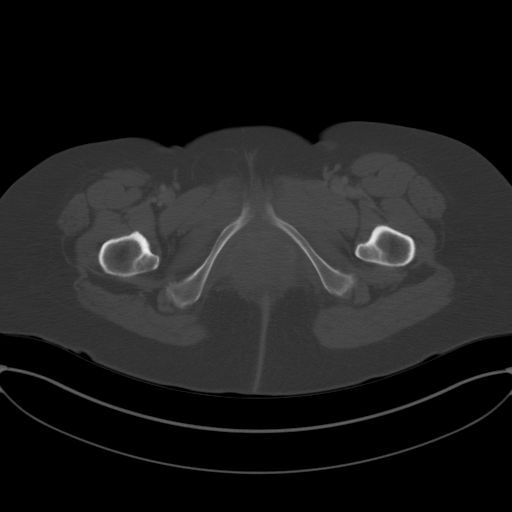
[im 16/90  soft-tissue]
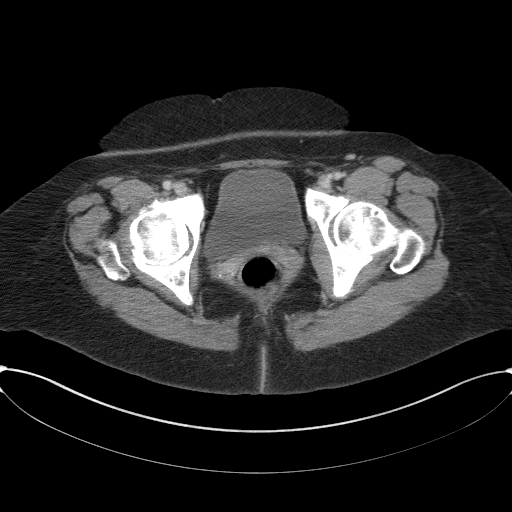
[im 27/90  soft-tissue]
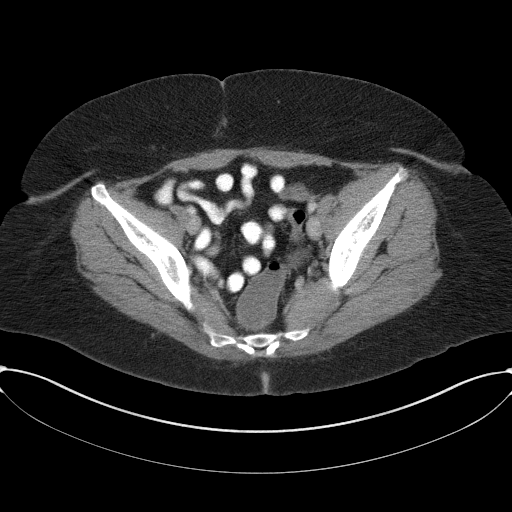
[im 37/90  soft-tissue]
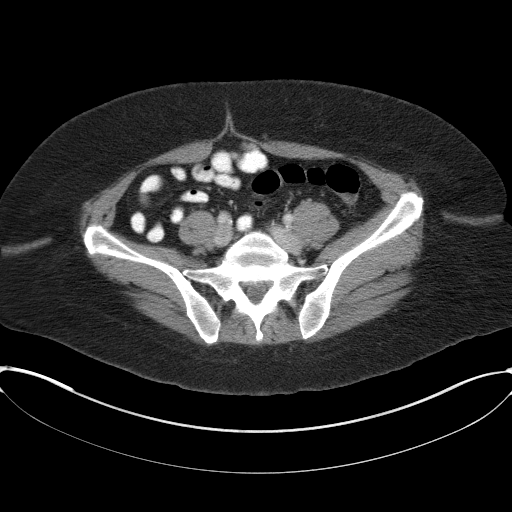
[im 48/90  soft-tissue]
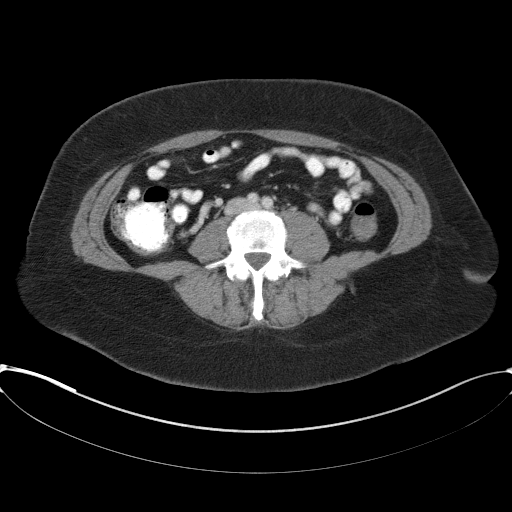
[im 53/90  soft-tissue]
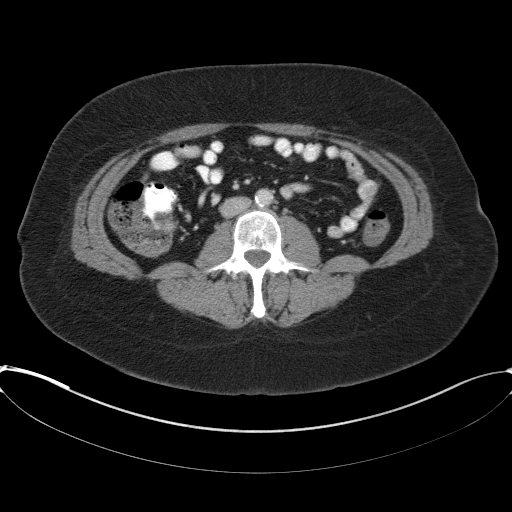
[im 63/90  soft-tissue]
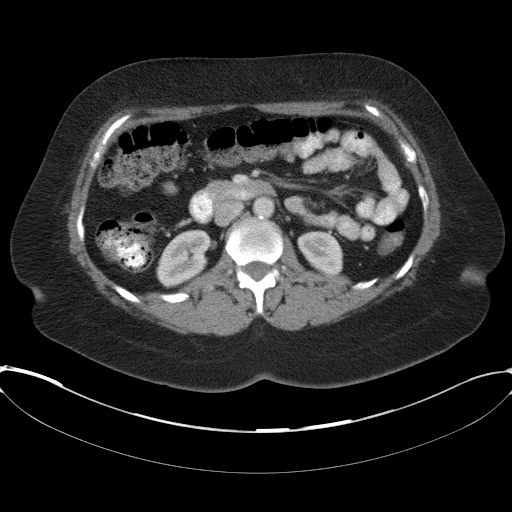
[im 69/90  lung]
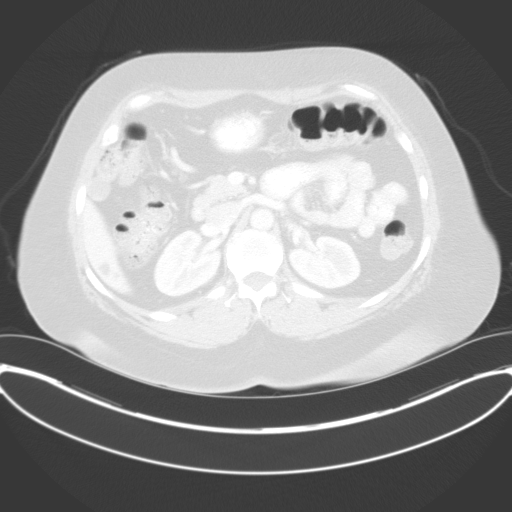
[im 74/90  soft-tissue]
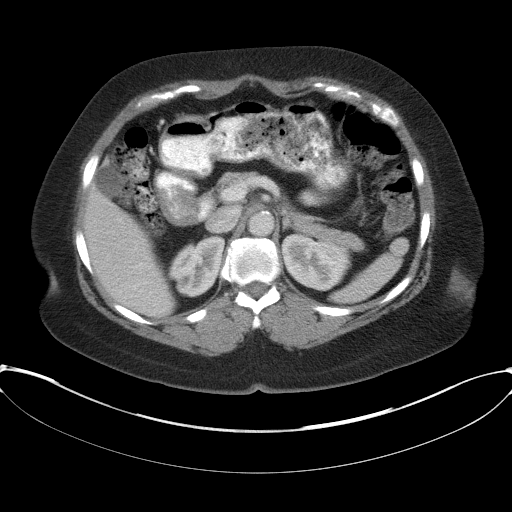
[im 74/90  lung]
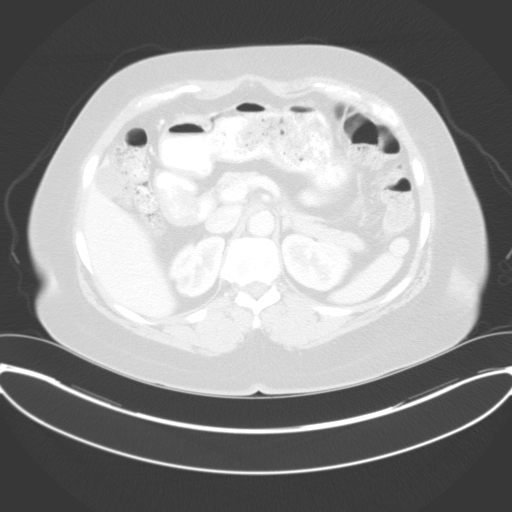
[im 79/90  lung]
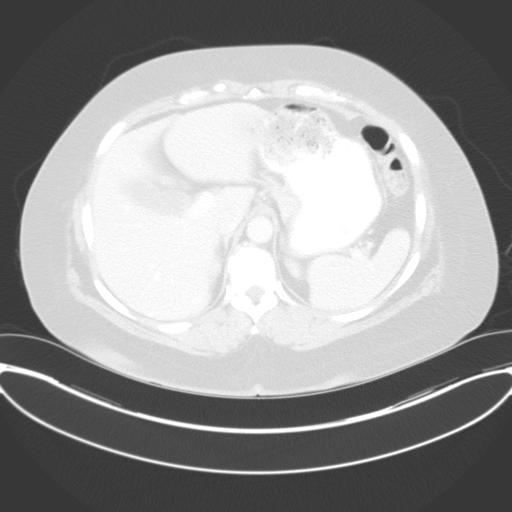
[im 84/90  soft-tissue]
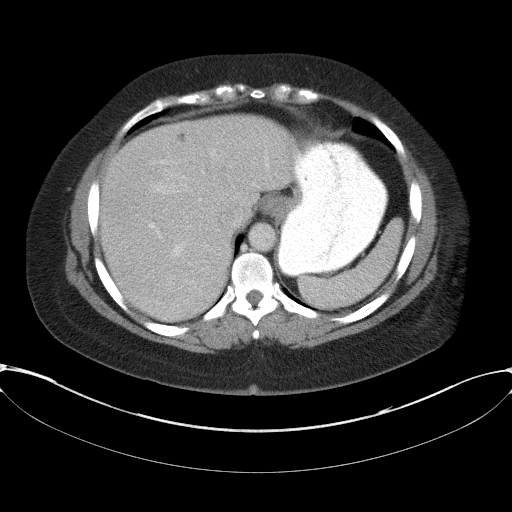
[im 84/90  lung]
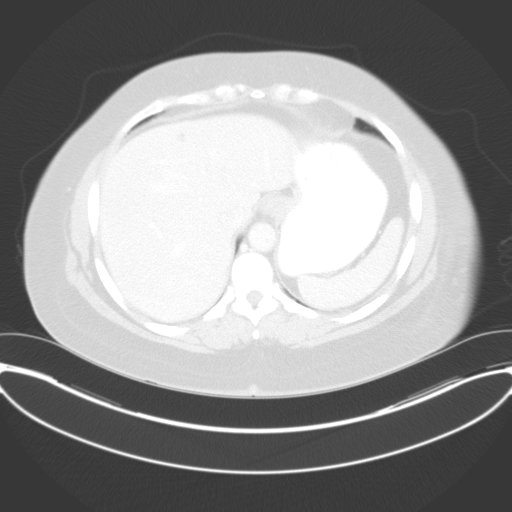
[im 84/90  bone]
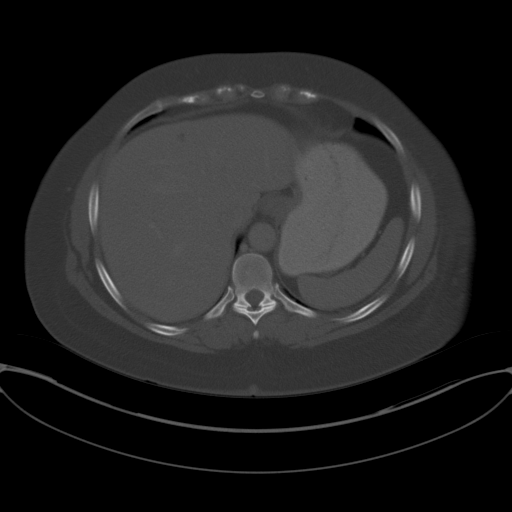

[Series 5: abd/pelvis 3.0 coronal · coronal · 0.78mm/px · 3 of 102 slices shown]
[im 26/102  soft-tissue]
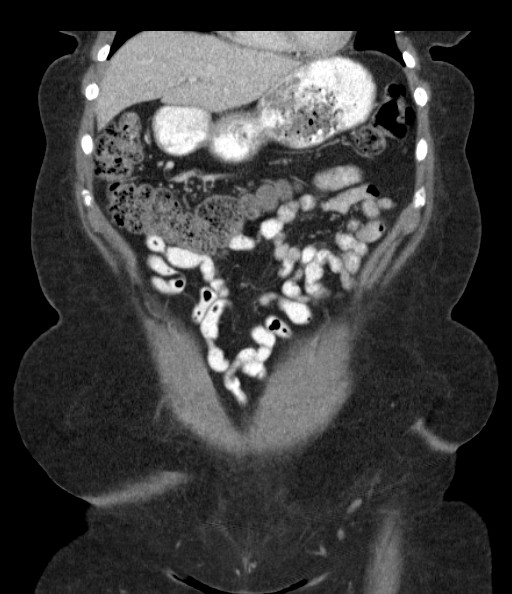
[im 51/102  soft-tissue]
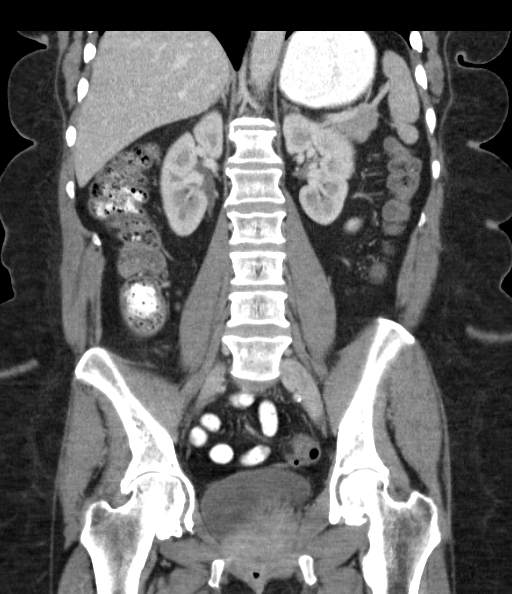
[im 76/102  soft-tissue]
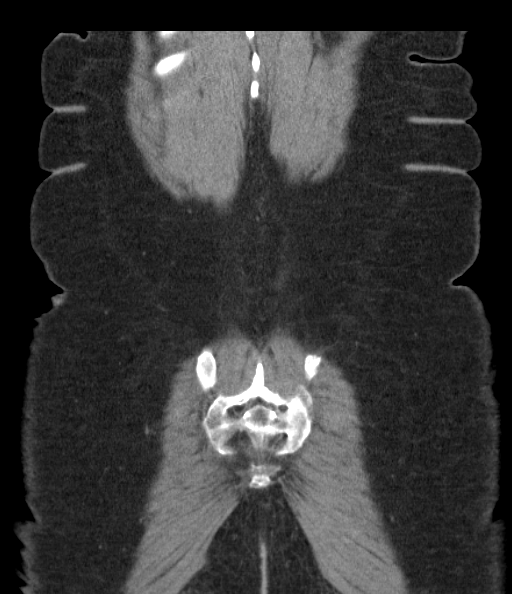

[14 of 46 positions shown; findings below may reference images not displayed]

FINDINGS: The lung bases are clear.  The liver enhances and there
are a few small low attenuation structures noted in the both the
right and the left lobes most likely representing a benign process
such as small cysts.  No calcified gallstones are seen. Small
faintly calcified gallstones layering in the gallbladder fundus
would be difficult to exclude.  The pancreas is normal in size and
the pancreatic duct is not dilated.  The adrenal glands and spleen
are unremarkable with a small splenule present. The duodenal bulb
is not well distended which may be due to peristalsis and retained
food, but a lesion in that region cannot be excluded.  The kidneys
enhance with no calculus or mass and no hydronephrosis is seen.
The abdominal aorta is normal in caliber.  No adenopathy is noted.

The urinary bladder is not well distended but it appears
unremarkable.  The patient has previously undergone hysterectomy
and right oophorectomy.  The left ovary is unremarkable.  No free
fluid is seen within the pelvis.  Fluid and feces is noted
throughout the colon.  The terminal ileum is unremarkable.  The
appendix is normal in size. Advanced degenerative joint disease of
the hips is noted right greater than left. The lumbar vertebrae are
in normal alignment.
IMPRESSION: 1.  Filling defect is noted within the duodenal bulb - proximal
descending duodenum possibly due to peristalsis and retained food.
A lesion in this region cannot be excluded and clinical correlation
is recommended.
2.  Cannot exclude faintly calcified gallstones layering in the
gallbladder fundus.
3.  Advanced degenerative joint disease of the hips, right greater
than left.

## 2015-06-29 ENCOUNTER — Telehealth: Payer: Self-pay | Admitting: Family Medicine

## 2015-06-29 MED ORDER — OXYCODONE HCL 5 MG PO TABS: 5.0000 mg | ORAL_TABLET | Freq: Two times a day (BID) | ORAL | Status: DC | PRN

## 2015-06-29 NOTE — Telephone Encounter (Signed)
OK to change her Oxycodone to 1 tab po bid prn pain and disp #60 instead of #30 but warn her this can continue to happen and we do not want to increase use over and over again, so have her come in if pain worsens and she needs any further changes

## 2015-06-29 NOTE — Telephone Encounter (Signed)
Caller name: Astou Relation to pt: Call back number: [phone] Pharmacy:  Reason for call:   Patient states that she has had to increase oxycodone dosage to two pills a day. She states that one pill is not helping her

## 2015-06-29 NOTE — Telephone Encounter (Signed)
Patient returned phone call. Informed her that rx will be at front desk. Patient is hoping to pickup this afternoon

## 2015-06-29 NOTE — Telephone Encounter (Signed)
Updated medication list and printed prescription. Called the patient left a message to call back

## 2015-06-29 NOTE — Telephone Encounter (Signed)
Called the patient informed of PCP instructions and to pickup hardcopy at the front desk.

## 2015-07-11 IMAGING — US US ABDOMEN COMPLETE
1 series · 14 of 25 positions shown · non-contrast
Comparison: 07/16/2013 the

CLINICAL DATA: Abdominal pain.

ABDOMINAL ULTRASOUND COMPLETE

[Series 1: us abdomen complete · 0.35mm/px · 14 of 76 slices shown]
[im 1/76]
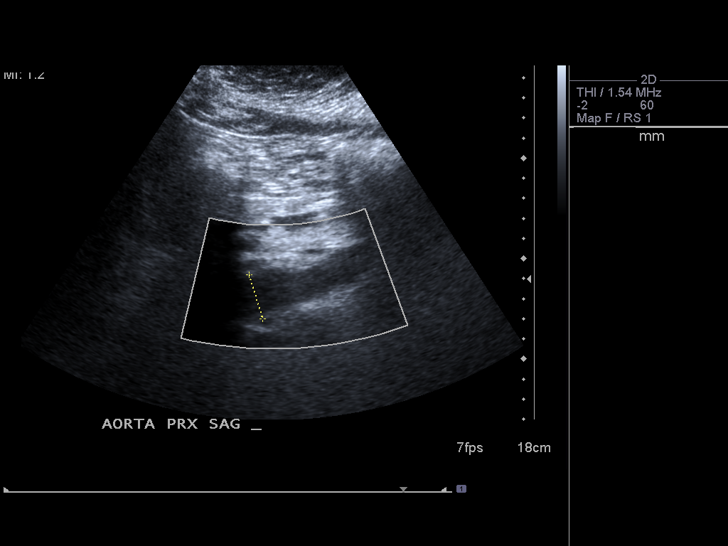
[im 7/76]
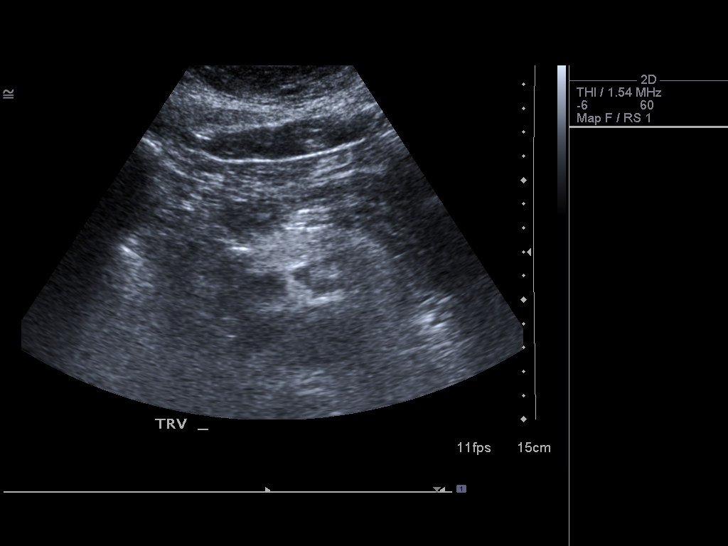
[im 13/76]
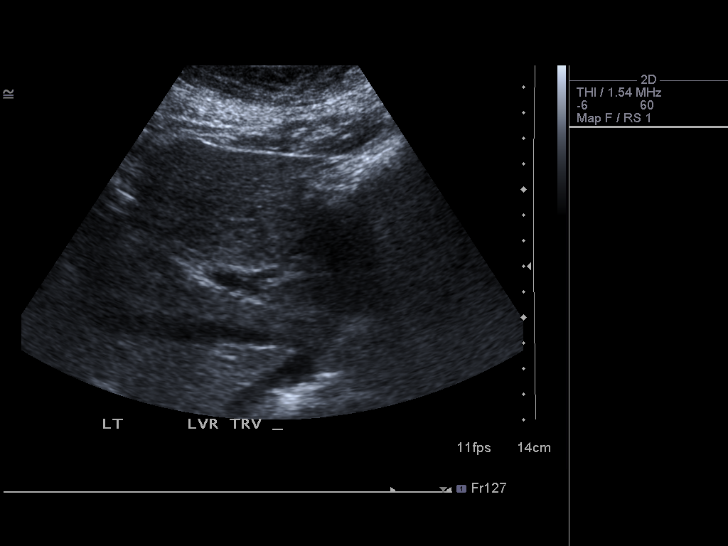
[im 19/76]
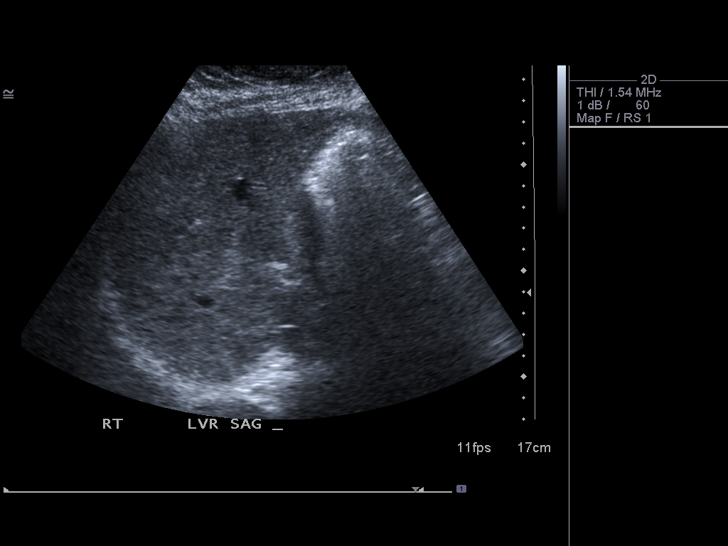
[im 26/76]
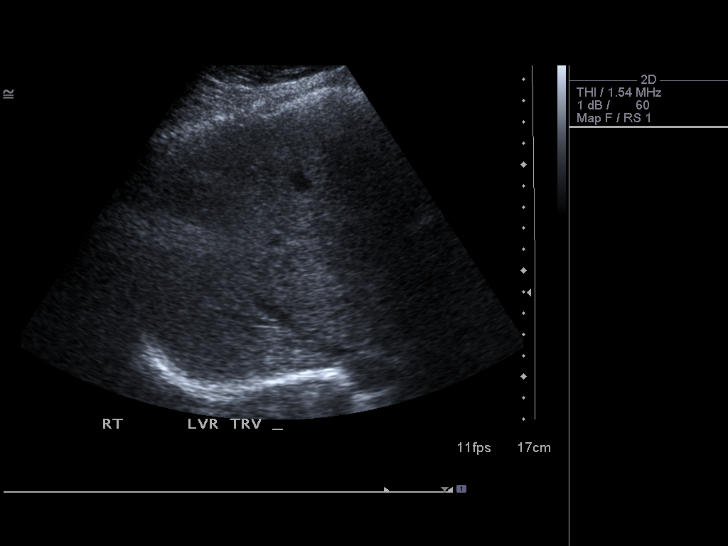
[im 29/76]
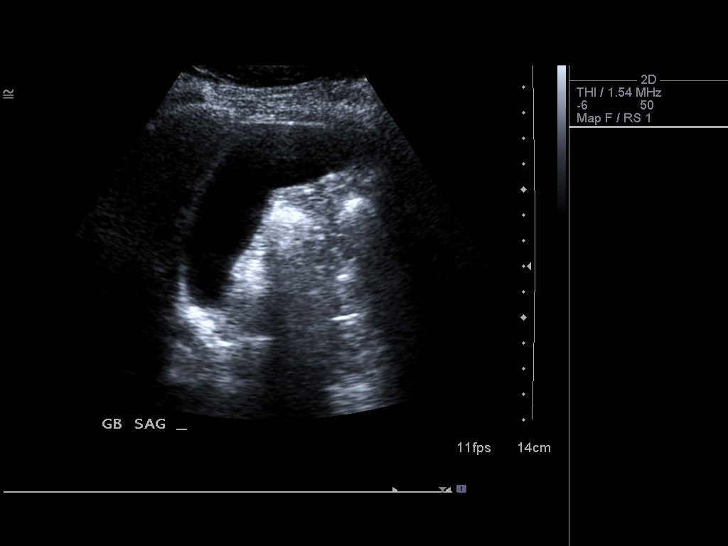
[im 35/76]
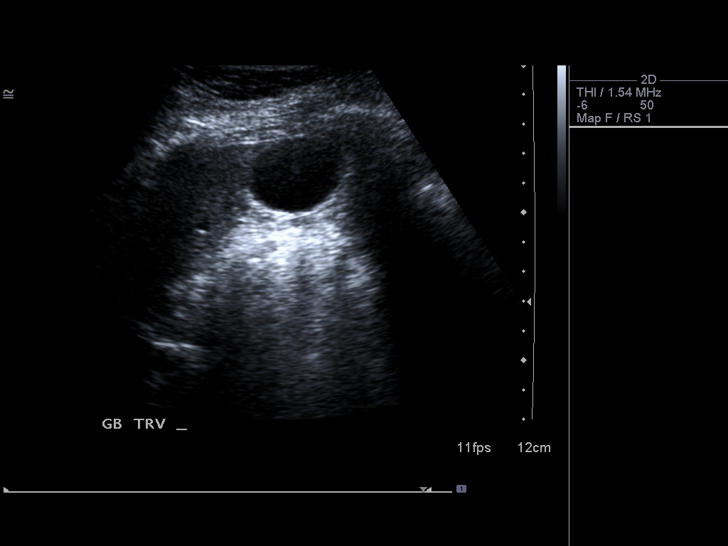
[im 41/76]
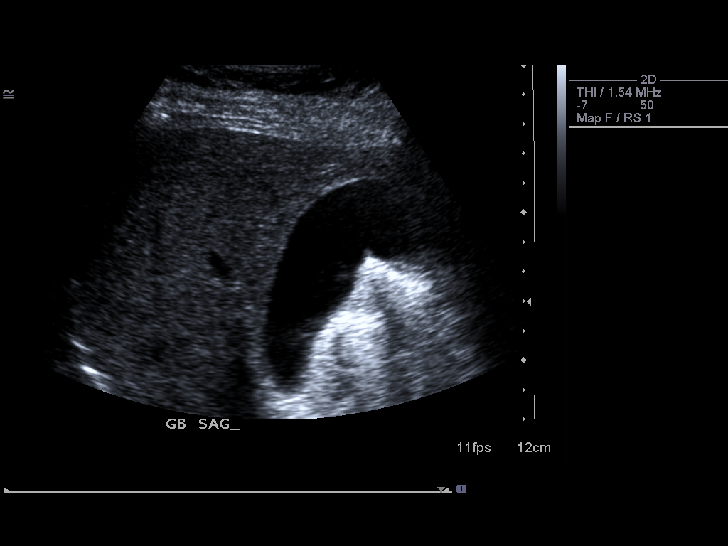
[im 47/76]
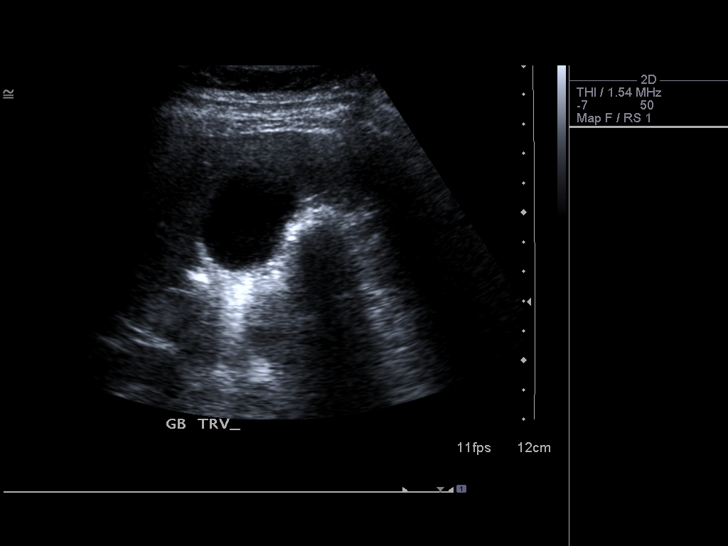
[im 51/76]
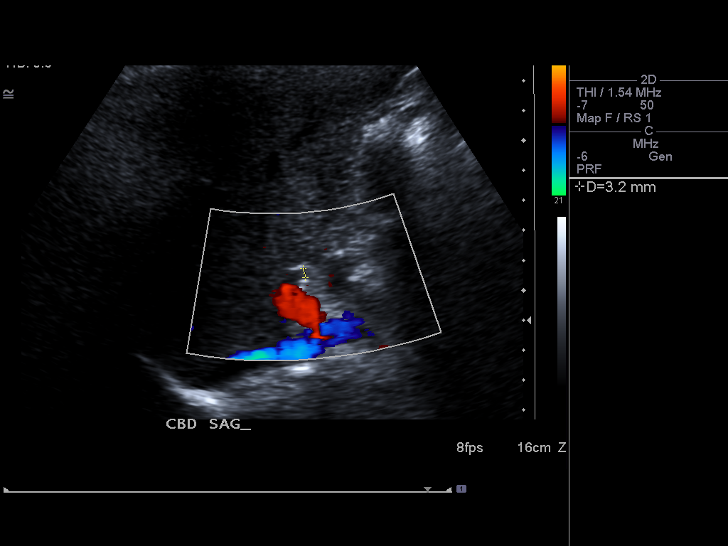
[im 57/76]
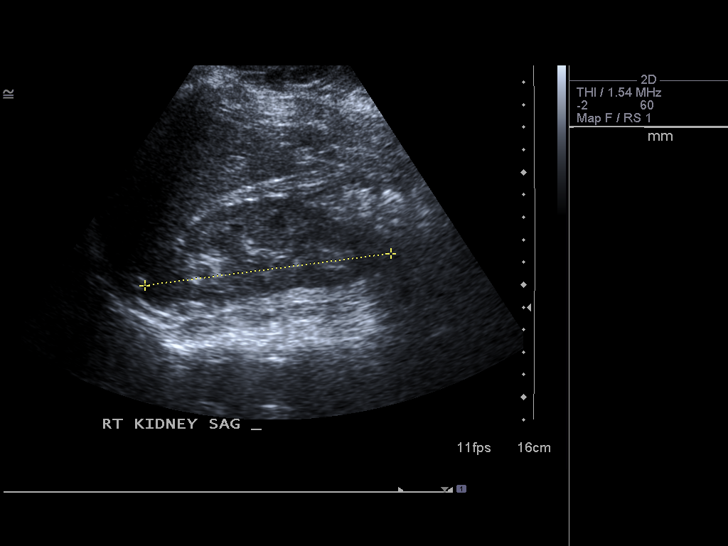
[im 63/76]
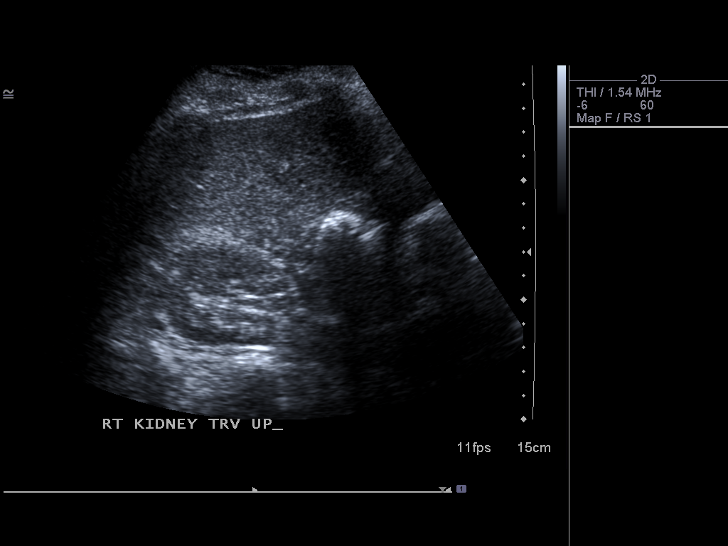
[im 69/76]
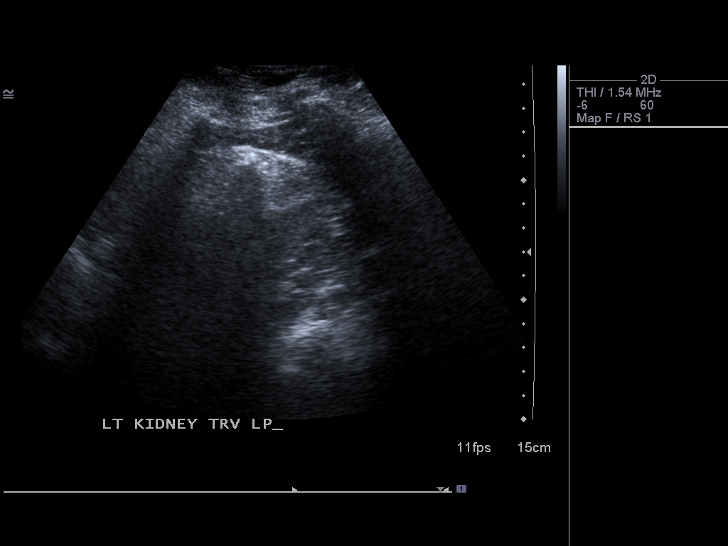
[im 76/76]
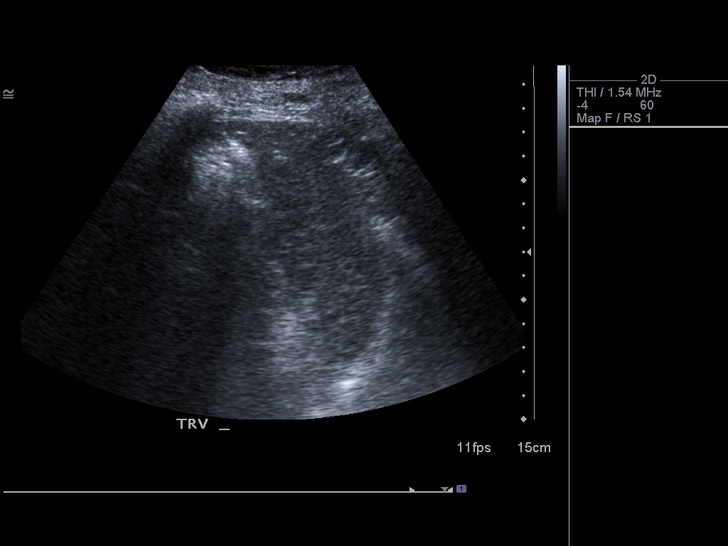

[14 of 25 positions shown; findings below may reference images not displayed]

FINDINGS: Gallbladder:  Small echogenic foci in the gallbladder fundus are
noted which is suspicious for tiny stones. No gallbladder wall
thickening or pericholecystic fluid.  Negative sonographic Murphy's
sign.

Common Bile Duct:  Within normal limits in caliber.

Liver: No focal mass lesion identified.  Within normal limits in
parenchymal echogenicity. Small cyst is identified within the left
hepatic lobe measuring up to 9 mm.

IVC:  Appears normal.

Pancreas:  No abnormality identified.

Spleen:  Within normal limits in size and echotexture.

Right kidney:  Normal in size and parenchymal echogenicity.  No
evidence of mass or hydronephrosis.

Left kidney:  Normal in size and parenchymal echogenicity.  No
evidence of mass or hydronephrosis.

Abdominal Aorta:  No aneurysm identified.
IMPRESSION: 1.  Suspect small stones within the gallbladder fundus.  No
secondary signs of acute cholecystitis.
2.  Liver cyst.

## 2015-07-19 ENCOUNTER — Telehealth: Payer: Self-pay | Admitting: Family Medicine

## 2015-07-19 ENCOUNTER — Other Ambulatory Visit: Payer: Self-pay | Admitting: Family Medicine

## 2015-07-19 DIAGNOSIS — M25559 Pain in unspecified hip: Secondary | ICD-10-CM

## 2015-07-19 DIAGNOSIS — M25519 Pain in unspecified shoulder: Secondary | ICD-10-CM

## 2015-07-19 NOTE — Telephone Encounter (Signed)
Caller name: Justin A Relation to pt: Self Call back number: [phone] Pharmacy:  Reason for call: Pt came in office stating Dr. Ethelene Hal saw her today 07-19-15 and requested pt is needing a referral for shoulder with R. Valma Cava, MD and another referral for hip with Dr. Madlyn Frankel. Charlann Boxer, MD at [facility]. Pt has [facility]. ASAP. Please advise.

## 2015-07-26 ENCOUNTER — Other Ambulatory Visit: Payer: Self-pay | Admitting: Family Medicine

## 2015-07-26 DIAGNOSIS — F411 Generalized anxiety disorder: Secondary | ICD-10-CM

## 2015-07-26 NOTE — Telephone Encounter (Signed)
Ok to refill with same sig, same number 3 rf

## 2015-07-26 NOTE — Telephone Encounter (Signed)
Requesting: Lorazepam Contract 10/13/14 UDS Moderate Last OV  06/15/15 Last Refill  03/30/15   #120 with 3 refills  Please Advise

## 2015-07-27 MED ORDER — LORAZEPAM 2 MG PO TABS: 2.0000 mg | ORAL_TABLET | Freq: Four times a day (QID) | ORAL | Status: DC | PRN

## 2015-07-27 NOTE — Telephone Encounter (Signed)
Printed and on counter for signature.  Once done will fax to local pharmacy, [facility]

## 2015-08-02 ENCOUNTER — Encounter (HOSPITAL_BASED_OUTPATIENT_CLINIC_OR_DEPARTMENT_OTHER): Payer: Self-pay

## 2015-08-02 ENCOUNTER — Emergency Department (HOSPITAL_BASED_OUTPATIENT_CLINIC_OR_DEPARTMENT_OTHER)
Admission: EM | Admit: 2015-08-02 | Discharge: 2015-08-02 | Disposition: A | Discharge status: Home / Self Care | Payer: 59 | Attending: Emergency Medicine | Admitting: Emergency Medicine

## 2015-08-02 DIAGNOSIS — G43909 Migraine, unspecified, not intractable, without status migrainosus: Secondary | ICD-10-CM | POA: Insufficient documentation

## 2015-08-02 DIAGNOSIS — R590 Localized enlarged lymph nodes: Secondary | ICD-10-CM | POA: Insufficient documentation

## 2015-08-02 DIAGNOSIS — L988 Other specified disorders of the skin and subcutaneous tissue: Secondary | ICD-10-CM | POA: Diagnosis present

## 2015-08-02 DIAGNOSIS — Z91040 Latex allergy status: Secondary | ICD-10-CM | POA: Diagnosis not present

## 2015-08-02 DIAGNOSIS — F419 Anxiety disorder, unspecified: Secondary | ICD-10-CM | POA: Insufficient documentation

## 2015-08-02 DIAGNOSIS — L0201 Cutaneous abscess of face: Secondary | ICD-10-CM

## 2015-08-02 DIAGNOSIS — G40909 Epilepsy, unspecified, not intractable, without status epilepticus: Secondary | ICD-10-CM | POA: Diagnosis not present

## 2015-08-02 DIAGNOSIS — L03211 Cellulitis of face: Secondary | ICD-10-CM

## 2015-08-02 DIAGNOSIS — M797 Fibromyalgia: Secondary | ICD-10-CM | POA: Insufficient documentation

## 2015-08-02 DIAGNOSIS — Z79899 Other long term (current) drug therapy: Secondary | ICD-10-CM | POA: Diagnosis not present

## 2015-08-02 DIAGNOSIS — K5900 Constipation, unspecified: Secondary | ICD-10-CM | POA: Diagnosis not present

## 2015-08-02 DIAGNOSIS — Z862 Personal history of diseases of the blood and blood-forming organs and certain disorders involving the immune mechanism: Secondary | ICD-10-CM | POA: Insufficient documentation

## 2015-08-02 DIAGNOSIS — Z8619 Personal history of other infectious and parasitic diseases: Secondary | ICD-10-CM | POA: Diagnosis not present

## 2015-08-02 DIAGNOSIS — F329 Major depressive disorder, single episode, unspecified: Secondary | ICD-10-CM | POA: Diagnosis not present

## 2015-08-02 DIAGNOSIS — E669 Obesity, unspecified: Secondary | ICD-10-CM | POA: Diagnosis not present

## 2015-08-02 DIAGNOSIS — K219 Gastro-esophageal reflux disease without esophagitis: Secondary | ICD-10-CM | POA: Insufficient documentation

## 2015-08-02 MED ORDER — SULFAMETHOXAZOLE-TRIMETHOPRIM 800-160 MG PO TABS: 1.0000 | ORAL_TABLET | Freq: Two times a day (BID) | ORAL | Status: AC

## 2015-08-02 MED ORDER — SULFAMETHOXAZOLE-TRIMETHOPRIM 800-160 MG PO TABS
1.0000 | ORAL_TABLET | Freq: Once | ORAL | Status: AC
  Administered 2015-08-02: 1 via ORAL
  Filled 2015-08-02: qty 1

## 2015-08-02 MED ORDER — OXYCODONE HCL 5 MG PO TABS
5.0000 mg | ORAL_TABLET | Freq: Once | ORAL | Status: AC
  Administered 2015-08-02: 5 mg via ORAL
  Filled 2015-08-02: qty 1

## 2015-08-02 NOTE — ED Notes (Signed)
Hair braided 8/1-c/o pain to front left scalp with pain to ear and side of face

## 2015-08-02 NOTE — Discharge Instructions (Signed)
Please read and follow all provided instructions.  Your diagnoses today include:  1. Facial abscess   2. Facial cellulitis     Tests performed today include:  Vital signs. See below for your results today.   Medications prescribed:   Bactrim (trimethoprim/sulfamethoxazole) - antibiotic  You have been prescribed an antibiotic medicine: take the entire course of medicine even if you are feeling better. Stopping early can cause the antibiotic not to work.  Take any prescribed medications only as directed.   Home care instructions:   Follow any educational materials contained in this packet  Follow-up instructions: Return to the Emergency Department in 48 hours for a recheck if your symptoms are not significantly improved.  Please follow-up with your primary care provider in the next 1 week for further evaluation of your symptoms.   Return instructions:  Return to the Emergency Department if you have:  Fever  Worsening symptoms  Worsening pain  Worsening swelling  Redness of the skin that moves away from the affected area, especially if it streaks away from the affected area   Any other emergent concerns  Your vital signs today were: BP 121/75 mmHg   Pulse 94   Temp(Src) 98.1 F (36.7 C)   Resp 16   Ht  (1.676 m)   Wt 220 lb (99.791 kg)   BMI 35.53 kg/m2   SpO2 99% If your blood pressure (BP) was elevated above 135/85 this visit, please have this repeated by your doctor within one month. --------------

## 2015-08-02 NOTE — ED Provider Notes (Signed)
CSN: [id number]     Arrival date & time 08/02/15  1158 History   First MD Initiated Contact with Patient 08/02/15 1312     Chief Complaint  Patient presents with  . Hair/Scalp Problem     (Consider location/radiation/quality/duration/timing/severity/associated sxs/prior Treatment) HPI Comments: Patient presents with complaint of a bump on her left hairline for approximately one week. Area has been draining pus. She has developed redness on her cheek and swollen tender lumps in her neck. No history of diabetes or immunocompromise. No fever, nausea or vomiting. Husband helped drain the area by squeezing it and noted that pus was expressed.  The history is provided by the patient.    Past Medical History  Diagnosis Date  . Anemia     thalassemia  . Hyperlipidemia     not on any meds   . Seizure disorder x 2    presumed secondary to ativan withdrawal -Dr Sharene Skeans  . Bursitis of right shoulder   . PUD (peptic ulcer disease)     Dr Loreta Ave ( H. Pylori)  . History of colonoscopy 2000  . Chronic insomnia   . Obesity   . Sinusitis, acute 04/09/2013    takes Zyrtec daily  . Allergic state 04/09/2013  . H. pylori infection 07/19/2013  . Cholecystitis, acute 08/30/2013  . Constipation     takes Linzess daily prn  . Anxiety     takes Ativan daily prn  . Nausea     takes Phenergan prn  . Muscle spasms of head and/or neck     takes Norflex daily prn  . Depression     takes Effexor daily  . Peripheral edema     takes Lasix prn  . Complication of anesthesia     hard to sedate  . Fibromyalgia   . Shortness of breath     pt states when Fibromyalgia flares up  . History of bronchitis     last time many yrs ago  . Migraines     takes Topamax daily-last migraine today  . Headache(784.0) 05/22/2013  . Arthritis     sees Dr.Beekman  . Lichen     Dr[facility]Fleisher at [facility]  . H/O hiatal hernia   . GERD (gastroesophageal reflux disease) 06/17/2013    takes Dexilant daily prn  . Hereditary and  idiopathic peripheral neuropathy 03/14/2015  . Adjustment disorder with mixed anxiety and depressed mood 09/28/2009    Qualifier: Diagnosis of  By: Nena Jordan    Past Surgical History  Procedure Laterality Date  . Tonsillectomy  1972  . Abdominal hysterectomy  2000    partial  . Colonoscopy    . Cholecystectomy N/A 09/16/2013    Procedure: LAPAROSCOPIC CHOLECYSTECTOMY;  Surgeon: Emelia Loron, MD;  Location: [facility] OR;  Service: General;  Laterality: N/A;   Family History  Problem Relation Age of Onset  . Cancer      Breast < 2 yo, ,Prostate <50 yo  . Coronary artery disease      <60 yo  . Diabetes    . Diabetes Mother     typer 2  . Heart disease Mother     PVD, PAD  . Cancer Father     prostate, lung, brain mets, smoker  . COPD Father   . Hyperlipidemia Father   . Hypertension Father   . Thyroid disease Father   . Hypertension Sister   . Thyroid disease Sister   . Arthritis Sister   . Diabetes Son 5  type 1  . Diabetes Maternal Grandmother   . Stroke Maternal Grandfather   . Cancer Paternal Grandmother 53    breast  . Alcohol abuse Paternal Grandfather   . Hypertension Sister   . Obesity Sister   . Psoriasis Son    Social History  Substance Use Topics  . Smoking status: Never Smoker   . Smokeless tobacco: Never Used  . Alcohol Use: Yes     Comment: wine rarely   OB History    No data available     Review of Systems  Constitutional: Negative for fever.  Gastrointestinal: Negative for nausea and vomiting.  Skin: Negative for color change.       Positive for abscess.  Hematological: Negative for adenopathy.    Allergies  Augmentin; Dexilant; Hydrocodone-acetaminophen; Levsin; Linzess; Promethazine; Propoxyphene n-acetaminophen; Statins; Acetaminophen; Latex; and Oxycodone-acetaminophen  Home Medications   Prior to Admission medications   Medication Sig Start Date End Date Taking? Authorizing Provider  cetirizine (ZYRTEC) 10 MG tablet Take 1  tablet (10 mg total) by mouth daily as needed for allergies or rhinitis. 06/15/15   Bradd Canary, MD  clobetasol cream (TEMOVATE) 0.05 % Apply 1 application topically 2 (two) times daily. D/C PREVIOUS SCRIPTS FOR THIS MEDICATION 03/30/15   Bradd Canary, MD  cyclobenzaprine (FLEXERIL) 10 MG tablet TAKE 1 TABLET BY MOUTH AT BEDTIME AS NEEDED FOR MUSCLE SPASMS. 04/15/15   Bradd Canary, MD  furosemide (LASIX) 20 MG tablet TAKE 1 TABLET (20 MG TOTAL) BY MOUTH DAILY AS NEEDED. 03/04/15   Bradd Canary, MD  gabapentin (NEURONTIN) 600 MG tablet Take 1 tablet (600 mg total) by mouth 3 (three) times daily. 06/15/15   Bradd Canary, MD  levothyroxine (SYNTHROID, LEVOTHROID) 25 MCG tablet TAKE 1 TABLET BY MOUTH DAILY BEFORE BREAKFAST 06/15/15   Bradd Canary, MD  LORazepam (ATIVAN) 2 MG tablet Take 1 tablet (2 mg total) by mouth every 6 (six) hours as needed for anxiety. 07/27/15   Bradd Canary, MD  mometasone (NASONEX) 50 MCG/ACT nasal spray Place 2 sprays into the nose daily as needed. Allergies 04/08/13   Bradd Canary, MD  NALTREXONE HCL PO Take 4 mg by mouth at bedtime.    Historical Provider, MD  NON FORMULARY ICY HOT SMART RELIEF    Historical Provider, MD  orphenadrine (NORFLEX) 100 MG tablet TAKE 1 TABLET BY MOUTH EVERY MORNING AS NEEDED. 07/26/15   Bradd Canary, MD  oxyCODONE (OXY IR/ROXICODONE) 5 MG immediate release tablet Take 1 tablet (5 mg total) by mouth 2 (two) times daily as needed for severe pain. 06/29/15   Bradd Canary, MD  potassium chloride SA (K-DUR,KLOR-CON) 20 MEQ tablet Take 1 tablet (20 mEq total) by mouth as directed. 03/04/15   Bradd Canary, MD  sulfamethoxazole-trimethoprim (BACTRIM DS,SEPTRA DS) 800-160 MG per tablet Take 1 tablet by mouth 2 (two) times daily. 08/02/15 08/09/15  Renne Crigler, PA-C  SUMAtriptan (IMITREX) 20 MG/ACT nasal spray PLACE 1 SPRAY (20 MG TOTAL) INTO THE NOSE EVERY 2 (TWO) HOURS AS NEEDED FOR MIGRAINE OR HEADACHE. MAX OF 2 DOSES IN 24 HOURS 10/01/14    Bradd Canary, MD  topiramate (TOPAMAX) 50 MG tablet TAKE 1 TABLET BY MOUTH 3 TIMES A DAY 06/15/15   Bradd Canary, MD  triamcinolone cream (KENALOG) 0.5 % Apply 1 application topically 3 (three) times daily. 03/30/15   Bradd Canary, MD  venlafaxine (EFFEXOR) 75 MG tablet Take 1 tablet (75 mg  total) by mouth 3 (three) times daily. 03/30/15   Bradd Canary, MD   BP 121/75 mmHg  Pulse 94  Temp(Src) 98.1 F (36.7 C)  Resp 16  Ht  (1.676 m)  Wt 220 lb (99.791 kg)  BMI 35.53 kg/m2  SpO2 99%   Physical Exam  Constitutional: She appears well-developed and well-nourished.  HENT:  Head: Normocephalic and atraumatic.  Right Ear: External ear normal.  Left Ear: Tympanic membrane, external ear and ear canal normal.  Mouth/Throat: Oropharynx is clear and moist.  Eyes: Conjunctivae are normal.  Neck: Normal range of motion. Neck supple.  Pulmonary/Chest: No respiratory distress.  Lymphadenopathy:       Head (right side): No tonsillar and no preauricular adenopathy present.       Head (left side): Tonsillar and preauricular adenopathy present.    She has cervical adenopathy.       Right cervical: No superficial cervical adenopathy present.      Left cervical: Superficial cervical adenopathy present.  Neurological: She is alert.  Skin: Skin is warm and dry.  1 cm crusting pustule on left hairline, nonfluctuant with associated overlying erythema and erythema extending down anterior to her ear. This is consistent with cellulitis.  Psychiatric: She has a normal mood and affect.  Nursing note and vitals reviewed.   ED Course  Procedures (including critical care time)   2:08 PM Patient seen and examined. Medications ordered.   Vital signs reviewed and are as follows: BP 121/75 mmHg  Pulse 94  Temp(Src) 98.1 F (36.7 C)  Resp 16  Ht  (1.676 m)  Wt 220 lb (99.791 kg)  BMI 35.53 kg/m2  SpO2 99%   The patient was urged to return to the Emergency Department urgently with  worsening pain, swelling, expanding erythema especially if it streaks away from the affected area, fever, or if they have any other concerns.   The patient was instructed to remove packing at home in 48 hours and return to the Emergency Department or go to their PCP at that time for a recheck if their symptoms are not entirely resolved.  The patient verbalized understanding and stated agreement with this plan.     MDM   Final diagnoses:  Facial abscess  Facial cellulitis   Patient with facial abscess/cellulitis. Abscess was previously drained and is nonfluctuant at this point. Do not feel that I&D would be beneficial given the appearance of the abscess. Will start on anti-biotics. No systemic symptoms of illness. Return instructions as above.    Renne Crigler, PA-C 08/02/15 1412  Gerhard Munch, MD 08/02/15 [phone]

## 2015-08-06 ENCOUNTER — Ambulatory Visit: Payer: 59 | Admitting: Family Medicine

## 2015-08-10 ENCOUNTER — Ambulatory Visit: Payer: 59 | Admitting: Family Medicine

## 2015-08-10 ENCOUNTER — Telehealth: Payer: Self-pay | Admitting: Family Medicine

## 2015-08-10 NOTE — Telephone Encounter (Signed)
Pt will be no show today 08/10/15 2:30pm, 8/30 KO pt left VM 8/30 11:39am to resch appt, called pt and no reason given just that she can't make it, RS for 09/24/15, charge for no show?

## 2015-08-11 NOTE — Telephone Encounter (Signed)
No charge, she is enemployed if she no shows again, I will need to charge

## 2015-08-20 ENCOUNTER — Telehealth: Payer: Self-pay | Admitting: Family Medicine

## 2015-08-20 NOTE — Telephone Encounter (Signed)
Requesting:  Oxycodone Contract   Signed on 10/13/14 UDS  Done on 03/30/15  Moderate Last OV   06/15/15 Last Refill    #60 with 0 refills on 06/15/15   Please Advise

## 2015-08-20 NOTE — Telephone Encounter (Signed)
Relation to pt:self °Call back number:[phone] ° ° °Reason for call:  °Patient requesting a refill oxyCODONE (OXY IR/ROXICODONE) 5 MG immediate release tablet  °

## 2015-08-22 NOTE — Telephone Encounter (Signed)
OK to refill Oxycodone with same strength, same sig, #60

## 2015-08-23 MED ORDER — OXYCODONE HCL 5 MG PO TABS: 5.0000 mg | ORAL_TABLET | Freq: Two times a day (BID) | ORAL | Status: DC | PRN

## 2015-08-23 NOTE — Telephone Encounter (Signed)
Rx printed. MD signed.  Placed at front desk for pick-up.  Patient notified.

## 2015-08-26 ENCOUNTER — Other Ambulatory Visit: Payer: Self-pay | Admitting: Family Medicine

## 2015-08-27 NOTE — Telephone Encounter (Signed)
LOV 06/15/15  gabapentin (NEURONTIN) 600 MG tablet 600 mg, 3 times daily BLYTH, STACEY A 90 tablet 1 ordered Summary: Take 1 tablet (600 mg total) by mouth 3 (three) times daily., Starting 06/15/2015, Until Discontinued, Normal Dose, Route, Frequency: 600 mg, Oral, 3 times daily     Please advise

## 2015-09-20 ENCOUNTER — Telehealth: Payer: Self-pay | Admitting: Family Medicine

## 2015-09-20 MED ORDER — OXYCODONE HCL 5 MG PO TABS: 5.0000 mg | ORAL_TABLET | Freq: Two times a day (BID) | ORAL | Status: DC | PRN

## 2015-09-20 NOTE — Telephone Encounter (Signed)
OK to refill

## 2015-09-20 NOTE — Telephone Encounter (Signed)
Relation to WJ:XBJY Call back [phone]   Reason for call:  Patient requesting a refill oxyCODONE (OXY IR/ROXICODONE) 5 MG immediate release tablet

## 2015-09-20 NOTE — Telephone Encounter (Signed)
Patient informed to pickup hardcopy of oxycodone and to do UDS as well.

## 2015-09-20 NOTE — Telephone Encounter (Signed)
Requesting: Oxycodone Contract  10/13/14 UDS  03/30/15 Moderate, next due now Last OV  06/15/15 Last Refill   #60 with 0 refills 08/23/15  Please Advise

## 2015-09-20 NOTE — Telephone Encounter (Signed)
Printed and on counter for signature. 

## 2015-09-21 ENCOUNTER — Other Ambulatory Visit: Payer: Self-pay | Admitting: Family Medicine

## 2015-09-24 ENCOUNTER — Encounter: Payer: Self-pay | Admitting: Family Medicine

## 2015-09-24 ENCOUNTER — Ambulatory Visit (INDEPENDENT_AMBULATORY_CARE_PROVIDER_SITE_OTHER): Payer: 59 | Admitting: Family Medicine

## 2015-09-24 VITALS — BP 134/77 | HR 93 | Temp 98.2°F | Ht 66.0 in | Wt 229.4 lb

## 2015-09-24 DIAGNOSIS — D649 Anemia, unspecified: Secondary | ICD-10-CM | POA: Diagnosis not present

## 2015-09-24 DIAGNOSIS — E782 Mixed hyperlipidemia: Secondary | ICD-10-CM

## 2015-09-24 DIAGNOSIS — K219 Gastro-esophageal reflux disease without esophagitis: Secondary | ICD-10-CM | POA: Diagnosis not present

## 2015-09-24 DIAGNOSIS — E669 Obesity, unspecified: Secondary | ICD-10-CM

## 2015-09-24 DIAGNOSIS — E039 Hypothyroidism, unspecified: Secondary | ICD-10-CM | POA: Diagnosis not present

## 2015-09-24 DIAGNOSIS — M549 Dorsalgia, unspecified: Secondary | ICD-10-CM

## 2015-09-24 NOTE — Progress Notes (Signed)
Pre visit review using our clinic review tool, if applicable. No additional management support is needed unless otherwise documented below in the visit note. 

## 2015-09-24 NOTE — Patient Instructions (Signed)

## 2015-10-03 ENCOUNTER — Encounter: Payer: Self-pay | Admitting: Family Medicine

## 2015-10-03 NOTE — Assessment & Plan Note (Signed)
And neck pain is set up with neurosurg and sees pain management, dr Ethelene Halamos, has received some injections with pain relief. Encouraged moist heat and gentle stretching as tolerated. May try NSAIDs and prescription meds as directed and report if symptoms worsen or seek immediate care

## 2015-10-03 NOTE — Progress Notes (Signed)
Subjective:    Patient ID: Heather Hanson, female    DOB: [birth year], 54 y.o.   MRN: [id number]  Chief Complaint  Patient presents with  . Follow-up    HPI Patient is in today for follow-up. Continues to struggle with chronic back and neck pain but no recent injury or trauma. Is following with pain management and reestablishing with neural surgery. No recent illness. No injury or fall. Continues to struggle with ongoing stressors but no new complaints. Denies CP/palp/SOB/HA/congestion/fevers/GI or GU c/o. Taking meds as prescribed  Past Medical History  Diagnosis Date  . Anemia     thalassemia  . Hyperlipidemia     not on any meds   . Seizure disorder (HCC) x 2    presumed secondary to ativan withdrawal -Dr Sharene Skeans  . Bursitis of right shoulder   . PUD (peptic ulcer disease)     Dr Loreta Ave ( H. Pylori)  . History of colonoscopy 2000  . Chronic insomnia   . Obesity   . Sinusitis, acute 04/09/2013    takes Zyrtec daily  . Allergic state 04/09/2013  . H. pylori infection 07/19/2013  . Cholecystitis, acute 08/30/2013  . Constipation     takes Linzess daily prn  . Anxiety     takes Ativan daily prn  . Nausea     takes Phenergan prn  . Muscle spasms of head and/or neck     takes Norflex daily prn  . Depression     takes Effexor daily  . Peripheral edema     takes Lasix prn  . Complication of anesthesia     hard to sedate  . Fibromyalgia   . Shortness of breath     pt states when Fibromyalgia flares up  . History of bronchitis     last time many yrs ago  . Migraines     takes Topamax daily-last migraine today  . Headache(784.0) 05/22/2013  . Arthritis     sees Dr.Beekman  . Lichen     Dr[facility]Fleisher at [facility]  . H/O hiatal hernia   . GERD (gastroesophageal reflux disease) 06/17/2013    takes Dexilant daily prn  . Hereditary and idiopathic peripheral neuropathy 03/14/2015  . Adjustment disorder with mixed anxiety and depressed mood 09/28/2009    Qualifier: Diagnosis of   By: Nena Jordan     Past Surgical History  Procedure Laterality Date  . Tonsillectomy  1972  . Abdominal hysterectomy  2000    partial  . Colonoscopy    . Cholecystectomy N/A 09/16/2013    Procedure: LAPAROSCOPIC CHOLECYSTECTOMY;  Surgeon: Emelia Loron, MD;  Location: [facility] OR;  Service: General;  Laterality: N/A;    Family History  Problem Relation Age of Onset  . Cancer      Breast < 18 yo, ,Prostate <50 yo  . Coronary artery disease      <60 yo  . Diabetes    . Diabetes Mother     typer 2  . Heart disease Mother     PVD, PAD  . Cancer Father     prostate, lung, brain mets, smoker  . COPD Father   . Hyperlipidemia Father   . Hypertension Father   . Thyroid disease Father   . Hypertension Sister   . Thyroid disease Sister   . Arthritis Sister   . Diabetes Son 16    type 1  . Diabetes Maternal Grandmother   . Stroke Maternal Grandfather   . Cancer Paternal Grandmother  85    breast  . Alcohol abuse Paternal Grandfather   . Hypertension Sister   . Obesity Sister   . Psoriasis Son     Social History   Social History  . Marital Status: Divorced    Spouse Name: N/A  . Number of Children: 2  . Years of Education: N/A   Occupational History  . 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 Smoking status: Never Smoker   . Smokeless tobacco: Never Used  . Alcohol Use: Yes     Comment: wine rarely  . Drug Use: No  . Sexual Activity: Yes    Birth Control/ Protection: Surgical     Comment: lives with,    Other Topics Concern  . Not on file   Social History Narrative   Occupation: Water quality scientist   Divorced     2 sons 26, 44      Never Smoked     Alcohol use-no       Outpatient Prescriptions Prior to Visit  Medication Sig Dispense Refill  . cetirizine (ZYRTEC) 10 MG tablet Take 1 tablet (10 mg total) by mouth daily as needed for allergies or rhinitis. 100 tablet 1  . cyclobenzaprine (FLEXERIL) 10 MG tablet TAKE 1 TABLET BY MOUTH AT  BEDTIME AS NEEDED FOR MUSCLE SPASMS. 30 tablet 6  . furosemide (LASIX) 20 MG tablet TAKE 1 TABLET (20 MG TOTAL) BY MOUTH DAILY AS NEEDED. 30 tablet 6  . gabapentin (NEURONTIN) 600 MG tablet TAKE 1 TABLET (600 MG TOTAL) BY MOUTH 3 (THREE) TIMES DAILY. 90 tablet 1  . levothyroxine (SYNTHROID, LEVOTHROID) 25 MCG tablet TAKE 1 TABLET BY MOUTH DAILY BEFORE BREAKFAST 30 tablet 6  . LORazepam (ATIVAN) 2 MG tablet Take 1 tablet (2 mg total) by mouth every 6 (six) hours as needed for anxiety. 120 tablet 3  . mometasone (NASONEX) 50 MCG/ACT nasal spray Place 2 sprays into the nose daily as needed. Allergies    . NALTREXONE HCL PO Take 4 mg by mouth at bedtime.    . NON FORMULARY ICY HOT SMART RELIEF    . orphenadrine (NORFLEX) 100 MG tablet TAKE 1 TABLET BY MOUTH EVERY MORNING AS NEEDED. 30 tablet 1  . oxyCODONE (OXY IR/ROXICODONE) 5 MG immediate release tablet Take 1 tablet (5 mg total) by mouth 2 (two) times daily as needed for severe pain. 60 tablet 0  . potassium chloride SA (K-DUR,KLOR-CON) 20 MEQ tablet Take 1 tablet (20 mEq total) by mouth as directed. 30 tablet 6  . SUMAtriptan (IMITREX) 20 MG/ACT nasal spray PLACE 1 SPRAY (20 MG TOTAL) INTO THE NOSE EVERY 2 (TWO) HOURS AS NEEDED FOR MIGRAINE OR HEADACHE. MAX OF 2 DOSES IN 24 HOURS 1 Inhaler 1  . topiramate (TOPAMAX) 50 MG tablet TAKE 1 TABLET BY MOUTH 3 TIMES A DAY 90 tablet 2  . triamcinolone cream (KENALOG) 0.5 % Apply 1 application topically 3 (three) times daily. 30 g 5  . venlafaxine (EFFEXOR) 75 MG tablet Take 1 tablet (75 mg total) by mouth 3 (three) times daily. 90 tablet 5  . clobetasol cream (TEMOVATE) 0.05 % Apply 1 application topically 2 (two) times daily. D/C PREVIOUS SCRIPTS FOR THIS MEDICATION 30 g 6   No facility-administered medications prior to visit.    Allergies  Allergen Reactions  . Augmentin [Amoxicillin-Pot Clavulanate] Other (See Comments)    Blisters in the back of throat  . Dexilant [Dexlansoprazole] Other (See  Comments)  Bowel urgency and liquid stools  . Hydrocodone-Acetaminophen Hives    REACTION: Rash  . Levsin [Hyoscyamine Sulfate] Other (See Comments)    Projectile vomitting  . Linzess [Linaclotide] Other (See Comments)    Bowel urgency and liquid stools  . Promethazine Nausea And Vomiting  . Propoxyphene N-Acetaminophen Itching    REACTION: rash  . Statins Other (See Comments)    myalgia  . Acetaminophen Itching and Rash  . Latex Itching and Rash    Rash-looks like she has the measles per pt  . Oxycodone-Acetaminophen Itching and Rash    REACTION: Rash    Review of Systems  Constitutional: Positive for malaise/fatigue. Negative for fever.  HENT: Negative for congestion.   Eyes: Negative for discharge.  Respiratory: Negative for shortness of breath.   Cardiovascular: Negative for chest pain, palpitations and leg swelling.  Gastrointestinal: Negative for nausea and abdominal pain.  Genitourinary: Negative for dysuria.  Musculoskeletal: Positive for back pain and neck pain. Negative for falls.  Skin: Negative for rash.  Neurological: Negative for loss of consciousness and headaches.  Endo/Heme/Allergies: Negative for environmental allergies.  Psychiatric/Behavioral: Positive for depression. The patient is not nervous/anxious.        Objective:    Physical Exam  Constitutional: She is oriented to person, place, and time. She appears well-developed and well-nourished. No distress.  HENT:  Head: Normocephalic and atraumatic.  Nose: Nose normal.  Eyes: Right eye exhibits no discharge. Left eye exhibits no discharge.  Neck: Normal range of motion. Neck supple.  Cardiovascular: Normal rate and regular rhythm.   No murmur heard. Pulmonary/Chest: Effort normal and breath sounds normal.  Abdominal: Soft. Bowel sounds are normal. There is no tenderness.  Musculoskeletal: She exhibits no edema.  Neurological: She is alert and oriented to person, place, and time.  Skin: Skin is  warm and dry.  Psychiatric: She has a normal mood and affect.  Nursing note and vitals reviewed.   BP 134/77 mmHg  Pulse 93  Temp(Src) 98.2 F (36.8 C) (Oral)  Ht  (1.676 m)  Wt 229 lb 6.4 oz (104.055 kg)  BMI 37.04 kg/m2  SpO2 100% Wt Readings from Last 3 Encounters:  09/24/15 229 lb 6.4 oz (104.055 kg)  08/02/15 220 lb (99.791 kg)  06/15/15 223 lb 6 oz (101.322 kg)     Lab Results  Component Value Date   WBC 4.2 03/30/2015   HGB 11.8* 03/30/2015   HCT 36.4 03/30/2015   PLT 198.0 03/30/2015   GLUCOSE 75 03/30/2015   CHOL 296* 03/30/2015   TRIG 85.0 03/30/2015   HDL 77.20 03/30/2015   LDLCALC 202* 03/30/2015   ALT 13 03/30/2015   AST 20 03/30/2015   NA 139 03/30/2015   K 3.5 03/30/2015   CL 106 03/30/2015   CREATININE 0.96 03/30/2015   BUN 14 03/30/2015   CO2 28 03/30/2015   TSH 2.84 03/30/2015   HGBA1C 5.6 09/12/2011    Lab Results  Component Value Date   TSH 2.84 03/30/2015   Lab Results  Component Value Date   WBC 4.2 03/30/2015   HGB 11.8* 03/30/2015   HCT 36.4 03/30/2015   MCV 69.9* 03/30/2015   PLT 198.0 03/30/2015   Lab Results  Component Value Date   NA 139 03/30/2015   K 3.5 03/30/2015   CO2 28 03/30/2015   GLUCOSE 75 03/30/2015   BUN 14 03/30/2015   CREATININE 0.96 03/30/2015   BILITOT 0.3 03/30/2015   ALKPHOS 88 03/30/2015   AST 20  03/30/2015   ALT 13 03/30/2015   PROT 6.9 03/30/2015   ALBUMIN 3.9 03/30/2015   CALCIUM 9.0 03/30/2015   GFR 77.37 03/30/2015   Lab Results  Component Value Date   CHOL 296* 03/30/2015   Lab Results  Component Value Date   HDL 77.20 03/30/2015   Lab Results  Component Value Date   LDLCALC 202* 03/30/2015   Lab Results  Component Value Date   TRIG 85.0 03/30/2015   Lab Results  Component Value Date   CHOLHDL 4 03/30/2015   Lab Results  Component Value Date   HGBA1C 5.6 09/12/2011       Assessment & Plan:   Problem List Items Addressed This Visit    Obesity    Encouraged  DASH diet, decrease po intake and increase exercise as tolerated. Needs 7-8 hours of sleep nightly. Avoid trans fats, eat small, frequent meals every 4-5 hours with lean proteins, complex carbs and healthy fats. Minimize simple carbs, GMO foods.      Hypothyroidism    On Levothyroxine, continue to monitor      Relevant Orders   TSH   CBC   Lipid panel   Comprehensive metabolic panel   Hyperlipidemia, mixed - Primary    Encouraged heart healthy diet, increase exercise, avoid trans fats, consider a krill oil cap daily      Relevant Orders   TSH   CBC   Lipid panel   Comprehensive metabolic panel   GERD (gastroesophageal reflux disease)    Avoid offending foods, start probiotics. Do not eat large meals in late evening and consider raising head of bed.       Relevant Orders   TSH   CBC   Lipid panel   Comprehensive metabolic panel   Back pain    And neck pain is set up with neurosurg and sees pain management, dr Ethelene Halamos, has received some injections with pain relief. Encouraged moist heat and gentle stretching as tolerated. May try NSAIDs and prescription meds as directed and report if symptoms worsen or seek immediate care      Relevant Orders   Rheumatoid Factor   Anemia   Relevant Orders   TSH   CBC   Lipid panel   Comprehensive metabolic panel      I have discontinued Ms. Thunder's clobetasol cream. I am also having her maintain her NALTREXONE HCL PO, mometasone, NON FORMULARY, SUMAtriptan, furosemide, potassium chloride SA, triamcinolone cream, venlafaxine, cyclobenzaprine, levothyroxine, cetirizine, topiramate, LORazepam, gabapentin, oxyCODONE, and orphenadrine.  No orders of the defined types were placed in this encounter.     Danise EdgeBLYTH, Yocelyn Brocious, MD

## 2015-10-03 NOTE — Assessment & Plan Note (Signed)
Encouraged DASH diet, decrease po intake and increase exercise as tolerated. Needs 7-8 hours of sleep nightly. Avoid trans fats, eat small, frequent meals every 4-5 hours with lean proteins, complex carbs and healthy fats. Minimize simple carbs, GMO foods. 

## 2015-10-03 NOTE — Assessment & Plan Note (Signed)
On Levothyroxine, continue to monitor 

## 2015-10-03 NOTE — Assessment & Plan Note (Signed)
Avoid offending foods, start probiotics. Do not eat large meals in late evening and consider raising head of bed.  

## 2015-10-03 NOTE — Assessment & Plan Note (Signed)
Encouraged heart healthy diet, increase exercise, avoid trans fats, consider a krill oil cap daily 

## 2015-10-19 ENCOUNTER — Telehealth: Payer: Self-pay

## 2015-10-19 ENCOUNTER — Other Ambulatory Visit: Payer: Self-pay | Admitting: Family Medicine

## 2015-10-19 NOTE — Telephone Encounter (Signed)
OK to fill the Oxycodone

## 2015-10-19 NOTE — Telephone Encounter (Signed)
Requesting: Oxycodone and Fentanyl patches (I do not see on her list) Contract  Singed on 10/13/2014 UDS  09/21/2015  Low risk Last OV  09/24/2015 Last Refill    #60 with 0 refills on 09/20/15  Please Advise

## 2015-10-19 NOTE — Telephone Encounter (Signed)
Called wanting a referral for ENT we referred patient to them they referred patient to ENT. Per patients insurance [facility] is it okay to submit this referral

## 2015-10-19 NOTE — Telephone Encounter (Signed)
I am happy to refer to ENT but need to clarify for what symptoms, please check with patient

## 2015-10-19 NOTE — Telephone Encounter (Signed)
Pt asking for refill on oxycodone. She has 8 pills left. She is asking for increased strength on meds. She states taking 2 pills every 8 hours (sometimes 4/day sometimes 6/day). She is also asking about getting fentanyl patches to help with neck pain. Best # is [phone].

## 2015-10-20 NOTE — Telephone Encounter (Signed)
Spoke with pt, she states ortho has diagnosed her with

## 2015-10-20 NOTE — Telephone Encounter (Signed)
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 She also has a large spur in cervical spine that is pushing against her esophagus. Dr Shon BatonBrooks wants pt to be evaluated by ENT to assess any damage to pt's esophagus prior to doing surgery. Pt has appt with Dr Lazarus SalinesWolicki next Tuesday. [employer] compass requires referral / authorization #. Please advise.

## 2015-10-21 ENCOUNTER — Other Ambulatory Visit: Payer: Self-pay | Admitting: Family Medicine

## 2015-10-21 DIAGNOSIS — J329 Chronic sinusitis, unspecified: Secondary | ICD-10-CM

## 2015-10-21 DIAGNOSIS — M4810 Ankylosing hyperostosis [Forestier], site unspecified: Secondary | ICD-10-CM

## 2015-10-21 MED ORDER — OXYCODONE HCL 5 MG PO TABS: 5.0000 mg | ORAL_TABLET | Freq: Three times a day (TID) | ORAL | Status: DC | PRN

## 2015-10-21 MED ORDER — OXYCODONE HCL 5 MG PO TABS: 5.0000 mg | ORAL_TABLET | Freq: Two times a day (BID) | ORAL | Status: DC | PRN

## 2015-10-21 NOTE — Telephone Encounter (Signed)
Patient informed and will pickup hardcopy in the morning.

## 2015-10-21 NOTE — Telephone Encounter (Signed)
Called the patient to inform PCP did ok to refill oxycodone.   The patient did state she requested fentanyl patches (she did state she knew PCP had never prescribed them for her), but she did  Say the oxy at present strength is not helping her pain and would like to either increase oxy or add patches?? Advise please

## 2015-10-21 NOTE — Telephone Encounter (Signed)
Shredded previous prescription. PCP did ok to change oxycodone to taking 3 per day as needed #90 with 0 refills., but to schedule appointment to discuss if that does not work.  Patient agreed to do so.

## 2015-10-21 NOTE — Telephone Encounter (Signed)
Printed Oxycodone and is on the counter for PCP to sign on 10/22/2015 when back in the office.

## 2015-11-01 ENCOUNTER — Other Ambulatory Visit: Payer: Self-pay | Admitting: Otolaryngology

## 2015-11-01 DIAGNOSIS — R1319 Other dysphagia: Secondary | ICD-10-CM

## 2015-11-02 ENCOUNTER — Ambulatory Visit
Admission: RE | Admit: 2015-11-02 | Discharge: 2015-11-02 | Disposition: A | Discharge status: Home / Self Care | Payer: 59 | Source: Ambulatory Visit | Attending: Otolaryngology | Admitting: Otolaryngology

## 2015-11-02 DIAGNOSIS — R1319 Other dysphagia: Secondary | ICD-10-CM

## 2015-11-16 ENCOUNTER — Other Ambulatory Visit: Payer: Self-pay | Admitting: Orthopedic Surgery

## 2015-11-16 DIAGNOSIS — M5010 Cervical disc disorder with radiculopathy, unspecified cervical region: Secondary | ICD-10-CM

## 2015-11-22 ENCOUNTER — Other Ambulatory Visit: Payer: Self-pay | Admitting: Family Medicine

## 2015-11-22 ENCOUNTER — Ambulatory Visit
Admission: RE | Admit: 2015-11-22 | Discharge: 2015-11-22 | Disposition: A | Discharge status: Home / Self Care | Payer: 59 | Source: Ambulatory Visit | Attending: Orthopedic Surgery | Admitting: Orthopedic Surgery

## 2015-11-22 DIAGNOSIS — M5010 Cervical disc disorder with radiculopathy, unspecified cervical region: Secondary | ICD-10-CM

## 2015-11-22 NOTE — Telephone Encounter (Signed)
Requesting:  Oxycodone Contract   09/21/2015 UDS Low risk Last OV  09/24/2015 Last Refill  #90 with 0 refills on 09/21/2015  Please Advise

## 2015-11-22 NOTE — Telephone Encounter (Signed)
Looks like cyclobenzaprine and Lorazepam were already refilled which is good. OK to refill Oxycodone

## 2015-11-22 NOTE — Telephone Encounter (Signed)
FAXED hardcopy for Ativan to [facility].

## 2015-11-22 NOTE — Telephone Encounter (Signed)
Requesting: Ativan Contract 09/13/2015 UDS Low risk Last OV 09/24/2015 Last Refill  07/27/2015  #120 with 3 refills  Please Advise

## 2015-11-22 NOTE — Telephone Encounter (Signed)
Pt called for refills on lorazepam, cyclobenzaprine, and oxycodone. She would like to pick up the RX for oxycodone tomorrow 11/23/15. She would like the others sent in to pick up tomorrow. Please call [phone].  [facility]

## 2015-11-23 MED ORDER — OXYCODONE HCL 5 MG PO TABS: 5.0000 mg | ORAL_TABLET | Freq: Three times a day (TID) | ORAL | Status: DC | PRN

## 2015-11-23 NOTE — Telephone Encounter (Signed)
Printed oxycodone and called the patient informed can pickup at the front desk today.

## 2015-12-17 ENCOUNTER — Ambulatory Visit: Payer: Self-pay | Admitting: Family Medicine

## 2016-01-11 ENCOUNTER — Telehealth: Payer: Self-pay | Admitting: Family Medicine

## 2016-01-11 NOTE — Telephone Encounter (Signed)
Received call from Dr Shon Baton of [facility]. Patient is in his office saying her pain is so bad she feels suicidal. He has encouraged her to go to [facility] she has refused. He agrees to reiterate they can help her urgently. She may call us for urgent appointment as well and she will seek care if worsens

## 2016-01-12 ENCOUNTER — Telehealth: Payer: Self-pay | Admitting: Family Medicine

## 2016-01-12 NOTE — Telephone Encounter (Signed)
°  Relation to EA:VWUJ Call back number:[phone]   Reason for call:  Patient did not want to elaborate states its personal and would like PCP to follow up

## 2016-01-13 MED FILL — LORazepam 2 MG TABS: 2 | 30 days supply | Qty: 120 | Fill #1

## 2016-01-24 ENCOUNTER — Telehealth: Payer: Self-pay | Admitting: Family Medicine

## 2016-01-24 NOTE — Telephone Encounter (Signed)
Pt has been going to [facility] for her neck and they state there is nothing else they can do for her. She is wanting a referral to someone else. She said she is very upset and doesn't believe there is nothing can be done.

## 2016-01-24 NOTE — Telephone Encounter (Signed)
So unless ortho did an MRI of her neck we should do one, then if there are any findings then we can refer to neurosurgery

## 2016-01-24 NOTE — Telephone Encounter (Signed)
Called left message to call back 

## 2016-01-25 NOTE — Telephone Encounter (Signed)
So I need a copy of that so have her sign a release they did not send it to me. Once I have that I can try a referral to neurosurgery

## 2016-01-25 NOTE — Telephone Encounter (Signed)
Patient informed to sign release at [facility] to release results to PCP in order for her to refer to neurosurgery.  Patient agreed to do so.

## 2016-01-25 NOTE — Telephone Encounter (Signed)
Patient informed [facility]. Did do an MRI of her neck.

## 2016-01-26 ENCOUNTER — Other Ambulatory Visit: Payer: Self-pay | Admitting: Hematology & Oncology

## 2016-01-26 DIAGNOSIS — R112 Nausea with vomiting, unspecified: Secondary | ICD-10-CM

## 2016-01-26 MED ORDER — PROCHLORPERAZINE 25 MG RE SUPP: 25.0000 mg | Freq: Two times a day (BID) | RECTAL | Status: DC | PRN

## 2016-01-26 MED ORDER — METRONIDAZOLE 500 MG PO TABS: 500.0000 mg | ORAL_TABLET | Freq: Three times a day (TID) | ORAL | Status: DC

## 2016-01-26 MED FILL — metroNIDAZOLE 500 MG TABS: 500 | 7 days supply | Qty: 21 | Fill #0

## 2016-02-01 ENCOUNTER — Telehealth: Payer: Self-pay | Admitting: Family Medicine

## 2016-02-01 NOTE — Telephone Encounter (Signed)
LVM inquiring if patient received flu shot  °

## 2016-02-02 ENCOUNTER — Telehealth: Payer: Self-pay | Admitting: Family Medicine

## 2016-02-02 DIAGNOSIS — Z1159 Encounter for screening for other viral diseases: Secondary | ICD-10-CM

## 2016-02-02 DIAGNOSIS — Z114 Encounter for screening for human immunodeficiency virus [HIV]: Secondary | ICD-10-CM

## 2016-02-02 MED FILL — TOPIRAMATE 50 MG TABLET: 50 | 30 days supply | Qty: 90 | Fill #2

## 2016-02-02 MED FILL — CYCLOBENZAPRINE 10 MG TAB: 10 | 30 days supply | Qty: 30 | Fill #1

## 2016-02-02 MED FILL — ALL DAY ALLERGY 10 MG TAB: 10 | 34 days supply | Qty: 34 | Fill #0

## 2016-02-02 MED FILL — VENLAFAXINE HCL 75 MG TAB: 75 | 30 days supply | Qty: 90 | Fill #3

## 2016-02-02 MED FILL — ORPHENADRINE 100 MG TAB SA: 100 | 30 days supply | Qty: 30 | Fill #1

## 2016-02-02 MED FILL — LEVOTHYROXINE 25 MCG TABLET: 25 | 30 days supply | Qty: 30 | Fill #3

## 2016-02-02 NOTE — Telephone Encounter (Signed)
OK to refill both meds with same sig, same number, 1 extra refill on Gabapentin

## 2016-02-02 NOTE — Telephone Encounter (Signed)
Patient requesting refill on oxycodone 5 mg and gabapentin  Last OV 09/24/15 Last UDS 09/21/15 low risk #90 with 0 rf

## 2016-02-02 NOTE — Telephone Encounter (Signed)
Caller name: Self  Can be reached: [phone] Pharmacy:  [facility] - [place], [place] - [address] [phone] (Phone) [phone] (Fax)         Reason for call: Refill on oxyCODONE (OXY IR/ROXICODONE) 5 MG immediate release tablet [[id number]] and gabapentin (NEURONTIN) 600 MG tablet [[id number]]    FYI: Patient would also like to have a Hep C and HIV added to the labs she has ordered. She will get labs drawn tomorrow.

## 2016-02-03 ENCOUNTER — Other Ambulatory Visit: Payer: Self-pay

## 2016-02-03 MED ORDER — OXYCODONE HCL 5 MG PO TABS: 5.0000 mg | ORAL_TABLET | Freq: Three times a day (TID) | ORAL | Status: DC | PRN

## 2016-02-03 MED ORDER — GABAPENTIN 600 MG PO TABS: ORAL_TABLET | ORAL | Status: DC

## 2016-02-03 MED FILL — GABAPENTIN 600 MG TABLET: 600 | 30 days supply | Qty: 90 | Fill #0

## 2016-02-03 MED FILL — oxyCODONE HCL 5 MG TABS: 5 | 30 days supply | Qty: 90 | Fill #0

## 2016-02-03 NOTE — Telephone Encounter (Signed)
Called left detailed message hardcopy is at the front desk to pickup.

## 2016-02-03 NOTE — Addendum Note (Signed)
Addended by: Erin Hearing on: 02/03/2016 08:26 AM   Modules accepted: Orders, Medications

## 2016-02-03 NOTE — Telephone Encounter (Signed)
Refilled patient oxy codone #90 with 0 rf and refilled patients gabapentin  with 1rf

## 2016-02-09 ENCOUNTER — Telehealth: Payer: Self-pay | Admitting: Family Medicine

## 2016-02-09 NOTE — Telephone Encounter (Signed)
Pt needing referral to [facility], for pain mgmt with Dr. Ethelene Hal.  Routine follow up and injections for neck pain. ICD10 M48.10, 721.6, 722.91 Pt now has Medicaid ID # [id number] O.

## 2016-02-10 ENCOUNTER — Other Ambulatory Visit: Payer: Self-pay | Admitting: Family Medicine

## 2016-02-10 DIAGNOSIS — M542 Cervicalgia: Secondary | ICD-10-CM

## 2016-02-10 NOTE — Telephone Encounter (Signed)
Pt has [employer], we are unable to refer this patient. I did make patient aware that our office does not participate with [employer], told her to contact her social worker to see if she could get it switched to [facility]. Will be able to refer once that is switched.

## 2016-02-15 MED FILL — LORazepam 2 MG TABS: 2 | 30 days supply | Qty: 120 | Fill #2

## 2016-02-16 ENCOUNTER — Encounter: Payer: Self-pay | Admitting: Family Medicine

## 2016-02-24 ENCOUNTER — Ambulatory Visit: Payer: Self-pay | Admitting: Family Medicine

## 2016-02-28 ENCOUNTER — Encounter: Payer: Self-pay | Admitting: Family Medicine

## 2016-02-29 ENCOUNTER — Other Ambulatory Visit: Payer: Self-pay | Admitting: Family Medicine

## 2016-02-29 MED ORDER — OXYCODONE HCL 5 MG PO TABS: 5.0000 mg | ORAL_TABLET | Freq: Three times a day (TID) | ORAL | Status: DC | PRN

## 2016-02-29 MED FILL — LEVOTHYROXINE 25 MCG TABLET: 25 | 30 days supply | Qty: 30 | Fill #4

## 2016-02-29 MED FILL — ALL DAY ALLERGY 10 MG TAB: 10 | 34 days supply | Qty: 34 | Fill #1

## 2016-02-29 MED FILL — TOPIRAMATE 50 MG TABLET: 50 | 30 days supply | Qty: 90 | Fill #0

## 2016-02-29 MED FILL — VENLAFAXINE HCL 75 MG TAB: 75 | 30 days supply | Qty: 90 | Fill #1

## 2016-02-29 MED FILL — CYCLOBENZAPRINE 10 MG TAB: 10 | 30 days supply | Qty: 30 | Fill #2

## 2016-02-29 MED FILL — ORPHENADRINE 100 MG TAB SA: 100 | 30 days supply | Qty: 30 | Fill #0

## 2016-03-01 MED FILL — oxyCODONE HCL 5 MG TABS: 5 | 30 days supply | Qty: 90 | Fill #0

## 2016-03-14 MED FILL — LORazepam 2 MG TABS: 2 | 30 days supply | Qty: 120 | Fill #3

## 2016-03-29 ENCOUNTER — Encounter: Payer: Self-pay | Admitting: Family Medicine

## 2016-03-30 ENCOUNTER — Other Ambulatory Visit: Payer: Self-pay | Admitting: Family Medicine

## 2016-03-30 MED ORDER — LEVOTHYROXINE SODIUM 25 MCG PO TABS: ORAL_TABLET | ORAL | Status: DC

## 2016-03-30 MED FILL — metroNIDAZOLE 500 MG TABS: 500 | 7 days supply | Qty: 21 | Fill #1

## 2016-03-30 MED FILL — ALL DAY ALLERGY 10 MG TAB: 10 | 34 days supply | Qty: 34 | Fill #2

## 2016-03-30 MED FILL — LEVOTHYROXINE 25 MCG TABLET: 25 | 30 days supply | Qty: 30 | Fill #0

## 2016-03-30 MED FILL — TOPIRAMATE 50 MG TABLET: 50 | 30 days supply | Qty: 90 | Fill #1

## 2016-03-30 MED FILL — CYCLOBENZAPRINE 10 MG TAB: 10 | 30 days supply | Qty: 30 | Fill #3

## 2016-04-11 MED FILL — LORazepam 2 MG TABS: 2 | 30 days supply | Qty: 120 | Fill #0

## 2016-04-11 MED FILL — oxyCODONE HCL 5 MG TABS: 5 | 30 days supply | Qty: 90 | Fill #0

## 2016-04-11 MED FILL — ORPHENADRINE 100 MG TAB SA: 100 | 30 days supply | Qty: 30 | Fill #1

## 2016-05-02 ENCOUNTER — Other Ambulatory Visit: Payer: Self-pay | Admitting: Hematology & Oncology

## 2016-05-02 DIAGNOSIS — J209 Acute bronchitis, unspecified: Secondary | ICD-10-CM

## 2016-05-02 MED ORDER — HYDROCOD POLST-CPM POLST ER 10-8 MG/5ML PO SUER: 5.0000 mL | Freq: Two times a day (BID) | ORAL | Status: DC | PRN

## 2016-05-02 MED FILL — HYDROCODONE-CHLORPHENIRAM S: 10-8 | 14 days supply | Qty: 140 | Fill #0

## 2016-05-02 MED FILL — CEFDINIR 300 MG CAPSULE: 300 | 7 days supply | Qty: 14 | Fill #0

## 2016-05-02 MED FILL — CYCLOBENZAPRINE 10 MG TAB: 10 | 30 days supply | Qty: 30 | Fill #4

## 2016-05-11 ENCOUNTER — Other Ambulatory Visit: Payer: Self-pay | Admitting: Family Medicine

## 2016-05-11 MED FILL — LEVOTHYROXINE 25 MCG TABLET: 25 | 30 days supply | Qty: 30 | Fill #0

## 2016-05-11 MED FILL — LORazepam 2 MG TABS: 2 | 30 days supply | Qty: 120 | Fill #1

## 2016-05-11 MED FILL — ALL DAY ALLERGY 10 MG TAB: 10 | 34 days supply | Qty: 34 | Fill #3

## 2016-05-11 MED FILL — TOPIRAMATE 50 MG TABLET: 50 | 30 days supply | Qty: 90 | Fill #2

## 2016-05-12 ENCOUNTER — Other Ambulatory Visit: Payer: Self-pay | Admitting: Hematology & Oncology

## 2016-05-12 DIAGNOSIS — J209 Acute bronchitis, unspecified: Secondary | ICD-10-CM

## 2016-05-12 MED ORDER — HYDROCOD POLST-CPM POLST ER 10-8 MG/5ML PO SUER: 5.0000 mL | Freq: Two times a day (BID) | ORAL | Status: DC | PRN

## 2016-05-12 MED FILL — VENLAFAXINE HCL 75 MG TAB: 75 | 30 days supply | Qty: 90 | Fill #0

## 2016-05-12 MED FILL — HYDROCODONE-CHLORPHENIRAM S: 10-8 | 14 days supply | Qty: 140 | Fill #0

## 2016-05-15 MED FILL — oxyCODONE HCL 5 MG TABS: 5 | 30 days supply | Qty: 90 | Fill #0

## 2016-05-17 ENCOUNTER — Other Ambulatory Visit (HOSPITAL_BASED_OUTPATIENT_CLINIC_OR_DEPARTMENT_OTHER): Payer: Self-pay | Admitting: Orthopedic Surgery

## 2016-05-17 DIAGNOSIS — M50321 Other cervical disc degeneration at C4-C5 level: Secondary | ICD-10-CM

## 2016-05-20 ENCOUNTER — Ambulatory Visit (HOSPITAL_BASED_OUTPATIENT_CLINIC_OR_DEPARTMENT_OTHER)
Admission: RE | Admit: 2016-05-20 | Discharge: 2016-05-20 | Disposition: A | Discharge status: Home / Self Care | Payer: Medicaid Other | Source: Ambulatory Visit | Attending: Orthopedic Surgery | Admitting: Orthopedic Surgery

## 2016-05-20 DIAGNOSIS — M47892 Other spondylosis, cervical region: Secondary | ICD-10-CM | POA: Insufficient documentation

## 2016-05-20 DIAGNOSIS — M50321 Other cervical disc degeneration at C4-C5 level: Secondary | ICD-10-CM

## 2016-05-30 ENCOUNTER — Ambulatory Visit: Payer: Self-pay | Admitting: Physician Assistant

## 2016-06-05 ENCOUNTER — Encounter (HOSPITAL_COMMUNITY): Payer: Self-pay

## 2016-06-05 ENCOUNTER — Encounter (HOSPITAL_COMMUNITY)
Admission: RE | Admit: 2016-06-05 | Discharge: 2016-06-05 | Disposition: A | Discharge status: Home / Self Care | Payer: Medicaid Other | Source: Ambulatory Visit | Attending: Orthopedic Surgery | Admitting: Orthopedic Surgery

## 2016-06-05 DIAGNOSIS — M899 Disorder of bone, unspecified: Secondary | ICD-10-CM | POA: Insufficient documentation

## 2016-06-05 DIAGNOSIS — Z79899 Other long term (current) drug therapy: Secondary | ICD-10-CM | POA: Insufficient documentation

## 2016-06-05 DIAGNOSIS — R569 Unspecified convulsions: Secondary | ICD-10-CM | POA: Insufficient documentation

## 2016-06-05 DIAGNOSIS — K219 Gastro-esophageal reflux disease without esophagitis: Secondary | ICD-10-CM | POA: Insufficient documentation

## 2016-06-05 DIAGNOSIS — E785 Hyperlipidemia, unspecified: Secondary | ICD-10-CM | POA: Diagnosis not present

## 2016-06-05 DIAGNOSIS — E039 Hypothyroidism, unspecified: Secondary | ICD-10-CM | POA: Insufficient documentation

## 2016-06-05 DIAGNOSIS — Z01812 Encounter for preprocedural laboratory examination: Secondary | ICD-10-CM | POA: Diagnosis not present

## 2016-06-05 DIAGNOSIS — Z01818 Encounter for other preprocedural examination: Secondary | ICD-10-CM | POA: Diagnosis present

## 2016-06-05 HISTORY — DX: Hypoglycemia, unspecified: E16.2

## 2016-06-05 HISTORY — DX: Dysphagia, unspecified: R13.10

## 2016-06-05 HISTORY — DX: Nausea with vomiting, unspecified: Z98.890

## 2016-06-05 HISTORY — DX: Nausea with vomiting, unspecified: R11.2

## 2016-06-05 HISTORY — DX: Hypothyroidism, unspecified: E03.9

## 2016-06-05 HISTORY — DX: Unspecified convulsions: R56.9

## 2016-06-05 LAB — CBC
HEMATOCRIT: 39.2 % (ref 36.0–46.0)
Hemoglobin: 12.3 g/dL (ref 12.0–15.0)
MCH: 22.6 pg — AB (ref 26.0–34.0)
MCHC: 31.4 g/dL (ref 30.0–36.0)
MCV: 71.9 fL — ABNORMAL LOW (ref 78.0–100.0)
Platelets: 229 10*3/uL (ref 150–400)
RBC: 5.45 MIL/uL — ABNORMAL HIGH (ref 3.87–5.11)
RDW: 15.7 % — ABNORMAL HIGH (ref 11.5–15.5)
WBC: 6.4 10*3/uL (ref 4.0–10.5)

## 2016-06-05 LAB — COMPREHENSIVE METABOLIC PANEL
ALBUMIN: 3.7 g/dL (ref 3.5–5.0)
ALK PHOS: 90 U/L (ref 38–126)
ALT: 15 U/L (ref 14–54)
AST: 27 U/L (ref 15–41)
Anion gap: 9 (ref 5–15)
BILIRUBIN TOTAL: 0.9 mg/dL (ref 0.3–1.2)
BUN: 12 mg/dL (ref 6–20)
CALCIUM: 9.1 mg/dL (ref 8.9–10.3)
CO2: 22 mmol/L (ref 22–32)
CREATININE: 1.06 mg/dL — AB (ref 0.44–1.00)
Chloride: 109 mmol/L (ref 101–111)
GFR calc non Af Amer: 58 mL/min — ABNORMAL LOW (ref >60)
GLUCOSE: 91 mg/dL (ref 65–99)
Potassium: 4.2 mmol/L (ref 3.5–5.1)
SODIUM: 140 mmol/L (ref 135–145)
TOTAL PROTEIN: 6.8 g/dL (ref 6.5–8.1)

## 2016-06-05 LAB — SURGICAL PCR SCREEN
MRSA, PCR: NEGATIVE
Staphylococcus aureus: NEGATIVE

## 2016-06-05 MED FILL — AMITIZA 24 MCG CAPSULES: 24 | 30 days supply | Qty: 60 | Fill #0

## 2016-06-06 ENCOUNTER — Other Ambulatory Visit: Payer: Self-pay | Admitting: Family Medicine

## 2016-06-06 MED FILL — GABAPENTIN 600 MG TABLET: 600 | 30 days supply | Qty: 90 | Fill #1

## 2016-06-06 MED FILL — LORazepam 2 MG TABS: 2 | 30 days supply | Qty: 120 | Fill #2

## 2016-06-06 MED FILL — VENLAFAXINE HCL 75 MG TAB: 75 | 30 days supply | Qty: 90 | Fill #1

## 2016-06-06 MED FILL — CYCLOBENZAPRINE 10 MG TAB: 10 | 30 days supply | Qty: 30 | Fill #5

## 2016-06-06 MED FILL — LEVOTHYROXINE 25 MCG TABLET: 25 | 30 days supply | Qty: 30 | Fill #1

## 2016-06-06 NOTE — Progress Notes (Signed)
Anesthesia Chart Review:  Pt is a 55 year old female scheduled for removal of exostosis anterior cervical spine on 06/08/2016 with Venita Lickahari Brooks, MD.   PCP is Lerry Linerwight Williams, MD.   PMH includes:  Thalassemia, seizures (suspected due to ativan withdrawal), hypothyroidism, hyperlipidemia, GERD. Never smoker. BMI 34. S/p cholecystectomy 09/16/13.   Medications include: lasix, levothyroxine, ativan, potassium, topiramate.   Preoperative labs reviewed.    If no changes, I anticipate pt can proceed with surgery as scheduled.   Rica Mastngela Jamica Woodyard, FNP-BC [facility]Anesthesiology Phone: [phone] 06/06/2016 1:03 PM

## 2016-06-07 MED ORDER — SODIUM CHLORIDE 0.9 % IV SOLN
1500.0000 mg | INTRAVENOUS | Status: AC
  Administered 2016-06-08: 1500 mg via INTRAVENOUS
  Filled 2016-06-07: qty 1500

## 2016-06-07 NOTE — Anesthesia Preprocedure Evaluation (Addendum)
Anesthesia Evaluation  Patient identified by MRN, date of birth, ID band Patient awake    Reviewed: Allergy & Precautions, H&P , NPO status , Patient's Chart, lab work & pertinent test results  History of Anesthesia Complications (+) PONV  Airway Mallampati: III  TM Distance: >3 FB Neck ROM: Limited    Dental no notable dental hx. (+) Teeth Intact, Dental Advisory Given   Pulmonary neg pulmonary ROS,    Pulmonary exam normal breath sounds clear to auscultation       Cardiovascular negative cardio ROS   Rhythm:Regular Rate:Normal     Neuro/Psych  Headaches, Seizures -, Well Controlled,  Anxiety Depression    GI/Hepatic Neg liver ROS, PUD, GERD  Medicated and Controlled,  Endo/Other  Hypothyroidism   Renal/GU negative Renal ROS  negative genitourinary   Musculoskeletal  (+) Arthritis , Osteoarthritis,  Fibromyalgia -, narcotic dependent  Abdominal   Peds  Hematology negative hematology ROS (+) anemia ,   Anesthesia Other Findings   Reproductive/Obstetrics negative OB ROS                            Anesthesia Physical Anesthesia Plan  ASA: III  Anesthesia Plan: General   Post-op Pain Management:    Induction: Intravenous  Airway Management Planned: Oral ETT and Video Laryngoscope Planned  Additional Equipment:   Intra-op Plan:   Post-operative Plan: Extubation in OR  Informed Consent: I have reviewed the patients History and Physical, chart, labs and discussed the procedure including the risks, benefits and alternatives for the proposed anesthesia with the patient or authorized representative who has indicated his/her understanding and acceptance.   Dental advisory given  Plan Discussed with: CRNA  Anesthesia Plan Comments:        Anesthesia Quick Evaluation

## 2016-06-08 ENCOUNTER — Ambulatory Visit (HOSPITAL_COMMUNITY): Payer: Medicaid Other

## 2016-06-08 ENCOUNTER — Ambulatory Visit (HOSPITAL_COMMUNITY): Payer: Medicaid Other | Admitting: Emergency Medicine

## 2016-06-08 ENCOUNTER — Encounter (HOSPITAL_COMMUNITY): Payer: Self-pay | Admitting: *Deleted

## 2016-06-08 ENCOUNTER — Ambulatory Visit (HOSPITAL_COMMUNITY)
Admission: RE | Admit: 2016-06-08 | Discharge: 2016-06-08 | Disposition: A | Discharge status: Home / Self Care | Payer: Medicaid Other | Source: Ambulatory Visit | Attending: Orthopedic Surgery | Admitting: Orthopedic Surgery

## 2016-06-08 ENCOUNTER — Encounter (HOSPITAL_COMMUNITY)
Admission: RE | Disposition: A | Discharge status: Home / Self Care | Payer: Self-pay | Source: Ambulatory Visit | Attending: Orthopedic Surgery

## 2016-06-08 ENCOUNTER — Other Ambulatory Visit: Payer: Self-pay | Admitting: Hematology & Oncology

## 2016-06-08 ENCOUNTER — Ambulatory Visit (HOSPITAL_COMMUNITY): Payer: Medicaid Other | Admitting: Anesthesiology

## 2016-06-08 DIAGNOSIS — E039 Hypothyroidism, unspecified: Secondary | ICD-10-CM | POA: Diagnosis not present

## 2016-06-08 DIAGNOSIS — Z419 Encounter for procedure for purposes other than remedying health state, unspecified: Secondary | ICD-10-CM

## 2016-06-08 DIAGNOSIS — M1990 Unspecified osteoarthritis, unspecified site: Secondary | ICD-10-CM | POA: Diagnosis not present

## 2016-06-08 DIAGNOSIS — K219 Gastro-esophageal reflux disease without esophagitis: Secondary | ICD-10-CM | POA: Insufficient documentation

## 2016-06-08 DIAGNOSIS — M797 Fibromyalgia: Secondary | ICD-10-CM | POA: Insufficient documentation

## 2016-06-08 DIAGNOSIS — F1120 Opioid dependence, uncomplicated: Secondary | ICD-10-CM | POA: Diagnosis not present

## 2016-06-08 DIAGNOSIS — J209 Acute bronchitis, unspecified: Secondary | ICD-10-CM

## 2016-06-08 DIAGNOSIS — M899 Disorder of bone, unspecified: Secondary | ICD-10-CM | POA: Diagnosis present

## 2016-06-08 HISTORY — PX: REMOVAL OF CERVICAL EXOSTOSIS: SHX6381

## 2016-06-08 SURGERY — EXCISION, EXOSTOSIS, SPINE, CERVICAL
Anesthesia: General | Site: Spine Cervical

## 2016-06-08 MED ORDER — ROCURONIUM BROMIDE 100 MG/10ML IV SOLN
INTRAVENOUS | Status: DC | PRN
  Administered 2016-06-08: 50 mg via INTRAVENOUS

## 2016-06-08 MED ORDER — SUGAMMADEX SODIUM 200 MG/2ML IV SOLN
INTRAVENOUS | Status: DC | PRN
  Administered 2016-06-08: 203.8 mg via INTRAVENOUS

## 2016-06-08 MED ORDER — MENTHOL 3 MG MT LOZG: 1.0000 | LOZENGE | OROMUCOSAL | Status: DC | PRN

## 2016-06-08 MED ORDER — PHENOL 1.4 % MT LIQD: 1.0000 | OROMUCOSAL | Status: DC | PRN

## 2016-06-08 MED ORDER — METHOCARBAMOL 500 MG PO TABS
ORAL_TABLET | ORAL | Status: AC
  Filled 2016-06-08: qty 1

## 2016-06-08 MED ORDER — MIDAZOLAM HCL 2 MG/2ML IJ SOLN
INTRAMUSCULAR | Status: AC
  Filled 2016-06-08: qty 2

## 2016-06-08 MED ORDER — SCOPOLAMINE 1 MG/3DAYS TD PT72
1.0000 | MEDICATED_PATCH | Freq: Once | TRANSDERMAL | Status: DC
  Administered 2016-06-08: 1.5 mg via TRANSDERMAL

## 2016-06-08 MED ORDER — BUPIVACAINE-EPINEPHRINE 0.25% -1:200000 IJ SOLN
INTRAMUSCULAR | Status: DC | PRN
  Administered 2016-06-08: 10 mL

## 2016-06-08 MED ORDER — PHENYLEPHRINE 40 MCG/ML (10ML) SYRINGE FOR IV PUSH (FOR BLOOD PRESSURE SUPPORT)
PREFILLED_SYRINGE | INTRAVENOUS | Status: AC
  Filled 2016-06-08: qty 10

## 2016-06-08 MED ORDER — ONDANSETRON HCL 4 MG PO TABS: 4.0000 mg | ORAL_TABLET | Freq: Three times a day (TID) | ORAL | Status: DC | PRN

## 2016-06-08 MED ORDER — SCOPOLAMINE 1 MG/3DAYS TD PT72
MEDICATED_PATCH | TRANSDERMAL | Status: AC
  Administered 2016-06-08: 1.5 mg via TRANSDERMAL
  Filled 2016-06-08: qty 1

## 2016-06-08 MED ORDER — SUCCINYLCHOLINE CHLORIDE 200 MG/10ML IV SOSY
PREFILLED_SYRINGE | INTRAVENOUS | Status: AC
  Filled 2016-06-08: qty 10

## 2016-06-08 MED ORDER — HYDROMORPHONE HCL 1 MG/ML IJ SOLN
0.2500 mg | INTRAMUSCULAR | Status: DC | PRN
  Administered 2016-06-08 (×2): 0.5 mg via INTRAVENOUS

## 2016-06-08 MED ORDER — HYDROCOD POLST-CPM POLST ER 10-8 MG/5ML PO SUER: 5.0000 mL | Freq: Two times a day (BID) | ORAL | Status: DC | PRN

## 2016-06-08 MED ORDER — OXYCODONE HCL 5 MG PO TABS: 10.0000 mg | ORAL_TABLET | Freq: Four times a day (QID) | ORAL | Status: DC | PRN

## 2016-06-08 MED ORDER — PROMETHAZINE HCL 25 MG/ML IJ SOLN: 6.2500 mg | INTRAMUSCULAR | Status: DC | PRN

## 2016-06-08 MED ORDER — DEXAMETHASONE SODIUM PHOSPHATE 10 MG/ML IJ SOLN
INTRAMUSCULAR | Status: DC | PRN
  Administered 2016-06-08: 10 mg via INTRAVENOUS

## 2016-06-08 MED ORDER — GELATIN ABSORBABLE 100 EX MISC
CUTANEOUS | Status: DC | PRN
  Administered 2016-06-08: 20 mL via TOPICAL

## 2016-06-08 MED ORDER — ONDANSETRON HCL 4 MG/2ML IJ SOLN
INTRAMUSCULAR | Status: DC | PRN
  Administered 2016-06-08: 4 mg via INTRAVENOUS

## 2016-06-08 MED ORDER — LIDOCAINE 2% (20 MG/ML) 5 ML SYRINGE
INTRAMUSCULAR | Status: AC
  Filled 2016-06-08: qty 5

## 2016-06-08 MED ORDER — HEMOSTATIC AGENTS (NO CHARGE) OPTIME
TOPICAL | Status: DC | PRN
  Administered 2016-06-08 (×2): 1 via TOPICAL

## 2016-06-08 MED ORDER — THROMBIN 20000 UNITS EX SOLR
CUTANEOUS | Status: AC
  Filled 2016-06-08: qty 20000

## 2016-06-08 MED ORDER — LACTATED RINGERS IV SOLN
INTRAVENOUS | Status: DC | PRN
  Administered 2016-06-08 (×2): via INTRAVENOUS

## 2016-06-08 MED ORDER — METHOCARBAMOL 500 MG PO TABS: 500.0000 mg | ORAL_TABLET | Freq: Three times a day (TID) | ORAL | Status: DC | PRN

## 2016-06-08 MED ORDER — HYDROMORPHONE HCL 1 MG/ML IJ SOLN
INTRAMUSCULAR | Status: AC
  Filled 2016-06-08: qty 1

## 2016-06-08 MED ORDER — FENTANYL CITRATE (PF) 250 MCG/5ML IJ SOLN
INTRAMUSCULAR | Status: AC
  Filled 2016-06-08: qty 5

## 2016-06-08 MED ORDER — LIDOCAINE HCL (CARDIAC) 20 MG/ML IV SOLN
INTRAVENOUS | Status: DC | PRN
  Administered 2016-06-08: 50 mg via INTRATRACHEAL
  Administered 2016-06-08: 50 mg via INTRAVENOUS

## 2016-06-08 MED ORDER — EPHEDRINE 5 MG/ML INJ
INTRAVENOUS | Status: AC
  Filled 2016-06-08: qty 10

## 2016-06-08 MED ORDER — BUPIVACAINE-EPINEPHRINE (PF) 0.25% -1:200000 IJ SOLN
INTRAMUSCULAR | Status: AC
  Filled 2016-06-08: qty 30

## 2016-06-08 MED ORDER — PHENYLEPHRINE HCL 10 MG/ML IJ SOLN
INTRAMUSCULAR | Status: DC | PRN
  Administered 2016-06-08 (×2): 80 ug via INTRAVENOUS
  Administered 2016-06-08: 120 ug via INTRAVENOUS
  Administered 2016-06-08: 80 ug via INTRAVENOUS
  Administered 2016-06-08 (×2): 120 ug via INTRAVENOUS
  Administered 2016-06-08: 160 ug via INTRAVENOUS

## 2016-06-08 MED ORDER — ROCURONIUM BROMIDE 50 MG/5ML IV SOLN
INTRAVENOUS | Status: AC
  Filled 2016-06-08: qty 1

## 2016-06-08 MED ORDER — MIDAZOLAM HCL 5 MG/5ML IJ SOLN
INTRAMUSCULAR | Status: DC | PRN
  Administered 2016-06-08: 2 mg via INTRAVENOUS

## 2016-06-08 MED ORDER — 0.9 % SODIUM CHLORIDE (POUR BTL) OPTIME
TOPICAL | Status: DC | PRN
Start: 2016-06-08 — End: 2016-06-08
  Administered 2016-06-08: 1000 mL

## 2016-06-08 MED ORDER — ATROPINE SULFATE 1 MG/ML IJ SOLN
INTRAMUSCULAR | Status: AC
  Filled 2016-06-08: qty 1

## 2016-06-08 MED ORDER — METHOCARBAMOL 1000 MG/10ML IJ SOLN: 500.0000 mg | Freq: Four times a day (QID) | INTRAMUSCULAR | Status: DC | PRN

## 2016-06-08 MED ORDER — PROPOFOL 10 MG/ML IV BOLUS
INTRAVENOUS | Status: AC
  Filled 2016-06-08: qty 20

## 2016-06-08 MED ORDER — FENTANYL CITRATE (PF) 100 MCG/2ML IJ SOLN
INTRAMUSCULAR | Status: DC | PRN
  Administered 2016-06-08 (×3): 50 ug via INTRAVENOUS
  Administered 2016-06-08: 100 ug via INTRAVENOUS

## 2016-06-08 MED ORDER — PROPOFOL 10 MG/ML IV BOLUS
INTRAVENOUS | Status: DC | PRN
  Administered 2016-06-08: 140 mg via INTRAVENOUS

## 2016-06-08 MED ORDER — OXYCODONE HCL 5 MG PO TABS
10.0000 mg | ORAL_TABLET | ORAL | Status: DC | PRN
  Administered 2016-06-08: 10 mg via ORAL

## 2016-06-08 MED ORDER — OXYCODONE HCL 5 MG PO TABS
ORAL_TABLET | ORAL | Status: AC
  Filled 2016-06-08: qty 2

## 2016-06-08 MED ORDER — METHOCARBAMOL 500 MG PO TABS
500.0000 mg | ORAL_TABLET | Freq: Four times a day (QID) | ORAL | Status: DC | PRN
  Administered 2016-06-08: 500 mg via ORAL

## 2016-06-08 MED ORDER — MENTHOL 3 MG MT LOZG
LOZENGE | OROMUCOSAL | Status: AC
  Filled 2016-06-08: qty 9

## 2016-06-08 MED FILL — oxyCODONE HCL 5 MG TABS: 5 | 8 days supply | Qty: 60 | Fill #0

## 2016-06-08 MED FILL — METHOCARBAMOL 500 MG TABLET: 500 | 20 days supply | Qty: 60 | Fill #0

## 2016-06-08 MED FILL — ONDANSETRON HCL 4 MG TABLET: 4 | 7 days supply | Qty: 20 | Fill #0

## 2016-06-08 MED FILL — HYDROCODONE-CHLORPHENIRAM S: 10-8 | 14 days supply | Qty: 140 | Fill #0

## 2016-06-08 SURGICAL SUPPLY — 54 items
BLADE SURG ROTATE 9660 (MISCELLANEOUS) IMPLANT
BUR NEURO DRILL SOFT 3.0X3.8M (BURR) ×1 IMPLANT
CANISTER SUCTION 2500CC (MISCELLANEOUS) ×2 IMPLANT
CLSR STERI-STRIP ANTIMIC 1/2X4 (GAUZE/BANDAGES/DRESSINGS) ×2 IMPLANT
COLLAR CERV LO CONTOUR FIRM DE (SOFTGOODS) ×1 IMPLANT
CORDS BIPOLAR (ELECTRODE) ×2 IMPLANT
COVER SURGICAL LIGHT HANDLE (MISCELLANEOUS) ×4 IMPLANT
CRADLE DONUT ADULT HEAD (MISCELLANEOUS) ×2 IMPLANT
DRAPE C-ARM 42X72 X-RAY (DRAPES) ×2 IMPLANT
DRAPE POUCH INSTRU U-SHP 10X18 (DRAPES) ×2 IMPLANT
DRAPE SURG 17X23 STRL (DRAPES) ×2 IMPLANT
DRAPE U-SHAPE 47X51 STRL (DRAPES) ×2 IMPLANT
DRSG AQUACEL AG ADV 3.5X 4 (GAUZE/BANDAGES/DRESSINGS) ×2 IMPLANT
DURAPREP 6ML APPLICATOR 50/CS (WOUND CARE) ×2 IMPLANT
ELECT COATED BLADE 2.86 ST (ELECTRODE) ×2 IMPLANT
ELECT PENCIL ROCKER SW 15FT (MISCELLANEOUS) ×2 IMPLANT
ELECT REM PT RETURN 9FT ADLT (ELECTROSURGICAL) ×2
ELECTRODE REM PT RTRN 9FT ADLT (ELECTROSURGICAL) ×1 IMPLANT
GLOVE BIO SURGEON STRL SZ 6.5 (GLOVE) ×2 IMPLANT
GLOVE BIOGEL PI IND STRL 6.5 (GLOVE) ×1 IMPLANT
GLOVE BIOGEL PI IND STRL 8.5 (GLOVE) ×1 IMPLANT
GLOVE BIOGEL PI INDICATOR 6.5 (GLOVE) ×1
GLOVE BIOGEL PI INDICATOR 8.5 (GLOVE) ×1
GLOVE SS BIOGEL STRL SZ 8.5 (GLOVE) ×1 IMPLANT
GLOVE SUPERSENSE BIOGEL SZ 8.5 (GLOVE) ×1
GOWN STRL REUS W/ TWL LRG LVL3 (GOWN DISPOSABLE) ×1 IMPLANT
GOWN STRL REUS W/TWL 2XL LVL3 (GOWN DISPOSABLE) ×4 IMPLANT
GOWN STRL REUS W/TWL LRG LVL3 (GOWN DISPOSABLE) ×2
KIT BASIN OR (CUSTOM PROCEDURE TRAY) ×2 IMPLANT
KIT ROOM TURNOVER OR (KITS) ×2 IMPLANT
NDL SPNL 18GX3.5 QUINCKE PK (NEEDLE) ×1 IMPLANT
NEEDLE 22X1 1/2 (OR ONLY) (NEEDLE) ×1 IMPLANT
NEEDLE SPNL 18GX3.5 QUINCKE PK (NEEDLE) ×2 IMPLANT
NS IRRIG 1000ML POUR BTL (IV SOLUTION) ×2 IMPLANT
PACK ORTHO CERVICAL (CUSTOM PROCEDURE TRAY) ×2 IMPLANT
PACK UNIVERSAL I (CUSTOM PROCEDURE TRAY) ×2 IMPLANT
PAD ARMBOARD 7.5X6 YLW CONV (MISCELLANEOUS) ×4 IMPLANT
PATTIES SURGICAL .25X.25 (GAUZE/BANDAGES/DRESSINGS) IMPLANT
RESTRAINT LIMB HOLDER UNIV (RESTRAINTS) ×2 IMPLANT
SPONGE INTESTINAL PEANUT (DISPOSABLE) ×3 IMPLANT
SPONGE SURGIFOAM ABS GEL 100 (HEMOSTASIS) ×2 IMPLANT
SURGIFLO W/THROMBIN 8M KIT (HEMOSTASIS) ×1 IMPLANT
SUT BONE WAX W31G (SUTURE) ×3 IMPLANT
SUT MON AB 3-0 SH 27 (SUTURE) ×2
SUT MON AB 3-0 SH27 (SUTURE) ×1 IMPLANT
SUT VIC AB 2-0 CT1 18 (SUTURE) ×2 IMPLANT
SYR BULB IRRIGATION 50ML (SYRINGE) ×2 IMPLANT
SYR CONTROL 10ML LL (SYRINGE) ×2 IMPLANT
TAPE CLOTH 4X10 WHT NS (GAUZE/BANDAGES/DRESSINGS) ×2 IMPLANT
TAPE UMBILICAL COTTON 1/8X30 (MISCELLANEOUS) ×2 IMPLANT
TOWEL OR 17X24 6PK STRL BLUE (TOWEL DISPOSABLE) ×2 IMPLANT
TOWEL OR 17X26 10 PK STRL BLUE (TOWEL DISPOSABLE) ×2 IMPLANT
WATER STERILE IRR 1000ML POUR (IV SOLUTION) ×1 IMPLANT
YANKAUER SUCT BULB TIP NO VENT (SUCTIONS) ×1 IMPLANT

## 2016-06-08 NOTE — Transfer of Care (Signed)
Immediate Anesthesia Transfer of Care Note  Patient: Heather Hanson  Procedure(s) Performed: Procedure(s): REMOVAL OF  EXOSTOSIS ANTERIOR CERVICAL SPINE (N/A)  Patient Location: PACU  Anesthesia Type:General  Level of Consciousness: awake, alert  and oriented  Airway & Oxygen Therapy: Patient Spontanous Breathing and Patient connected to nasal cannula oxygen  Post-op Assessment: Report given to RN and Post -op Vital signs reviewed and stable  Post vital signs: Reviewed and stable  Last Vitals:  Filed Vitals:   06/08/16 0718 06/08/16 0955  BP: 166/76 145/87  Pulse: 85 95  Temp: 37 C   Resp: 20 12    Last Pain:  Filed Vitals:   06/08/16 0956  PainSc: 8       Patients Stated Pain Goal: 3 (06/08/16 0724)  Complications: No apparent anesthesia complications

## 2016-06-08 NOTE — Anesthesia Procedure Notes (Signed)
Procedure Name: Intubation Date/Time: 06/08/2016 7:45 AM Performed by: Adonis HousekeeperNGELL, Mitsuo Budnick M Pre-anesthesia Checklist: Patient identified, Emergency Drugs available, Suction available and Patient being monitored Patient Re-evaluated:Patient Re-evaluated prior to inductionOxygen Delivery Method: Circle system utilized Preoxygenation: Pre-oxygenation with 100% oxygen Intubation Type: IV induction Ventilation: Mask ventilation without difficulty and Oral airway inserted - appropriate to patient size Laryngoscope Size: Glidescope and 3 Grade View: Grade I Tube type: Oral Tube size: 7.0 mm Number of attempts: 1 Airway Equipment and Method: Stylet and Video-laryngoscopy Placement Confirmation: ETT inserted through vocal cords under direct vision,  positive ETCO2 and breath sounds checked- equal and bilateral Secured at: 21 cm Tube secured with: Tape Dental Injury: Teeth and Oropharynx as per pre-operative assessment

## 2016-06-08 NOTE — Discharge Instructions (Signed)
° ° ° ° ° °Today you will be discharged from the hospital.  The purpose of the following handout is to help guide you over the next 2 weeks.  First and foremost, be sure you have a follow up appointment with Dr. Bradely Rudin 2 weeks from the time of your surgery to have your sutures removed.  Please call [facility] [phone] to schedule or confirm this appointment.   ° ° ° °Brace °You do not have to wear the collar while lying in bed or sitting in a high-backed chair, eating, sleeping or showering.  Other than these instances, you must wear the brace.  You may NOT wear the collar while driving a vehicle (see driving restrictions below).  It is advisable that you wear the collar in public places or while traveling in a car as a passenger.  Dr. Shameka Aggarwal will discuss further use of the collar at your 2 week postop visit. ° °Wound Care °You may SHOWER 5 days from the date of surgery.  Shower directly over the steri-strips.  DO NOT scrub or submerge (bath tub, swimming pool, hot tub, etc.) the area.  Pat to dry following your shower.  There is no need for additional dressings other than the steri-strips.  Allow the steri-strips to fall off on their own.  Once the strips have fallen off, you may leave the area undressed.  DO NOT apply lotion/cream/ointment to the area.  The wound must remain dry at all times other than while showering.  Dr. Phong Isenberg or his staff will remove your stiches at your first postop visit and give you additional instructions regarding wound care at that time.  ° °Activity °NO DRIVING FOR 2 WEEKS.  No lifting over 5 pounds (approximately a gallon of milk).  No bending, stooping, squatting or twisting.  No overhead activities.  We encourage you to walk (short distances and often throughout the day) as you can tolerate.  A good rule of thumb is to get up and move once or twice every hour.  You may go up and down stairs carefully.  As you continue to recover, Dr. Crucita Lacorte will address and  adjust restrictions to your activities until no further restrictions are needed.  However, until your first postop visit, when Dr. Kailin Principato can assess your recovery, you are to follow these instructions.  At the end of this document is a tentative outline of activities for up to 1 year.   ° ° ° ° °Medication °You will be discharged from the hospital with medication for pain, spasm, nausea and constipation.  You will be given enough medication to last until your first postop visit in 2 weeks.  Medications WILL NOT BE REFILLED EARLY; therefore, you are to take the medications only as directed.  If you have been given multiple prescriptions, please leave them with your pharmacy.  They can keep them on file for when you need them.  Medications that are lost or stolen WILL NOT be replaced.  We will address the need for continuing certain medications on an individual basis during your postop visit.  We ask that you avoid over the counter anti-inflammatory medications (Advil, Aleve, Motrin) for 3 months.   ° °What you can expect following neck surgery... °It is not uncommon to experience a sore throat or difficulty swallowing following neck surgery.  Cold liquids and soft foods are helpful in soothing this discomfort.  There is no specific diet that you are to follow after surgery, however, there are a   few things you should keep in mind to avoid unneeded discomfort.  Take small bites and eat slowly.  Chew your food thoroughly before swallowing.   It is not uncommon to experience incisional soreness or pain in the back of the neck, shoulders or between the shoulder blades.  These symptoms will slowly begin to resolve as you continue to recover, however, they can last for a few weeks.    It is not uncommon to experience INTERMITTENT arm pain following surgery.  This pain can mimic the arm pain you had prior to surgery.  As long as the pain resolves on its own and is not constant, there is no need to become alarmed.    When To Call If you experience fever >101F, loss of bowel or bladder control, painful swelling in the lower extremities, constant (unresolving) arm pain.  If you experience any of these symptoms, [facility] [phone].  What's Next As mentioned earlier, you will follow up with Dr. Shon BatonBrooks in 2 weeks.  At that time, we will likely remove your stitches and discuss additional aspects of your recovery.                   ACTIVITY GUIDELINES ANTERIOR CERVICAL DISECTOMY AND FUSION  Activity Discharge 2 weeks 6 weeks 3 months 6 months 1 year  Shower 5 days        Submerge the wound  no no yes     Walking outdoors yes       Lifting 5 lbs yes       Climbing stairs yes       Cooking yes       Car rides (less than 30 minutes) yes       Car rides (greater than 30 minutes) no varies yes     Air travel no varies yes     Short outings [employer](church, visits, etc...) yes       School no no yes     Driving a car no no varies yes    Light upper extremity exercises no no varies yes    Stationary bike no no yes     Swimming (no diving) no no no varies yes   Vacuuming, laundry, mopping no no no varies yes   Biking outdoors no no no no varies yes  Light jogging no no no varies yes   Low impact aerobics no no no varies yes   Non-contact sports (tennis, golf) no no no varies yes   Hunting (no tree climbing) no no no varies yes   Dancing (non-gymnastics) no no no varies yes   Down-hill skiing (experienced skier) no no no no yes   Down-hill skiing (novice) no no no no yes   Cross-country skiing no no no no yes   Horseback riding (noncompetitive)  no no no no yes   Horseback riding (competitive) no no no no varies yes  Gardening/landscaping no no no varies yes   House repairs no no no varies varies yes  Lifting up to 50 lbs no no no no varies yes    Ok to shower in 5 days Soft collar for comfort (ok to remove) Soft diet

## 2016-06-08 NOTE — Anesthesia Postprocedure Evaluation (Signed)
Anesthesia Post Note  Patient: Rachael FeeGwendolyn A Oceguera  Procedure(s) Performed: Procedure(s) (LRB): REMOVAL OF  EXOSTOSIS ANTERIOR CERVICAL SPINE (N/A)  Patient location during evaluation: PACU Anesthesia Type: General Level of consciousness: awake and alert Pain management: pain level controlled Vital Signs Assessment: post-procedure vital signs reviewed and stable Respiratory status: spontaneous breathing, nonlabored ventilation, respiratory function stable and patient connected to nasal cannula oxygen Cardiovascular status: blood pressure returned to baseline and stable Postop Assessment: no signs of nausea or vomiting Anesthetic complications: no    Last Vitals:  Filed Vitals:   06/08/16 0718 06/08/16 0955  BP: 166/76 145/87  Pulse: 85 95  Temp: 37 C 36.7 C  Resp: 20 12    Last Pain:  Filed Vitals:   06/08/16 1006  PainSc: 8                  Nazyia Gaugh S

## 2016-06-08 NOTE — H&P (Signed)
History of Present Illness  The patient is a 55 year old female who presents for a Follow-up for Transition into care. The patient is transitioning into care and a summary of care was reviewed.  Additional reasons for visit:  H & P is described as the following: The patient is scheduled for a removal exostosis anterior cervical to be performed by Dr. Debria Garretahari D. Shon [facility]Brooks[facility] 06-08-16 . Please see the hospital record for complete dictated history and physical.   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)   Allergies Aspirin *ANALGESICS - NonNarcotic*  Ibuprofen *ANALGESICS - ANTI-INFLAMMATORY*  Tylenol *ANALGESICS - NonNarcotic*  Allergies Reconciled   Family History Hypertension  mother, father and sister Heart Disease  mother Drug / Alcohol Addiction  grandfather fathers side Rheumatoid Arthritis  sister Osteoporosis  sister Osteoarthritis  sister Diabetes Mellitus  mother, father, brother and child Cerebrovascular Accident  grandfather mothers side Cancer  father and grandmother fathers side Bleeding disorder  grandfather fathers side Depression  father, sister and child Congestive Heart Failure  father Chronic Obstructive Lung Disease  father  Social History Tobacco / smoke exposure  no Tobacco use  never smoker Number of flights of stairs before winded  1 Marital status  divorced Living situation  live alone Pain Contract  no Illicit drug use  no Current work status  disabled Children  2 Alcohol use  current drinker; drinks wine; only occasionally per week Exercise  Exercises never Drug/Alcohol Rehab (Previously)  no Drug/Alcohol Rehab (Currently)  no  Medication History  Effexor  (75MG  Tablet, Oral) Inactive. (qd) Neurontin (300MG  Capsule, Oral) Inactive. ZyrTEC Allergy (10MG  Tablet, Oral) Inactive. (qd) Flexeril (10MG  Tablet, Oral) Inactive. (qhs) Topiramate (50MG  Tablet, Oral) Inactive. (qd) Synthroid (Oral) Specific strength unknown - Inactive. Norflex (100MG  Tablet ER 12HR, Oral) Inactive. (qhs) Ativan (1MG  Tablet, Oral) Inactive. OxyCODONE HCl (5MG  Tablet, Oral) Inactive. (qd Rx'd by Dr Rogelia RohrerBlythe) Medications Reconciled  Past Surgical History Tonsillectomy  Hysterectomy  partial (non-cancerous) Gallbladder Surgery  laporoscopic  Other Problems Hyperthyroidism  Hypercholesterolemia  Migraine Headache  Seizure Disorder  Anxiety Disorder  Allergic Urticaria  Chronic Pain  Fibromyalgia  Depression   Vitals  06/05/2016 1:58 PM Weight: 224 lb Height: 68in Body Surface Area: 2.14 m Body Mass Index: 34.06 kg/m  Temp.: 98.59F(Oral)  BP: 122/102 (Sitting, Left Arm, Standard)  General General Appearance-Not in acute distress. Orientation-Oriented X3. Build & Nutrition-Well nourished and Well developed.  Integumentary General Characteristics Surgical Scars - no surgical scar evidence of previous cervical surgery. Cervical Spine-Skin examination of the cervical spine is without deformity, skin lesions, lacerations or abrasions.  Chest and Lung Exam Auscultation Breath sounds - Normal and Clear.  Cardiovascular Auscultation Rhythm - Regular rate and rhythm.  Peripheral Vascular Upper Extremity Palpation - Radial pulse - Bilateral - 2+.  Neurologic Sensation Upper Extremity - Bilateral - sensation is intact in the upper extremity. Reflexes Biceps Reflex - Bilateral - 2+. Brachioradialis Reflex - Bilateral - 2+. Triceps Reflex - Bilateral - 2+. Hoffman's Sign - Bilateral - Hoffman's sign not present.  Musculoskeletal Spine/Ribs/Pelvis  Cervical Spine : Inspection and Palpation - Tenderness - right  cervical paraspinals tender to palpation and left cervical paraspinals tender to palpation. Strength and Tone: Strength - Deltoid - Bilateral - 5/5. Biceps - Bilateral - 5/5. Triceps - Bilateral -  5/5. Wrist Extension - Bilateral - 5/5. Hand Grip - Bilateral - 5/5. Heel walk - Bilateral - able to heel walk without difficulty. Toe Walk - Bilateral - able to walk on toes without difficulty. Heel-Toe Walk - Bilateral - able to heel-toe walk without difficulty. ROM - Flexion - Moderately Decreased and painful. Extension - Moderately Decreased and painful. Left Lateral Flexion - Moderately Decreased and painful. Right Lateral Flexion - Moderately Decreased and painful. Left Rotation - Moderately Decreased and painful. Right Rotation - Moderately Decreased and painful. Pain - . Cervical Spine - Special Testing - axial compression test negative, cross chest impingement test negative. Non-Anatomic Signs - No non-anatomic signs present. Upper Extremity Range of Motion - No truesholder pain with IR/ER of the shoulders.    Assessment & Plan  Anterior cervical surgery:Risks of surgery include, but are not limited to: Throat pain, swallowing difficulty, hoarseness or change in voice, death, stroke, paralysis, nerve root damage/injury, bleeding, blood clots, loss of bowel/bladder control,spinal fluid leak, adjacent segment disease, need for further surgery, ongoing or worse pain, infection. Post-operative bleeding or swelling that could require emergent surgery.  Goal Of Surgery: Discussed that goal of surgery is to reduce pain and improve function and quality of life. Patient is aware that despite all appropriate treatment that there pain and function could be the same, worse, or different.  Assessments Transcription Very pleasant 55 year old female patient that is scheduled for an excision of exostosis on Thursday. She has been having significant dysphagia. She also reports cervical pain. Reports range of motion  of her neck is painful. Questions were elicited and answered.

## 2016-06-08 NOTE — Brief Op Note (Signed)
06/08/2016  9:44 AM  PATIENT:  Heather Hanson  55 y.o. female  PRE-OPERATIVE DIAGNOSIS:  LaRGE EXOSTOSIS CERVICAL SPINE  POST-OPERATIVE DIAGNOSIS:  LaRGE EXOSTOSIS CERVICAL SPINE  PROCEDURE:  Procedure(s): REMOVAL OF  EXOSTOSIS ANTERIOR CERVICAL SPINE (N/A)  SURGEON:  Surgeon(s) and Role:    * Venita Lickahari Destanie Tibbetts, MD - Primary  PHYSICIAN ASSISTANT:   ASSISTANTS: Carmen Mayo   ANESTHESIA:   general  EBL:  Total I/O In: 1000 [I.V.:1000] Out: 100 [Blood:100]  BLOOD ADMINISTERED:none  DRAINS: none   LOCAL MEDICATIONS USED:  MARCAINE     SPECIMEN:  No Specimen  DISPOSITION OF SPECIMEN:  N/A  COUNTS:  YES  TOURNIQUET:  * No tourniquets in log *  DICTATION: .Other Dictation: Dictation Number [id number]  PLAN OF CARE: Discharge to home after PACU  PATIENT DISPOSITION:  PACU - hemodynamically stable.

## 2016-06-09 ENCOUNTER — Encounter (HOSPITAL_COMMUNITY): Payer: Self-pay | Admitting: Orthopedic Surgery

## 2016-06-09 MED FILL — Thrombin For Soln 20000 Unit: CUTANEOUS | Qty: 1 | Status: AC

## 2016-06-09 NOTE — Op Note (Signed)
Heather Pilar:  Hanson, Heather Hanson            ACCOUNT NO.:  [id number]  MEDICAL RECORD NO.:  [id number]  LOCATION:  [facility]                         FACILITY:  [facility]  PHYSICIAN:  Ares Tegtmeyer D. Shon BatonBrooks, M.D. 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  OPERATIVE PROCEDURE:  Removal of exostosis, C3-4, C4-5, C5-6.  COMPLICATIONS:  None.  CONDITION:  Stable.  FIRST ASSISTANT:  Anette Riedelarmen Mayo, my PA.  HISTORY:  This is a very pleasant woman, who has been having progressive difficulty swallowing for some time now.  Despite conservative care, she continued to suffer.  Imaging studies confirmed a large cervical exostosis causing marked compression of the esophagus.  At this point, having progressive dysphasia and no longer able to tolerate it.  We elected to proceed with surgery.  All appropriate risks, benefits, and alternatives were discussed with the patient and consent was obtained.  OPERATIVE NOTE:  The patient was brought to the operating room, placed supine on the operating table.  After successful induction of general anesthesia and endotracheal intubation, TEDs, SCDs were applied and the anterior cervical spine was prepped and draped.  A time-out was taken to confirm patient, procedure, and all other pertinent important data.  Once this was done, a transverse incision was made centered over the C4-5 disk space level.  Sharp dissection was carried out down to the deep to the platysma.  The platysma was then incised and sharply dissected along the medial border of sternocleidomastoid during my dissection into the deep cervical fascia. I identified the omohyoid muscle and released its fascial sling.  I then swept the esophagus to the right and protected  with appendiceal retractor and then gently started working superiorly towards the very large C3-4 exostosis.  There is marked compression of the esophagus noted.  Once I was mobilized the prevertebral fascia, I then placed appendiceal retractor on either side of the osteophyte and cloud retractors superiorly and now isolated the large C3-4 exostosis.  Using the osteotome and Leksell rongeur, I removed the large exostosis.  I then used a high-speed bur to trim down the remaining portion of it.  At this point, x-rays confirmed complete removal of this large exostosis and I could now visualize much clearer the space for the esophagus between the endotracheal cuff and the anterior cervical spine.  I then went to the C4-5 level and trimmed down this bone spur as well. I did a similar procedure at C5-6.  At this point, with all 3 bone spurs were removed, I then made sure a meticulous hemostasis using bone wax for the osseous bleeding and bipolar electrocautery and FloSeal to the soft tissue bleeding.  I irrigated this wound copiously with normal saline.  I then was able to run my finger along the anterior cervical spine and confirmed that there was no rough edges and had adequate removal of the exostosis.  The trachea and esophagus returned to midline.  The wound was irrigated again and then closed in a layered fashion.  The  platysma was closed with 2-0 Vicryl, and a 3-0 Monocryl for the skin.  Steri-Strips and dry dressing were applied as well as soft collar.  The patient was ultimately extubated, transferred to PACU without incident.  The patient will be monitored in the PACU and then when she meets all appropriate criteria, she can be discharged to home.  She will return to see me in 2 weeks for a wound check.  She was given a script for oxycodone 10 mg, as well as Zofran and Robaxin.  She will resume her home meds except stopping her Flexeril.     Ademide Schaberg D. Shon BatonBrooks,  M.D.     DDB/MEDQ  D:  06/08/2016  T:  06/09/2016  Job:  [id number]  cc:   Debria Garretahari D. Shon BatonBrooks, M.D.

## 2016-07-06 MED FILL — LORazepam 2 MG TABS: 2 | 30 days supply | Qty: 120 | Fill #3

## 2016-07-06 MED FILL — CYCLOBENZAPRINE 10 MG TAB: 10 | 30 days supply | Qty: 30 | Fill #6

## 2016-07-07 MED FILL — oxyCODONE HCL 10 MG TABS: 10 | 25 days supply | Qty: 75 | Fill #0

## 2016-07-07 MED FILL — LORazepam 2 MG TABS: 2 | 30 days supply | Qty: 90 | Fill #0

## 2016-07-07 MED FILL — HYDROXYZINE PAM 25 MG CAP: 25 | 30 days supply | Qty: 90 | Fill #0

## 2016-07-07 MED FILL — METHOCARBAMOL 500 MG TABLET: 500 | 20 days supply | Qty: 40 | Fill #0

## 2016-07-07 MED FILL — TOPIRAMATE 50 MG TABLET: 50 | 30 days supply | Qty: 30 | Fill #0

## 2016-08-02 ENCOUNTER — Other Ambulatory Visit: Payer: Self-pay | Admitting: Hematology & Oncology

## 2016-08-02 ENCOUNTER — Other Ambulatory Visit: Payer: Self-pay | Admitting: Family Medicine

## 2016-08-02 DIAGNOSIS — R112 Nausea with vomiting, unspecified: Secondary | ICD-10-CM

## 2016-08-02 MED FILL — HYDROXYZINE PAM 25 MG CAP: 25 | 30 days supply | Qty: 90 | Fill #1

## 2016-08-02 MED FILL — metroNIDAZOLE 500 MG TABS: 500 | 7 days supply | Qty: 21 | Fill #0

## 2016-08-02 MED FILL — LEVOTHYROXINE 25 MCG TABLET: 25 | 30 days supply | Qty: 30 | Fill #0

## 2016-08-02 MED FILL — TOPIRAMATE 50 MG TABLET: 50 | 30 days supply | Qty: 30 | Fill #1

## 2016-08-02 NOTE — Telephone Encounter (Signed)
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

## 2016-08-02 NOTE — Telephone Encounter (Signed)
Not sure why this was sent back. Please confirm if she is still out patient, I do not think so

## 2016-08-02 NOTE — Telephone Encounter (Signed)
Last ov 09/24/15 no furture appointment noted. Last refill for gabapentin 02/03/16 #90 1 refill. Flexeril is not on patient medication list but there is Norflex. Please advise.

## 2016-08-03 MED FILL — METHOCARBAMOL 500 MG TABLET: 500 | 20 days supply | Qty: 40 | Fill #0

## 2016-08-04 MED FILL — SULFAMETHOXAZOLE-TMP DS TAB: 800-160 | 3 days supply | Qty: 6 | Fill #0

## 2016-08-04 MED FILL — oxyCODONE HCL 10 MG TABS: 10 | 30 days supply | Qty: 90 | Fill #0

## 2016-08-21 MED FILL — MEDERMA CREAM: 30 days supply | Qty: 20 | Fill #0

## 2016-08-21 MED FILL — LORazepam 2 MG TABS: 2 | 30 days supply | Qty: 120 | Fill #0

## 2016-09-15 MED FILL — LORazepam 2 MG TABS: 2 | 30 days supply | Qty: 120 | Fill #0

## 2016-09-15 MED FILL — METHOCARBAMOL 500 MG TABLET: 500 | 30 days supply | Qty: 90 | Fill #0

## 2016-10-05 MED FILL — CYCLOBENZAPRINE 10 MG TAB: 10 | 10 days supply | Qty: 30 | Fill #0

## 2016-10-05 MED FILL — GABAPENTIN 600 MG TABLET: 600 | 30 days supply | Qty: 90 | Fill #0

## 2016-10-05 MED FILL — TOPIRAMATE 50 MG TABLET: 50 | 30 days supply | Qty: 90 | Fill #0

## 2016-10-06 MED FILL — BUTRANS 7.5 MCG/HR PATCH: 7.5 | 28 days supply | Qty: 4 | Fill #0

## 2016-10-20 MED FILL — VENLAFAXINE HCL 75 MG TAB: 75 | 30 days supply | Qty: 90 | Fill #2

## 2016-10-20 MED FILL — LORazepam 2 MG TABS: 2 | 30 days supply | Qty: 90 | Fill #0

## 2016-10-20 MED FILL — LEVOTHYROXINE 25 MCG TABLET: 25 | 30 days supply | Qty: 30 | Fill #1

## 2016-10-26 MED FILL — CYCLOBENZAPRINE 10 MG TAB: 10 | 30 days supply | Qty: 45 | Fill #0

## 2016-11-01 MED FILL — BUTRANS 7.5 MCG/HR PATCH: 7.5 | 28 days supply | Qty: 4 | Fill #0

## 2016-11-17 MED FILL — LORazepam 2 MG TABS: 2 | 30 days supply | Qty: 90 | Fill #0

## 2016-12-06 MED FILL — CYCLOBENZAPRINE 10 MG TAB: 10 | 30 days supply | Qty: 45 | Fill #1

## 2016-12-06 MED FILL — TOPIRAMATE 50 MG TABLET: 50 | 30 days supply | Qty: 90 | Fill #1

## 2016-12-06 MED FILL — VENLAFAXINE HCL 75 MG TAB: 75 | 30 days supply | Qty: 90 | Fill #3

## 2016-12-06 MED FILL — LEVOTHYROXINE 25 MCG TABLET: 25 | 30 days supply | Qty: 30 | Fill #2

## 2016-12-06 MED FILL — GABAPENTIN 600 MG TABLET: 600 | 30 days supply | Qty: 90 | Fill #1

## 2016-12-06 MED FILL — HYDROXYZINE PAM 25 MG CAP: 25 | 30 days supply | Qty: 90 | Fill #2

## 2016-12-07 MED FILL — METHOCARBAMOL 500 MG TABLET: 500 | 10 days supply | Qty: 90 | Fill #0

## 2016-12-15 MED FILL — BUPRENORPHINE 7.5 MCG/HR PA: 7.5 | 28 days supply | Qty: 4 | Fill #0

## 2016-12-18 MED FILL — LORazepam 2 MG TABS: 2 | 30 days supply | Qty: 90 | Fill #0

## 2017-01-03 MED FILL — LEVOTHYROXINE 25 MCG TABLET: 25 | 30 days supply | Qty: 30 | Fill #3

## 2017-01-03 MED FILL — HYDROXYZINE PAM 25 MG CAP: 25 | 30 days supply | Qty: 90 | Fill #3

## 2017-01-03 MED FILL — METHOCARBAMOL 500 MG TABLET: 500 | 10 days supply | Qty: 90 | Fill #1

## 2017-01-03 MED FILL — VENLAFAXINE HCL 75 MG TAB: 75 | 30 days supply | Qty: 90 | Fill #4

## 2017-01-03 MED FILL — CYCLOBENZAPRINE 10 MG TAB: 10 | 30 days supply | Qty: 45 | Fill #2

## 2017-01-16 MED FILL — LORazepam 2 MG TABS: 2 | 30 days supply | Qty: 90 | Fill #0

## 2017-01-17 MED FILL — BUPRENORPHINE 7.5 MCG/HR PA: 7.5 | 28 days supply | Qty: 4 | Fill #0

## 2017-02-07 MED FILL — VITAMIN D3 1,000 UNIT SOFTG: 25 MCG | 200 days supply | Qty: 200 | Fill #0

## 2017-02-13 MED FILL — METHOCARBAMOL 500 MG TABLET: 500 | 30 days supply | Qty: 90 | Fill #0

## 2017-02-13 MED FILL — ZETIA 10 MG TABLET: 10 | 30 days supply | Qty: 30 | Fill #0

## 2017-02-14 MED FILL — LEVOTHYROXINE 25 MCG TABLET: 25 | 30 days supply | Qty: 30 | Fill #4

## 2017-02-14 MED FILL — VENLAFAXINE HCL 75 MG TAB: 75 | 30 days supply | Qty: 90 | Fill #5

## 2017-02-14 MED FILL — TOPIRAMATE 50 MG TABLET: 50 | 30 days supply | Qty: 90 | Fill #0

## 2017-02-14 MED FILL — CYCLOBENZAPRINE 10 MG TAB: 10 | 30 days supply | Qty: 45 | Fill #3

## 2017-02-14 MED FILL — GABAPENTIN 600 MG TABLET: 600 | 30 days supply | Qty: 90 | Fill #0

## 2017-02-14 MED FILL — BUPRENORPHINE 7.5 MCG/HR PA: 7.5 | 28 days supply | Qty: 4 | Fill #1

## 2017-02-15 MED FILL — LORazepam 2 MG TABS: 2 | 30 days supply | Qty: 90 | Fill #0

## 2017-02-28 MED FILL — BUPRENORPHINE 15 MCG/HR PTC: 15 | 28 days supply | Qty: 4 | Fill #0

## 2017-03-12 MED FILL — METHOCARBAMOL 500 MG TABLET: 500 | 30 days supply | Qty: 90 | Fill #0

## 2017-03-12 MED FILL — ZETIA 10 MG TABLET: 10 | 30 days supply | Qty: 30 | Fill #0

## 2017-03-19 MED FILL — LEVOTHYROXINE 25 MCG TABLET: 25 | 30 days supply | Qty: 30 | Fill #5

## 2017-03-19 MED FILL — GABAPENTIN 600 MG TABLET: 600 | 30 days supply | Qty: 90 | Fill #1

## 2017-03-19 MED FILL — CYCLOBENZAPRINE 10 MG TAB: 10 | 30 days supply | Qty: 45 | Fill #4

## 2017-03-19 MED FILL — BUPRENORPHINE 7.5 MCG/HR PA: 7.5 | 28 days supply | Qty: 4 | Fill #2

## 2017-03-19 MED FILL — LORazepam 2 MG TABS: 2 | 30 days supply | Qty: 90 | Fill #0

## 2017-03-19 MED FILL — VENLAFAXINE HCL 75 MG TAB: 75 | 30 days supply | Qty: 90 | Fill #0

## 2017-03-30 MED FILL — BUPRENORPHINE 15 MCG/HR PTC: 15 | 28 days supply | Qty: 4 | Fill #1

## 2017-04-17 MED FILL — BELBUCA 150 MCG FILM: 150 | 30 days supply | Qty: 60 | Fill #0

## 2017-04-18 MED FILL — VENLAFAXINE HCL ER 75 MG CA: 75 | 30 days supply | Qty: 90 | Fill #0

## 2017-04-18 MED FILL — VITAMIN D 1,000 UNITS TAB: 25 MCG | 100 days supply | Qty: 100 | Fill #0

## 2017-04-18 MED FILL — LORazepam 2 MG TABS: 2 | 30 days supply | Qty: 90 | Fill #0

## 2017-04-18 MED FILL — CYCLOBENZAPRINE 10 MG TAB: 10 | 30 days supply | Qty: 60 | Fill #0

## 2017-04-18 MED FILL — LEVOTHYROXINE 25 MCG TABLET: 25 | 30 days supply | Qty: 30 | Fill #0

## 2017-04-23 MED FILL — TOPIRAMATE 50 MG TABLET: 50 | 30 days supply | Qty: 90 | Fill #1

## 2017-04-23 MED FILL — GABAPENTIN 600 MG TABLET: 600 | 30 days supply | Qty: 90 | Fill #2

## 2017-04-23 MED FILL — METHOCARBAMOL 500 MG TABLET: 500 | 30 days supply | Qty: 90 | Fill #1

## 2017-05-17 MED FILL — PHENADOZ 25 MG SUPP: 25 | 15 days supply | Qty: 30 | Fill #0

## 2017-05-17 MED FILL — METHOCARBAMOL 500 MG TABLET: 500 | 30 days supply | Qty: 90 | Fill #0

## 2017-05-17 MED FILL — AZITHROMYCIN 250 MG TABLET: 250 | 5 days supply | Qty: 6 | Fill #0

## 2017-05-17 MED FILL — ZETIA 10 MG TABLET: 10 | 30 days supply | Qty: 30 | Fill #0

## 2017-05-21 MED FILL — LORazepam 2 MG TABS: 2 | 30 days supply | Qty: 90 | Fill #0

## 2017-05-21 MED FILL — CYCLOBENZAPRINE 10 MG TAB: 10 | 30 days supply | Qty: 60 | Fill #1

## 2017-05-24 MED FILL — NUCYNTA 50 MG TABLET: 50 | 30 days supply | Qty: 120 | Fill #0

## 2017-06-22 MED FILL — GABAPENTIN 600 MG TABLET: 600 | 30 days supply | Qty: 90 | Fill #0

## 2017-06-25 MED FILL — METHOCARBAMOL 500 MG TABLET: 500 | 30 days supply | Qty: 90 | Fill #1

## 2017-06-25 MED FILL — LORazepam 2 MG TABS: 2 | 30 days supply | Qty: 90 | Fill #0

## 2017-06-25 MED FILL — LEVOTHYROXINE 25 MCG TABLET: 25 | 30 days supply | Qty: 30 | Fill #1

## 2017-06-25 MED FILL — TOPIRAMATE 50 MG TABLET: 50 | 30 days supply | Qty: 90 | Fill #2

## 2017-06-25 MED FILL — CYCLOBENZAPRINE 10 MG TABLE: 10 | 30 days supply | Qty: 60 | Fill #2

## 2017-06-25 MED FILL — NUCYNTA 50 MG TABLET: 50 | 30 days supply | Qty: 120 | Fill #0

## 2017-07-19 MED FILL — VENLAFAXINE HCL ER 75 MG CA: 75 | 30 days supply | Qty: 90 | Fill #0

## 2017-07-19 MED FILL — LEVOTHYROXINE 25 MCG TABLET: 25 | 30 days supply | Qty: 30 | Fill #0

## 2017-07-19 MED FILL — ZETIA 10 MG TABLET: 10 | 30 days supply | Qty: 30 | Fill #0

## 2017-07-19 MED FILL — hydrOXYzine HCL 25 MG TABS: 25 | 30 days supply | Qty: 90 | Fill #0

## 2017-07-19 MED FILL — CYCLOBENZAPRINE 10 MG TABLE: 10 | 30 days supply | Qty: 60 | Fill #0

## 2017-07-25 MED FILL — LORazepam 2 MG TABS: 2 | 30 days supply | Qty: 90 | Fill #0

## 2017-07-25 MED FILL — NUCYNTA 50 MG TABLET: 50 | 30 days supply | Qty: 120 | Fill #0

## 2017-08-21 MED FILL — FUROSEMIDE 20 MG TAB: 20 | 5 days supply | Qty: 5 | Fill #0

## 2017-08-21 MED FILL — LORazepam 2 MG TABS: 2 | 30 days supply | Qty: 90 | Fill #0

## 2017-08-21 MED FILL — METHOCARBAMOL 500 MG TABLET: 500 | 30 days supply | Qty: 90 | Fill #0

## 2017-08-21 MED FILL — VITAMIN D 1,000 UNITS TAB: 25 MCG | 100 days supply | Qty: 100 | Fill #0

## 2017-08-21 MED FILL — FLUoxetine HCL 20 MG CAPS: 20 | 30 days supply | Qty: 30 | Fill #0

## 2017-09-18 MED FILL — CYCLOBENZAPRINE 10 MG TAB: 10 | 30 days supply | Qty: 60 | Fill #1

## 2017-09-18 MED FILL — TOPIRAMATE 50 MG TABLET: 50 | 30 days supply | Qty: 90 | Fill #3

## 2017-09-18 MED FILL — LEVOTHYROXINE 25 MCG TABLET: 25 | 30 days supply | Qty: 30 | Fill #1

## 2017-09-18 MED FILL — ZETIA 10 MG TABLET: 10 | 30 days supply | Qty: 30 | Fill #1

## 2017-09-18 MED FILL — hydrOXYzine HCL 25 MG TABS: 25 | 30 days supply | Qty: 90 | Fill #1

## 2017-09-18 MED FILL — GABAPENTIN 600 MG TABS: 600 | 30 days supply | Qty: 90 | Fill #1

## 2017-09-19 MED FILL — NUCYNTA 50 MG TABLET: 50 | 30 days supply | Qty: 120 | Fill #0

## 2017-10-18 MED FILL — traZODone HCL 50 MG TABS: 50 | 30 days supply | Qty: 30 | Fill #0

## 2017-10-18 MED FILL — LEVOTHYROXINE 25 MCG TABLET: 25 | 30 days supply | Qty: 30 | Fill #2

## 2017-10-18 MED FILL — hydrOXYzine HCL 25 MG TABS: 25 | 30 days supply | Qty: 90 | Fill #2

## 2017-10-18 MED FILL — CYCLOBENZAPRINE 10 MG TAB: 10 | 30 days supply | Qty: 60 | Fill #2

## 2017-10-18 MED FILL — ZETIA 10 MG TABLET: 10 | 30 days supply | Qty: 30 | Fill #2

## 2017-11-08 MED FILL — traZODone HCL 100 MG TABS: 100 | 30 days supply | Qty: 30 | Fill #0

## 2017-11-23 MED FILL — oxyCODONE HCL 5 MG TABS: 5 | 30 days supply | Qty: 90 | Fill #0

## 2017-12-05 MED FILL — traZODone HCL 100 MG TABS: 100 | 30 days supply | Qty: 30 | Fill #0

## 2017-12-14 ENCOUNTER — Other Ambulatory Visit: Payer: Self-pay | Admitting: Family Medicine

## 2017-12-14 DIAGNOSIS — Z1231 Encounter for screening mammogram for malignant neoplasm of breast: Secondary | ICD-10-CM

## 2017-12-26 ENCOUNTER — Other Ambulatory Visit: Payer: Self-pay

## 2017-12-26 ENCOUNTER — Encounter (HOSPITAL_COMMUNITY): Payer: Self-pay | Admitting: Emergency Medicine

## 2017-12-26 DIAGNOSIS — B889 Infestation, unspecified: Secondary | ICD-10-CM | POA: Diagnosis present

## 2017-12-26 DIAGNOSIS — E039 Hypothyroidism, unspecified: Secondary | ICD-10-CM | POA: Insufficient documentation

## 2017-12-26 DIAGNOSIS — Z79899 Other long term (current) drug therapy: Secondary | ICD-10-CM | POA: Insufficient documentation

## 2017-12-26 DIAGNOSIS — Z91040 Latex allergy status: Secondary | ICD-10-CM | POA: Diagnosis not present

## 2017-12-26 DIAGNOSIS — Z113 Encounter for screening for infections with a predominantly sexual mode of transmission: Secondary | ICD-10-CM | POA: Insufficient documentation

## 2017-12-26 NOTE — ED Triage Notes (Signed)
Pt states she found a bug on her vaginal area  States she is concerned it may have been a crab  Pt states after they had sex she had a foul odor and burning  Pt is upset and tearful as she said it was with her fiancee

## 2017-12-27 ENCOUNTER — Emergency Department (HOSPITAL_COMMUNITY)
Admission: EM | Admit: 2017-12-27 | Discharge: 2017-12-27 | Disposition: A | Discharge status: Home / Self Care | Payer: Medicaid Other | Attending: Emergency Medicine | Admitting: Emergency Medicine

## 2017-12-27 DIAGNOSIS — Z7689 Persons encountering health services in other specified circumstances: Secondary | ICD-10-CM

## 2017-12-27 LAB — WET PREP, GENITAL
CLUE CELLS WET PREP: NONE SEEN
SPERM: NONE SEEN
Trich, Wet Prep: NONE SEEN
YEAST WET PREP: NONE SEEN

## 2017-12-27 MED FILL — oxyCODONE HCL 5 MG TABS: 5 | 30 days supply | Qty: 90 | Fill #0

## 2017-12-27 NOTE — ED Provider Notes (Addendum)
[facility]-EMERGENCY DEPT Provider Note   CSN: [id number] Arrival date & time: 12/26/17  2109     History   Chief Complaint Chief Complaint  Patient presents with  . STD check    HPI Heather Hanson is a 57 y.o. female.  The history is provided by the patient.  Exposure to STD  This is a new problem. The current episode started 3 to 5 hours ago. The problem occurs constantly. The problem has not changed since onset.Pertinent negatives include no chest pain, no abdominal pain, no headaches and no shortness of breath. Nothing aggravates the symptoms. Nothing relieves the symptoms. She has tried nothing for the symptoms. The treatment provided no relief.  States she felt something crawling on her that looked like a small crab.    Past Medical History:  Diagnosis Date  . Adjustment disorder with mixed anxiety and depressed mood 09/28/2009   Qualifier: Diagnosis of  By: Nena Jordan   . Allergic state 04/09/2013  . Anemia    thalassemia  . Anxiety    takes Ativan daily prn  . Arthritis    sees Dr.Beekman  . Bursitis of right shoulder   . Cholecystitis, acute 08/30/2013  . Chronic insomnia   . Complication of anesthesia    hard to sedate  . Constipation    takes Linzess daily prn  . Depression    takes Effexor daily  . Difficulty swallowing solids   . Fibromyalgia   . GERD (gastroesophageal reflux disease) 06/17/2013   takes Dexilant daily prn  . H. pylori infection 07/19/2013  . H/O hiatal hernia   . Headache(784.0) 05/22/2013  . Hereditary and idiopathic peripheral neuropathy 03/14/2015  . History of bronchitis    last time many yrs ago  . History of colonoscopy 2000  . Hyperlipidemia    not on any meds   . Hypoglycemia   . Hypothyroidism   . Lichen    Dr[facility]Fleisher at [facility]  . Migraines    takes Topamax daily-last migraine today  . Muscle spasms of head and/or neck    takes Norflex daily prn  . Nausea    takes Phenergan prn  .  Obesity   . Peripheral edema    takes Lasix prn  . PONV (postoperative nausea and vomiting)    with tonsil removal surgery  . PUD (peptic ulcer disease)    Dr Loreta Ave ( H. Pylori)  . Seizure disorder (HCC) x 2   presumed secondary to ativan withdrawal -Dr Sharene Skeans  . Seizures (HCC)    X 2  . Shortness of breath    pt states when Fibromyalgia flares up  . Sinusitis, acute 04/09/2013   takes Zyrtec daily    Patient Active Problem List   Diagnosis Date Noted  . Hereditary and idiopathic peripheral neuropathy 03/14/2015  . Hypothyroidism 03/11/2014  . Cholecystitis, acute 08/30/2013  . H. pylori infection 07/19/2013  . GERD (gastroesophageal reflux disease) 06/17/2013  . Headache(784.0) 05/22/2013  . Sinusitis, acute 04/09/2013  . Allergic state 04/09/2013  . Back pain 01/27/2012  . Annual physical exam 09/30/2011  . Microcytosis 09/30/2011  . Lichen planus 09/30/2011  . Polyarthralgia 09/30/2011  . DERMATITIS 12/15/2010  . Constipation 11/23/2009  . FIBROMYALGIA 11/09/2009  . NAUSEA 11/09/2009  . Hyperlipidemia, mixed 09/28/2009  . Obesity 09/28/2009  . Anemia 09/28/2009  . Depression with anxiety 09/28/2009  . SEIZURE DISORDER 09/28/2009    Past Surgical History:  Procedure Laterality Date  . ABDOMINAL HYSTERECTOMY  2000   partial  . CHOLECYSTECTOMY N/A 09/16/2013   Procedure: LAPAROSCOPIC CHOLECYSTECTOMY;  Surgeon: Emelia Loron, MD;  Location: [facility] OR;  Service: General;  Laterality: N/A;  . COLONOSCOPY    . REMOVAL OF CERVICAL EXOSTOSIS N/A 06/08/2016   Procedure: REMOVAL OF  EXOSTOSIS ANTERIOR CERVICAL SPINE;  Surgeon: Venita Lick, MD;  Location: [facility] OR;  Service: Orthopedics;  Laterality: N/A;  . TONSILLECTOMY  1972    OB History    No data available       Home Medications    Prior to Admission medications   Medication Sig Start Date End Date Taking? Authorizing Provider  cetirizine (ZYRTEC) 10 MG tablet Take 1 tablet (10 mg total) by mouth daily as  needed for allergies or rhinitis. 06/15/15   Bradd Canary, MD  chlorpheniramine-HYDROcodone (TUSSIONEX) 10-8 MG/5ML SUER Take 5 mLs by mouth every 12 (twelve) hours as needed for cough. 06/08/16   Josph Macho, MD  furosemide (LASIX) 20 MG tablet TAKE 1 TABLET (20 MG TOTAL) BY MOUTH DAILY AS NEEDED. 03/04/15   Bradd Canary, MD  gabapentin (NEURONTIN) 600 MG tablet TAKE 1 TABLET (600 MG TOTAL) BY MOUTH 3 (THREE) TIMES DAILY. 02/03/16   Bradd Canary, MD  levothyroxine (SYNTHROID, LEVOTHROID) 25 MCG tablet TAKE 1 TABLET BY MOUTH DAILY BEFORE BREAKFAST 03/30/16   Bradd Canary, MD  LORazepam (ATIVAN) 2 MG tablet TAKE 1 TABLET BY MOUTH EVERY 6 HOURS AS NEEDED FOR ANXIETY 11/22/15   Bradd Canary, MD  methocarbamol (ROBAXIN) 500 MG tablet Take 1 tablet (500 mg total) by mouth 3 (three) times daily as needed for muscle spasms. 06/08/16   Venita Lick, MD  metroNIDAZOLE (FLAGYL) 500 MG tablet TAKE 1 TABLET (500 MG TOTAL) BY MOUTH 3 (THREE) TIMES DAILY. 08/02/16   Josph Macho, MD  ondansetron (ZOFRAN) 4 MG tablet Take 1 tablet (4 mg total) by mouth every 8 (eight) hours as needed for nausea or vomiting. 06/08/16   Venita Lick, MD  orphenadrine (NORFLEX) 100 MG tablet TAKE 1 TABLET BY MOUTH EVERY MORNING AS NEEDED. 02/29/16   Bradd Canary, MD  oxyCODONE (OXY IR/ROXICODONE) 5 MG immediate release tablet Take 2 tablets (10 mg total) by mouth every 6 (six) hours as needed for severe pain. 06/08/16   Venita Lick, MD  potassium chloride SA (K-DUR,KLOR-CON) 20 MEQ tablet Take 1 tablet (20 mEq total) by mouth as directed. 03/04/15   Bradd Canary, MD  SUMAtriptan (IMITREX) 20 MG/ACT nasal spray PLACE 1 SPRAY (20 MG TOTAL) INTO THE NOSE EVERY 2 (TWO) HOURS AS NEEDED FOR MIGRAINE OR HEADACHE. MAX OF 2 DOSES IN 24 HOURS 10/01/14   Bradd Canary, MD  topiramate (TOPAMAX) 50 MG tablet TAKE 1 TABLET BY MOUTH 3 TIMES A DAY 02/29/16   Bradd Canary, MD  venlafaxine (EFFEXOR) 75 MG tablet TAKE 1 TABLET  BY MOUTH 3 TIMES DAILY. 05/11/16   Bradd Canary, MD    Family History Family History  Problem Relation Age of Onset  . Diabetes Mother        typer 2  . Heart disease Mother        PVD, PAD  . Cancer Father        prostate, lung, brain mets, smoker  . COPD Father   . Hyperlipidemia Father   . Hypertension Father   . Thyroid disease Father   . Hypertension Sister   . Thyroid disease Sister   . Arthritis Sister   .  Diabetes Son 16       type 1  . Diabetes Maternal Grandmother   . Stroke Maternal Grandfather   . Cancer Paternal Grandmother 41       breast  . Alcohol abuse Paternal Grandfather   . Hypertension Sister   . Obesity Sister   . Psoriasis Son   . Cancer Unknown        Breast < 20 yo, ,Prostate <50 yo  . Coronary artery disease Unknown        <60 yo  . Diabetes Unknown     Social History Social History   Tobacco Use  . Smoking status: Never Smoker  . Smokeless tobacco: Never Used  Substance Use Topics  . Alcohol use: No    Frequency: Never  . Drug use: Yes    Types: Marijuana    Comment: Rare     Allergies   Aspirin; Augmentin [amoxicillin-pot clavulanate]; Dexilant [dexlansoprazole]; Hydrocodone-acetaminophen; Levsin [hyoscyamine sulfate]; Linzess [linaclotide]; Promethazine; Propoxyphene n-acetaminophen; Statins; Acetaminophen; Latex; and Oxycodone-acetaminophen   Review of Systems Review of Systems  Respiratory: Negative for shortness of breath.   Cardiovascular: Negative for chest pain.  Gastrointestinal: Negative for abdominal pain.  Genitourinary: Negative for dysuria, hematuria, pelvic pain, vaginal bleeding, vaginal discharge and vaginal pain.  Neurological: Negative for headaches.  All other systems reviewed and are negative.    Physical Exam Updated Vital Signs BP (!) 143/88   Pulse 92   Temp 97.7 F (36.5 C) (Oral)   Resp 19   SpO2 96%   Physical Exam  Constitutional: She is oriented to person, place, and time. She appears  well-developed and well-nourished. No distress.  HENT:  Head: Normocephalic and atraumatic.  Mouth/Throat: No oropharyngeal exudate.  Eyes: Conjunctivae are normal. Pupils are equal, round, and reactive to light.  Neck: Normal range of motion. Neck supple.  Cardiovascular: Normal rate, regular rhythm, normal heart sounds and intact distal pulses.  Pulmonary/Chest: Effort normal and breath sounds normal. No stridor. She has no wheezes.  Abdominal: Soft. Bowel sounds are normal. She exhibits no mass. There is no tenderness. There is no rebound and no guarding.  Genitourinary: No vaginal discharge found.  Genitourinary Comments: Chaperone present, pubic area examined extensively.  No nits no casings no signs of pubic lice.    Musculoskeletal: Normal range of motion.  Neurological: She is alert and oriented to person, place, and time. She displays normal reflexes.  Skin: Skin is warm and dry. Capillary refill takes less than 2 seconds.  Psychiatric: Her mood appears anxious.     ED Treatments / Results  Labs (all labs ordered are listed, but only abnormal results are displayed)  Results for orders placed or performed during the hospital encounter of 12/27/17  Wet prep, genital  Result Value Ref Range   Yeast Wet Prep HPF POC NONE SEEN NONE SEEN   Trich, Wet Prep NONE SEEN NONE SEEN   Clue Cells Wet Prep HPF POC NONE SEEN NONE SEEN   WBC, Wet Prep HPF POC FEW (A) NONE SEEN   Sperm NONE SEEN    No results found.   Procedures Procedures (including critical care time)    No signs of pubic lice. Patient is irate stating " a small crab that looked like a regular but small crab was in my hand and is now still crawling on me."  She also reports "sex smells like death for the last 6 months.:  EDP reassured the patient that the exam was benign and reassuring  and there were no nits, casing or pubic lice seen on exam.  EDP apologized for the frustration and if there are ongoing concerns about  an order patient should follow up with her GYN.  This was all communicated with nurse ATI present.  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 EDP appreciates patient's concern but that pubic lice do not look like miniature crabs.  EDP stated we took the patient's complaint very seriously.  Nurse stated we have ruled out life threatening conditions and patient should follow up with a primary provider for other issues.    Final Clinical Impressions(s) / ED Diagnoses   Patient's significant other was seen by Dahlia ClientHannah and had no lice.  GYN exam is benign and reassuring.  There is nothing on the remainder of the skin.  No indication for treatment at this time.  Follow up with your GYN for ongoing concerns.    Return for worsening pain, vomiting blood inability to pass urine,  fevers > 100.4 unrelieved by medication, shortness of breath, intractable vomiting, or diarrhea, abdominal pain, Inability to tolerate liquids or food, cough, altered mental status or any concerns. No signs of systemic illness or infection. The patient is nontoxic-appearing on exam and vital signs are within normal limits.    I have reviewed the triage vital signs and the nursing notes. Pertinent labs &imaging results that were available during my care of the patient were reviewed by me and considered in my medical decision making (see chart for details).  After history, exam, and medical workup I feel the patient has been appropriately medically screened and is safe for discharge home. Pertinent diagnoses were discussed with the patient. Patient was given return precautions.   Marlinda Miranda, MD 12/27/17 [id number]    Cy BlamerPalumbo, Shakir Petrosino, MD 12/27/17 [id number]

## 2017-12-27 NOTE — ED Notes (Signed)
Palumbo, MD discussed and review course of treatment, findings and follow-up care w/ pt. 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

## 2017-12-28 ENCOUNTER — Encounter (HOSPITAL_COMMUNITY): Payer: Self-pay | Admitting: Emergency Medicine

## 2017-12-28 LAB — GC/CHLAMYDIA PROBE AMP (~~LOC~~) NOT AT ARMC
Chlamydia: NEGATIVE
Neisseria Gonorrhea: NEGATIVE

## 2018-01-19 ENCOUNTER — Encounter (HOSPITAL_COMMUNITY): Payer: Self-pay

## 2018-01-19 ENCOUNTER — Other Ambulatory Visit: Payer: Self-pay

## 2018-01-19 ENCOUNTER — Emergency Department (HOSPITAL_COMMUNITY)
Admission: EM | Admit: 2018-01-19 | Discharge: 2018-01-21 | Disposition: A | Discharge status: Other Acute Inpatient Hospital | Payer: Medicaid Other | Attending: Emergency Medicine | Admitting: Emergency Medicine

## 2018-01-19 DIAGNOSIS — F323 Major depressive disorder, single episode, severe with psychotic features: Secondary | ICD-10-CM | POA: Diagnosis not present

## 2018-01-19 DIAGNOSIS — Z79899 Other long term (current) drug therapy: Secondary | ICD-10-CM | POA: Diagnosis not present

## 2018-01-19 DIAGNOSIS — F332 Major depressive disorder, recurrent severe without psychotic features: Secondary | ICD-10-CM | POA: Diagnosis not present

## 2018-01-19 DIAGNOSIS — T1491XA Suicide attempt, initial encounter: Secondary | ICD-10-CM

## 2018-01-19 DIAGNOSIS — Z91040 Latex allergy status: Secondary | ICD-10-CM | POA: Insufficient documentation

## 2018-01-19 DIAGNOSIS — T50902A Poisoning by unspecified drugs, medicaments and biological substances, intentional self-harm, initial encounter: Secondary | ICD-10-CM | POA: Insufficient documentation

## 2018-01-19 DIAGNOSIS — G894 Chronic pain syndrome: Secondary | ICD-10-CM | POA: Diagnosis not present

## 2018-01-19 DIAGNOSIS — F1290 Cannabis use, unspecified, uncomplicated: Secondary | ICD-10-CM | POA: Diagnosis not present

## 2018-01-19 DIAGNOSIS — Z811 Family history of alcohol abuse and dependence: Secondary | ICD-10-CM | POA: Diagnosis not present

## 2018-01-19 LAB — COMPREHENSIVE METABOLIC PANEL
ALT: 24 U/L (ref 14–54)
AST: 34 U/L (ref 15–41)
Albumin: 3.9 g/dL (ref 3.5–5.0)
Alkaline Phosphatase: 84 U/L (ref 38–126)
Anion gap: 8 (ref 5–15)
BILIRUBIN TOTAL: 0.4 mg/dL (ref 0.3–1.2)
BUN: 12 mg/dL (ref 6–20)
CO2: 25 mmol/L (ref 22–32)
CREATININE: 1.02 mg/dL — AB (ref 0.44–1.00)
Calcium: 8.9 mg/dL (ref 8.9–10.3)
Chloride: 105 mmol/L (ref 101–111)
GFR calc Af Amer: >60 mL/min (ref >60)
GFR, EST NON AFRICAN AMERICAN: 59 mL/min — AB (ref >60)
Glucose, Bld: 90 mg/dL (ref 65–99)
POTASSIUM: 3 mmol/L — AB (ref 3.5–5.1)
Sodium: 138 mmol/L (ref 135–145)
TOTAL PROTEIN: 7.8 g/dL (ref 6.5–8.1)

## 2018-01-19 LAB — ETHANOL

## 2018-01-19 LAB — SALICYLATE LEVEL: Salicylate Lvl: <7 mg/dL (ref 2.8–30.0)

## 2018-01-19 LAB — I-STAT BETA HCG BLOOD, ED (MC, WL, AP ONLY): I-stat hCG, quantitative: <5 m[IU]/mL (ref <5)

## 2018-01-19 LAB — CBC
HCT: 39.9 % (ref 36.0–46.0)
Hemoglobin: 12.6 g/dL (ref 12.0–15.0)
MCH: 21.9 pg — AB (ref 26.0–34.0)
MCHC: 31.6 g/dL (ref 30.0–36.0)
MCV: 69.4 fL — ABNORMAL LOW (ref 78.0–100.0)
PLATELETS: 202 10*3/uL (ref 150–400)
RBC: 5.75 MIL/uL — ABNORMAL HIGH (ref 3.87–5.11)
RDW: 15.4 % (ref 11.5–15.5)
WBC: 6.6 10*3/uL (ref 4.0–10.5)

## 2018-01-19 LAB — ACETAMINOPHEN LEVEL: Acetaminophen (Tylenol), Serum: <10 ug/mL — ABNORMAL LOW (ref 10–30)

## 2018-01-19 MED ORDER — ACTIDOSE WITH SORBITOL 50 GM/240ML PO LIQD
50.0000 g | Freq: Once | ORAL | Status: AC
  Administered 2018-01-19: 50 g via ORAL
  Filled 2018-01-19: qty 240

## 2018-01-19 NOTE — ED Notes (Signed)
Bed: RESA Expected date:  Expected time:  Means of arrival:  Comments: [age] overdose

## 2018-01-19 NOTE — ED Triage Notes (Signed)
Took 6(neurontin), oxycodone 6 tabs, klonopin 6 tab. About one hour ago pt alert and awake at this time EMS states she left a suicide note.

## 2018-01-19 NOTE — ED Notes (Signed)
Talked with poison control gave recommendations to doctor.

## 2018-01-19 NOTE — ED Notes (Signed)
Pt aware that urine sample is needed.  

## 2018-01-19 NOTE — ED Provider Notes (Signed)
[facility]-EMERGENCY DEPT Provider Note   CSN: [id number] Arrival date & time: 01/19/18  2050     History   Chief Complaint Chief Complaint  Patient presents with  . Suicidal    HPI Heather Hanson is a 57 y.o. female.  HPI Patient reports she is "just done".  She is tearful.  She reports she wants to die.  She reports that she is off and on homeless.  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  She reports she took a "bag" of Klonopin that she bought from somebody.  She estimates they were 10 tablets.  She does not know the drinks of the tablets.  She also took about 5 of her oxycodone 5 mg.  She reports her fianc found her else she would have continued to take the rest of her medications. Past Medical History:  Diagnosis Date  . Adjustment disorder with mixed anxiety and depressed mood 09/28/2009   Qualifier: Diagnosis of  By: Nena Jordan   . Allergic state 04/09/2013  . Anemia    thalassemia  . Anxiety    takes Ativan daily prn  . Arthritis    sees Dr.Beekman  . Bursitis of right shoulder   . Cholecystitis, acute 08/30/2013  . Chronic insomnia   . Complication of anesthesia    hard to sedate  . Constipation    takes Linzess daily prn  . Depression    takes Effexor daily  . Difficulty swallowing solids   . Fibromyalgia   . GERD (gastroesophageal reflux disease) 06/17/2013   takes Dexilant daily prn  . H. pylori infection 07/19/2013  . H/O hiatal hernia   . Headache(784.0) 05/22/2013  . Hereditary and idiopathic peripheral neuropathy 03/14/2015  . History of bronchitis    last time many yrs ago  . History of colonoscopy 2000  . Hyperlipidemia    not on any meds   . Hypoglycemia   . Hypothyroidism   . Lichen    Dr[facility]Fleisher at [facility]  . Migraines    takes Topamax daily-last migraine today  . Muscle spasms of head and/or neck    takes Norflex daily prn    . Nausea    takes Phenergan prn  . Obesity   . Peripheral edema    takes Lasix prn  . PONV (postoperative nausea and vomiting)    with tonsil removal surgery  . PUD (peptic ulcer disease)    Dr Loreta Ave ( [facility]. Pyl[facility])  . Seizure disorder (HCC) x 2   presumed secondary to ativan withdrawal -Dr Sharene Skeans  . Seizures (HCC)    X 2  . Shortness of breath    pt states when Fibromyalgia flares up  . Sinusitis, acute 04/09/2013   takes Zyrtec daily    Patient Active Problem List   Diagnosis Date Noted  . Hereditary and idiopathic peripheral neuropathy 03/14/2015  . Hypothyroidism 03/11/2014  . Cholecystitis, acute 08/30/2013  . H. pylori infection 07/19/2013  . GERD (gastroesophageal reflux disease) 06/17/2013  . Headache(784.0) 05/22/2013  . Sinusitis, acute 04/09/2013  . Allergic state 04/09/2013  . Back pain 01/27/2012  . Annual physical exam 09/30/2011  . Microcytosis 09/30/2011  . Lichen planus 09/30/2011  . Polyarthralgia 09/30/2011  . DERMATITIS 12/15/2010  . Constipation 11/23/2009  . FIBROMYALGIA 11/09/2009  . NAUSEA 11/09/2009  . Hyperlipidemia, mixed 09/28/2009  . Obesity 09/28/2009  . Anemia 09/28/2009  .  Depression with anxiety 09/28/2009  . SEIZURE DISORDER 09/28/2009    Past Surgical History:  Procedure Laterality Date  . ABDOMINAL HYSTERECTOMY  2000   partial  . CHOLECYSTECTOMY N/A 09/16/2013   Procedure: LAPAROSCOPIC CHOLECYSTECTOMY;  Surgeon: Emelia LoronMatthew Wakefield, MD;  Location: [facility] OR;  Service: General;  Laterality: N/A;  . COLONOSCOPY    . REMOVAL OF CERVICAL EXOSTOSIS N/A 06/08/2016   Procedure: REMOVAL OF  EXOSTOSIS ANTERIOR CERVICAL SPINE;  Surgeon: Venita Lickahari Brooks, MD;  Location: [facility] OR;  Service: Orthopedics;  Laterality: N/A;  . TONSILLECTOMY  1972    OB History    No data available       Home Medications    Prior to Admission medications   Medication Sig Start Date End Date Taking? Authorizing Provider  chlorpheniramine-HYDROcodone (TUSSIONEX)  10-8 MG/5ML SUER Take 5 mLs by mouth every 12 (twelve) hours as needed for cough. 06/08/16  Yes Ennever, Rose PhiPeter R, MD  cyclobenzaprine (FLEXERIL) 5 MG tablet Take 5 mg by mouth 3 (three) times daily as needed for muscle spasms.   Yes [provider]  furosemide (LASIX) 20 MG tablet TAKE 1 TABLET (20 MG TOTAL) BY MOUTH DAILY AS NEEDED. 03/04/15  Yes Bradd CanaryBlyth, Stacey A, MD  gabapentin (NEURONTIN) 600 MG tablet TAKE 1 TABLET (600 MG TOTAL) BY MOUTH 3 (THREE) TIMES DAILY. 02/03/16  Yes Bradd CanaryBlyth, Stacey A, MD  hydrOXYzine (ATARAX/VISTARIL) 25 MG tablet Take 25 mg by mouth daily. 10/18/17  Yes [provider]  levothyroxine (SYNTHROID, LEVOTHROID) 25 MCG tablet TAKE 1 TABLET BY MOUTH DAILY BEFORE BREAKFAST 03/30/16  Yes Bradd CanaryBlyth, Stacey A, MD  oxyCODONE (OXY IR/ROXICODONE) 5 MG immediate release tablet Take 2 tablets (10 mg total) by mouth every 6 (six) hours as needed for severe pain. 06/08/16  Yes Venita LickBrooks, Dahari, MD  topiramate (TOPAMAX) 50 MG tablet TAKE 1 TABLET BY MOUTH 3 TIMES A DAY 02/29/16  Yes Bradd CanaryBlyth, Stacey A, MD  venlafaxine (EFFEXOR) 75 MG tablet TAKE 1 TABLET BY MOUTH 3 TIMES DAILY. 05/11/16  Yes Bradd CanaryBlyth, Stacey A, MD  ZETIA 10 MG tablet Take 10 mg by mouth daily. 10/18/17  Yes [provider]  cetirizine (ZYRTEC) 10 MG tablet Take 1 tablet (10 mg total) by mouth daily as needed for allergies or rhinitis. Patient not taking: Reported on 01/19/2018 06/15/15   Bradd CanaryBlyth, Stacey A, MD  LORazepam (ATIVAN) 2 MG tablet TAKE 1 TABLET BY MOUTH EVERY 6 HOURS AS NEEDED FOR ANXIETY Patient not taking: Reported on 01/19/2018 11/22/15   Bradd CanaryBlyth, Stacey A, MD  methocarbamol (ROBAXIN) 500 MG tablet Take 1 tablet (500 mg total) by mouth 3 (three) times daily as needed for muscle spasms. Patient not taking: Reported on 01/19/2018 06/08/16   Venita LickBrooks, Dahari, MD  metroNIDAZOLE (FLAGYL) 500 MG tablet TAKE 1 TABLET (500 MG TOTAL) BY MOUTH 3 (THREE) TIMES DAILY. Patient not taking: Reported on 01/19/2018 08/02/16   Josph MachoEnnever,  Peter R, MD  ondansetron (ZOFRAN) 4 MG tablet Take 1 tablet (4 mg total) by mouth every 8 (eight) hours as needed for nausea or vomiting. Patient not taking: Reported on 01/19/2018 06/08/16   Venita LickBrooks, Dahari, MD  orphenadrine (NORFLEX) 100 MG tablet TAKE 1 TABLET BY MOUTH EVERY MORNING AS NEEDED. Patient not taking: Reported on 01/19/2018 02/29/16   Bradd CanaryBlyth, Stacey A, MD  potassium chloride SA (K-DUR,KLOR-CON) 20 MEQ tablet Take 1 tablet (20 mEq total) by mouth as directed. Patient not taking: Reported on 01/19/2018 03/04/15   Bradd CanaryBlyth, Stacey A, MD  SUMAtriptan Willette Brace(IMITREX) 20 MG/ACT nasal  spray PLACE 1 SPRAY (20 MG TOTAL) INTO THE NOSE EVERY 2 (TWO) HOURS AS NEEDED FOR MIGRAINE OR HEADACHE. MAX OF 2 DOSES IN 24 HOURS Patient not taking: Reported on 01/19/2018 10/01/14   Bradd Canary, MD    Family History Family History  Problem Relation Age of Onset  . Diabetes Mother        typer 2  . Heart disease Mother        PVD, PAD  . Cancer Father        prostate, lung, brain mets, smoker  . COPD Father   . Hyperlipidemia Father   . Hypertension Father   . Thyroid disease Father   . Hypertension Sister   . Thyroid disease Sister   . Arthritis Sister   . Diabetes Son 16       type 1  . Diabetes Maternal Grandmother   . Stroke Maternal Grandfather   . Cancer Paternal Grandmother 85       breast  . Alcohol abuse Paternal Grandfather   . Hypertension Sister   . Obesity Sister   . Psoriasis Son   . Cancer Unknown        Breast < 20 yo, ,Prostate <50 yo  . Coronary artery disease Unknown        <60 yo  . Diabetes Unknown     Social History Social History   Tobacco Use  . Smoking status: Never Smoker  . Smokeless tobacco: Never Used  Substance Use Topics  . Alcohol use: No    Frequency: Never  . Drug use: Yes    Types: Marijuana    Comment: Rare     Allergies   Aspirin; Augmentin [amoxicillin-pot clavulanate]; Dexilant [dexlansoprazole]; Hydrocodone-acetaminophen; Influenza vaccines;  Levsin [hyoscyamine sulfate]; Linzess [linaclotide]; Promethazine; Propoxyphene n-acetaminophen; Statins; Acetaminophen; Latex; and Oxycodone-acetaminophen   Review of Systems Review of Systems  10 Systems reviewed and are negative for acute change except as noted in the HPI. Physical Exam Updated Vital Signs BP 132/80 (BP Location: Left Arm)   Pulse (!) 59   Temp (!) 97.5 F (36.4 C) (Oral)   Resp 13   Ht 5\' 8"  (1.727 m)   Wt 107 kg (236 lb)   SpO2 100%   BMI 35.88 kg/m   Physical Exam  Constitutional: She is oriented to person, place, and time.  Patient is alert.  No respiratory distress.  GCS of 15.  She is very tearful.  HENT:  Head: Normocephalic and atraumatic.  Mouth/Throat: Oropharynx is clear and moist.  Eyes: EOM are normal. Pupils are equal, round, and reactive to light.  Neck: Neck supple.  Patient reports limited range of motion due to chronic neck disorder.  Cardiovascular: Normal rate, regular rhythm, normal heart sounds and intact distal pulses.  Pulmonary/Chest: Effort normal and breath sounds normal.  Abdominal: Soft. She exhibits no distension. There is no tenderness. There is no guarding.  Musculoskeletal: Normal range of motion. She exhibits no edema, tenderness or deformity.  Neurological: She is alert and oriented to person, place, and time. No cranial nerve deficit. She exhibits normal muscle tone. Coordination normal.  Skin: Skin is warm and dry.  Psychiatric:  Very tearful and sad.     ED Treatments / Results  Labs (all labs ordered are listed, but only abnormal results are displayed) Labs Reviewed  COMPREHENSIVE METABOLIC PANEL - Abnormal; Notable for the following components:      Result Value   Potassium 3.0 (*)    Creatinine, Ser  1.02 (*)    GFR calc non Af Amer 59 (*)    All other components within normal limits  ACETAMINOPHEN LEVEL - Abnormal; Notable for the following components:   Acetaminophen (Tylenol), Serum <10 (*)    All other  components within normal limits  CBC - Abnormal; Notable for the following components:   RBC 5.75 (*)    MCV 69.4 (*)    MCH 21.9 (*)    All other components within normal limits  ETHANOL  SALICYLATE LEVEL  RAPID URINE DRUG SCREEN, HOSP PERFORMED  ACETAMINOPHEN LEVEL  I-STAT BETA HCG BLOOD, ED ([facility], [facility], [facility] ONLY)  I-STAT BETA HCG BLOOD, ED ([facility], [facility], [facility] ONLY)    EKG  EKG Interpretation None       Radiology No results found.  Procedures Procedures (including critical care time) CRITICAL CARE Performed by: Cristy Friedlander   Total critical care time: 30  minutes  Critical care time was exclusive of separately billable procedures and treating other patients.  Critical care was necessary to treat or prevent imminent or life-threatening deterioration.  Critical care was time spent personally by me on the following activities: development of treatment plan with patient and/or surrogate as well as nursing, discussions with consultants, evaluation of patient's response to treatment, examination of patient, obtaining history from patient or surrogate, ordering and performing treatments and interventions, ordering and review of laboratory studies, ordering and review of radiographic studies, pulse oximetry and re-evaluation of patient's condition. Medications Ordered in ED Medications  gabapentin (NEURONTIN) tablet 600 mg (not administered)  levothyroxine (SYNTHROID, LEVOTHROID) tablet 25 mcg (not administered)  topiramate (TOPAMAX) tablet 50 mg (not administered)  venlafaxine (EFFEXOR) tablet 75 mg (not administered)  ezetimibe (ZETIA) tablet 10 mg (not administered)  activated charcoal-sorbitol (ACTIDOSE-SORBITOL) suspension 50 g (50 g Oral Given 01/19/18 2238)     Initial Impression / Assessment and Plan / ED Course  I have reviewed the triage vital signs and the nursing notes.  Pertinent labs & imaging results that were available during my care of the patient were reviewed by me  and considered in my medical decision making (see chart for details).      Final Clinical Impressions(s) / ED Diagnoses   Final diagnoses:  Suicide attempt [facility])  Intentional drug overdose, initial encounter [facility])   Patient with depression and intentional overdose.  Patient is alert and appropriate.  No respiratory distress.  Charcoal administered per poison control recommendations.  Patient needs observation until 3 AM and then reassessment for medical clearance.  At that time plan for psychiatric consultation. ED Discharge Orders    None       Arby Barrette, MD 01/20/18 [phone]

## 2018-01-20 ENCOUNTER — Encounter (HOSPITAL_COMMUNITY): Payer: Self-pay | Admitting: Behavioral Health

## 2018-01-20 DIAGNOSIS — F332 Major depressive disorder, recurrent severe without psychotic features: Secondary | ICD-10-CM | POA: Diagnosis present

## 2018-01-20 LAB — ACETAMINOPHEN LEVEL

## 2018-01-20 MED ORDER — ZOLPIDEM TARTRATE 5 MG PO TABS: 5.0000 mg | ORAL_TABLET | Freq: Every evening | ORAL | Status: DC | PRN

## 2018-01-20 MED ORDER — LEVOTHYROXINE SODIUM 25 MCG PO TABS
25.0000 ug | ORAL_TABLET | Freq: Every day | ORAL | Status: DC
  Administered 2018-01-20: 25 ug via ORAL
  Filled 2018-01-20 (×3): qty 1

## 2018-01-20 MED ORDER — VENLAFAXINE HCL ER 75 MG PO CP24
150.0000 mg | ORAL_CAPSULE | Freq: Every day | ORAL | Status: DC
Start: 2018-01-21 — End: 2018-01-21
  Administered 2018-01-21: 150 mg via ORAL
  Filled 2018-01-20: qty 2

## 2018-01-20 MED ORDER — VENLAFAXINE HCL 75 MG PO TABS
75.0000 mg | ORAL_TABLET | Freq: Three times a day (TID) | ORAL | Status: DC
  Administered 2018-01-20: 75 mg via ORAL
  Filled 2018-01-20 (×3): qty 1

## 2018-01-20 MED ORDER — TOPIRAMATE 100 MG PO TABS
100.0000 mg | ORAL_TABLET | Freq: Two times a day (BID) | ORAL | Status: DC
  Administered 2018-01-20 – 2018-01-21 (×2): 100 mg via ORAL
  Filled 2018-01-20 (×2): qty 1

## 2018-01-20 MED ORDER — EZETIMIBE 10 MG PO TABS
10.0000 mg | ORAL_TABLET | Freq: Every day | ORAL | Status: DC
  Filled 2018-01-20: qty 1

## 2018-01-20 MED ORDER — HYDROXYZINE HCL 25 MG PO TABS
25.0000 mg | ORAL_TABLET | Freq: Every day | ORAL | Status: DC
  Administered 2018-01-20 – 2018-01-21 (×3): 25 mg via ORAL
  Filled 2018-01-20 (×3): qty 1

## 2018-01-20 MED ORDER — GABAPENTIN 300 MG PO CAPS
600.0000 mg | ORAL_CAPSULE | Freq: Three times a day (TID) | ORAL | Status: DC
Start: 2018-01-20 — End: 2018-01-21
  Administered 2018-01-20 – 2018-01-21 (×4): 600 mg via ORAL
  Filled 2018-01-20 (×4): qty 2

## 2018-01-20 MED ORDER — ONDANSETRON 4 MG PO TBDP
4.0000 mg | ORAL_TABLET | Freq: Once | ORAL | Status: AC
Start: 2018-01-20 — End: 2018-01-20
  Administered 2018-01-20: 4 mg via ORAL
  Filled 2018-01-20: qty 1

## 2018-01-20 MED ORDER — LOPERAMIDE HCL 2 MG PO CAPS
2.0000 mg | ORAL_CAPSULE | ORAL | Status: DC | PRN
  Administered 2018-01-20: 2 mg via ORAL
  Filled 2018-01-20: qty 1

## 2018-01-20 MED ORDER — TOPIRAMATE 25 MG PO TABS
50.0000 mg | ORAL_TABLET | Freq: Three times a day (TID) | ORAL | Status: DC
  Administered 2018-01-20: 50 mg via ORAL
  Filled 2018-01-20: qty 2

## 2018-01-20 MED ORDER — LEVOTHYROXINE SODIUM 25 MCG PO TABS
25.0000 ug | ORAL_TABLET | Freq: Every day | ORAL | Status: DC
  Filled 2018-01-20 (×2): qty 1

## 2018-01-20 MED ORDER — EZETIMIBE 10 MG PO TABS
10.0000 mg | ORAL_TABLET | Freq: Every day | ORAL | Status: DC
  Administered 2018-01-20: 10 mg via ORAL
  Filled 2018-01-20 (×3): qty 1

## 2018-01-20 MED ORDER — GABAPENTIN 300 MG PO CAPS
600.0000 mg | ORAL_CAPSULE | Freq: Two times a day (BID) | ORAL | Status: DC
  Administered 2018-01-20: 600 mg via ORAL
  Filled 2018-01-20: qty 2

## 2018-01-20 MED ORDER — TOPIRAMATE 100 MG PO TABS: 100.0000 mg | ORAL_TABLET | Freq: Two times a day (BID) | ORAL | Status: DC

## 2018-01-20 NOTE — BH Assessment (Signed)
[facility] Assessment Progress Note  Jamie from [facility] called to decline pt due to current increased acuity on their units.  Morrissa Shein M. Denece Shearer, MS, NCC, LPCA Counselor      

## 2018-01-20 NOTE — ED Notes (Signed)
Pt provided cup for specimen.

## 2018-01-20 NOTE — ED Notes (Signed)
Pt cleared by poison control.  

## 2018-01-20 NOTE — ED Provider Notes (Signed)
12:00 AM  Assumed care from Dr. Donnald GarrePfeiffer.  Patient is a 57 year old female who presented after intentional overdose on oxycodone and Klonopin.  Poison control recommended observation for 6 hours.  Initial labs unremarkable.  Repeat Tylenol level ordered.  TTS consulted.  2:00 AM  TTS recommends inpatient treatment.  They will seek placement.  3:00 AM  Pt has been observed for 6 hours.  No medically cleared.  Second Tylenol level is negative.  Awaiting inpatient psychiatric placement.  I reviewed all nursing notes, vitals, pertinent previous records, EKGs, lab and urine results, imaging (as available).    Chelcie Estorga, Layla MawKristen N, DO 01/20/18 [phone]
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
## 2018-01-20 NOTE — ED Notes (Signed)
PT requesting to hold PO medications until Zofran starts working.

## 2018-01-20 NOTE — ED Notes (Signed)
SBAR Report received from previous nurse. Pt received sad but compliant with directives and visible on unit. Pt denies currentHI, A/V H at this time, but endorses  depression, anxiety, and pain at this time but appears otherwise stable, though pt tearful during assessment interview and pt stated "I didn't take enough pills. I have nothing to live for." Pt reminded of camera surveillance, q 15 min rounds, and rules of the milieu. Will continue to assess.

## 2018-01-20 NOTE — ED Notes (Signed)
SBAR Report received from previous nurse. Pt received calm and visible on unit. Pt denies asleep at this time and not able to answer assessment questions related to current SI/ HI, A/V H, depression, anxiety, or pain at this time, and appears otherwise stable and free of distress. Pt reminded of camera surveillance, q 15 min rounds, and rules of the milieu. Will continue to assess.

## 2018-01-20 NOTE — ED Notes (Signed)
[facility] Expected date:  Expected time:  Means of arrival:  Comments: Micke

## 2018-01-20 NOTE — Progress Notes (Signed)
This patient continues to meet inpatient criteria. CSW fax information to the following facilities:   [place]  [facility] [place]   [facility]  [facility]  Chayce Robbins, [place] Emergency Room Clinical Social Worker [phone]

## 2018-01-20 NOTE — ED Notes (Signed)
Pt was able to urinate and a hat was placed to catch the urine and the pt did not urinate in the hat and a sample was unable to be obtained.

## 2018-01-20 NOTE — ED Notes (Signed)
Pt has been calm and cooperative today, staying in bed most of the day. She has good family support. Her sister, her niece and her two sons have visited with her.

## 2018-01-21 ENCOUNTER — Inpatient Hospital Stay (HOSPITAL_COMMUNITY)
Admission: AD | Admit: 2018-01-21 | Discharge: 2018-01-24 | DRG: 885 | Disposition: A | Discharge status: Home / Self Care | Payer: Medicaid Other | Source: Intra-hospital | Attending: Psychiatry | Admitting: Psychiatry

## 2018-01-21 ENCOUNTER — Encounter (HOSPITAL_COMMUNITY): Payer: Self-pay

## 2018-01-21 DIAGNOSIS — Z8261 Family history of arthritis: Secondary | ICD-10-CM

## 2018-01-21 DIAGNOSIS — Z590 Homelessness: Secondary | ICD-10-CM | POA: Diagnosis not present

## 2018-01-21 DIAGNOSIS — M4810 Ankylosing hyperostosis [Forestier], site unspecified: Secondary | ICD-10-CM | POA: Diagnosis present

## 2018-01-21 DIAGNOSIS — Z598 Other problems related to housing and economic circumstances: Secondary | ICD-10-CM | POA: Diagnosis not present

## 2018-01-21 DIAGNOSIS — R4587 Impulsiveness: Secondary | ICD-10-CM | POA: Diagnosis not present

## 2018-01-21 DIAGNOSIS — E039 Hypothyroidism, unspecified: Secondary | ICD-10-CM | POA: Diagnosis present

## 2018-01-21 DIAGNOSIS — F332 Major depressive disorder, recurrent severe without psychotic features: Secondary | ICD-10-CM | POA: Diagnosis present

## 2018-01-21 DIAGNOSIS — E876 Hypokalemia: Secondary | ICD-10-CM | POA: Diagnosis present

## 2018-01-21 DIAGNOSIS — E782 Mixed hyperlipidemia: Secondary | ICD-10-CM | POA: Diagnosis present

## 2018-01-21 DIAGNOSIS — Z9049 Acquired absence of other specified parts of digestive tract: Secondary | ICD-10-CM | POA: Diagnosis not present

## 2018-01-21 DIAGNOSIS — Z811 Family history of alcohol abuse and dependence: Secondary | ICD-10-CM | POA: Diagnosis not present

## 2018-01-21 DIAGNOSIS — G8929 Other chronic pain: Secondary | ICD-10-CM | POA: Diagnosis present

## 2018-01-21 DIAGNOSIS — M549 Dorsalgia, unspecified: Secondary | ICD-10-CM | POA: Diagnosis present

## 2018-01-21 DIAGNOSIS — E669 Obesity, unspecified: Secondary | ICD-10-CM | POA: Diagnosis present

## 2018-01-21 DIAGNOSIS — F419 Anxiety disorder, unspecified: Secondary | ICD-10-CM | POA: Diagnosis present

## 2018-01-21 DIAGNOSIS — G609 Hereditary and idiopathic neuropathy, unspecified: Secondary | ICD-10-CM | POA: Diagnosis present

## 2018-01-21 DIAGNOSIS — Z79899 Other long term (current) drug therapy: Secondary | ICD-10-CM | POA: Diagnosis not present

## 2018-01-21 DIAGNOSIS — Z91040 Latex allergy status: Secondary | ICD-10-CM

## 2018-01-21 DIAGNOSIS — Z8349 Family history of other endocrine, nutritional and metabolic diseases: Secondary | ICD-10-CM

## 2018-01-21 DIAGNOSIS — Z886 Allergy status to analgesic agent status: Secondary | ICD-10-CM

## 2018-01-21 DIAGNOSIS — G43909 Migraine, unspecified, not intractable, without status migrainosus: Secondary | ICD-10-CM | POA: Diagnosis present

## 2018-01-21 DIAGNOSIS — M797 Fibromyalgia: Secondary | ICD-10-CM | POA: Diagnosis present

## 2018-01-21 DIAGNOSIS — T50902A Poisoning by unspecified drugs, medicaments and biological substances, intentional self-harm, initial encounter: Secondary | ICD-10-CM | POA: Diagnosis not present

## 2018-01-21 DIAGNOSIS — Z885 Allergy status to narcotic agent status: Secondary | ICD-10-CM

## 2018-01-21 DIAGNOSIS — F1290 Cannabis use, unspecified, uncomplicated: Secondary | ICD-10-CM

## 2018-01-21 DIAGNOSIS — Z880 Allergy status to penicillin: Secondary | ICD-10-CM

## 2018-01-21 DIAGNOSIS — Z560 Unemployment, unspecified: Secondary | ICD-10-CM | POA: Diagnosis not present

## 2018-01-21 DIAGNOSIS — D649 Anemia, unspecified: Secondary | ICD-10-CM | POA: Diagnosis present

## 2018-01-21 DIAGNOSIS — Z822 Family history of deafness and hearing loss: Secondary | ICD-10-CM

## 2018-01-21 DIAGNOSIS — Z888 Allergy status to other drugs, medicaments and biological substances status: Secondary | ICD-10-CM

## 2018-01-21 DIAGNOSIS — F5104 Psychophysiologic insomnia: Secondary | ICD-10-CM | POA: Diagnosis present

## 2018-01-21 DIAGNOSIS — G894 Chronic pain syndrome: Secondary | ICD-10-CM | POA: Diagnosis not present

## 2018-01-21 DIAGNOSIS — M542 Cervicalgia: Secondary | ICD-10-CM | POA: Diagnosis present

## 2018-01-21 DIAGNOSIS — Z79891 Long term (current) use of opiate analgesic: Secondary | ICD-10-CM | POA: Diagnosis not present

## 2018-01-21 DIAGNOSIS — R45851 Suicidal ideations: Secondary | ICD-10-CM | POA: Diagnosis not present

## 2018-01-21 DIAGNOSIS — R944 Abnormal results of kidney function studies: Secondary | ICD-10-CM | POA: Diagnosis present

## 2018-01-21 DIAGNOSIS — Z79890 Hormone replacement therapy (postmenopausal): Secondary | ICD-10-CM | POA: Diagnosis not present

## 2018-01-21 DIAGNOSIS — Z6834 Body mass index (BMI) 34.0-34.9, adult: Secondary | ICD-10-CM

## 2018-01-21 DIAGNOSIS — Z90711 Acquired absence of uterus with remaining cervical stump: Secondary | ICD-10-CM

## 2018-01-21 DIAGNOSIS — Z887 Allergy status to serum and vaccine status: Secondary | ICD-10-CM

## 2018-01-21 LAB — RAPID URINE DRUG SCREEN, HOSP PERFORMED
AMPHETAMINES: NOT DETECTED
BARBITURATES: NOT DETECTED
BENZODIAZEPINES: POSITIVE — AB
COCAINE: NOT DETECTED
Opiates: NOT DETECTED
Tetrahydrocannabinol: NOT DETECTED

## 2018-01-21 LAB — URINALYSIS, ROUTINE W REFLEX MICROSCOPIC
BILIRUBIN URINE: NEGATIVE
GLUCOSE, UA: NEGATIVE mg/dL
Ketones, ur: NEGATIVE mg/dL
NITRITE: NEGATIVE
PROTEIN: 30 mg/dL — AB
Specific Gravity, Urine: 1.021 (ref 1.005–1.030)
pH: 5 (ref 5.0–8.0)

## 2018-01-21 MED ORDER — POTASSIUM CHLORIDE CRYS ER 20 MEQ PO TBCR
40.0000 meq | EXTENDED_RELEASE_TABLET | Freq: Once | ORAL | Status: AC
  Administered 2018-01-21: 40 meq via ORAL
  Filled 2018-01-21: qty 2

## 2018-01-21 MED ORDER — LEVOTHYROXINE SODIUM 25 MCG PO TABS
25.0000 ug | ORAL_TABLET | Freq: Every day | ORAL | Status: DC
  Administered 2018-01-23: 25 ug via ORAL
  Filled 2018-01-21 (×2): qty 1

## 2018-01-21 MED ORDER — ALUM & MAG HYDROXIDE-SIMETH 200-200-20 MG/5ML PO SUSP: 30.0000 mL | ORAL | Status: DC | PRN

## 2018-01-21 MED ORDER — OXYCODONE HCL 5 MG PO TABS
5.0000 mg | ORAL_TABLET | Freq: Once | ORAL | Status: AC
  Administered 2018-01-21: 5 mg via ORAL
  Filled 2018-01-21: qty 1

## 2018-01-21 MED ORDER — GABAPENTIN 300 MG PO CAPS
600.0000 mg | ORAL_CAPSULE | Freq: Three times a day (TID) | ORAL | Status: DC
  Administered 2018-01-22 – 2018-01-24 (×8): 600 mg via ORAL
  Filled 2018-01-21 (×17): qty 2

## 2018-01-21 MED ORDER — LOPERAMIDE HCL 2 MG PO CAPS: 2.0000 mg | ORAL_CAPSULE | ORAL | Status: DC | PRN

## 2018-01-21 MED ORDER — TOPIRAMATE 100 MG PO TABS
100.0000 mg | ORAL_TABLET | Freq: Two times a day (BID) | ORAL | Status: DC
  Administered 2018-01-22 – 2018-01-24 (×5): 100 mg via ORAL
  Filled 2018-01-21 (×10): qty 1

## 2018-01-21 MED ORDER — EZETIMIBE 10 MG PO TABS
10.0000 mg | ORAL_TABLET | Freq: Every day | ORAL | Status: DC
  Administered 2018-01-23 (×2): 10 mg via ORAL
  Filled 2018-01-21 (×4): qty 1

## 2018-01-21 MED ORDER — NITROFURANTOIN MONOHYD MACRO 100 MG PO CAPS
100.0000 mg | ORAL_CAPSULE | Freq: Two times a day (BID) | ORAL | Status: DC
  Administered 2018-01-21: 100 mg via ORAL
  Filled 2018-01-21 (×2): qty 1

## 2018-01-21 MED ORDER — HYDROXYZINE HCL 25 MG PO TABS
25.0000 mg | ORAL_TABLET | Freq: Three times a day (TID) | ORAL | Status: DC | PRN
  Administered 2018-01-22: 25 mg via ORAL
  Filled 2018-01-21: qty 1

## 2018-01-21 MED ORDER — HYDROXYZINE HCL 25 MG PO TABS
25.0000 mg | ORAL_TABLET | Freq: Every day | ORAL | Status: DC
  Administered 2018-01-22 – 2018-01-23 (×2): 25 mg via ORAL
  Filled 2018-01-21 (×4): qty 1

## 2018-01-21 MED ORDER — MAGNESIUM HYDROXIDE 400 MG/5ML PO SUSP: 30.0000 mL | Freq: Every day | ORAL | Status: DC | PRN

## 2018-01-21 MED ORDER — VENLAFAXINE HCL ER 150 MG PO CP24
150.0000 mg | ORAL_CAPSULE | Freq: Every day | ORAL | Status: DC
  Administered 2018-01-22: 150 mg via ORAL
  Filled 2018-01-21 (×4): qty 1

## 2018-01-21 MED ORDER — TRAZODONE HCL 50 MG PO TABS: 50.0000 mg | ORAL_TABLET | Freq: Every evening | ORAL | Status: DC | PRN

## 2018-01-21 NOTE — ED Notes (Addendum)
[facility] [place] [facility] Called [employer] for transport.

## 2018-01-21 NOTE — ED Notes (Signed)
[facility]30 pm. Patient made aware of her transfer.

## 2018-01-21 NOTE — ED Notes (Signed)
Patient left the unit ambulatory and in stable condition via law enforcement. Belongings given back to patient.

## 2018-01-21 NOTE — ED Notes (Signed)
Lab called to add on Urinalysis to previous UDS.

## 2018-01-21 NOTE — ED Notes (Signed)
Met patient crying stated "I am scared of going to [facility]". 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 She states that she is scared of going there. "the setting is different from normal hospital setting".  Patient was reassured. Support and encouragement offered. Vistaril for anxiety given.  Patient talked to the sister on the phone and feel better. In her bed at this time. Will continue to monitor patient. Safety checks maintained.

## 2018-01-21 NOTE — ED Notes (Signed)
Pt is pleasant and cooperative.  Pt denies S/I, H/I, and AVH.  Pt is compliant with her meds.  15 minute checks and video monitoring continue.

## 2018-01-21 NOTE — BH Assessment (Signed)
[facility]  Per Juanetta BeetsJacqueline Norman, DO, this pt requires psychiatric hospitalization.  Malva LimesLinsey Strader, RN, [id number]; [facility] will be ready to receive pt at 15:30.  Pt presents under IVC initiated by law enforcement, and upheld by Thedore MinsMojeed Akintayo, MD, and [facility].  Pt's nurse, Kendal Hymendie, has been notified, and agrees to call report to [phone].  Pt is to be transported via [employer] enforcement.   Doylene Canninghomas Harmani Neto, [place] Behavioral Health Coordinator [phone]
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
## 2018-01-22 ENCOUNTER — Encounter (HOSPITAL_COMMUNITY): Payer: Self-pay

## 2018-01-22 ENCOUNTER — Other Ambulatory Visit: Payer: Self-pay

## 2018-01-22 MED ORDER — NAPROXEN 500 MG PO TABS
500.0000 mg | ORAL_TABLET | Freq: Two times a day (BID) | ORAL | Status: DC
  Administered 2018-01-22 – 2018-01-23 (×2): 500 mg via ORAL
  Filled 2018-01-22 (×8): qty 1

## 2018-01-22 MED ORDER — LIDOCAINE 5 % EX PTCH
1.0000 | MEDICATED_PATCH | CUTANEOUS | Status: DC
  Administered 2018-01-22 – 2018-01-23 (×2): 1 via TRANSDERMAL
  Filled 2018-01-22 (×4): qty 1

## 2018-01-22 MED ORDER — VENLAFAXINE HCL ER 75 MG PO CP24
225.0000 mg | ORAL_CAPSULE | Freq: Every day | ORAL | Status: DC
  Administered 2018-01-23 – 2018-01-24 (×2): 225 mg via ORAL
  Filled 2018-01-22 (×5): qty 1

## 2018-01-22 MED ORDER — ARIPIPRAZOLE 2 MG PO TABS
2.0000 mg | ORAL_TABLET | Freq: Every day | ORAL | Status: DC
  Administered 2018-01-23 – 2018-01-24 (×2): 2 mg via ORAL
  Filled 2018-01-22 (×5): qty 1

## 2018-01-22 MED ORDER — MIRTAZAPINE 15 MG PO TABS
15.0000 mg | ORAL_TABLET | Freq: Every day | ORAL | Status: DC
  Administered 2018-01-23: 15 mg via ORAL
  Filled 2018-01-22 (×5): qty 1

## 2018-01-22 MED ORDER — VENLAFAXINE HCL ER 37.5 MG PO CP24
37.5000 mg | ORAL_CAPSULE | Freq: Once | ORAL | Status: AC
  Administered 2018-01-22: 37.5 mg via ORAL
  Filled 2018-01-22 (×2): qty 1
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
## 2018-01-22 NOTE — Progress Notes (Signed)
Recreation Therapy Notes  Date: 2.12.19 Time: 2:45 pm  [facility] [address] [facility] [facility]   [facility] Assumption of Risk Form signed by Patient/ or Parent Legal Guardian Yes  Patient is free of allergies or sever asthma Yes  Patient reports no fear of animals Yes  Patient reports no history of cruelty to animals Yes  Patient understands his/her participation is voluntary Yes  Patient washes hands before animal contact Yes  Patient washes hands after animal contact Yes  Behavioral Response: Engaged   Education:Hand Washing, Appropriate Animal Interaction   Education Outcome: Acknowledges education.   Clinical Observations/Feedback: Patient attended session and interacted appropriately with therapy dog and peers. Patient asked appropriate questions about therapy dog and his training. Patient shared stories about their pets at home with group.   Sheryle Hailarian Lord Lancour, Recreation Therapy Intern   Sheryle HailDarian Laurina Fischl 01/22/2018 8:48 AM

## 2018-01-22 NOTE — Progress Notes (Signed)
Heather Hanson is a 57 year old female being admitted involuntarily to [facility] from [facility].  She came to the ED for suicidal ideation.  She reported multiple stressors including medical issues, chronic pain, homelessness, no money, unable to work and few supports in the community.  [facility] was pleasant and cooperative.  She reports passive SI and will contract for safety on the unit.  She denies HI or A/V hallucinations.  She reported that she was told recently that she is having some breast issues with her right breast and "this is what sent me over the edge."  She feels hopeless, helpless, worthless, depressed and anxious.  Oriented her to the unit.  Admission paperwork completed and signed.  Belongings searched and secured in locker # 9, no contraband found.  Skin assessment completed and no skin issues noted.  Q 15 minute checks initiated for safety.  We will continue to monitor the progress towards her goals.

## 2018-01-22 NOTE — [facility] Group Notes (Signed)
[facility] Group Notes:  (Nursing/MHT/Case Management/Adjunct)  Date:  01/22/2018  Time:  5:38 PM  Type of Therapy:  Nurse Education  Participation Level:  Active  Participation Quality:  Appropriate  Affect:  Appropriate  Cognitive:  Appropriate  Insight:  Appropriate  Engagement in Group:  Engaged  Modes of Intervention:  Activity and Education  Summary of Progress/Problems:  This was a Psychoeducational group specifically related to the use of the therapy ball.  Almira Barenny G Lavell Supple 01/22/2018, 5:38 PM

## 2018-01-22 NOTE — Progress Notes (Addendum)
D: Patient is a new admit to unit.  She came in yesterday.  She has multiple medical issues, homelessness and she is attempting to get disability.  [facility].  She was expecting to her oxycodone this morning and I informed her to speak with the doctor about restarting it. She was also taking ativan "6 mg."  She states she has not slept because her "doctor took me off trazodone cold [place]."  She states her chronic illness has worsened and she is unable to work.  She denies any thoughts of self harm.  She rates her depression, hopelessness and anxiety as an 8.  Patient is visible in the milieu with minimal interaction with her peers.  Patient appears calm and her behavior is appropriate.   A: Continue to monitor medication management and MD orders.  Safety checks continued every 15 minutes per protocol.  Offer support and encouragement as needed.  R: Patient is receptive to staff.

## 2018-01-22 NOTE — H&P (Addendum)
Psychiatric Admission Assessment Adult  Patient Identification: Heather Hanson MRN:  [id number] Date of Evaluation:  01/22/2018 Chief Complaint:  Suicidal behavior Principal Diagnosis: MDD Diagnosis:   Patient Active Problem List   Diagnosis Date Noted  . MDD (major depressive disorder), recurrent severe, without psychosis (HCC) [F33.2] 01/21/2018  . Major depressive disorder, recurrent episode, severe (HCC) [F33.2] 01/20/2018  . Hereditary and idiopathic peripheral neuropathy [G60.9] 03/14/2015  . Hypothyroidism [E03.9] 03/11/2014  . Cholecystitis, acute [K81.0] 08/30/2013  . H. pylori infection [A04.8] 07/19/2013  . GERD (gastroesophageal reflux disease) [K21.9] 06/17/2013  . Headache(784.0) [R51] 05/22/2013  . Sinusitis, acute [J01.90] 04/09/2013  . Allergic state [T78.40XA] 04/09/2013  . Back pain [M54.9] 01/27/2012  . Annual physical exam [Z00.00] 09/30/2011  . Microcytosis [R71.8] 09/30/2011  . Lichen planus [L43.9] 09/30/2011  . Polyarthralgia [M25.50] 09/30/2011  . DERMATITIS [L25.9] 12/15/2010  . Constipation [K59.00] 11/23/2009  . FIBROMYALGIA [IMO0001] 11/09/2009  . NAUSEA [R11.0] 11/09/2009  . Hyperlipidemia, mixed [E78.2] 09/28/2009  . Obesity [E66.9] 09/28/2009  . Anemia [D64.9] 09/28/2009  . Depression with anxiety [F41.8] 09/28/2009  . SEIZURE DISORDER [R56.9] 09/28/2009   History of Present Illness:  57 y.o AAF, divorced, unemployed, homeless. Background history of MDD, chronic pain and multiple medical comorbidity. Presented to the ER via EMS. Overdosed on combination of Gabapentin, Oxycodone and Clonazepam. She has been waiting on disability for over three years. Reports financial difficulties, pain and poor sleep as her main stressor. Routine labs is significant for low potassium, low GFR and stable anemia.  Toxicology is negative. UDS is positive for Benzodiazepines. BAL <10 mg/dl. Patient was reported to be very distraught at presentation.  At  interview, patient reports that she has been feeling very down lately. Says she is overwhelmed by her physical condition. She does not have any income. She has spent her 401K and her pension. Says lately she has not been able to get into sleep. She bought Klonopin off the streets so she can get some sleep. Says she impulsively decided there was no point going on like this. She was at home when she overdosed. No goodbye messages.  No measures to avoid being detected. She did not research on the effects of the medications on the body. Her fiancee intervened and stopped her from taking more. Patient continues to express worthlessness and hopelessness. Says she has been feeling easily irritated lately. Little things gets underneath her skin. Feels like going off easily. Patient wants to feel well again. No associated psychosis. No evidence of mania. No overwhelming anxiety. No evidence of PTSD. No substance use. No thoughts of harming others. No thoughts of violence. She has two guns but they have been secured.     Total Time spent with patient: 1 hour  Past Psychiatric History: MDD recurrent. She has never been admitted in the past. She is followed by her PCP. She is prescribed Venlafaxine for hot flushes. She used to be prescribed Ativan as a sleep aide. No response to Trazodone. Says she slept well with Hydroxyzine yesterday.  No past history of mania. No past history of psychosis. No past history of suicidal behavior. No past history of violent behavior.    Is the patient at risk to self? Yes.    Has the patient been a risk to self in the past 6 months? No.  Has the patient been a risk to self within the distant past? No.  Is the patient a risk to others? No.  Has the patient been a risk  to others in the past 6 months? No.  Has the patient been a risk to others within the distant past? No.   Prior Inpatient Therapy:   Prior Outpatient Therapy:    Alcohol Screening: 1. How often do you have a drink  containing alcohol?: Never 2. How many drinks containing alcohol do you have on a typical day when you are drinking?: 1 or 2 3. How often do you have six or more drinks on one occasion?: Never AUDIT-C Score: 0 9. Have you or someone else been injured as a result of your drinking?: No 10. Has a relative or friend or a doctor or another health worker been concerned about your drinking or suggested you cut down?: No Alcohol Use Disorder Identification Test Final Score (AUDIT): 0 Intervention/Follow-up: AUDIT Score <7 follow-up not indicated Substance Abuse History in the last 12 months:  No. Consequences of Substance Abuse: NA Previous Psychotropic Medications: Yes  Psychological Evaluations: No  Past Medical History:  Past Medical History:  Diagnosis Date  . Adjustment disorder with mixed anxiety and depressed mood 09/28/2009   Qualifier: Diagnosis of  By: Nena Jordan   . Allergic state 04/09/2013  . Anemia    thalassemia  . Anxiety    takes Ativan daily prn  . Arthritis    sees Dr.Beekman  . Bursitis of right shoulder   . Cholecystitis, acute 08/30/2013  . Chronic insomnia   . Complication of anesthesia    hard to sedate  . Constipation    takes Linzess daily prn  . Depression    takes Effexor daily  . Difficulty swallowing solids   . Fibromyalgia   . GERD (gastroesophageal reflux disease) 06/17/2013   takes Dexilant daily prn  . H. pylori infection 07/19/2013  . H/O hiatal hernia   . Headache(784.0) 05/22/2013  . Hereditary and idiopathic peripheral neuropathy 03/14/2015  . History of bronchitis    last time many yrs ago  . History of colonoscopy 2000  . Hyperlipidemia    not on any meds   . Hypoglycemia   . Hypothyroidism   . Lichen    Dr.Fleisher at [facility]  . Migraines    takes Topamax daily-last migraine today  . Muscle spasms of head and/or neck    takes Norflex daily prn  . Nausea    takes Phenergan prn  . Obesity   . Peripheral edema    takes Lasix prn   . PONV (postoperative nausea and vomiting)    with tonsil removal surgery  . PUD (peptic ulcer disease)    Dr Loreta Ave ( H. Pyl[facility])  . Seizure disorder (HCC) x 2   presumed secondary to ativan withdrawal -Dr Sharene Skeans  . Seizures (HCC)    X 2  . Shortness of breath    pt states when Fibromyalgia flares up  . Sinusitis, acute 04/09/2013   takes Zyrtec daily    Past Surgical History:  Procedure Laterality Date  . ABDOMINAL HYSTERECTOMY  2000   partial  . CHOLECYSTECTOMY N/A 09/16/2013   Procedure: LAPAROSCOPIC CHOLECYSTECTOMY;  Surgeon: Emelia Loron, MD;  Location: [facility] OR;  Service: General;  Laterality: N/A;  . COLONOSCOPY    . REMOVAL OF CERVICAL EXOSTOSIS N/A 06/08/2016   Procedure: REMOVAL OF  EXOSTOSIS ANTERIOR CERVICAL SPINE;  Surgeon: Venita Lick, MD;  Location: [facility] OR;  Service: Orthopedics;  Laterality: N/A;  . TONSILLECTOMY  1972   Family History:  Family History  Problem Relation Age of Onset  . Diabetes Mother  typer 2  . Heart disease Mother        PVD, PAD  . Cancer Father        prostate, lung, brain mets, smoker  . COPD Father   . Hyperlipidemia Father   . Hypertension Father   . Thyroid disease Father   . Hypertension Sister   . Thyroid disease Sister   . Arthritis Sister   . Diabetes Son 16       type 1  . Diabetes Maternal Grandmother   . Stroke Maternal Grandfather   . Cancer Paternal Grandmother 6       breast  . Alcohol abuse Paternal Grandfather   . Hypertension Sister   . Obesity Sister   . Psoriasis Son   . Cancer Unknown        Breast < 72 yo, ,Prostate <50 yo  . Coronary artery disease Unknown        <60 yo  . Diabetes Unknown    Family Psychiatric  History: Denies any family history of mental illness, substance use disorder or suicide.  Tobacco Screening: Have you used any form of tobacco in the last 30 days? (Cigarettes, Smokeless Tobacco, Cigars, and/or Pipes): No Social History:  Social History   Substance and Sexual  Activity  Alcohol Use No  . Frequency: Never     Social History   Substance and Sexual Activity  Drug Use Yes  . Types: Marijuana   Comment: Rare    Additional Social History: Marital status: Divorced Divorced, when?: 1999 What types of issues is patient dealing with in the relationship?: No current relationship. Are you sexually active?: No What is your sexual orientation?: heterosexual Has your sexual activity been affected by drugs, alcohol, medication, or emotional stress?: na Does patient have children?: Yes How many children?: 2 How is patient's relationship with their children?: 2 sons, ages 33, 59.  young son is in [place] and pt eventually wants to join him.                         Allergies:   Allergies  Allergen Reactions  . Aspirin Other (See Comments)    Per Dr  . Augmentin [Amoxicillin-Pot Clavulanate] Other (See Comments)    Blisters in the back of throat  . Dexilant [Dexlansoprazole] Other (See Comments)    Bowel urgency and liquid stools  . Hydrocodone-Acetaminophen Hives    REACTION: Rash  . Influenza Vaccines Swelling    Arm swelled to the size of a grapefruit  . Levsin [Hyoscyamine Sulfate] Other (See Comments)    Projectile vomitting  . Linzess [Linaclotide] Other (See Comments)    Bowel urgency and liquid stools  . Promethazine Nausea And Vomiting  . Propoxyphene N-Acetaminophen Itching    REACTION: rash  . Statins Other (See Comments)    myalgia  . Acetaminophen Itching and Rash  . Latex Itching and Rash    Rash-looks like she has the measles per pt  . Oxycodone-Acetaminophen Itching and Rash    REACTION: Rash   Lab Results:  Results for orders placed or performed during the hospital encounter of 01/19/18 (from the past 48 hour(s))  Rapid urine drug screen (hospital performed)     Status: Abnormal   Collection Time: 01/21/18  7:58 AM  Result Value Ref Range   Opiates NONE DETECTED NONE DETECTED   Cocaine NONE DETECTED NONE  DETECTED   Benzodiazepines POSITIVE (A) NONE DETECTED   Amphetamines NONE DETECTED NONE DETECTED  Tetrahydrocannabinol NONE DETECTED NONE DETECTED   Barbiturates NONE DETECTED NONE DETECTED    Comment: (NOTE) DRUG SCREEN FOR MEDICAL PURPOSES ONLY.  IF CONFIRMATION IS NEEDED FOR ANY PURPOSE, NOTIFY LAB WITHIN 5 DAYS. LOWEST DETECTABLE LIMITS FOR URINE DRUG SCREEN Drug Class                     Cutoff (ng/mL) Amphetamine and metabolites    1000 Barbiturate and metabolites    200 Benzodiazepine                 200 Tricyclics and metabolites     300 Opiates and metabolites        300 Cocaine and metabolites        300 THC                            50 Performed at [facility], [address] [place], [place] [address]   Urinalysis, Routine w reflex microscopic     Status: Abnormal   Collection Time: 01/21/18  8:47 AM  Result Value Ref Range   Color, Urine AMBER (A) YELLOW    Comment: BIOCHEMICALS MAY BE AFFECTED BY COLOR   APPearance CLOUDY (A) CLEAR   Specific Gravity, Urine 1.021 1.005 - 1.030   pH 5.0 5.0 - 8.0   Glucose, UA NEGATIVE NEGATIVE mg/dL   Hgb urine dipstick SMALL (A) NEGATIVE   Bilirubin Urine NEGATIVE NEGATIVE   Ketones, ur NEGATIVE NEGATIVE mg/dL   Protein, ur 30 (A) NEGATIVE mg/dL   Nitrite NEGATIVE NEGATIVE   Leukocytes, UA LARGE (A) NEGATIVE   RBC / HPF 6-30 0 - 5 RBC/hpf   WBC, UA TOO NUMEROUS TO COUNT 0 - 5 WBC/hpf   Bacteria, UA RARE (A) NONE SEEN   Squamous Epithelial / LPF TOO NUMEROUS TO COUNT (A) NONE SEEN   Mucus PRESENT    Budding Yeast PRESENT     Comment: Performed at [facility], [address], [place], [place] [address]    Blood Alcohol level:  Lab Results  Component Value Date   ETH <10 01/19/2018    Metabolic Disorder Labs:  Lab Results  Component Value Date   HGBA1C 5.6 09/12/2011   MPG 114 09/12/2011   No results found for: PROLACTIN Lab Results  Component Value Date   CHOL 296  (H) 03/30/2015   TRIG 85.0 03/30/2015   HDL 77.20 03/30/2015   CHOLHDL 4 03/30/2015   VLDL 17.0 03/30/2015   LDLCALC 202 (H) 03/30/2015   LDLCALC 200 (H) 03/09/2014    Current Medications: Current Facility-Administered Medications  Medication Dose Route Frequency Provider Last Rate Last Dose  . alum & mag hydroxide-simeth (MAALOX/MYLANTA) 200-200-20 MG/5ML suspension 30 mL  30 mL Oral Q4H PRN Laveda Abbe, NP      . Melene Muller ON 01/23/2018] ARIPiprazole (ABILIFY) tablet 2 mg  2 mg Oral Daily Pailynn Vahey A, MD      . ezetimibe (ZETIA) tablet 10 mg  10 mg Oral QHS Laveda Abbe, NP      . gabapentin (NEURONTIN) capsule 600 mg  600 mg Oral TID Laveda Abbe, NP   600 mg at 01/22/18 1157  . hydrOXYzine (ATARAX/VISTARIL) tablet 25 mg  25 mg Oral TID PRN Laveda Abbe, NP      . hydrOXYzine (ATARAX/VISTARIL) tablet 25 mg  25 mg Oral Daily Laveda Abbe, NP   25 mg at  01/22/18 0754  . levothyroxine (SYNTHROID, LEVOTHROID) tablet 25 mcg  25 mcg Oral QHS Laveda AbbeParks, Laurie Britton, NP      . lidocaine (LIDODERM) 5 % 1 patch  1 patch Transdermal Q24H Avery Klingbeil A, MD      . loperamide (IMODIUM) capsule 2 mg  2 mg Oral PRN Laveda AbbeParks, Laurie Britton, NP      . magnesium hydroxide (MILK OF MAGNESIA) suspension 30 mL  30 mL Oral Daily PRN Laveda AbbeParks, Laurie Britton, NP      . mirtazapine (REMERON) tablet 15 mg  15 mg Oral QHS Harshika Mago A, MD      . naproxen (NAPROSYN) tablet 500 mg  500 mg Oral BID WC Soriya Worster A, MD      . topiramate (TOPAMAX) tablet 100 mg  100 mg Oral BID Laveda AbbeParks, Laurie Britton, NP   100 mg at 01/22/18 0754  . traZODone (DESYREL) tablet 50 mg  50 mg Oral QHS PRN Laveda AbbeParks, Laurie Britton, NP      . Melene Muller[START ON 01/23/2018] venlafaxine XR (EFFEXOR-XR) 24 hr capsule 225 mg  225 mg Oral Q breakfast Aline Wesche A, MD      . venlafaxine XR (EFFEXOR-XR) 24 hr capsule 37.5 mg  37.5 mg Oral Once Eldred Lievanos, Delight OvensVincent A, MD       PTA  Medications: Medications Prior to Admission  Medication Sig Dispense Refill Last Dose  . cetirizine (ZYRTEC) 10 MG tablet Take 1 tablet (10 mg total) by mouth daily as needed for allergies or rhinitis. (Patient not taking: Reported on 01/19/2018) 100 tablet 1 Not Taking at Unknown time  . chlorpheniramine-HYDROcodone (TUSSIONEX) 10-8 MG/5ML SUER Take 5 mLs by mouth every 12 (twelve) hours as needed for cough. 140 mL 0 Past Month at Unknown time  . cyclobenzaprine (FLEXERIL) 5 MG tablet Take 5 mg by mouth 3 (three) times daily as needed for muscle spasms.   Past Week at Unknown time  . furosemide (LASIX) 20 MG tablet TAKE 1 TABLET (20 MG TOTAL) BY MOUTH DAILY AS NEEDED. 30 tablet 6 Past Month at Unknown time  . gabapentin (NEURONTIN) 600 MG tablet TAKE 1 TABLET (600 MG TOTAL) BY MOUTH 3 (THREE) TIMES DAILY. 90 tablet 1 Past Week at Unknown time  . hydrOXYzine (ATARAX/VISTARIL) 25 MG tablet Take 25 mg by mouth daily.  99 Past Week at Unknown time  . levothyroxine (SYNTHROID, LEVOTHROID) 25 MCG tablet TAKE 1 TABLET BY MOUTH DAILY BEFORE BREAKFAST 30 tablet 0 01/19/2018 at Unknown time  . LORazepam (ATIVAN) 2 MG tablet TAKE 1 TABLET BY MOUTH EVERY 6 HOURS AS NEEDED FOR ANXIETY (Patient not taking: Reported on 01/19/2018) 120 tablet 3 Not Taking at Unknown time  . methocarbamol (ROBAXIN) 500 MG tablet Take 1 tablet (500 mg total) by mouth 3 (three) times daily as needed for muscle spasms. (Patient not taking: Reported on 01/19/2018) 60 tablet 0 Not Taking at Unknown time  . metroNIDAZOLE (FLAGYL) 500 MG tablet TAKE 1 TABLET (500 MG TOTAL) BY MOUTH 3 (THREE) TIMES DAILY. (Patient not taking: Reported on 01/19/2018) 21 tablet 1 Not Taking at Unknown time  . ondansetron (ZOFRAN) 4 MG tablet Take 1 tablet (4 mg total) by mouth every 8 (eight) hours as needed for nausea or vomiting. (Patient not taking: Reported on 01/19/2018) 20 tablet 0 Not Taking at Unknown time  . orphenadrine (NORFLEX) 100 MG tablet TAKE 1 TABLET BY  MOUTH EVERY MORNING AS NEEDED. (Patient not taking: Reported on 01/19/2018) 30 tablet 1 Not Taking at Unknown  time  . oxyCODONE (OXY IR/ROXICODONE) 5 MG immediate release tablet Take 2 tablets (10 mg total) by mouth every 6 (six) hours as needed for severe pain. 60 tablet 0 01/19/2018 at Unknown time  . potassium chloride SA (K-DUR,KLOR-CON) 20 MEQ tablet Take 1 tablet (20 mEq total) by mouth as directed. (Patient not taking: Reported on 01/19/2018) 30 tablet 6 Not Taking at Unknown time  . SUMAtriptan (IMITREX) 20 MG/ACT nasal spray PLACE 1 SPRAY (20 MG TOTAL) INTO THE NOSE EVERY 2 (TWO) HOURS AS NEEDED FOR MIGRAINE OR HEADACHE. MAX OF 2 DOSES IN 24 HOURS (Patient not taking: Reported on 01/19/2018) 1 Inhaler 1 Not Taking at Unknown time  . topiramate (TOPAMAX) 50 MG tablet TAKE 1 TABLET BY MOUTH 3 TIMES A DAY 90 tablet 2 01/18/2018 at Unknown time  . venlafaxine (EFFEXOR) 75 MG tablet TAKE 1 TABLET BY MOUTH 3 TIMES DAILY. 90 tablet 5 Past Week at Unknown time  . ZETIA 10 MG tablet Take 10 mg by mouth daily.  2 Past Week at Unknown time    Musculoskeletal: Strength & Muscle Tone: within normal limits Gait & Station: normal Patient leans: N/A  Psychiatric Specialty Exam: Physical Exam  Constitutional: She is oriented to person, place, and time. She appears well-developed and well-nourished.  HENT:  Head: Normocephalic and atraumatic.  Respiratory: Effort normal and breath sounds normal.  Neurological: She is alert and oriented to person, place, and time.  Psychiatric:  As above    ROS  Blood pressure 112/80, pulse 96, temperature 98 F (36.7 C), temperature source Oral, resp. rate 16, height 5\' 9"  (1.753 m), weight 107 kg (236 lb).Body mass index is 34.85 kg/m.  General Appearance: Neatly dressed, not in acute distress. Good relatedness. Appropriate behavior.   Eye Contact:  Good  Speech:  Clear and Coherent and Normal Rate  Volume:  Normal  Mood:  Depressed and Hopeless  Affect:  Mobilizing  some positive affect.   Thought Process:  Linear  Orientation:  Full (Time, Place, and Person)  Thought Content:  Negative ruminations about her life. Focused on pain management. Feels like going off on people. No delusional theme.  Thoughts. No hallucination in any modality.    Suicidal Thoughts:  None currently  Homicidal Thoughts:  No  Memory:  Immediate;   Good Recent;   Good Remote;   Good  Judgement:  Fair  Insight:  Good  Psychomotor Activity:  Decreased  Concentration:  Concentration: Good and Attention Span: Good  Recall:  Good  Fund of Knowledge:  Good  Language:  Good  Akathisia:  Negative  Handed:    AIMS (if indicated):     Assets:  Communication Skills Desire for Improvement Resilience  ADL's:  Intact  Cognition:  WNL  Sleep:  Number of Hours: 6    Treatment Plan Summary: Depression in context of physical pain and psychosocial stressors. Overdose was impulsive and was precipitated by insomnia. We discussed medication adjustments as listed below. She consented to treatment after we reviewed the risks and benefits.   Psychiatric: MDD Recurrent  Medical: Chronic pain DISH Migraine HLD Anemia  Psychosocial:  Financial constraints  Unemployed Limited support Medical issues  PLAN: 1. Increase Venlafaxine to 225 mg daily 2. Add Mirtazapine 15 mg HS 3. Abilify 2 mg daily 4. Naproxen BID to target pain 5. Continue Gabapentin 600 mg TID 6. Encourage unit groups and therapeutic activities 7. Monitor mood, behavior and interaction with peers 8. Motivational enhancement  9. SW would gather  collateral from her family and coordinate aftercare 10. Lidocaine patch for pain   Observation Level/Precautions:  15 minute checks  Laboratory:    Psychotherapy:    Medications:    Consultations:    Discharge Concerns:    Estimated LOS:  Other:     Physician Treatment Plan for Primary Diagnosis: <principal problem not specified> Long Term Goal(s): Improvement  in symptoms so as ready for discharge  Short Term Goals: Ability to identify changes in lifestyle to reduce recurrence of condition will improve, Ability to verbalize feelings will improve, Ability to disclose and discuss suicidal ideas, Ability to demonstrate self-control will improve, Ability to identify and develop effective coping behaviors will improve, Ability to maintain clinical measurements within normal limits will improve and Compliance with prescribed medications will improve  Physician Treatment Plan for Secondary Diagnosis: Active Problems:   MDD (major depressive disorder), recurrent severe, without psychosis (HCC)  Long Term Goal(s): Improvement in symptoms so as ready for discharge  Short Term Goals: Ability to identify changes in lifestyle to reduce recurrence of condition will improve, Ability to verbalize feelings will improve, Ability to disclose and discuss suicidal ideas, Ability to demonstrate self-control will improve, Ability to identify and develop effective coping behaviors will improve, Ability to maintain clinical measurements within normal limits will improve and Compliance with prescribed medications will improve  I certify that inpatient services furnished can reasonably be expected to improve the patient's condition.    Georgiann Cocker, MD 2/12/20195:06 PM

## 2018-01-22 NOTE — [facility] Group Notes (Signed)
Adult Psychoeducational Group Note  Date:  01/22/2018 Time:  9:03 AM  Group Topic/Focus:  Goals Group:   The focus of this group is to help patients establish daily goals to achieve during treatment and discuss how the patient can incorporate goal setting into their daily lives to aide in recovery.  Participation Level:  Active  Participation Quality:  Appropriate  Affect:  Appropriate  Cognitive:  Appropriate  Insight: Good  Engagement in Group:  Engaged  Modes of Intervention:  Orientation  Additional Comments:  Pt was alert and oriented in group. Pt goal for today is to make it through the day.  Heather Hanson 01/22/2018, 9:03 AM

## 2018-01-22 NOTE — Tx Team (Signed)
Initial Treatment Plan 01/22/2018 1:16 AM Heather FeeGwendolyn A Madril ZOX:[id number]    PATIENT STRESSORS: Financial difficulties Health problems Occupational concerns   PATIENT STRENGTHS: Capable of independent living Wellsite geologistCommunication skills General fund of knowledge Motivation for treatment/growth   PATIENT IDENTIFIED PROBLEMS: Depression  Suicidal ideation  "At this point, I don't know what my goals are"                 DISCHARGE CRITERIA:  Improved stabilization in mood, thinking, and/or behavior Verbal commitment to aftercare and medication compliance  PRELIMINARY DISCHARGE PLAN: Outpatient therapy Medication management  PATIENT/FAMILY INVOLVEMENT: This treatment plan has been presented to and reviewed with the patient, Heather Hanson.  The patient and family have been given the opportunity to ask questions and make suggestions.  Levin BaconHeather V Richerd Grime, RN 01/22/2018, 1:16 AM

## 2018-01-22 NOTE — [facility] Group Notes (Signed)
Pt was invited but did not attend group. 
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
## 2018-01-23 DIAGNOSIS — F332 Major depressive disorder, recurrent severe without psychotic features: Principal | ICD-10-CM

## 2018-01-23 LAB — TSH: TSH: 0.775 u[IU]/mL (ref 0.350–4.500)

## 2018-01-23 MED ORDER — HYDROXYZINE HCL 50 MG PO TABS
50.0000 mg | ORAL_TABLET | Freq: Three times a day (TID) | ORAL | Status: DC | PRN
  Administered 2018-01-23 – 2018-01-24 (×4): 50 mg via ORAL
  Filled 2018-01-23 (×4): qty 1

## 2018-01-23 MED ORDER — OXYCODONE HCL 5 MG PO TABS
5.0000 mg | ORAL_TABLET | Freq: Three times a day (TID) | ORAL | Status: DC | PRN
  Administered 2018-01-23 – 2018-01-24 (×4): 5 mg via ORAL
  Filled 2018-01-23 (×4): qty 1

## 2018-01-23 MED ORDER — LEVOTHYROXINE SODIUM 25 MCG PO TABS
25.0000 ug | ORAL_TABLET | Freq: Every day | ORAL | Status: DC
  Administered 2018-01-24: 25 ug via ORAL
  Filled 2018-01-23 (×3): qty 1

## 2018-01-23 NOTE — Progress Notes (Signed)
Recreation Therapy Notes  Date: 01/23/18 Time: 0930 Location: [address] Dayroom  Group Topic: Stress Management  Goal Area(s) Addresses:  Patient will verbalize importance of using healthy stress management.  Patient will identify positive emotions associated with healthy stress management.   Intervention: Stress Management  Activity :  LRT introduced the stress management technique of guided imagery.  LRT read a script to guide patients to envision their peaceful place. Patients were to follow along as LRT read script.  Education:  Stress Management, Discharge Planning.   Education Outcome: Acknowledges edcuation/In group clarification offered/Needs additional education  Clinical Observations/Feedback: Pt did not attend group.    Caroll RancherMarjette Ridhi Hoffert, LRT/CTRS         Caroll RancherLindsay, Deedee Lybarger A 01/23/2018 11:39 AM

## 2018-01-23 NOTE — [facility] Suicide Risk Assessment (Signed)
[facility] INPATIENT:  Family/Significant Other Suicide Prevention Education  Suicide Prevention Education:  Contact Attempts: patient's sister, Sande Brothersamela Short [phone]), has been identified by the patient as the family member/significant other with whom the patient will be residing, and identified as the person(s) who will aid the patient in the event of a mental health crisis.  With written consent from the patient, two attempts were made to provide suicide prevention education, prior to and/or following the patient's discharge.  We were unsuccessful in providing suicide prevention education.  A suicide education pamphlet was given to the patient to share with family/significant other.   Date and time of second attempt: 01/23/18 at 2:10pm  Heather Hanson 01/23/2018, 2:13 PM

## 2018-01-23 NOTE — [facility] Suicide Risk Assessment (Signed)
[facility] INPATIENT:  Family/Significant Other Suicide Prevention Education  Suicide Prevention Education:  Contact Attempts: patient's sister, Sande Brothersamela Short [phone]), has been identified by the patient as the family member/significant other with whom the patient will be residing, and identified as the person(s) who will aid the patient in the event of a mental health crisis.  With written consent from the patient, two attempts were made to provide suicide prevention education, prior to and/or following the patient's discharge.  We were unsuccessful in providing suicide prevention education.  A suicide education pamphlet was given to the patient to share with family/significant other.  Date and time of first attempt: 01/23/18 at 10:00am  Writer left a voicemail requesting a returned phone call.  Darreld McleanCharlotte C Zuzu Befort 01/23/2018, 9:57 AM

## 2018-01-23 NOTE — Progress Notes (Signed)
[facility]  01/23/2018 3:27 PM Rachael FeeGwendolyn A Irmen  MRN:  [id number]   Subjective:  Patient reports that she is irritated because she has not received her pain medications and her mood revolves around her pain. She states "[place] [facility] [place]. I still go there."   Objective: Patient's chart and findings reviewed and discussed with treatment team. Patient present in the milieu and appears agitated and irritable. [facility] is contacts and Misty confirms that the patient does take Oxycodone HCL 5 mg TID and is an active patient. Patient is restarted on Oxycodone HCL 5 mg TID PRN [facility]. Also due to increased anxiety will increase Vistaril 50 mg PO TID PRN.   Principal Problem: MDD (major depressive disorder), recurrent severe, without psychosis (HCC) Diagnosis:   Patient Active Problem List   Diagnosis Date Noted  . MDD (major depressive disorder), recurrent severe, without psychosis (HCC) [F33.2] 01/21/2018  . Major depressive disorder, recurrent episode, severe (HCC) [F33.2] 01/20/2018  . Hereditary and idiopathic peripheral neuropathy [G60.9] 03/14/2015  . Hypothyroidism [E03.9] 03/11/2014  . Cholecystitis, acute [K81.0] 08/30/2013  . H. pylori infection [A04.8] 07/19/2013  . GERD (gastroesophageal reflux disease) [K21.9] 06/17/2013  . Headache(784.0) [R51] 05/22/2013  . Sinusitis, acute [J01.90] 04/09/2013  . Allergic state [T78.40XA] 04/09/2013  . Back pain [M54.9] 01/27/2012  . Annual physical exam [Z00.00] 09/30/2011  . Microcytosis [R71.8] 09/30/2011  . Lichen planus [L43.9] 09/30/2011  . Polyarthralgia [M25.50] 09/30/2011  . DERMATITIS [L25.9] 12/15/2010  . Constipation [K59.00] 11/23/2009  . FIBROMYALGIA [IMO0001] 11/09/2009  . NAUSEA [R11.0] 11/09/2009  . Hyperlipidemia, mixed [E78.2] 09/28/2009  . Obesity [E66.9] 09/28/2009  . Anemia [D64.9] 09/28/2009  . Depression with anxiety [F41.8] 09/28/2009  . SEIZURE DISORDER  [R56.9] 09/28/2009   Total Time spent with patient: 25 minutes  Past Psychiatric History: See H&P  Past Medical History:  Past Medical History:  Diagnosis Date  . Adjustment disorder with mixed anxiety and depressed mood 09/28/2009   Qualifier: Diagnosis of  By: Nena JordanYoo DO, D. Robert   . Allergic state 04/09/2013  . Anemia    thalassemia  . Anxiety    takes Ativan daily prn  . Arthritis    sees Dr.Beekman  . Bursitis of right shoulder   . Cholecystitis, acute 08/30/2013  . Chronic insomnia   . Complication of anesthesia    hard to sedate  . Constipation    takes Linzess daily prn  . Depression    takes Effexor daily  . Difficulty swallowing solids   . Fibromyalgia   . GERD (gastroesophageal reflux disease) 06/17/2013   takes Dexilant daily prn  . H. pylori infection 07/19/2013  . H/O hiatal hernia   . Headache(784.0) 05/22/2013  . Hereditary and idiopathic peripheral neuropathy 03/14/2015  . History of bronchitis    last time many yrs ago  . History of colonoscopy 2000  . Hyperlipidemia    not on any meds   . Hypoglycemia   . Hypothyroidism   . [facility]hen    [facility]  . Migraines    takes Topamax daily-last migraine today  . Muscle spasms of head and/or neck    takes Norflex daily prn  . Nausea    takes Phenergan prn  . Obesity   . Peripheral edema    takes Lasix prn  . PONV (postoperative nausea and vomiting)    with tonsil removal surgery  . PUD (peptic ulcer disease)    Dr Loreta AveMann (  H. Pylori)  . Seizure disorder (HCC) x 2   presumed secondary to ativan withdrawal -Dr Sharene Skeans  . Seizures (HCC)    X 2  . Shortness of breath    pt states when Fibromyalgia flares up  . Sinusitis, acute 04/09/2013   takes Zyrtec daily    Past Surgical History:  Procedure Laterality Date  . ABDOMINAL HYSTERECTOMY  2000   partial  . CHOLECYSTECTOMY N/A 09/16/2013   Procedure: LAPAROSCOPIC CHOLECYSTECTOMY;  Surgeon: Emelia Loron, MD;  Location: [facility] OR;  Service:  General;  Laterality: N/A;  . COLONOSCOPY    . REMOVAL OF CERVICAL EXOSTOSIS N/A 06/08/2016   Procedure: REMOVAL OF  EXOSTOSIS ANTERIOR CERVICAL SPINE;  Surgeon: Venita Lick, MD;  Location: [facility] OR;  Service: Orthopedics;  Laterality: N/A;  . TONSILLECTOMY  1972   Family History:  Family History  Problem Relation Age of Onset  . Diabetes Mother        typer 2  . Heart disease Mother        PVD, PAD  . Cancer Father        prostate, lung, brain mets, smoker  . COPD Father   . Hyperlipidemia Father   . Hypertension Father   . Thyroid disease Father   . Hypertension Sister   . Thyroid disease Sister   . Arthritis Sister   . Diabetes Son 16       type 1  . Diabetes Maternal Grandmother   . Stroke Maternal Grandfather   . Cancer Paternal Grandmother 48       breast  . Alcohol abuse Paternal Grandfather   . Hypertension Sister   . Obesity Sister   . Psoriasis Son   . Cancer Unknown        Breast < 74 yo, ,Prostate <50 yo  . Coronary artery disease Unknown        <60 yo  . Diabetes Unknown    Family Psychiatric  History: See H&P Social History:  Social History   Substance and Sexual Activity  Alcohol Use No  . Frequency: Never     Social History   Substance and Sexual Activity  Drug Use Yes  . Types: Marijuana   Comment: Rare    Social History   Socioeconomic History  . Marital status: Divorced    Spouse name: None  . Number of children: 2  . Years of education: None  . Highest education level: None  Social Needs  . Financial resource strain: None  . Food insecurity - worry: None  . Food insecurity - inability: None  . Transportation needs - medical: None  . Transportation needs - non-medical: None  Occupational History  . Occupation: LAB TECH    Employer: [employer]    Comment: phlebotomist  Tobacco Use  . Smoking status: Never Smoker  . Smokeless tobacco: Never Used  Substance and Sexual Activity  . Alcohol use: No    Frequency: Never  .  Drug use: Yes    Types: Marijuana    Comment: Rare  . Sexual activity: Yes    Birth control/protection: Surgical    Comment: lives with,   Other Topics Concern  . None  Social History Narrative   Occupation: Water quality scientist   Divorced     2 sons 26, 64      Never Smoked     Alcohol use-no      Additional Social History:  Sleep: Good  Appetite:  Good  Current Medications: Current Facility-Administered Medications  Medication Dose Route Frequency Provider Last Rate Last Dose  . alum & mag hydroxide-simeth (MAALOX/MYLANTA) 200-200-20 MG/5ML suspension 30 mL  30 mL Oral Q4H PRN Laveda Abbe, NP      . ARIPiprazole (ABILIFY) tablet 2 mg  2 mg Oral Daily Izediuno, Delight Ovens, MD   2 mg at 01/23/18 0803  . ezetimibe (ZETIA) tablet 10 mg  10 mg Oral QHS Laveda Abbe, NP   10 mg at 01/23/18 [phone]  . gabapentin (NEURONTIN) capsule 600 mg  600 mg Oral TID Laveda Abbe, NP   600 mg at 01/23/18 1154  . hydrOXYzine (ATARAX/VISTARIL) tablet 50 mg  50 mg Oral TID PRN Rodolfo Gaster, Gerlene Burdock, FNP      . [START ON 01/24/2018] levothyroxine (SYNTHROID, LEVOTHROID) tablet 25 mcg  25 mcg Oral QAC breakfast Dewaun Kinzler, Feliz Beam B, FNP      . lidocaine (LIDODERM) 5 % 1 patch  1 patch Transdermal Q24H Izediuno, Delight Ovens, MD   1 patch at 01/23/18 1122  . loperamide (IMODIUM) capsule 2 mg  2 mg Oral PRN Laveda Abbe, NP      . magnesium hydroxide (MILK OF MAGNESIA) suspension 30 mL  30 mL Oral Daily PRN Laveda Abbe, NP      . mirtazapine (REMERON) tablet 15 mg  15 mg Oral QHS Izediuno, Vincent A, MD      . naproxen (NAPROSYN) tablet 500 mg  500 mg Oral BID WC Izediuno, Delight Ovens, MD   500 mg at 01/23/18 0804  . oxyCODONE (Oxy IR/ROXICODONE) immediate release tablet 5 mg  5 mg Oral TID PRN Mannat Benedetti, Gerlene Burdock, FNP   5 mg at 01/23/18 1154  . topiramate (TOPAMAX) tablet 100 mg  100 mg Oral BID Laveda Abbe, NP   100 mg at 01/23/18 0804  .  traZODone (DESYREL) tablet 50 mg  50 mg Oral QHS PRN Laveda Abbe, NP      . venlafaxine XR [facility]) 24 hr capsule 225 mg  225 mg Oral Q breakfast Izediuno, Delight Ovens, MD   225 mg at 01/23/18 9604    Lab Results: No results found for this or any previous visit (from the past 48 hour(s)).  Blood Alcohol level:  Lab Results  Component Value Date   ETH <10 01/19/2018    Metabolic Disorder Labs: Lab Results  Component Value Date   HGBA1C 5.6 09/12/2011   MPG 114 09/12/2011   No results found for: PROLACTIN Lab Results  Component Value Date   CHOL 296 (H) 03/30/2015   TRIG 85.0 03/30/2015   HDL 77.20 03/30/2015   CHOLHDL 4 03/30/2015   VLDL 17.0 03/30/2015   LDLCALC 202 (H) 03/30/2015   LDLCALC 200 (H) 03/09/2014    Physical Findings: AIMS: Facial and Oral Movements Muscles of Facial Expression: None, normal Lips and Perioral Area: None, normal Jaw: None, normal Tongue: None, normal,Extremity Movements Upper (arms, wrists, hands, fingers): None, normal Lower (legs, knees, ankles, toes): None, normal, Trunk Movements Neck, shoulders, hips: None, normal, Overall Severity Severity of abnormal movements (highest score from questions above): None, normal Incapacitation due to abnormal movements: None, normal Patient's awareness of abnormal movements (rate only patient's report): No Awareness, Dental Status Current problems with teeth and/or dentures?: No Does patient usually wear dentures?: No  CIWA:    COWS:     Musculoskeletal: Strength & Muscle Tone: within normal limits Gait & Station: normal  Patient leans: N/A  Psychiatric Specialty Exam: Physical Exam  Nursing note and vitals reviewed. Constitutional: She is oriented to person, place, and time. She appears well-developed and well-nourished.  Cardiovascular: Normal rate.  Respiratory: Effort normal.  Musculoskeletal: Normal range of motion.  Neurological: She is alert and oriented to person, place,  and time.  Skin: Skin is warm.    Review of Systems  Constitutional: Negative.   HENT: Negative.   Eyes: Negative.   Respiratory: Negative.   Cardiovascular: Negative.   Gastrointestinal: Negative.   Genitourinary: Negative.   Musculoskeletal: Positive for neck pain.  Skin: Negative.   Neurological: Negative.   Endo/Heme/Allergies: Negative.   Psychiatric/Behavioral: Positive for depression. Negative for hallucinations and suicidal ideas. The patient is nervous/anxious.     Blood pressure (!) 117/92, pulse 97, temperature 98.4 F (36.9 C), temperature source Oral, resp. rate 18, height 5\' 9"  (1.753 m), weight 107 kg (236 lb).Body mass index is 34.85 kg/m.  General Appearance: Casual  Eye Contact:  Good  Speech:  Clear and Coherent and Normal Rate  Volume:  Normal  Mood:  Irritable  Affect:  Depressed and Flat  Thought Process:  Goal Directed and Descriptions of Associations: Intact  Orientation:  Full (Time, Place, and Person)  Thought Content:  WDL  Suicidal Thoughts:  No  Homicidal Thoughts:  No  Memory:  Immediate;   Good Recent;   Good Remote;   Good  Judgement:  Good  Insight:  Good  Psychomotor Activity:  Normal  Concentration:  Concentration: Good and Attention Span: Good  Recall:  Good  Fund of Knowledge:  Good  Language:  Good  Akathisia:  No  Handed:  Right  AIMS (if indicated):     Assets:  Communication Skills Desire for Improvement Financial Resources/Insurance Physical Health Social Support Transportation  ADL's:  Intact  Cognition:  WNL  Sleep:  Number of Hours: 6.5   Problems Addressed: MDD severe Back pain  Treatment Plan Summary: Daily contact with patient to assess and evaluate symptoms and progress in treatment, Medication management and Plan is to:  -Continue Effexor-XR 225 mg PO Daily for mood stability -Continue Trazodone 50 mg PO QHS PRN for insomnia -Continue Topamax 100 mg PO BID  -Continue Remeron 15 mg PO QHS for mood  stability -Start Oxycodone 5 mg PO TID PRN for neck pain -Continue Abilify 2 mg PO Daily for mood stability -Continue Neurontin 600 mg PO TID for pain -Encourage group therapy participation  Maryfrances Bunnell, FNP 01/23/2018, 3:27 PM

## 2018-01-23 NOTE — [facility] Group Notes (Signed)
[facility] Group Notes:  (Nursing/MHT/Case Management/Adjunct)  Date:  01/23/2018  Time:  4:00 PM  Type of Therapy:  Nurse Education  Participation Level:  Did Not Attend   Summary of Progress/Problems:  This was an educational group focusing on the importance of training the brain with positive messages and replacing negative messages, especially those that  are deeply imprinted and play without active thought.  Heather Hanson 01/23/2018, 4:00 PM

## 2018-01-23 NOTE — Tx Team (Signed)
Interdisciplinary Treatment and Diagnostic Plan Update  01/23/2018 Time of Session: 10:00a Heather Hanson MRN: [id number]  Principal Diagnosis: MDD (major depressive disorder), recurrent severe, without psychosis (Heather Hanson)  Secondary Diagnoses: Principal Problem:   MDD (major depressive disorder), recurrent severe, without psychosis (Heather Hanson)   Current Medications:  Current Facility-Administered Medications  Medication Dose Route Frequency Provider Last Rate Last Dose  . alum & mag hydroxide-simeth (MAALOX/MYLANTA) 200-200-20 MG/5ML suspension 30 mL  30 mL Oral Q4H PRN Ethelene Hal, NP      . ARIPiprazole (ABILIFY) tablet 2 mg  2 mg Oral Daily Izediuno, Laruth Bouchard, MD   2 mg at 01/23/18 0803  . ezetimibe (ZETIA) tablet 10 mg  10 mg Oral QHS Ethelene Hal, NP   10 mg at 01/23/18 [phone]  . gabapentin (NEURONTIN) capsule 600 mg  600 mg Oral TID Ethelene Hal, NP   600 mg at 01/23/18 0804  . hydrOXYzine (ATARAX/VISTARIL) tablet 25 mg  25 mg Oral TID PRN Ethelene Hal, NP   25 mg at 01/22/18 1712  . hydrOXYzine (ATARAX/VISTARIL) tablet 25 mg  25 mg Oral Daily Ethelene Hal, NP   25 mg at 01/23/18 0804  . levothyroxine (SYNTHROID, LEVOTHROID) tablet 25 mcg  25 mcg Oral QHS Ethelene Hal, NP   25 mcg at 01/23/18 [id number]  . lidocaine (LIDODERM) 5 % 1 patch  1 patch Transdermal Q24H Izediuno, Laruth Bouchard, MD   1 patch at 01/22/18 1730  . loperamide (IMODIUM) capsule 2 mg  2 mg Oral PRN Ethelene Hal, NP      . magnesium hydroxide (MILK OF MAGNESIA) suspension 30 mL  30 mL Oral Daily PRN Ethelene Hal, NP      . mirtazapine (REMERON) tablet 15 mg  15 mg Oral QHS Izediuno, Vincent A, MD      . naproxen (NAPROSYN) tablet 500 mg  500 mg Oral BID WC Izediuno, Laruth Bouchard, MD   500 mg at 01/23/18 0804  . topiramate (TOPAMAX) tablet 100 mg  100 mg Oral BID Ethelene Hal, NP   100 mg at 01/23/18 0804  . traZODone (DESYREL) tablet 50 mg  50 mg Oral  QHS PRN Ethelene Hal, NP      . venlafaxine XR [facility]) 24 hr capsule 225 mg  225 mg Oral Q breakfast Izediuno, Laruth Bouchard, MD   225 mg at 01/23/18 3086   PTA Medications: Medications Prior to Admission  Medication Sig Dispense Refill Last Dose  . cetirizine (ZYRTEC) 10 MG tablet Take 1 tablet (10 mg total) by mouth daily as needed for allergies or rhinitis. (Patient not taking: Reported on 01/19/2018) 100 tablet 1 Not Taking at Unknown time  . chlorpheniramine-HYDROcodone (TUSSIONEX) 10-8 MG/5ML SUER Take 5 mLs by mouth every 12 (twelve) hours as needed for cough. 140 mL 0 Past Month at Unknown time  . cyclobenzaprine (FLEXERIL) 5 MG tablet Take 5 mg by mouth 3 (three) times daily as needed for muscle spasms.   Past Week at Unknown time  . furosemide (LASIX) 20 MG tablet TAKE 1 TABLET (20 MG TOTAL) BY MOUTH DAILY AS NEEDED. 30 tablet 6 Past Month at Unknown time  . gabapentin (NEURONTIN) 600 MG tablet TAKE 1 TABLET (600 MG TOTAL) BY MOUTH 3 (THREE) TIMES DAILY. 90 tablet 1 Past Week at Unknown time  . hydrOXYzine (ATARAX/VISTARIL) 25 MG tablet Take 25 mg by mouth daily.  99 Past Week at Unknown time  . levothyroxine (SYNTHROID, LEVOTHROID) 25 MCG  tablet TAKE 1 TABLET BY MOUTH DAILY BEFORE BREAKFAST 30 tablet 0 01/19/2018 at Unknown time  . LORazepam (ATIVAN) 2 MG tablet TAKE 1 TABLET BY MOUTH EVERY 6 HOURS AS NEEDED FOR ANXIETY (Patient not taking: Reported on 01/19/2018) 120 tablet 3 Not Taking at Unknown time  . methocarbamol (ROBAXIN) 500 MG tablet Take 1 tablet (500 mg total) by mouth 3 (three) times daily as needed for muscle spasms. (Patient not taking: Reported on 01/19/2018) 60 tablet 0 Not Taking at Unknown time  . metroNIDAZOLE (FLAGYL) 500 MG tablet TAKE 1 TABLET (500 MG TOTAL) BY MOUTH 3 (THREE) TIMES DAILY. (Patient not taking: Reported on 01/19/2018) 21 tablet 1 Not Taking at Unknown time  . ondansetron (ZOFRAN) 4 MG tablet Take 1 tablet (4 mg total) by mouth every 8 (eight) hours as  needed for nausea or vomiting. (Patient not taking: Reported on 01/19/2018) 20 tablet 0 Not Taking at Unknown time  . orphenadrine (NORFLEX) 100 MG tablet TAKE 1 TABLET BY MOUTH EVERY MORNING AS NEEDED. (Patient not taking: Reported on 01/19/2018) 30 tablet 1 Not Taking at Unknown time  . oxyCODONE (OXY IR/ROXICODONE) 5 MG immediate release tablet Take 2 tablets (10 mg total) by mouth every 6 (six) hours as needed for severe pain. 60 tablet 0 01/19/2018 at Unknown time  . potassium chloride SA (K-DUR,KLOR-CON) 20 MEQ tablet Take 1 tablet (20 mEq total) by mouth as directed. (Patient not taking: Reported on 01/19/2018) 30 tablet 6 Not Taking at Unknown time  . SUMAtriptan (IMITREX) 20 MG/ACT nasal spray PLACE 1 SPRAY (20 MG TOTAL) INTO THE NOSE EVERY 2 (TWO) HOURS AS NEEDED FOR MIGRAINE OR HEADACHE. MAX OF 2 DOSES IN 24 HOURS (Patient not taking: Reported on 01/19/2018) 1 Inhaler 1 Not Taking at Unknown time  . topiramate (TOPAMAX) 50 MG tablet TAKE 1 TABLET BY MOUTH 3 TIMES A DAY 90 tablet 2 01/18/2018 at Unknown time  . venlafaxine (EFFEXOR) 75 MG tablet TAKE 1 TABLET BY MOUTH 3 TIMES DAILY. 90 tablet 5 Past Week at Unknown time  . ZETIA 10 MG tablet Take 10 mg by mouth daily.  2 Past Week at Unknown time    Patient Stressors: Financial difficulties Health problems Occupational concerns  Patient Strengths: Capable of independent living Curator fund of knowledge Motivation for treatment/growth  Treatment Modalities: Medication Management, Group therapy, Case management,  1 to 1 session with clinician, Psychoeducation, Recreational therapy.   Physician Treatment Plan for Primary Diagnosis: MDD (major depressive disorder), recurrent severe, without psychosis ([place]) Long Term Goal(s): Improvement in symptoms so as ready for discharge Improvement in symptoms so as ready for discharge   Short Term Goals: Ability to identify changes in lifestyle to reduce recurrence of condition will  improve Ability to verbalize feelings will improve Ability to disclose and discuss suicidal ideas Ability to demonstrate self-control will improve Ability to identify and develop effective coping behaviors will improve Ability to maintain clinical measurements within normal limits will improve Compliance with prescribed medications will improve Ability to identify changes in lifestyle to reduce recurrence of condition will improve Ability to verbalize feelings will improve Ability to disclose and discuss suicidal ideas Ability to demonstrate self-control will improve Ability to identify and develop effective coping behaviors will improve Ability to maintain clinical measurements within normal limits will improve Compliance with prescribed medications will improve  Medication Management: Evaluate patient's response, side effects, and tolerance of medication regimen.  Therapeutic Interventions: 1 to 1 sessions, Unit Group sessions and Medication administration.  Evaluation of Outcomes: Not Met  Physician Treatment Plan for Secondary Diagnosis: Principal Problem:   MDD (major depressive disorder), recurrent severe, without psychosis ([place])  Long Term Goal(s): Improvement in symptoms so as ready for discharge Improvement in symptoms so as ready for discharge   Short Term Goals: Ability to identify changes in lifestyle to reduce recurrence of condition will improve Ability to verbalize feelings will improve Ability to disclose and discuss suicidal ideas Ability to demonstrate self-control will improve Ability to identify and develop effective coping behaviors will improve Ability to maintain clinical measurements within normal limits will improve Compliance with prescribed medications will improve Ability to identify changes in lifestyle to reduce recurrence of condition will improve Ability to verbalize feelings will improve Ability to disclose and discuss suicidal ideas Ability to  demonstrate self-control will improve Ability to identify and develop effective coping behaviors will improve Ability to maintain clinical measurements within normal limits will improve Compliance with prescribed medications will improve     Medication Management: Evaluate patient's response, side effects, and tolerance of medication regimen.  Therapeutic Interventions: 1 to 1 sessions, Unit Group sessions and Medication administration.  Evaluation of Outcomes: Not Met   RN Treatment Plan for Primary Diagnosis: MDD (major depressive disorder), recurrent severe, without psychosis ([place]) Long Term Goal(s): Knowledge of disease and therapeutic regimen to maintain health will improve  Short Term Goals: Ability to remain free from injury will improve, Ability to verbalize frustration and anger appropriately will improve, Ability to demonstrate self-control, Ability to participate in decision making will improve, Ability to verbalize feelings will improve, Ability to disclose and discuss suicidal ideas, Ability to identify and develop effective coping behaviors will improve and Compliance with prescribed medications will improve  Medication Management: RN will administer medications as ordered by provider, will assess and evaluate patient's response and provide education to patient for prescribed medication. RN will report any adverse and/or side effects to prescribing provider.  Therapeutic Interventions: 1 on 1 counseling sessions, Psychoeducation, Medication administration, Evaluate responses to treatment, Monitor vital signs and CBGs as ordered, Perform/monitor CIWA, COWS, AIMS and Fall Risk screenings as ordered, Perform wound care treatments as ordered.  Evaluation of Outcomes: Not Met   LCSW Treatment Plan for Primary Diagnosis: MDD (major depressive disorder), recurrent severe, without psychosis ([place]) Long Term Goal(s): Safe transition to appropriate next level of care at discharge, Engage  patient in therapeutic group addressing interpersonal concerns.  Short Term Goals: Engage patient in aftercare planning with referrals and resources, Increase social support, Increase ability to appropriately verbalize feelings, Increase emotional regulation, Facilitate acceptance of mental health diagnosis and concerns, Facilitate patient progression through stages of change regarding substance use diagnoses and concerns, Identify triggers associated with mental health/substance abuse issues and Increase skills for wellness and recovery  Therapeutic Interventions: Assess for all discharge needs, 1 to 1 time with Social worker, Explore available resources and support systems, Assess for adequacy in community support network, Educate family and significant other(s) on suicide prevention, Complete Psychosocial Assessment, Interpersonal group therapy.  Evaluation of Outcomes: Not Met   Progress in Treatment: Attending groups: Yes. Participating in groups: Yes. Minimal Taking medication as prescribed: Yes. Toleration medication: Yes.  Family/Significant other contact made: No, will contact:  CSW attempted to contact the patient's sister. No answer. CSW wil continue to follow.   Patient understands diagnosis: Yes. Discussing patient identified problems/goals with staff: Yes. Medical problems stabilized or resolved: Yes. Denies suicidal/homicidal ideation: Yes. Issues/concerns per patient self-inventory: No. Other:   New  problem(s) identified: NONE  New Short Term/Long Term Goal(s): medication stabilization, elimination of SI thoughts, development of comprehensive mental wellness plan.   Patient Goal: "To get well and go home"    Discharge Plan or Barriers: Return home with her sister, and follow up with [facility].   Reason for Continuation of Hospitalization: Anxiety Depression Medication stabilization Suicidal ideation  Estimated Length of Stay: 01/29/18  Attendees: Patient:  Heather Hanson 01/23/2018 9:05 AM  Physician: Dr. Leanord Hawking 01/23/2018 9:05 AM  Nursing: Chrys Racer, RN 01/23/2018 9:05 AM  RN Care Manager: 01/23/2018 9:05 AM  Social Worker: Radonna Ricker, [place] 01/23/2018 9:05 AM  Recreational Therapist:  01/23/2018 9:05 AM  Other:  01/23/2018 9:05 AM  Other:  01/23/2018 9:05 AM  Other: 01/23/2018 9:05 AM    Scribe for Treatment Team: Marylee Floras, [place] 01/23/2018 9:05 AM

## 2018-01-23 NOTE — Progress Notes (Signed)
D: Patient complained of generalized pain.  She is complaining of ongoing agitation.  Her vistaril was increased to 50 mg.  She is also complaining on ongoing constipation and colace.  She rates her depression as a 5; hopelessness as an 8; anxiety as a 1.  She is sleeping and eating well; her energy level is normal and her concentration is poor.  She denies any thoughts of self harm.  Her goal today is to work on "anger management."  She also plans to "talk to MD about pain medication."  Patient was restarted on oxy 5 mg prn.  A: Continue to monitor medication management and MD orders.  Safety checks continued every 15 minutes per protocol.  Offer support and education as needed.  R: Patient is receptive to staff.  She has elected to stay in her room the rest of the afternoon.

## 2018-01-23 NOTE — Progress Notes (Signed)
Pt did not attend wrap-up group   

## 2018-01-23 NOTE — [facility] Group Notes (Signed)
[facility] [employer] Group Therapy      01/23/2018 12:13 PM  Type of Therapy: Mental Health Association Presentation  Participation Level: Active  Participation Quality: Attentive  Affect: Appropriate  Cognitive: Oriented  Insight: Developing/Improving  Engagement in Therapy: Engaged  Modes of Intervention: Discussion, Education and Socialization  Summary of Progress/Problems: Mental Health Association (MHA) Speaker came to talk about his personal journey with mental health. The pt processed ways by which to relate to the speaker. MHA speaker provided handouts and educational information pertaining to groups and services offered by the MHA. Pt was engaged in speaker's presentation and was receptive to resources provided.    Marquel Pottenger LCSWA Clinical Social Worker   

## 2018-01-24 MED ORDER — TOPIRAMATE 100 MG PO TABS: 100.0000 mg | ORAL_TABLET | Freq: Two times a day (BID) | ORAL | 0 refills | Status: DC

## 2018-01-24 MED ORDER — GABAPENTIN 300 MG PO CAPS: 600.0000 mg | ORAL_CAPSULE | Freq: Three times a day (TID) | ORAL | 0 refills | Status: DC

## 2018-01-24 MED ORDER — VENLAFAXINE HCL ER 75 MG PO CP24: 225.0000 mg | ORAL_CAPSULE | Freq: Every day | ORAL | 0 refills | Status: DC

## 2018-01-24 MED ORDER — ARIPIPRAZOLE 2 MG PO TABS: 2.0000 mg | ORAL_TABLET | Freq: Every day | ORAL | 0 refills | Status: DC

## 2018-01-24 MED ORDER — MIRTAZAPINE 15 MG PO TABS: 15.0000 mg | ORAL_TABLET | Freq: Every day | ORAL | 0 refills | Status: DC

## 2018-01-24 MED ORDER — HYDROXYZINE HCL 50 MG PO TABS: 50.0000 mg | ORAL_TABLET | Freq: Three times a day (TID) | ORAL | 0 refills | Status: DC | PRN

## 2018-01-24 MED ORDER — DOCUSATE SODIUM 100 MG PO CAPS
100.0000 mg | ORAL_CAPSULE | Freq: Two times a day (BID) | ORAL | Status: DC
  Administered 2018-01-24: 100 mg via ORAL
  Filled 2018-01-24 (×5): qty 1

## 2018-01-24 MED FILL — hydrOXYzine HCL 50 MG TABS: 50 | 10 days supply | Qty: 30 | Fill #0

## 2018-01-24 MED FILL — GABAPENTIN 300 MG CAPSULE: 300 | 30 days supply | Qty: 180 | Fill #0

## 2018-01-24 MED FILL — VENLAFAXINE HCL ER 75 MG CA: 75 | 30 days supply | Qty: 90 | Fill #0

## 2018-01-24 MED FILL — TOPIRAMATE 100 MG TABLET: 100 | 30 days supply | Qty: 60 | Fill #0

## 2018-01-24 MED FILL — MIRTAZAPINE 15 MG TAB: 15 | 30 days supply | Qty: 30 | Fill #0

## 2018-01-24 MED FILL — ARIPiprazole 2 MG TABS: 2 | 30 days supply | Qty: 30 | Fill #0

## 2018-01-24 NOTE — [facility] Group Notes (Signed)
Adult Psychoeducational Group Note  Date:  01/24/2018 Time:  9:07 AM  Group Topic/Focus:  Goals Group:   The focus of this group is to help patients establish daily goals to achieve during treatment and discuss how the patient can incorporate goal setting into their daily lives to aide in recovery.  Participation Level:  Active  Participation Quality:  Appropriate  Affect:  Appropriate  Cognitive:  Appropriate  Insight: Appropriate  Engagement in Group:  Engaged  Modes of Intervention:  Orientation  Additional Comments: Pt was participated in goals/orientation group. Pt goal for today is to stay positive.   Dellia NimsJaquesha M Jackalynn Art 01/24/2018, 9:07 AM

## 2018-01-24 NOTE — [facility] Suicide Risk Assessment (Addendum)
[facility] Discharge Suicide Risk Assessment   Principal Problem: MDD (major depressive disorder), recurrent severe, without psychosis (HCC) Discharge Diagnoses:  Patient Active Problem List   Diagnosis Date Noted  . MDD (major depressive disorder), recurrent severe, without psychosis (HCC) [F33.2] 01/21/2018  . Major depressive disorder, recurrent episode, severe (HCC) [F33.2] 01/20/2018  . Hereditary and idiopathic peripheral neuropathy [G60.9] 03/14/2015  . Hypothyroidism [E03.9] 03/11/2014  . Cholecystitis, acute [K81.0] 08/30/2013  . H. pylori infection [A04.8] 07/19/2013  . GERD (gastroesophageal reflux disease) [K21.9] 06/17/2013  . Headache(784.0) [R51] 05/22/2013  . Sinusitis, acute [J01.90] 04/09/2013  . Allergic state [T78.40XA] 04/09/2013  . Back pain [M54.9] 01/27/2012  . Annual physical exam [Z00.00] 09/30/2011  . Microcytosis [R71.8] 09/30/2011  . Lichen planus [L43.9] 09/30/2011  . Polyarthralgia [M25.50] 09/30/2011  . DERMATITIS [L25.9] 12/15/2010  . Constipation [K59.00] 11/23/2009  . FIBROMYALGIA [IMO0001] 11/09/2009  . NAUSEA [R11.0] 11/09/2009  . Hyperlipidemia, mixed [E78.2] 09/28/2009  . Obesity [E66.9] 09/28/2009  . Anemia [D64.9] 09/28/2009  . Depression with anxiety [F41.8] 09/28/2009  . SEIZURE DISORDER [R56.9] 09/28/2009    Total Time spent with patient: 45 minutes  Musculoskeletal: Strength & Muscle Tone: within normal limits Gait & Station: normal Patient leans: N/A  Psychiatric Specialty Exam: Review of Systems  Constitutional: Negative.   HENT: Negative.   Eyes: Negative.   Respiratory: Negative.   Cardiovascular: Negative.   Gastrointestinal: Negative.   Genitourinary: Negative.   Musculoskeletal: Negative.   Skin: Negative.   Neurological: Negative.   Endo/Heme/Allergies: Negative.   Psychiatric/Behavioral: Negative for depression, hallucinations, memory loss, substance abuse and suicidal ideas. The patient is not nervous/anxious and  does not have insomnia.     Blood pressure (!) 117/92, pulse 97, temperature 98.4 F (36.9 C), temperature source Oral, resp. rate 18, height 5\' 9"  (1.753 m), weight 107 kg (236 lb).Body mass index is 34.85 kg/m.  General Appearance: Neatly dressed, pleasant, engaging well and cooperative. Appropriate behavior. Not in any distress. Good relatedness. Not internally stimulated.  Eye Contact::  Good  Speech:  Spontaneous, normal prosody. Normal tone and rate.   Volume:  Normal  Mood:  Euthymic  Affect:  Appropriate and Full Range  Thought Process:  Linear  Orientation:  Full (Time, Place, and Person)  Thought Content: Future oriented. No delusional theme. No preoccupation with violent thoughts. No negative ruminations. No obsession.  No hallucination in any modality.   Suicidal Thoughts:  No  Homicidal Thoughts:  No  Memory:  Immediate;   Good Recent;   Good Remote;   Good  Judgement:  Good  Insight:  Good  Psychomotor Activity:  Normal  Concentration:  Good  Recall:  Good  Fund of Knowledge:Good  Language: Good  Akathisia:  Negative  Handed:    AIMS (if indicated):     Assets:  Communication Skills Desire for Improvement Housing Resilience Social Support  Sleep:  Number of Hours: 6.75  Cognition: WNL  ADL's:  Intact   Mental Status Per Nursing Assessment::   57 y.o AAF, divorced, unemployed, homeless. Background history of MDD, chronic pain and multiple medical comorbidity. Presented to the ER via EMS. Overdosed on combination of Gabapentin, Oxycodone and Clonazepam. She has been waiting on disability for over three years. Reports financial difficulties, pain and poor sleep as her main stressor. Routine labs is significant for low potassium, low GFR and stable anemia.  Toxicology is negative. UDS is positive for Benzodiazepines. BAL <10 mg/dl. Patient was reported to be very distraught at presentation.  Seen  today. Says she is feeling better. She is missing her dog. She is  tolerating her medications well. She can no longer stay with the family member she was staying with before. Another sister would take her in. SW has confirmed her sister can pick her up today. She is sleeping well. Her energy levels are back to normal. She is able to think clearly. He is able to focus on task. His thoughts are not crowded or racing. No evidence of mania. No hallucination in any modality. He is not making any delusional statement. No passivity of will/thought. He is fully in touch with reality. No thoughts of suicide. No thoughts of homicide. No violent thoughts. No overwhelming anxiety. No access to weapons.  Nursing staff reports that patient has been appropriate on the unit. Patient has been interacting well with peers. No behavioral issues. Patient has not voiced any suicidal thoughts. Patient has not been observed to be internally stimulated. Patient has been adherent with treatment recommendations. Patient has been tolerating their medication well.   Patient was discussed at team. Team members feels that patient is back to her baseline level of function. Team agrees with plan to discharge patient today.    Demographic Factors:  Divorced or widowed, Low socioeconomic status and Unemployed  Loss Factors: Decline in physical health and Financial problems/change in socioeconomic status  Historical Factors: Impulsivity  Risk Reduction Factors:   Sense of responsibility to family, Living with another person, especially a relative, Positive social support, Positive therapeutic relationship and Positive coping skills or problem solving skills  Continued Clinical Symptoms:  As above   Cognitive Features That Contribute To Risk:  None    Suicide Risk:  Minimal: No identifiable suicidal ideation.  Patient is not having any thoughts of suicide at this time. Modifiable risk factors targeted during this admission includes depression and chronic pain. Demographical and historical  risk factors cannot be modified. Patient is now engaging well. Patient is reliable and is future oriented. We have buffered patient's support structures. At this point, patient is at low risk of suicide. Patient is aware of the effects of psychoactive substances on decision making process. Patient has been provided with emergency contacts. Patient acknowledges to use resources provided if unforseen circumstances changes their current risk stratification.    Follow-up Information    [facility] Follow up on 01/30/2018.   Why:  Intake appointment 01/30/18 at 8:30am with Nyra JabsFrancis Gill. Please bring photo ID and medicaid card. Contact information: [address] [place] [address] [phone]           Plan Of Care/Follow-up recommendations:  1. Continue current psychotropic medications 2. Mental health and addiction follow up as arranged.  3. Discharge in care of her family 4. Provided limited quantity of prescriptions   Georgiann CockerVincent A Sharlet Notaro, MD 01/24/2018, 11:18 AM

## 2018-01-24 NOTE — Progress Notes (Signed)
Discharge note:  Patient discharged home per MD order.  Patient received all personal belongings from the unit and locker.  Reviewed AVS/transition record with her and she indicated understanding.  She denies any thoughts of self harm.  Patient plans on living with her sister and following up with [facility].  Patient is leaving with prescriptions of her medications.  Patient left ambulatory with her sister.

## 2018-01-24 NOTE — [facility] Suicide Risk Assessment (Signed)
[facility] INPATIENT:  Family/Significant Other Suicide Prevention Education  Suicide Prevention Education:  Education Completed; Heather Brothersamela Hanson (sister, [phone]) has been identified by the patient as the family member/significant other with whom the patient will be residing, and identified as the person(s) who will aid the patient in the event of a mental health crisis (suicidal ideations/suicide attempt).  With written consent from the patient, the family member/significant other has been provided the following suicide prevention education, prior to the and/or following the discharge of the patient.  The suicide prevention education provided includes the following:  Suicide risk factors  Suicide prevention and interventions  National Suicide Hotline telephone number  [facility] assessment telephone number  [facility] Emergency Assistance 911  [facility] and/or [facility]idential [facility] telephone number  Request made of family/significant other to:  Remove weapons (e.g., guns, rifles, knives), all items previously/currently identified as safety concern.    Remove drugs/medications (over-the-counter, prescriptions, illicit drugs), all items previously/currently identified as a safety concern.  The family member/significant other verbalizes understanding of the suicide prevention education information provided.  The family member/significant other agrees to remove the items of safety concern listed above.  Heather Hanson 01/24/2018, 11:02 AM

## 2018-01-24 NOTE — Plan of Care (Signed)
  Completed/Met Activity: Interest or engagement in activities will improve 01/24/2018 1254 - Completed/Met by Joice Lofts, RN Sleeping patterns will improve 01/24/2018 1254 - Completed/Met by Joice Lofts, RN Education: Knowledge of [place] Education information/materials will improve 01/24/2018 1254 - Completed/Met by Joice Lofts, RN Emotional status will improve 01/24/2018 1254 - Completed/Met by Joice Lofts, RN Mental status will improve 01/24/2018 1254 - Completed/Met by Joice Lofts, RN Verbalization of understanding the information provided will improve 01/24/2018 1254 - Completed/Met by Joice Lofts, RN Coping: Ability to verbalize frustrations and anger appropriately will improve 01/24/2018 1254 - Completed/Met by Joice Lofts, RN Ability to demonstrate self-control will improve 01/24/2018 1254 - Completed/Met by Joice Lofts, RN Health Behavior/Discharge Planning: Identification of resources available to assist in meeting health care needs will improve 01/24/2018 1254 - Completed/Met by Joice Lofts, RN Compliance with treatment plan for underlying cause of condition will improve 01/24/2018 1254 - Completed/Met by Joice Lofts, RN Physical Regulation: Ability to maintain clinical measurements within normal limits will improve 01/24/2018 1254 - Completed/Met by Joice Lofts, RN Safety: Periods of time without injury will increase 01/24/2018 1254 - Completed/Met by Joice Lofts, RN Activity: Interest or engagement in leisure activities will improve 01/24/2018 1254 - Completed/Met by Joice Lofts, RN Imbalance in normal sleep/wake cycle will improve 01/24/2018 1254 - Completed/Met by Joice Lofts, RN Education: Utilization of techniques to improve thought processes will improve 01/24/2018 1254 - Completed/Met by Joice Lofts, RN Knowledge of the prescribed therapeutic  regimen will improve 01/24/2018 1254 - Completed/Met by Joice Lofts, RN Coping: Ability to cope will improve 01/24/2018 1254 - Completed/Met by Joice Lofts, RN Ability to verbalize feelings will improve 01/24/2018 1254 - Completed/Met by Joice Lofts, [place] Behavior/Discharge Planning: Ability to make decisions will improve 01/24/2018 1254 - Completed/Met by Joice Lofts, RN Compliance with therapeutic regimen will improve 01/24/2018 1254 - Completed/Met by Joice Lofts, RN Role Relationship: Ability to demonstrate positive changes in social behaviors and relationships will improve 01/24/2018 1254 - Completed/Met by Joice Lofts, RN Safety: Ability to disclose and discuss suicidal ideas will improve 01/24/2018 1254 - Completed/Met by Joice Lofts, RN Ability to identify and utilize support systems that promote safety will improve 01/24/2018 1254 - Completed/Met by Joice Lofts, RN Self-Concept: Ability to verbalize positive feelings about self will improve 01/24/2018 1254 - Completed/Met by Joice Lofts, RN Level of anxiety will decrease 01/24/2018 1254 - Completed/Met by Joice Lofts, RN Education: Ability to make informed decisions regarding treatment will improve 01/24/2018 1254 - Completed/Met by Joice Lofts, RN Coping: Ability to cope will improve 01/24/2018 1254 - Completed/Met by Joice Lofts, [place] Behavior/Discharge Planning: Identification of resources available to assist in meeting health care needs will improve 01/24/2018 1254 - Completed/Met by Joice Lofts, RN Medication: Compliance with prescribed medication regimen will improve 01/24/2018 1254 - Completed/Met by Joice Lofts, RN Self-Concept: Ability to disclose and discuss suicidal ideas will improve 01/24/2018 1254 - Completed/Met by Joice Lofts, RN Ability to verbalize positive feelings about self will  improve 01/24/2018 1254 - Completed/Met by Joice Lofts, RN Spiritual Needs Ability to function at adequate level 01/24/2018 1254 - Completed/Met by Joice Lofts, RN

## 2018-01-24 NOTE — Progress Notes (Signed)
  [facility] :  Will you be returning to the same living situation after discharge:  No. Patient will live with her sister, Sande Brothersamela [place], [place] At discharge, do you have transportation home?: Yes,  patient's sister will pick her up around 1:00pm Do you have the ability to pay for your medications: No.  Release of information consent forms completed and in the chart;  Patient's signature needed at discharge.  Patient to Follow up at: Follow-up Information    [facility] Follow up on 01/30/2018.   Why:  Intake appointment 01/30/18 at 8:30am with Nyra JabsFrancis Gill. Please bring photo ID and medicaid card. Contact information: [address] [place] [id number] [phone]           Next level of care provider has access to [facility] Link:yes  Safety Planning and Suicide Prevention discussed: Yes,  with the patient's sister  Have you used any form of tobacco in the last 30 days? (Cigarettes, Smokeless Tobacco, Cigars, and/or Pipes): No  Has patient been referred to the Quitline?: N/A patient is not a smoker  Patient has been referred for addiction treatment: N/A  Maeola SarahJolan E Janai Brannigan, LCSWA 01/24/2018, 11:15 AM

## 2018-01-24 NOTE — Discharge Summary (Signed)
Physician Discharge Summary Note  Patient:  Heather Hanson is an 57 y.o., female MRN:  [id number] DOB:  12-31-1960 Patient phone:  [phone] (home)  Patient address:   [address] [place] [address],  Total Time spent with patient: 20 minutes  Date of Admission:  01/21/2018 Date of Discharge: 01/24/18  Reason for Admission:  Overdose Attempt  Principal Problem: MDD (major depressive disorder), recurrent severe, without psychosis [facility]) Discharge Diagnoses: Patient Active Problem List   Diagnosis Date Noted  . MDD (major depressive disorder), recurrent severe, without psychosis (HCC) [F33.2] 01/21/2018  . Major depressive disorder, recurrent episode, severe (HCC) [F33.2] 01/20/2018  . Hereditary and idiopathic peripheral neuropathy [G60.9] 03/14/2015  . Hypothyroidism [E03.9] 03/11/2014  . Cholecystitis, acute [K81.0] 08/30/2013  . H. pylori infection [A04.8] 07/19/2013  . GERD (gastroesophageal reflux disease) [K21.9] 06/17/2013  . Headache(784.0) [R51] 05/22/2013  . Sinusitis, acute [J01.90] 04/09/2013  . Allergic state [T78.40XA] 04/09/2013  . Back pain [M54.9] 01/27/2012  . Annual physical exam [Z00.00] 09/30/2011  . Microcytosis [R71.8] 09/30/2011  . Lichen planus [L43.9] 09/30/2011  . Polyarthralgia [M25.50] 09/30/2011  . DERMATITIS [L25.9] 12/15/2010  . Constipation [K59.00] 11/23/2009  . FIBROMYALGIA [IMO0001] 11/09/2009  . NAUSEA [R11.0] 11/09/2009  . Hyperlipidemia, mixed [E78.2] 09/28/2009  . Obesity [E66.9] 09/28/2009  . Anemia [D64.9] 09/28/2009  . Depression with anxiety [F41.8] 09/28/2009  . SEIZURE DISORDER [R56.9] 09/28/2009    Past Psychiatric History: MDD recurrent. She has never been admitted in the past. She is followed by her PCP. She is prescribed Venlafaxine for hot flushes. She used to be prescribed Ativan as a sleep aide. No response to Trazodone. Says she slept well with Hydroxyzine yesterday.  No past history of mania. No past  history of psychosis. No past history of suicidal behavior. No past history of violent behavior  Past Medical History:  Past Medical History:  Diagnosis Date  . Adjustment disorder with mixed anxiety and depressed mood 09/28/2009   Qualifier: Diagnosis of  By: Nena Jordan   . Allergic state 04/09/2013  . Anemia    thalassemia  . Anxiety    takes Ativan daily prn  . Arthritis    sees Dr.Beekman  . Bursitis of right shoulder   . Cholecystitis, acute 08/30/2013  . Chronic insomnia   . Complication of anesthesia    hard to sedate  . Constipation    takes Linzess daily prn  . Depression    takes Effexor daily  . Difficulty swallowing solids   . Fibromyalgia   . GERD (gastroesophageal reflux disease) 06/17/2013   takes Dexilant daily prn  . H. pylori infection 07/19/2013  . H/O hiatal hernia   . Headache(784.0) 05/22/2013  . Hereditary and idiopathic peripheral neuropathy 03/14/2015  . History of bronchitis    last time many yrs ago  . History of colonoscopy 2000  . Hyperlipidemia    not on any meds   . Hypoglycemia   . Hypothyroidism   . Lichen    [facility]Fleisher [facility]  . Migraines    takes Topamax daily-last migraine today  . Muscle spasms of head and/or neck    takes Norflex daily prn  . Nausea    takes Phenergan prn  . Obesity   . Peripheral edema    takes Lasix prn  . PONV (postoperative nausea and vomiting)    with tonsil removal surgery  . PUD (peptic ulcer disease)    Dr Loreta Ave ( [facility])  .  Seizure disorder (HCC) x 2   presumed secondary to ativan withdrawal -Dr Sharene Skeans  . Seizures (HCC)    X 2  . Shortness of breath    pt states when Fibromyalgia flares up  . Sinusitis, acute 04/09/2013   takes Zyrtec daily    Past Surgical History:  Procedure Laterality Date  . ABDOMINAL HYSTERECTOMY  2000   partial  . CHOLECYSTECTOMY N/A 09/16/2013   Procedure: LAPAROSCOPIC CHOLECYSTECTOMY;  Surgeon: Emelia Loron, MD;  Location: [facility] OR;  Service: General;   Laterality: N/A;  . COLONOSCOPY    . REMOVAL OF CERVICAL EXOSTOSIS N/A 06/08/2016   Procedure: REMOVAL OF  EXOSTOSIS ANTERIOR CERVICAL SPINE;  Surgeon: Venita Lick, MD;  Location: [facility] OR;  Service: Orthopedics;  Laterality: N/A;  . TONSILLECTOMY  1972   Family History:  Family History  Problem Relation Age of Onset  . Diabetes Mother        typer 2  . Heart disease Mother        PVD, PAD  . Cancer Father        prostate, lung, brain mets, smoker  . COPD Father   . Hyperlipidemia Father   . Hypertension Father   . Thyroid disease Father   . Hypertension Sister   . Thyroid disease Sister   . Arthritis Sister   . Diabetes Son 16       type 1  . Diabetes Maternal Grandmother   . Stroke Maternal Grandfather   . Cancer Paternal Grandmother 49       breast  . Alcohol abuse Paternal Grandfather   . Hypertension Sister   . Obesity Sister   . Psoriasis Son   . Cancer Unknown        Breast < 56 yo, ,Prostate <50 yo  . Coronary artery disease Unknown        <60 yo  . Diabetes Unknown    Family Psychiatric  History: Denies any family history of mental illness, substance use disorder or suicide.   Social History:  Social History   Substance and Sexual Activity  Alcohol Use No  . Frequency: Never     Social History   Substance and Sexual Activity  Drug Use Yes  . Types: Marijuana   Comment: Rare    Social History   Socioeconomic History  . Marital status: Divorced    Spouse name: None  . Number of children: 2  . Years of education: None  . Highest education level: None  Social Needs  . Financial resource strain: None  . Food insecurity - worry: None  . Food insecurity - inability: None  . Transportation needs - medical: None  . Transportation needs - non-medical: None  Occupational History  . Occupation: LAB TECH    Employer: [employer]    Comment: phlebotomist  Tobacco Use  . Smoking status: Never Smoker  . Smokeless tobacco: Never Used  Substance  and Sexual Activity  . Alcohol use: No    Frequency: Never  . Drug use: Yes    Types: Marijuana    Comment: Rare  . Sexual activity: Yes    Birth control/protection: Surgical    Comment: lives with,   Other Topics Concern  . None  Social History Narrative   Occupation: Water quality scientist   Divorced     2 sons 26, 2      Never Smoked     Alcohol use-no       Hospital Course:   01/22/18 [facility]  MD Assessment: 57 y.o AAF, divorced, unemployed, homeless. Background history of MDD, chronic pain and multiple medical comorbidity. Presented to the ER via EMS. Overdosed on combination of Gabapentin, Oxycodone and Clonazepam. She has been waiting on disability for over three years. Reports financial difficulties, pain and poor sleep as her main stressor. Routine labs is significant for low potassium, low GFR and stable anemia.  Toxicology is negative. UDS is positive for Benzodiazepines. BAL <10 mg/dl. Patient was reported to be very distraught at presentation. At interview, patient reports that she has been feeling very down lately. Says she is overwhelmed by her physical condition. She does not have any income. She has spent her 401K and her pension. Says lately she has not been able to get into sleep. She bought Klonopin off the streets so she can get some sleep. Says she impulsively decided there was no point going on like this. She was at home when she overdosed. No goodbye messages.  No measures to avoid being detected. She did not research on the effects of the medications on the body. Her fiancee intervened and stopped her from taking more. Patient continues to express worthlessness and hopelessness. Says she has been feeling easily irritated lately. Little things gets underneath her skin. Feels like going off easily. Patient wants to feel well again. No associated psychosis. No evidence of mania. No overwhelming anxiety. No evidence of PTSD. No substance use. No thoughts of harming others. No thoughts of  violence. She has two guns but they have been secured.  Patient remained on the [facility] unit for 2 days and stabilized with medication and therapy. Patient was continued on Effexor-XR 225 mg Daily, Abilify 2 mg Daily, Gabapentin 600 mg TID, Remeron 15 mg QHS, Trazodone 50 mg QHS PRN, Topamax 100 mg BID, and Vistaril 50 mg TID PRN. Patient has shown improvement with improved mood, affect, sleep, appetite, and interaction. Patient has been seen in the day room interacting with peers and staff appropriately. Patient has been attending groups and participating. Patient denies any SI/HI/AVH and contracts for safety. Patient is provided with prescriptions for her medications upon discharge.    Physical Findings: AIMS: Facial and Oral Movements Muscles of Facial Expression: None, normal Lips and Perioral Area: None, normal Jaw: None, normal Tongue: None, normal,Extremity Movements Upper (arms, wrists, hands, fingers): None, normal Lower (legs, knees, ankles, toes): None, normal, Trunk Movements Neck, shoulders, hips: None, normal, Overall Severity Severity of abnormal movements (highest score from questions above): None, normal Incapacitation due to abnormal movements: None, normal Patient's awareness of abnormal movements (rate only patient's report): No Awareness, Dental Status Current problems with teeth and/or dentures?: No Does patient usually wear dentures?: No  CIWA:    COWS:     Musculoskeletal: Strength & Muscle Tone: within normal limits Gait & Station: normal Patient leans: N/A  Psychiatric Specialty Exam: Physical Exam  Nursing note and vitals reviewed. Constitutional: She is oriented to person, place, and time. She appears well-developed and well-nourished.  Cardiovascular: Normal rate.  Respiratory: Effort normal.  Musculoskeletal: Normal range of motion.  Neurological: She is alert and oriented to person, place, and time.  Skin: Skin is warm.    Review of Systems   Constitutional: Negative.   HENT: Negative.   Eyes: Negative.   Respiratory: Negative.   Cardiovascular: Negative.   Gastrointestinal: Negative.   Genitourinary: Negative.   Musculoskeletal: Negative.   Skin: Negative.   Neurological: Negative.   Endo/Heme/Allergies: Negative.   Psychiatric/Behavioral: Negative.  Blood pressure (!) 117/92, pulse 97, temperature 98.4 F (36.9 C), temperature source Oral, resp. rate 18, height 5\' 9"  (1.753 m), weight 107 kg (236 lb).Body mass index is 34.85 kg/m.  General Appearance: Casual  Eye Contact:  Good  Speech:  Clear and Coherent and Normal Rate  Volume:  Normal  Mood:  Euthymic  Affect:  Congruent  Thought Process:  Goal Directed and Descriptions of Associations: Intact  Orientation:  Full (Time, Place, and Person)  Thought Content:  WDL  Suicidal Thoughts:  No  Homicidal Thoughts:  No  Memory:  Immediate;   Good Recent;   Good Remote;   Good  Judgement:  Good  Insight:  Good  Psychomotor Activity:  Normal  Concentration:  Concentration: Good and Attention Span: Good  Recall:  Good  Fund of Knowledge:  Good  Language:  Good  Akathisia:  No  Handed:  Right  AIMS (if indicated):     Assets:  Communication Skills Desire for Improvement Financial Resources/Insurance Housing Physical Health Social Support Transportation  ADL's:  Intact  Cognition:  WNL  Sleep:  Number of Hours: 6.75     Have you used any form of tobacco in the last 30 days? (Cigarettes, Smokeless Tobacco, Cigars, and/or Pipes): No  Has this patient used any form of tobacco in the last 30 days? (Cigarettes, Smokeless Tobacco, Cigars, and/or Pipes) Yes, No  Blood Alcohol level:  Lab Results  Component Value Date   ETH <10 01/19/2018    Metabolic Disorder Labs:  Lab Results  Component Value Date   HGBA1C 5.6 09/12/2011   MPG 114 09/12/2011   No results found for: PROLACTIN Lab Results  Component Value Date   CHOL 296 (H) 03/30/2015   TRIG  85.0 03/30/2015   HDL 77.20 03/30/2015   CHOLHDL 4 03/30/2015   VLDL 17.0 03/30/2015   LDLCALC 202 (H) 03/30/2015   LDLCALC 200 (H) 03/09/2014    See Psychiatric Specialty Exam and Suicide Risk Assessment completed by Attending Physician prior to discharge.  Discharge destination:  Home  Is patient on multiple antipsychotic therapies at discharge:  No   Has Patient had three or more failed trials of antipsychotic monotherapy by history:  No  Recommended Plan for Multiple Antipsychotic Therapies: NA   Allergies as of 01/24/2018      Reactions   Aspirin Other (See Comments)   Per Dr   Augmentin [amoxicillin-pot Clavulanate] Other (See Comments)   Blisters in the back of throat   Dexilant [dexlansoprazole] Other (See Comments)   Bowel urgency and liquid stools   Hydrocodone-acetaminophen Hives   REACTION: Rash   Influenza Vaccines Swelling   Arm swelled to the size of a grapefruit   Levsin [hyoscyamine Sulfate] Other (See Comments)   Projectile vomitting   Linzess [linaclotide] Other (See Comments)   Bowel urgency and liquid stools   Promethazine Nausea And Vomiting   Propoxyphene N-acetaminophen Itching   REACTION: rash   Statins Other (See Comments)   myalgia   Acetaminophen Itching, Rash   Latex Itching, Rash   Rash-looks like she has the measles per pt   Oxycodone-acetaminophen Itching, Rash   REACTION: Rash      Medication List    STOP taking these medications   cetirizine 10 MG tablet Commonly known as:  ZYRTEC   chlorpheniramine-HYDROcodone 10-8 MG/5ML Suer Commonly known as:  TUSSIONEX   cyclobenzaprine 5 MG tablet Commonly known as:  FLEXERIL   furosemide 20 MG tablet Commonly known as:  LASIX  gabapentin 600 MG tablet Commonly known as:  NEURONTIN Replaced by:  gabapentin 300 MG capsule   LORazepam 2 MG tablet Commonly known as:  ATIVAN   methocarbamol 500 MG tablet Commonly known as:  ROBAXIN   metroNIDAZOLE 500 MG tablet Commonly known  as:  FLAGYL   ondansetron 4 MG tablet Commonly known as:  ZOFRAN   orphenadrine 100 MG tablet Commonly known as:  NORFLEX   potassium chloride SA 20 MEQ tablet Commonly known as:  K-DUR,KLOR-CON   SUMAtriptan 20 MG/ACT nasal spray Commonly known as:  IMITREX   venlafaxine 75 MG tablet Commonly known as:  EFFEXOR Replaced by:  venlafaxine XR 75 MG 24 hr capsule     TAKE these medications     Indication  ARIPiprazole 2 MG tablet Commonly known as:  ABILIFY Take 1 tablet (2 mg total) by mouth daily. For mood control Start taking on:  01/25/2018  Indication:  mood stability   gabapentin 300 MG capsule Commonly known as:  NEURONTIN Take 2 capsules (600 mg total) by mouth 3 (three) times daily. Replaces:  gabapentin 600 MG tablet  Indication:  Agitation, Fibromyalgia Syndrome, Neuropathic Pain   hydrOXYzine 50 MG tablet Commonly known as:  ATARAX/VISTARIL Take 1 tablet (50 mg total) by mouth 3 (three) times daily as needed for anxiety. What changed:    medication strength  how much to take  when to take this  reasons to take this  Indication:  Feeling Anxious   levothyroxine 25 MCG tablet Commonly known as:  SYNTHROID, LEVOTHROID TAKE 1 TABLET BY MOUTH DAILY BEFORE BREAKFAST  Indication:  Underactive Thyroid   mirtazapine 15 MG tablet Commonly known as:  REMERON Take 1 tablet (15 mg total) by mouth at bedtime. For mood control  Indication:  mood stability   oxyCODONE 5 MG immediate release tablet Commonly known as:  Oxy IR/ROXICODONE Take 2 tablets (10 mg total) by mouth every 6 (six) hours as needed for severe pain.  Indication:  Chronic Pain   topiramate 100 MG tablet Commonly known as:  TOPAMAX Take 1 tablet (100 mg total) by mouth 2 (two) times daily. What changed:    medication strength  how much to take  when to take this  Indication:  Per PCP   venlafaxine XR 75 MG 24 hr capsule Commonly known as:  EFFEXOR-XR Take 3 capsules (225 mg total)  by mouth daily with breakfast. Start taking on:  01/25/2018 Replaces:  venlafaxine 75 MG tablet  Indication:  Major Depressive Disorder   ZETIA 10 MG tablet Generic drug:  ezetimibe Take 10 mg by mouth daily.  Indication:  High Amount of Fats in the Blood      Follow-up Information    [facility] Follow up on 01/30/2018.   Why:  Intake appointment 01/30/18 at 8:30am with Nyra Jabs. Please bring photo ID and medicaid card. Contact information: [address] [place] [address] [phone]           Follow-up recommendations:  Continue activity as tolerated. Continue diet as recommended by your PCP. Ensure to keep all appointments with outpatient providers.  Comments:  Patient is instructed prior to discharge to: Take all medications as prescribed by his/her mental healthcare provider. Report any adverse effects and or reactions from the medicines to his/her outpatient provider promptly. Patient has been instructed & cautioned: To not engage in alcohol and or illegal drug use while on prescription medicines. In the event of worsening symptoms, patient is  instructed to call the crisis hotline, 911 and or go to the nearest ED for appropriate evaluation and treatment of symptoms. To follow-up with his/her primary care provider for your other medical issues, concerns and or health care needs.    Signed: Gerlene Burdockravis B Anah Billard, FNP 01/24/2018, 11:35 AM

## 2018-01-24 NOTE — [facility] Group Notes (Signed)
Adult Psychoeducational Group Note  Date:  01/24/2018 Time:  10:57 AM  Group Topic/Focus:  Video  Participation Level:  Active  Participation Quality:  Appropriate  Affect:  Appropriate  Cognitive:  Appropriate  Insight: Appropriate  Engagement in Group:  Engaged  Modes of Intervention:  Activity  Additional Comments:  Pt was alert and engaged in therapeutic television topic on vulnerability.  Dellia NimsJaquesha M Elvi Leventhal 01/24/2018, 10:57 AM

## 2018-01-24 NOTE — Progress Notes (Signed)
D:Patient observed resting in bed with eyes closed. Respirations even and non labored.  A:Q 15 minute checks remain in progress for safety R:Patient remains safe on unit.

## 2018-01-24 NOTE — Progress Notes (Signed)
CSW spoke with the patient's sister, Sande Brothersamela Short [phone]) regarding the patient discharge plans. Per Rinaldo CloudPamela she spoke with the patient last night and believes she is doing much better. Rinaldo Cloudamela reports that at discharge, the patient [place] live with her in [place], [place].   CSW informed Rinaldo Cloudamela that the patient has an [facility]. Rinaldo Cloudamela voiced understanding and stated that she will make sure the patient follows up with her outpatient appointments. CSW informed Rinaldo Cloudamela that once a decision was made regarding her sister's discharge, that CSW will contact her again to coordinate a pick up time.   CSW will continue to follow for a discharge decision.    Baldo DaubJolan Tylisha Danis, MSW, LCSWA Clinical Social Worker [facility]  Phone: [phone]

## 2018-01-24 NOTE — Progress Notes (Signed)
Pt attend wrap up group. Heather Hanson. Her day was 10. Her goal get her pain level under control.

## 2018-01-28 MED FILL — oxyCODONE HCL 5 MG TABS: 5 | 30 days supply | Qty: 90 | Fill #0

## 2018-01-30 ENCOUNTER — Emergency Department (HOSPITAL_BASED_OUTPATIENT_CLINIC_OR_DEPARTMENT_OTHER): Payer: Medicaid Other

## 2018-01-30 ENCOUNTER — Other Ambulatory Visit: Payer: Self-pay

## 2018-01-30 ENCOUNTER — Encounter (HOSPITAL_BASED_OUTPATIENT_CLINIC_OR_DEPARTMENT_OTHER): Payer: Self-pay | Admitting: Emergency Medicine

## 2018-01-30 ENCOUNTER — Inpatient Hospital Stay (HOSPITAL_BASED_OUTPATIENT_CLINIC_OR_DEPARTMENT_OTHER)
Admission: EM | Admit: 2018-01-30 | Discharge: 2018-02-19 | DRG: 270 | Disposition: A | Discharge status: Home Health | Payer: Medicaid Other | Attending: Internal Medicine | Admitting: Internal Medicine

## 2018-01-30 DIAGNOSIS — R310 Gross hematuria: Secondary | ICD-10-CM

## 2018-01-30 DIAGNOSIS — N179 Acute kidney failure, unspecified: Secondary | ICD-10-CM

## 2018-01-30 DIAGNOSIS — D62 Acute posthemorrhagic anemia: Secondary | ICD-10-CM | POA: Diagnosis not present

## 2018-01-30 DIAGNOSIS — T508X5A Adverse effect of diagnostic agents, initial encounter: Secondary | ICD-10-CM | POA: Diagnosis not present

## 2018-01-30 DIAGNOSIS — F418 Other specified anxiety disorders: Secondary | ICD-10-CM | POA: Diagnosis not present

## 2018-01-30 DIAGNOSIS — D638 Anemia in other chronic diseases classified elsewhere: Secondary | ICD-10-CM | POA: Diagnosis present

## 2018-01-30 DIAGNOSIS — N186 End stage renal disease: Secondary | ICD-10-CM

## 2018-01-30 DIAGNOSIS — G40909 Epilepsy, unspecified, not intractable, without status epilepticus: Secondary | ICD-10-CM | POA: Diagnosis present

## 2018-01-30 DIAGNOSIS — D569 Thalassemia, unspecified: Secondary | ICD-10-CM | POA: Diagnosis not present

## 2018-01-30 DIAGNOSIS — Y92230 Patient room in hospital as the place of occurrence of the external cause: Secondary | ICD-10-CM | POA: Diagnosis not present

## 2018-01-30 DIAGNOSIS — R011 Cardiac murmur, unspecified: Secondary | ICD-10-CM | POA: Diagnosis not present

## 2018-01-30 DIAGNOSIS — S7012XA Contusion of left thigh, initial encounter: Secondary | ICD-10-CM | POA: Diagnosis not present

## 2018-01-30 DIAGNOSIS — K222 Esophageal obstruction: Secondary | ICD-10-CM | POA: Diagnosis present

## 2018-01-30 DIAGNOSIS — Z880 Allergy status to penicillin: Secondary | ICD-10-CM

## 2018-01-30 DIAGNOSIS — F4321 Adjustment disorder with depressed mood: Secondary | ICD-10-CM | POA: Diagnosis present

## 2018-01-30 DIAGNOSIS — I809 Phlebitis and thrombophlebitis of unspecified site: Secondary | ICD-10-CM | POA: Diagnosis present

## 2018-01-30 DIAGNOSIS — Z808 Family history of malignant neoplasm of other organs or systems: Secondary | ICD-10-CM

## 2018-01-30 DIAGNOSIS — Z915 Personal history of self-harm: Secondary | ICD-10-CM

## 2018-01-30 DIAGNOSIS — I10 Essential (primary) hypertension: Secondary | ICD-10-CM | POA: Diagnosis not present

## 2018-01-30 DIAGNOSIS — I808 Phlebitis and thrombophlebitis of other sites: Secondary | ICD-10-CM | POA: Diagnosis present

## 2018-01-30 DIAGNOSIS — E039 Hypothyroidism, unspecified: Secondary | ICD-10-CM | POA: Diagnosis present

## 2018-01-30 DIAGNOSIS — E782 Mixed hyperlipidemia: Secondary | ICD-10-CM | POA: Diagnosis present

## 2018-01-30 DIAGNOSIS — I82612 Acute embolism and thrombosis of superficial veins of left upper extremity: Secondary | ICD-10-CM | POA: Diagnosis not present

## 2018-01-30 DIAGNOSIS — Z885 Allergy status to narcotic agent status: Secondary | ICD-10-CM

## 2018-01-30 DIAGNOSIS — I82290 Acute embolism and thrombosis of other thoracic veins: Secondary | ICD-10-CM | POA: Diagnosis present

## 2018-01-30 DIAGNOSIS — I82622 Acute embolism and thrombosis of deep veins of left upper extremity: Secondary | ICD-10-CM | POA: Diagnosis not present

## 2018-01-30 DIAGNOSIS — I82B12 Acute embolism and thrombosis of left subclavian vein: Secondary | ICD-10-CM | POA: Diagnosis present

## 2018-01-30 DIAGNOSIS — Z8042 Family history of malignant neoplasm of prostate: Secondary | ICD-10-CM

## 2018-01-30 DIAGNOSIS — Z90711 Acquired absence of uterus with remaining cervical stump: Secondary | ICD-10-CM

## 2018-01-30 DIAGNOSIS — N141 Nephropathy induced by other drugs, medicaments and biological substances: Secondary | ICD-10-CM | POA: Diagnosis not present

## 2018-01-30 DIAGNOSIS — Z886 Allergy status to analgesic agent status: Secondary | ICD-10-CM

## 2018-01-30 DIAGNOSIS — Z887 Allergy status to serum and vaccine status: Secondary | ICD-10-CM

## 2018-01-30 DIAGNOSIS — R569 Unspecified convulsions: Secondary | ICD-10-CM

## 2018-01-30 DIAGNOSIS — R609 Edema, unspecified: Secondary | ICD-10-CM

## 2018-01-30 DIAGNOSIS — E875 Hyperkalemia: Secondary | ICD-10-CM | POA: Diagnosis not present

## 2018-01-30 DIAGNOSIS — N170 Acute kidney failure with tubular necrosis: Secondary | ICD-10-CM | POA: Diagnosis not present

## 2018-01-30 DIAGNOSIS — Z6835 Body mass index (BMI) 35.0-35.9, adult: Secondary | ICD-10-CM | POA: Diagnosis not present

## 2018-01-30 DIAGNOSIS — K219 Gastro-esophageal reflux disease without esophagitis: Secondary | ICD-10-CM | POA: Diagnosis not present

## 2018-01-30 DIAGNOSIS — Z9049 Acquired absence of other specified parts of digestive tract: Secondary | ICD-10-CM

## 2018-01-30 DIAGNOSIS — Z91040 Latex allergy status: Secondary | ICD-10-CM

## 2018-01-30 DIAGNOSIS — N3289 Other specified disorders of bladder: Secondary | ICD-10-CM | POA: Diagnosis not present

## 2018-01-30 DIAGNOSIS — E872 Acidosis: Secondary | ICD-10-CM | POA: Diagnosis not present

## 2018-01-30 DIAGNOSIS — Z8711 Personal history of peptic ulcer disease: Secondary | ICD-10-CM

## 2018-01-30 DIAGNOSIS — E876 Hypokalemia: Secondary | ICD-10-CM | POA: Diagnosis not present

## 2018-01-30 DIAGNOSIS — R34 Anuria and oliguria: Secondary | ICD-10-CM | POA: Diagnosis not present

## 2018-01-30 DIAGNOSIS — I8290 Acute embolism and thrombosis of unspecified vein: Secondary | ICD-10-CM

## 2018-01-30 DIAGNOSIS — E669 Obesity, unspecified: Secondary | ICD-10-CM | POA: Diagnosis not present

## 2018-01-30 DIAGNOSIS — G43909 Migraine, unspecified, not intractable, without status migrainosus: Secondary | ICD-10-CM | POA: Diagnosis present

## 2018-01-30 DIAGNOSIS — Z79890 Hormone replacement therapy (postmenopausal): Secondary | ICD-10-CM

## 2018-01-30 DIAGNOSIS — Z803 Family history of malignant neoplasm of breast: Secondary | ICD-10-CM

## 2018-01-30 DIAGNOSIS — R1310 Dysphagia, unspecified: Secondary | ICD-10-CM

## 2018-01-30 DIAGNOSIS — Z801 Family history of malignant neoplasm of trachea, bronchus and lung: Secondary | ICD-10-CM

## 2018-01-30 DIAGNOSIS — D6959 Other secondary thrombocytopenia: Secondary | ICD-10-CM | POA: Diagnosis not present

## 2018-01-30 DIAGNOSIS — Z888 Allergy status to other drugs, medicaments and biological substances status: Secondary | ICD-10-CM

## 2018-01-30 DIAGNOSIS — Z8249 Family history of ischemic heart disease and other diseases of the circulatory system: Secondary | ICD-10-CM

## 2018-01-30 DIAGNOSIS — N6489 Other specified disorders of breast: Secondary | ICD-10-CM | POA: Diagnosis not present

## 2018-01-30 DIAGNOSIS — M797 Fibromyalgia: Secondary | ICD-10-CM | POA: Diagnosis present

## 2018-01-30 DIAGNOSIS — T45615A Adverse effect of thrombolytic drugs, initial encounter: Secondary | ICD-10-CM | POA: Diagnosis not present

## 2018-01-30 HISTORY — DX: Acute embolism and thrombosis of unspecified deep veins of unspecified lower extremity: I82.409

## 2018-01-30 LAB — CBC
HEMATOCRIT: 39 % (ref 36.0–46.0)
HEMOGLOBIN: 12.3 g/dL (ref 12.0–15.0)
MCH: 22.2 pg — ABNORMAL LOW (ref 26.0–34.0)
MCHC: 31.5 g/dL (ref 30.0–36.0)
MCV: 70.4 fL — ABNORMAL LOW (ref 78.0–100.0)
Platelets: 212 10*3/uL (ref 150–400)
RBC: 5.54 MIL/uL — ABNORMAL HIGH (ref 3.87–5.11)
RDW: 16.3 % — ABNORMAL HIGH (ref 11.5–15.5)
WBC: 6.4 10*3/uL (ref 4.0–10.5)

## 2018-01-30 LAB — BASIC METABOLIC PANEL
ANION GAP: 8 (ref 5–15)
BUN: 13 mg/dL (ref 6–20)
CHLORIDE: 109 mmol/L (ref 101–111)
CO2: 26 mmol/L (ref 22–32)
CREATININE: 1.14 mg/dL — AB (ref 0.44–1.00)
Calcium: 8.9 mg/dL (ref 8.9–10.3)
GFR calc non Af Amer: 52 mL/min — ABNORMAL LOW (ref >60)
Glucose, Bld: 105 mg/dL — ABNORMAL HIGH (ref 65–99)
POTASSIUM: 3.4 mmol/L — AB (ref 3.5–5.1)
SODIUM: 143 mmol/L (ref 135–145)

## 2018-01-30 LAB — CBC WITH DIFFERENTIAL/PLATELET
BASOS ABS: 0 10*3/uL (ref 0.0–0.1)
Basophils Relative: 0 %
Eosinophils Absolute: 0.3 10*3/uL (ref 0.0–0.7)
Eosinophils Relative: 5 %
HCT: 37.9 % (ref 36.0–46.0)
HEMOGLOBIN: 12.1 g/dL (ref 12.0–15.0)
LYMPHS PCT: 28 %
Lymphs Abs: 1.7 10*3/uL (ref 0.7–4.0)
MCH: 22.1 pg — ABNORMAL LOW (ref 26.0–34.0)
MCHC: 31.9 g/dL (ref 30.0–36.0)
MCV: 69.2 fL — ABNORMAL LOW (ref 78.0–100.0)
MONOS PCT: 8 %
Monocytes Absolute: 0.5 10*3/uL (ref 0.1–1.0)
NEUTROS PCT: 59 %
Neutro Abs: 3.5 10*3/uL (ref 1.7–7.7)
Platelets: 242 10*3/uL (ref 150–400)
RBC: 5.48 MIL/uL — AB (ref 3.87–5.11)
RDW: 17.4 % — ABNORMAL HIGH (ref 11.5–15.5)
WBC: 6 10*3/uL (ref 4.0–10.5)

## 2018-01-30 LAB — TROPONIN I

## 2018-01-30 LAB — PROTIME-INR
INR: 0.91
PROTHROMBIN TIME: 12.2 s (ref 11.4–15.2)

## 2018-01-30 LAB — BRAIN NATRIURETIC PEPTIDE: B Natriuretic Peptide: 18.7 pg/mL (ref 0.0–100.0)

## 2018-01-30 MED ORDER — GABAPENTIN 300 MG PO CAPS
600.0000 mg | ORAL_CAPSULE | Freq: Three times a day (TID) | ORAL | Status: DC
  Administered 2018-01-30 – 2018-02-01 (×4): 600 mg via ORAL
  Filled 2018-01-30 (×4): qty 2

## 2018-01-30 MED ORDER — OXYCODONE HCL 5 MG PO TABS
10.0000 mg | ORAL_TABLET | Freq: Four times a day (QID) | ORAL | Status: DC | PRN
  Administered 2018-01-31 – 2018-02-15 (×25): 10 mg via ORAL
  Filled 2018-01-30 (×23): qty 2

## 2018-01-30 MED ORDER — ONDANSETRON HCL 4 MG/2ML IJ SOLN: 4.0000 mg | Freq: Four times a day (QID) | INTRAMUSCULAR | Status: DC | PRN

## 2018-01-30 MED ORDER — OXYCODONE HCL 5 MG PO TABS
10.0000 mg | ORAL_TABLET | Freq: Once | ORAL | Status: AC
  Administered 2018-01-30: 10 mg via ORAL
  Filled 2018-01-30: qty 2

## 2018-01-30 MED ORDER — POTASSIUM CHLORIDE 20 MEQ/15ML (10%) PO SOLN
20.0000 meq | Freq: Once | ORAL | Status: AC
  Administered 2018-01-30: 20 meq via ORAL
  Filled 2018-01-30: qty 15

## 2018-01-30 MED ORDER — ARIPIPRAZOLE 2 MG PO TABS
2.0000 mg | ORAL_TABLET | Freq: Every day | ORAL | Status: DC
  Administered 2018-01-31 – 2018-02-19 (×19): 2 mg via ORAL
  Filled 2018-01-30 (×20): qty 1

## 2018-01-30 MED ORDER — IOPAMIDOL (ISOVUE-370) INJECTION 76%
100.0000 mL | Freq: Once | INTRAVENOUS | Status: AC | PRN
  Administered 2018-01-30: 100 mL via INTRAVENOUS

## 2018-01-30 MED ORDER — ZOLPIDEM TARTRATE 5 MG PO TABS
5.0000 mg | ORAL_TABLET | Freq: Every evening | ORAL | Status: DC | PRN
  Administered 2018-01-30 – 2018-02-18 (×14): 5 mg via ORAL
  Filled 2018-01-30 (×14): qty 1

## 2018-01-30 MED ORDER — MIRTAZAPINE 15 MG PO TABS
15.0000 mg | ORAL_TABLET | Freq: Every day | ORAL | Status: DC
  Administered 2018-01-30 – 2018-02-01 (×3): 15 mg via ORAL
  Filled 2018-01-30 (×3): qty 1

## 2018-01-30 MED ORDER — HYDROXYZINE HCL 25 MG PO TABS
50.0000 mg | ORAL_TABLET | Freq: Three times a day (TID) | ORAL | Status: DC | PRN
  Administered 2018-01-31: 50 mg via ORAL
  Filled 2018-01-30: qty 2

## 2018-01-30 MED ORDER — HEPARIN BOLUS VIA INFUSION
7000.0000 [IU] | Freq: Once | INTRAVENOUS | Status: AC
  Administered 2018-01-30: 7000 [IU] via INTRAVENOUS

## 2018-01-30 MED ORDER — VENLAFAXINE HCL ER 75 MG PO CP24
225.0000 mg | ORAL_CAPSULE | Freq: Every day | ORAL | Status: DC
  Administered 2018-01-31 – 2018-02-02 (×3): 225 mg via ORAL
  Filled 2018-01-30: qty 3
  Filled 2018-01-30: qty 1
  Filled 2018-01-30: qty 3

## 2018-01-30 MED ORDER — EZETIMIBE 10 MG PO TABS
10.0000 mg | ORAL_TABLET | Freq: Every day | ORAL | Status: DC
  Administered 2018-01-31 – 2018-02-19 (×20): 10 mg via ORAL
  Filled 2018-01-30 (×20): qty 1

## 2018-01-30 MED ORDER — LORAZEPAM 2 MG/ML IJ SOLN: 1.0000 mg | INTRAMUSCULAR | Status: DC | PRN

## 2018-01-30 MED ORDER — HEPARIN (PORCINE) IN NACL 100-0.45 UNIT/ML-% IJ SOLN
1550.0000 [IU]/h | INTRAMUSCULAR | Status: DC
  Administered 2018-01-30 – 2018-01-31 (×2): 1550 [IU]/h via INTRAVENOUS
  Filled 2018-01-30 (×2): qty 250

## 2018-01-30 MED ORDER — HYDRALAZINE HCL 20 MG/ML IJ SOLN: 5.0000 mg | INTRAMUSCULAR | Status: DC | PRN

## 2018-01-30 MED ORDER — CLINDAMYCIN PHOSPHATE 900 MG/50ML IV SOLN
900.0000 mg | Freq: Once | INTRAVENOUS | Status: AC
  Administered 2018-01-30: 900 mg via INTRAVENOUS
  Filled 2018-01-30: qty 50

## 2018-01-30 MED ORDER — LEVOTHYROXINE SODIUM 25 MCG PO TABS
25.0000 ug | ORAL_TABLET | Freq: Every day | ORAL | Status: DC
  Administered 2018-01-31 – 2018-02-19 (×20): 25 ug via ORAL
  Filled 2018-01-30 (×20): qty 1

## 2018-01-30 MED ORDER — ONDANSETRON HCL 4 MG PO TABS
4.0000 mg | ORAL_TABLET | Freq: Four times a day (QID) | ORAL | Status: DC | PRN
  Administered 2018-02-16: 4 mg via ORAL
  Filled 2018-01-30: qty 1

## 2018-01-30 MED ORDER — TOPIRAMATE 25 MG PO TABS
100.0000 mg | ORAL_TABLET | Freq: Two times a day (BID) | ORAL | Status: DC
  Administered 2018-01-30 – 2018-02-19 (×39): 100 mg via ORAL
  Filled 2018-01-30 (×19): qty 4
  Filled 2018-01-30: qty 1
  Filled 2018-01-30 (×2): qty 4
  Filled 2018-01-30: qty 1
  Filled 2018-01-30 (×16): qty 4

## 2018-01-30 NOTE — Progress Notes (Signed)
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  [facility] for [facility] ideation.  Patient d/w Dr. Myra GianottiBrabham - [facility] for [facility] heparin and assessment by vascular surgery.  She does not have current signs of vascular compromise, likely high risk for future complications.  Given Clindamycin x 1, likely does not need to continue.  Will place in observation status in Med Surg.    Georgana CurioJennifer E. Sonia Stickels, M.D.

## 2018-01-30 NOTE — H&P (Signed)
History and Physical    Heather Hanson XBM:[id number] DOB: 23-Feb-1961 DOA: 01/30/2018  Referring MD/NP/PA:   PCP: Jearld Lesch, MD   Patient coming from:  The patient is coming from home.  At baseline, pt is independent for most of ADL.       Chief Complaint: left upper arm pain  HPI: Heather Hanson is a 57 y.o. female with medical history significant of hyperlipidemia, GERD, hypothyroidism, depression with anxiety, suicide attempt on 2/9, PUD, obesity, migraine headaches, fibromyalgia, who presents with left upper arm pain.  Pt states that she has been having left upper arm pain and swelling in the past 4 days, which has been progressively getting worse.The pain began as a dull aching sensation in her upper arm and has now progressed to her forearm. Her left upper arm is also mildly erythematous. Patient states that the pain is constant, sharp, 9 out of 10 in severity. Denies any recent trauma to the area. She states that she did have an IV in her arm 1-1/2-2 weeks ago.  No fever or chills. Patient states that she has occasional mild chest pain and shortness of breath recently, but currently no chest pain or shortness of breath. No calf tenderness. Patient does not have nausea, vomiting, diarrhea, abdominal pain, symptoms of UTI or unilateral weakness. Patient denies suicidal or homicidal ideations.  ED Course: pt was found to have WBC 6.0, INR 0.91, negative troponin, potassium 3.4, creatinine 1.14, BUN 13, no fever, no tachycardia, no tachypnea, oxygen saturation 99% on room air. Patient is placed on MedSurg bed for observation. VVS, Dr. Myra Gianotti was consulted by EDP.  LUE venous doppler showed: 1. deep venous thrombosis involving the paired brachial veins in the subclavian vein with thrombus extending into the innominate vein. The brachial venous DVT is occlusive while the subclavian and innominate DVT is nonocclusive. 2. Positive for superficial venous thrombosis involving the  left basilic vein.  Review of Systems:   General: no fevers, chills, no body weight gain, has fatigue HEENT: no blurry vision, hearing changes or sore throat Respiratory: no dyspnea, coughing, wheezing CV: no chest pain, no palpitations GI: no nausea, vomiting, abdominal pain, diarrhea, constipation GU: no dysuria, burning on urination, increased urinary frequency, hematuria  Ext: no leg edema. Has left upper arm pain and swelling. Neuro: no unilateral weakness, numbness, or tingling, no vision change or hearing loss Skin: no rash, no skin tear. MSK: No muscle spasm, no deformity, no limitation of range of movement in spin Heme: No easy bruising.  Travel history: No recent long distant travel.  Allergy:  Allergies  Allergen Reactions  . Aspirin Other (See Comments)    Per Dr  . Augmentin [Amoxicillin-Pot Clavulanate] Other (See Comments)    Blisters in the back of throat  . Dexilant [Dexlansoprazole] Other (See Comments)    Bowel urgency and liquid stools  . Hydrocodone-Acetaminophen Hives    REACTION: Rash  . Influenza Vaccines Swelling    Arm swelled to the size of a grapefruit  . Levsin [Hyoscyamine Sulfate] Other (See Comments)    Projectile vomitting  . Linzess [Linaclotide] Other (See Comments)    Bowel urgency and liquid stools  . Promethazine Nausea And Vomiting  . Propoxyphene N-Acetaminophen Itching    REACTION: rash  . Statins Other (See Comments)    myalgia  . Acetaminophen Itching and Rash  . Latex Itching and Rash    Rash-looks like she has the measles per pt  . Oxycodone-Acetaminophen Itching and Rash  REACTION: Rash    Past Medical History:  Diagnosis Date  . Adjustment disorder with mixed anxiety and depressed mood 09/28/2009   Qualifier: Diagnosis of  By: Nena JordanYoo DO, D. Robert   . Allergic state 04/09/2013  . Anemia    thalassemia  . Anxiety    takes Ativan daily prn  . Arthritis    sees Dr.Beekman  . Bursitis of right shoulder   .  Cholecystitis, acute 08/30/2013  . Chronic insomnia   . Complication of anesthesia    hard to sedate  . Constipation    takes Linzess daily prn  . Depression    takes Effexor daily  . Difficulty swallowing solids   . Fibromyalgia   . GERD (gastroesophageal reflux disease) 06/17/2013   takes Dexilant daily prn  . H. pylori infection 07/19/2013  . H/O hiatal hernia   . Headache(784.0) 05/22/2013  . Hereditary and idiopathic peripheral neuropathy 03/14/2015  . History of bronchitis    last time many yrs ago  . History of colonoscopy 2000  . Hyperlipidemia    not on any meds   . Hypoglycemia   . Hypothyroidism   . Lichen    [facility]  . Migraines    takes Topamax daily-last migraine today  . Muscle spasms of head and/or neck    takes Norflex daily prn  . Nausea    takes Phenergan prn  . Obesity   . Peripheral edema    takes Lasix prn  . PONV (postoperative nausea and vomiting)    with tonsil removal surgery  . PUD (peptic ulcer disease)    Dr Loreta AveMann ( H. Pylori)  . Seizure disorder (HCC) x 2   presumed secondary to ativan withdrawal -Dr Sharene SkeansHickling  . Seizures (HCC)    X 2  . Shortness of breath    pt states when Fibromyalgia flares up  . Sinusitis, acute 04/09/2013   takes Zyrtec daily    Past Surgical History:  Procedure Laterality Date  . ABDOMINAL HYSTERECTOMY  2000   partial  . CHOLECYSTECTOMY N/A 09/16/2013   Procedure: LAPAROSCOPIC CHOLECYSTECTOMY;  Surgeon: Emelia LoronMatthew Wakefield, MD;  Location: [facility] OR;  Service: General;  Laterality: N/A;  . COLONOSCOPY    . REMOVAL OF CERVICAL EXOSTOSIS N/A 06/08/2016   Procedure: REMOVAL OF  EXOSTOSIS ANTERIOR CERVICAL SPINE;  Surgeon: Venita Lickahari Brooks, MD;  Location: [facility] OR;  Service: Orthopedics;  Laterality: N/A;  . TONSILLECTOMY  1972    Social History:  reports that  has never smoked. she has never used smokeless tobacco. She reports that she does not drink alcohol or use drugs.  Family History:  Family History  Problem  Relation Age of Onset  . Diabetes Mother        typer 2  . Heart disease Mother        PVD, PAD  . Cancer Father        prostate, lung, brain mets, smoker  . COPD Father   . Hyperlipidemia Father   . Hypertension Father   . Thyroid disease Father   . Hypertension Sister   . Thyroid disease Sister   . Arthritis Sister   . Diabetes Son 16       type 1  . Diabetes Maternal Grandmother   . Stroke Maternal Grandfather   . Cancer Paternal Grandmother [id number]       breast  . Alcohol abuse Paternal Grandfather   . Hypertension Sister   . Obesity Sister   . Psoriasis Son   .  Cancer Unknown        Breast < 27 yo, ,Prostate <50 yo  . Coronary artery disease Unknown        <60 yo  . Diabetes Unknown      Prior to Admission medications   Medication Sig Start Date End Date Taking? Authorizing Provider  ARIPiprazole (ABILIFY) 2 MG tablet Take 1 tablet (2 mg total) by mouth daily. For mood control 01/25/18   Money, Gerlene Burdock, FNP  gabapentin (NEURONTIN) 300 MG capsule Take 2 capsules (600 mg total) by mouth 3 (three) times daily. 01/24/18   Money, Gerlene Burdock, FNP  hydrOXYzine (ATARAX/VISTARIL) 50 MG tablet Take 1 tablet (50 mg total) by mouth 3 (three) times daily as needed for anxiety. 01/24/18   Money, Gerlene Burdock, FNP  levothyroxine (SYNTHROID, LEVOTHROID) 25 MCG tablet TAKE 1 TABLET BY MOUTH DAILY BEFORE BREAKFAST 03/30/16   Bradd Canary, MD  mirtazapine (REMERON) 15 MG tablet Take 1 tablet (15 mg total) by mouth at bedtime. For mood control 01/24/18   Money, Feliz Beam B, FNP  oxyCODONE (OXY IR/ROXICODONE) 5 MG immediate release tablet Take 2 tablets (10 mg total) by mouth every 6 (six) hours as needed for severe pain. 06/08/16   Venita Lick, MD  topiramate (TOPAMAX) 100 MG tablet Take 1 tablet (100 mg total) by mouth 2 (two) times daily. 01/24/18   Money, Gerlene Burdock, FNP  venlafaxine XR (EFFEXOR-XR) 75 MG 24 hr capsule Take 3 capsules (225 mg total) by mouth daily with breakfast. 01/25/18   Money, Gerlene Burdock, FNP  ZETIA 10 MG tablet Take 10 mg by mouth daily. 10/18/17   [provider]    Physical Exam: Vitals:   01/30/18 1300 01/30/18 1503 01/30/18 1729 01/30/18 2014  BP: (!) 184/102 (!) 157/102 139/90 (!) 149/93  Pulse: 70 75 77 72  Resp: 16 18 18 14   Temp: 98.4 F (36.9 C)  97.9 F (36.6 C) (!) 96.4 F (35.8 C)  TempSrc: Oral  Oral Oral  SpO2: 100% 99% 99% 99%  Weight:      Height:       General: Not in acute distress HEENT:       Eyes: PERRL, EOMI, no scleral icterus.       ENT: No discharge from the ears and nose, no pharynx injection, no tonsillar enlargement.        Neck: No JVD, no bruit, no mass felt. Heme: No neck lymph node enlargement. Cardiac: S1/S2, RRR, No murmurs, No gallops or rubs. Respiratory:  No rales, wheezing, rhonchi or rubs. GI: Soft, nondistended, nontender, no rebound pain, no organomegaly, BS present. GU: No hematuria Ext: No pitting leg edema bilaterally. 2+DP/PT pulse bilaterally. Left upper arm: has swelling, tenderness and erythema at the [facility] fossa extending onto the volar forearm, with palpable cord. 2+ radial pulse. Musculoskeletal: No joint deformities, No joint redness or warmth, no limitation of ROM in spin. Skin: No rashes.  Neuro: Alert, oriented X3, cranial nerves II-XII grossly intact, moves all extremities normally. Psych: Patient is not psychotic, no suicidal or hemocidal ideation.  Labs on Admission: I have personally reviewed following labs and imaging studies  CBC: Recent Labs  Lab 01/30/18 1248  WBC 6.0  NEUTROABS 3.5  HGB 12.1  HCT 37.9  MCV 69.2*  PLT 242   Basic Metabolic Panel: Recent Labs  Lab 01/30/18 1248  NA 143  K 3.4*  CL 109  CO2 26  GLUCOSE 105*  BUN 13  CREATININE 1.14*  CALCIUM  8.9   GFR: Estimated Creatinine Clearance: 68.9 mL/min (A) (by C-G formula based on SCr of 1.14 mg/dL (H)). Liver Function Tests: No results for input(s): AST, ALT, ALKPHOS, BILITOT, PROT, ALBUMIN in the last 168  hours. No results for input(s): LIPASE, AMYLASE in the last 168 hours. No results for input(s): AMMONIA in the last 168 hours. Coagulation Profile: Recent Labs  Lab 01/30/18 1248  INR 0.91   Cardiac Enzymes: Recent Labs  Lab 01/30/18 1248  TROPONINI <0.03   BNP (last 3 results) No results for input(s): PROBNP in the last 8760 hours. HbA1C: No results for input(s): HGBA1C in the last 72 hours. CBG: No results for input(s): GLUCAP in the last 168 hours. Lipid Profile: No results for input(s): CHOL, HDL, LDLCALC, TRIG, CHOLHDL, LDLDIRECT in the last 72 hours. Thyroid Function Tests: No results for input(s): TSH, T4TOTAL, FREET4, T3FREE, THYROIDAB in the last 72 hours. Anemia Panel: No results for input(s): VITAMINB12, FOLATE, FERRITIN, TIBC, IRON, RETICCTPCT in the last 72 hours. Urine analysis:    Component Value Date/Time   COLORURINE AMBER (A) 01/21/2018 0847   APPEARANCEUR CLOUDY (A) 01/21/2018 0847   LABSPEC 1.021 01/21/2018 0847   LABSPEC 1.020 06/05/2006 0845   PHURINE 5.0 01/21/2018 0847   GLUCOSEU NEGATIVE 01/21/2018 0847   HGBUR SMALL (A) 01/21/2018 0847   BILIRUBINUR NEGATIVE 01/21/2018 0847   BILIRUBINUR Negative 06/05/2006 0845   KETONESUR NEGATIVE 01/21/2018 0847   PROTEINUR 30 (A) 01/21/2018 0847   UROBILINOGEN 0.2 09/30/2013 2344   NITRITE NEGATIVE 01/21/2018 0847   LEUKOCYTESUR LARGE (A) 01/21/2018 0847   LEUKOCYTESUR Negative 06/05/2006 0845   Sepsis Labs: @LABRCNTIP (procalcitonin:4,lacticidven:4) )No results found for this or any previous visit (from the past 240 hour(s)).   Radiological Exams on Admission: Ct Angio Chest Pe W And/or Wo Contrast  Result Date: 01/30/2018 CLINICAL DATA:  Suspect pulmonary embolus. History of blood clot and left arm. EXAM: CT ANGIOGRAPHY CHEST WITH CONTRAST TECHNIQUE: Multidetector CT imaging of the chest was performed using the standard protocol during bolus administration of intravenous contrast. Multiplanar CT  image reconstructions and MIPs were obtained to evaluate the vascular anatomy. CONTRAST:  ISOVUE-370 IOPAMIDOL (ISOVUE-370) INJECTION 76% COMPARISON:  None. FINDINGS: Cardiovascular: The heart size appears within normal limits. No pericardial effusion visualized. Aortic atherosclerosis identified. The main pulmonary artery is patent. No saddle embolus or central obstructing emboli identified. No lobar or segmental pulmonary emboli identified. Mediastinum/Nodes: Normal appearance of the thyroid gland. The trachea appears patent and is midline. Normal appearance of the esophagus. No enlarged mediastinal or hilar lymph nodes. Lungs/Pleura: No pleural effusion. No pneumothorax, airspace consolidation or atelectasis. Pulmonary nodule in the right upper lobe measures 3 mm, image 41 of series 5. Upper Abdomen: Cysts noted within left lobe of liver. No acute abnormality identified within the upper abdomen. Musculoskeletal: There is degenerative disc disease identified within the thoracic spine. No aggressive lytic or sclerotic bone lesions identified. Review of the MIP images confirms the above findings. IMPRESSION: 1. No evidence for acute pulmonary embolus. No active cardiopulmonary abnormalities noted. 2.  Aortic Atherosclerosis (ICD10-I70.0). 3. Right upper lobe pulmonary nodule measures 3 mm. No follow-up needed if patient is low-risk. Non-contrast chest CT can be considered in 12 months if patient is high-risk. This recommendation follows the consensus statement: Guidelines for Management of Incidental Pulmonary Nodules Detected on CT Images: From the Fleischner Society 2017; Radiology 2017; 284:228-243. Electronically Signed   By: Signa Kell M.D.   On: 01/30/2018 14:01   US Venous Img  Upper Uni Left  Result Date: 01/30/2018 CLINICAL DATA:  57 year old female with left upper extremity pain and swelling EXAM: LEFT UPPER EXTREMITY VENOUS DOPPLER ULTRASOUND TECHNIQUE: Gray-scale sonography with graded  compression, as well as color Doppler and duplex ultrasound were performed to evaluate the upper extremity deep venous system from the level of the subclavian vein and including the jugular, axillary, basilic, radial, ulnar and upper cephalic vein. Spectral Doppler was utilized to evaluate flow at rest and with distal augmentation maneuvers. COMPARISON:  None. FINDINGS: Contralateral Subclavian Vein: Respiratory phasicity is normal and symmetric with the symptomatic side. No evidence of thrombus. Normal compressibility. Internal Jugular Vein: No evidence of thrombus. Normal compressibility, respiratory phasicity and response to augmentation. Subclavian Vein: Echogenic material present within the left innominate vein extending into the left subclavian vein. There is evidence of some flow on color Doppler imaging suggesting that this thrombus is nonocclusive. Axillary Vein: No evidence of thrombus. Normal compressibility, respiratory phasicity and response to augmentation. Cephalic Vein: No evidence of thrombus. Normal compressibility, respiratory phasicity and response to augmentation. Basilic Vein: The basilic vein is not compressible. The lumen is filled with low-level internal echoes. No evidence of flow on color Doppler imaging. Brachial Veins: Noncompressible paired brachial veins. No evidence of flow on color Doppler imaging. Radial Veins: No evidence of thrombus. Normal compressibility, respiratory phasicity and response to augmentation. Ulnar Veins: No evidence of thrombus. Normal compressibility, respiratory phasicity and response to augmentation. Venous Reflux:  None visualized. Other Findings:  None visualized. IMPRESSION: 1. Positive for deep venous thrombosis involving the paired brachial veins in the subclavian vein with thrombus extending into the innominate vein. The brachial venous DVT is occlusive while the subclavian and innominate DVT is nonocclusive. 2. Positive for superficial venous thrombosis  involving the left basilic vein. Signed, Sterling Big, MD [facility] Electronically Signed   By: Malachy Moan M.D.   On: 01/30/2018 12:58     EKG:   Not done in ED, will get one.   Assessment/Plan Principal Problem:   Acute deep vein thrombosis (DVT) of left upper extremity (HCC) Active Problems:   Hyperlipidemia, mixed   Obesity   Depression with anxiety   Seizure (HCC)   GERD (gastroesophageal reflux disease)   Hypothyroidism   Hypokalemia   Acute deep vein thrombosis (DVT) of left upper extremity (HCC): LUE venous doppler showed deep venous thrombosis involving the paired brachial veins in the subclavian vein with thrombus extending into the innominate vein. The brachial venous DVT is occlusive while the subclavian and innominate DVT is nonocclusive; also showed superficial venous thrombosis involving the left basilic vein.  She does not have current signs of vascular compromise. Pt was given one dose of clindamycin, but the patient dose not  Have fevers or leukocytosis to suggest separative thrombophlebitis, will not continue now. VVS, Dr.Brabham was consulted by EDP-->RN will inform Dr. Myra Gianotti of pt's arrival to floor.  -will place on med-surg bed for obs. -start IV heparin in ED -prn oxycodone for pain  Seizure: Last seizure was more than a year ago  -Seizure precaution -When necessary Ativan for seizure -Continue Home medications: Topamax  HLD: -zocor  Depression and anxiety: Stable, no suicidal or homicidal ideations. -Continue home medications  GERD: -Protonix  Hypokalemia: K= 3.4 on admission. - Repleted  Hypothyroidism: Last TSH was 0.775 on 01/23/18 -Continue home Synthroid  DVT ppx: ON IV Heparin      Code Status: Full code Family Communication: None at bed  side.       Disposition Plan:  Anticipate discharge back to previous home environment Consults called:  VVS, Dr.  Myra Gianotti Admission status:   medical floor/obs     Date of Service 01/30/2018    Lorretta Harp [facility] Pager [phone]  If 7PM-7AM, please contact night-coverage [email or web address] Password [facility] 01/30/2018, 9:18 PM

## 2018-01-30 NOTE — ED Notes (Signed)
ED Provider at bedside. 

## 2018-01-30 NOTE — ED Provider Notes (Signed)
[facility] EMERGENCY DEPARTMENT Provider Note   CSN: [id number] Arrival date & time: 01/30/18  1040     History   Chief Complaint Chief Complaint  Patient presents with  . Arm Pain    HPI Heather Hanson is a 57 y.o. female.  HPI    57 year old female with past medical history as below including recent suicide attempt on 2/9 here with left arm swelling.  The patient states that over the last several days, she is developed progressively worsening arm pain, swelling, and redness.  The pain began as a dull aching sensation in her upper arm and has now progressed to her forearm.  She said associated redness and induration of her medial arm.  No fevers or chills.  Denies any chest pain but has had some occasional shortness of breath.  No history of DVT.  Denies any recent trauma to the area.  She did have an IV in her arm 1-1/2-2 weeks ago.  Did not have pain at that time.  No abdominal pain.  Pain is worse with movement and palpation.  Denies any numbness or weakness.   Past Medical History:  Diagnosis Date  . Adjustment disorder with mixed anxiety and depressed mood 09/28/2009   Qualifier: Diagnosis of  By: Nena Jordan   . Allergic state 04/09/2013  . Anemia    thalassemia  . Anxiety    takes Ativan daily prn  . Arthritis    sees Dr.Beekman  . Bursitis of right shoulder   . Cholecystitis, acute 08/30/2013  . Chronic insomnia   . Complication of anesthesia    hard to sedate  . Constipation    takes Linzess daily prn  . Depression    takes Effexor daily  . Difficulty swallowing solids   . Fibromyalgia   . GERD (gastroesophageal reflux disease) 06/17/2013   takes Dexilant daily prn  . H. pylori infection 07/19/2013  . H/O hiatal hernia   . Headache(784.0) 05/22/2013  . Hereditary and idiopathic peripheral neuropathy 03/14/2015  . History of bronchitis    last time many yrs ago  . History of colonoscopy 2000  . Hyperlipidemia    not on any meds   .  Hypoglycemia   . Hypothyroidism   . Lichen    Dr[facility]Fleisher at [facility]  . Migraines    takes Topamax daily-last migraine today  . Muscle spasms of head and/or neck    takes Norflex daily prn  . Nausea    takes Phenergan prn  . Obesity   . Peripheral edema    takes Lasix prn  . PONV (postoperative nausea and vomiting)    with tonsil removal surgery  . PUD (peptic ulcer disease)    Dr Loreta Ave ( H. Pylori)  . Seizure disorder (HCC) x 2   presumed secondary to ativan withdrawal -Dr Sharene Skeans  . Seizures (HCC)    X 2  . Shortness of breath    pt states when Fibromyalgia flares up  . Sinusitis, acute 04/09/2013   takes Zyrtec daily    Patient Active Problem List   Diagnosis Date Noted  . MDD (major depressive disorder), recurrent severe, without psychosis (HCC) 01/21/2018  . Major depressive disorder, recurrent episode, severe (HCC) 01/20/2018  . Hereditary and idiopathic peripheral neuropathy 03/14/2015  . Hypothyroidism 03/11/2014  . Cholecystitis, acute 08/30/2013  . H. pylori infection 07/19/2013  . GERD (gastroesophageal reflux disease) 06/17/2013  . Headache(784.0) 05/22/2013  . Sinusitis, acute 04/09/2013  . Allergic state 04/09/2013  .  Back pain 01/27/2012  . Annual physical exam 09/30/2011  . Microcytosis 09/30/2011  . Lichen planus 09/30/2011  . Polyarthralgia 09/30/2011  . DERMATITIS 12/15/2010  . Constipation 11/23/2009  . FIBROMYALGIA 11/09/2009  . NAUSEA 11/09/2009  . Hyperlipidemia, mixed 09/28/2009  . Obesity 09/28/2009  . Anemia 09/28/2009  . Depression with anxiety 09/28/2009  . SEIZURE DISORDER 09/28/2009    Past Surgical History:  Procedure Laterality Date  . ABDOMINAL HYSTERECTOMY  2000   partial  . CHOLECYSTECTOMY N/A 09/16/2013   Procedure: LAPAROSCOPIC CHOLECYSTECTOMY;  Surgeon: Emelia Loron, MD;  Location: [facility] OR;  Service: General;  Laterality: N/A;  . COLONOSCOPY    . REMOVAL OF CERVICAL EXOSTOSIS N/A 06/08/2016   Procedure: REMOVAL OF   EXOSTOSIS ANTERIOR CERVICAL SPINE;  Surgeon: Venita Lick, MD;  Location: [facility] OR;  Service: Orthopedics;  Laterality: N/A;  . TONSILLECTOMY  1972    OB History    No data available       Home Medications    Prior to Admission medications   Medication Sig Start Date End Date Taking? Authorizing Provider  ARIPiprazole (ABILIFY) 2 MG tablet Take 1 tablet (2 mg total) by mouth daily. For mood control 01/25/18   Money, Gerlene Burdock, FNP  gabapentin (NEURONTIN) 300 MG capsule Take 2 capsules (600 mg total) by mouth 3 (three) times daily. 01/24/18   Money, Gerlene Burdock, FNP  hydrOXYzine (ATARAX/VISTARIL) 50 MG tablet Take 1 tablet (50 mg total) by mouth 3 (three) times daily as needed for anxiety. 01/24/18   Money, Gerlene Burdock, FNP  levothyroxine (SYNTHROID, LEVOTHROID) 25 MCG tablet TAKE 1 TABLET BY MOUTH DAILY BEFORE BREAKFAST 03/30/16   Bradd Canary, MD  mirtazapine (REMERON) 15 MG tablet Take 1 tablet (15 mg total) by mouth at bedtime. For mood control 01/24/18   Money, Feliz Beam B, FNP  oxyCODONE (OXY IR/ROXICODONE) 5 MG immediate release tablet Take 2 tablets (10 mg total) by mouth every 6 (six) hours as needed for severe pain. 06/08/16   Venita Lick, MD  topiramate (TOPAMAX) 100 MG tablet Take 1 tablet (100 mg total) by mouth 2 (two) times daily. 01/24/18   Money, Gerlene Burdock, FNP  venlafaxine XR (EFFEXOR-XR) 75 MG 24 hr capsule Take 3 capsules (225 mg total) by mouth daily with breakfast. 01/25/18   Money, Gerlene Burdock, FNP  ZETIA 10 MG tablet Take 10 mg by mouth daily. 10/18/17   [provider]    Family History Family History  Problem Relation Age of Onset  . Diabetes Mother        typer 2  . Heart disease Mother        PVD, PAD  . Cancer Father        prostate, lung, brain mets, smoker  . COPD Father   . Hyperlipidemia Father   . Hypertension Father   . Thyroid disease Father   . Hypertension Sister   . Thyroid disease Sister   . Arthritis Sister   . Diabetes Son 16       type 1  .  Diabetes Maternal Grandmother   . Stroke Maternal Grandfather   . Cancer Paternal Grandmother 16       breast  . Alcohol abuse Paternal Grandfather   . Hypertension Sister   . Obesity Sister   . Psoriasis Son   . Cancer Unknown        Breast < 60 yo, ,Prostate <50 yo  . Coronary artery disease Unknown        <  59 yo  . Diabetes Unknown     Social History Social History   Tobacco Use  . Smoking status: Never Smoker  . Smokeless tobacco: Never Used  Substance Use Topics  . Alcohol use: No    Frequency: Never  . Drug use: No    Comment: Rare     Allergies   Aspirin; Augmentin [amoxicillin-pot clavulanate]; Dexilant [dexlansoprazole]; Hydrocodone-acetaminophen; Influenza vaccines; Levsin [hyoscyamine sulfate]; Linzess [linaclotide]; Promethazine; Propoxyphene n-acetaminophen; Statins; Acetaminophen; Latex; and Oxycodone-acetaminophen   Review of Systems Review of Systems  Constitutional: Negative for chills and fever.  HENT: Negative for congestion, rhinorrhea and sore throat.   Eyes: Negative for visual disturbance.  Respiratory: Positive for shortness of breath. Negative for cough and wheezing.   Cardiovascular: Negative for chest pain and leg swelling.  Gastrointestinal: Negative for abdominal pain, diarrhea, nausea and vomiting.  Genitourinary: Negative for dysuria, flank pain, vaginal bleeding and vaginal discharge.  Musculoskeletal: Positive for arthralgias. Negative for neck pain.  Skin: Negative for rash.  Allergic/Immunologic: Negative for immunocompromised state.  Neurological: Positive for weakness. Negative for syncope and headaches.  Hematological: Does not bruise/bleed easily.  All other systems reviewed and are negative.    Physical Exam Updated Vital Signs BP (!) 157/102 (BP Location: Right Wrist)   Pulse 75   Temp 98.4 F (36.9 C) (Oral)   Resp 18   Ht 5\' 8"  (1.727 m)   Wt 107 kg (236 lb)   SpO2 99%   BMI 35.88 kg/m   Physical Exam    Constitutional: She is oriented to person, place, and time. She appears well-developed and well-nourished. No distress.  HENT:  Head: Normocephalic and atraumatic.  Eyes: Conjunctivae are normal.  Neck: Neck supple.  Cardiovascular: Normal rate, regular rhythm and normal heart sounds. Exam reveals no friction rub.  No murmur heard. Pulmonary/Chest: Effort normal and breath sounds normal. No respiratory distress. She has no wheezes. She has no rales.  Abdominal: She exhibits no distension.  Musculoskeletal: She exhibits no edema.  Neurological: She is alert and oriented to person, place, and time. She exhibits normal muscle tone.  Skin: Skin is warm. Capillary refill takes less than 2 seconds.  Psychiatric: She has a normal mood and affect.  Nursing note and vitals reviewed.   UPPER EXTREMITY EXAM: LEFT  INSPECTION & PALPATION: Streaking erythema along left basilic vein at the [facility] foss[facility] extending onto the volar forearm with palpable cord.  No induration or fluctuance.  No open wounds.  SENSORY: Sensation is intact to light touch in:  Superficial radial nerve distribution (dorsal first web space) Median nerve distribution (tip of index finger)   Ulnar nerve distribution (tip of small finger)     MOTOR:  + Motor posterior interosseous nerve (thumb IP extension) + Anterior interosseous nerve (thumb IP flexion, index finger DIP flexion) + Radial nerve (wrist extension) + Median nerve (palpable firing thenar mass) + Ulnar nerve (palpable firing of first dorsal interosseous muscle)  VASCULAR: 2+ radial pulse Brisk capillary refill < 2 sec, fingers warm and well-perfused  COMPARTMENTS: Soft, warm, well-perfused No pain with passive extension No paresthesias    ED Treatments / Results  Labs (all labs ordered are listed, but only abnormal results are displayed) Labs Reviewed  CBC WITH DIFFERENTIAL/PLATELET - Abnormal; Notable for the following components:      Result Value    RBC 5.48 (*)    MCV 69.2 (*)    MCH 22.1 (*)    RDW 17.4 (*)  All other components within normal limits  BASIC METABOLIC PANEL - Abnormal; Notable for the following components:   Potassium 3.4 (*)    Glucose, Bld 105 (*)    Creatinine, Ser 1.14 (*)    GFR calc non Af Amer 52 (*)    All other components within normal limits  CULTURE, BLOOD (ROUTINE X 2)  CULTURE, BLOOD (ROUTINE X 2)  PROTIME-INR  TROPONIN I  BRAIN NATRIURETIC PEPTIDE    EKG  EKG Interpretation None       Radiology Ct Angio Chest Pe W And/or Wo Contrast  Result Date: 01/30/2018 CLINICAL DATA:  Suspect pulmonary embolus. History of blood clot and left arm. EXAM: CT ANGIOGRAPHY CHEST WITH CONTRAST TECHNIQUE: Multidetector CT imaging of the chest was performed using the standard protocol during bolus administration of intravenous contrast. Multiplanar CT image reconstructions and MIPs were obtained to evaluate the vascular anatomy. CONTRAST:  ISOVUE-370 IOPAMIDOL (ISOVUE-370) INJECTION 76% COMPARISON:  None. FINDINGS: Cardiovascular: The heart size appears within normal limits. No pericardial effusion visualized. Aortic atherosclerosis identified. The main pulmonary artery is patent. No saddle embolus or central obstructing emboli identified. No lobar or segmental pulmonary emboli identified. Mediastinum/Nodes: Normal appearance of the thyroid gland. The trachea appears patent and is midline. Normal appearance of the esophagus. No enlarged mediastinal or hilar lymph nodes. Lungs/Pleura: No pleural effusion. No pneumothorax, airspace consolidation or atelectasis. Pulmonary nodule in the right upper lobe measures 3 mm, image 41 of series 5. Upper Abdomen: Cysts noted within left lobe of liver. No acute abnormality identified within the upper abdomen. Musculoskeletal: There is degenerative disc disease identified within the thoracic spine. No aggressive lytic or sclerotic bone lesions identified. Review of the MIP images  confirms the above findings. IMPRESSION: 1. No evidence for acute pulmonary embolus. No active cardiopulmonary abnormalities noted. 2.  Aortic Atherosclerosis (ICD10-I70.0). 3. Right upper lobe pulmonary nodule measures 3 mm. No follow-up needed if patient is low-risk. Non-contrast chest CT can be considered in 12 months if patient is high-risk. This recommendation follows the consensus statement: Guidelines for Management of Incidental Pulmonary Nodules Detected on CT Images: From the Fleischner Society 2017; Radiology 2017; 284:228-243. Electronically Signed   By: Signa Kell M.D.   On: 01/30/2018 14:01   US Venous Img Upper Uni Left  Result Date: 01/30/2018 CLINICAL DATA:  57 year old female with left upper extremity pain and swelling EXAM: LEFT UPPER EXTREMITY VENOUS DOPPLER ULTRASOUND TECHNIQUE: Gray-scale sonography with graded compression, as well as color Doppler and duplex ultrasound were performed to evaluate the upper extremity deep venous system from the level of the subclavian vein and including the jugular, axillary, basilic, radial, ulnar and upper cephalic vein. Spectral Doppler was utilized to evaluate flow at rest and with distal augmentation maneuvers. COMPARISON:  None. FINDINGS: Contralateral Subclavian Vein: Respiratory phasicity is normal and symmetric with the symptomatic side. No evidence of thrombus. Normal compressibility. Internal Jugular Vein: No evidence of thrombus. Normal compressibility, respiratory phasicity and response to augmentation. Subclavian Vein: Echogenic material present within the left innominate vein extending into the left subclavian vein. There is evidence of some flow on color Doppler imaging suggesting that this thrombus is nonocclusive. Axillary Vein: No evidence of thrombus. Normal compressibility, respiratory phasicity and response to augmentation. Cephalic Vein: No evidence of thrombus. Normal compressibility, respiratory phasicity and response to  augmentation. Basilic Vein: The basilic vein is not compressible. The lumen is filled with low-level internal echoes. No evidence of flow on color Doppler imaging. Brachial Veins: Noncompressible paired  brachial veins. No evidence of flow on color Doppler imaging. Radial Veins: No evidence of thrombus. Normal compressibility, respiratory phasicity and response to augmentation. Ulnar Veins: No evidence of thrombus. Normal compressibility, respiratory phasicity and response to augmentation. Venous Reflux:  None visualized. Other Findings:  None visualized. IMPRESSION: 1. Positive for deep venous thrombosis involving the paired brachial veins in the subclavian vein with thrombus extending into the innominate vein. The brachial venous DVT is occlusive while the subclavian and innominate DVT is nonocclusive. 2. Positive for superficial venous thrombosis involving the left basilic vein. Signed, Sterling Big, MD [facility] Radiology Electronically Signed   By: Malachy Moan M.D.   On: 01/30/2018 12:58    Procedures Procedures (including critical care time)  Medications Ordered in ED Medications  heparin ADULT infusion 100 units/mL (25000 units/260mL sodium chloride 0.45%) (1,550 Units/hr Intravenous New Bag/Given 01/30/18 1351)  clindamycin (CLEOCIN) IVPB 900 mg (not administered)  oxyCODONE (Oxy IR/ROXICODONE) immediate release tablet 10 mg (10 mg Oral Given 01/30/18 1325)  iopamidol (ISOVUE-370) 76 % injection 100 mL (100 mLs Intravenous Contrast Given 01/30/18 1340)  heparin bolus via infusion 7,000 Units (7,000 Units Intravenous Bolus from Bag 01/30/18 1351)     Initial Impression / Assessment and Plan / ED Course  I have reviewed the triage vital signs and the nursing notes.  Pertinent labs & imaging results that were available during my care of the patient were reviewed by me and considered in my medical decision making (see chart for  details).     57 year old right-hand-dominant female here with left arm pain and redness..  Patient imaging shows left brachial and subclavian DVT.  This involves the innominate.  Patient also with erythema of her volar forearm.  This correlates with area of superficial venous thrombosis in the basilic vein.  Concern for possible thrombosis secondary to recent IV use.  She has no fevers or leukocytosis to suggest separative thrombophlebitis.  Given the extent of her DVT, discussed with Dr. Myra Gianotti on call.  Will plan to admit to [facility] for IV heparin and assessment.  Patient also given clindamycin given her erythema and warm compresses for her thrombosis.  Final Clinical Impressions(s) / ED Diagnoses   Final diagnoses:  Superficial venous thrombosis of left upper extremity  Acute deep vein thrombosis (DVT) of brachial vein of left upper extremity [facility])  Thrombophlebitis    ED Discharge Orders    None       Shaune Pollack, MD 01/30/18 1525

## 2018-01-30 NOTE — ED Triage Notes (Signed)
Red streak on left lower arm following vein, and then pain and tenderness up left upper arm and shoulder to neck x4 days.

## 2018-01-30 NOTE — Progress Notes (Signed)
ANTICOAGULATION CONSULT NOTE  Pharmacy Consult for heparin Indication: DVT, CTA pending  Heparin Dosing Weight: 88 kg  Labs: Recent Labs    01/30/18 1248  HGB 12.1  HCT 37.9  PLT 242  LABPROT 12.2  INR 0.91  CREATININE 1.14*    Assessment: 56 yom presenting with acute DVT, CTA pending. Pharmacy consulted to dose heparin. Not on anticoagulation PTA. CBC wnl. No bleed documented.  Goal of Therapy:  Heparin level 0.3-0.7 units/ml Monitor platelets by anticoagulation protocol: Yes   Plan:  Heparin 7000 unit bolus Start heparin at 1550 units/h 6h heparin level Daily heparin level/CBC Monitor s/sx bleeding  Heather BertinHaley Jennefer Hanson, PharmD, BCPS Clinical Pharmacist 01/30/2018 1:27 PM

## 2018-01-31 ENCOUNTER — Other Ambulatory Visit: Payer: Self-pay

## 2018-01-31 ENCOUNTER — Encounter (HOSPITAL_COMMUNITY)
Admission: EM | Disposition: A | Discharge status: Home Health | Payer: Self-pay | Source: Home / Self Care | Attending: Family Medicine

## 2018-01-31 ENCOUNTER — Encounter (HOSPITAL_COMMUNITY): Payer: Self-pay | Admitting: General Practice

## 2018-01-31 DIAGNOSIS — D696 Thrombocytopenia, unspecified: Secondary | ICD-10-CM | POA: Diagnosis not present

## 2018-01-31 DIAGNOSIS — G40909 Epilepsy, unspecified, not intractable, without status epilepticus: Secondary | ICD-10-CM | POA: Diagnosis present

## 2018-01-31 DIAGNOSIS — I82B12 Acute embolism and thrombosis of left subclavian vein: Secondary | ICD-10-CM | POA: Diagnosis present

## 2018-01-31 DIAGNOSIS — R1313 Dysphagia, pharyngeal phase: Secondary | ICD-10-CM | POA: Diagnosis not present

## 2018-01-31 DIAGNOSIS — E872 Acidosis: Secondary | ICD-10-CM | POA: Diagnosis not present

## 2018-01-31 DIAGNOSIS — N179 Acute kidney failure, unspecified: Secondary | ICD-10-CM | POA: Diagnosis not present

## 2018-01-31 DIAGNOSIS — D638 Anemia in other chronic diseases classified elsewhere: Secondary | ICD-10-CM | POA: Diagnosis present

## 2018-01-31 DIAGNOSIS — E669 Obesity, unspecified: Secondary | ICD-10-CM | POA: Diagnosis present

## 2018-01-31 DIAGNOSIS — E782 Mixed hyperlipidemia: Secondary | ICD-10-CM | POA: Diagnosis not present

## 2018-01-31 DIAGNOSIS — N170 Acute kidney failure with tubular necrosis: Secondary | ICD-10-CM | POA: Diagnosis not present

## 2018-01-31 DIAGNOSIS — I82622 Acute embolism and thrombosis of deep veins of left upper extremity: Secondary | ICD-10-CM | POA: Diagnosis present

## 2018-01-31 DIAGNOSIS — I82612 Acute embolism and thrombosis of superficial veins of left upper extremity: Secondary | ICD-10-CM | POA: Diagnosis present

## 2018-01-31 DIAGNOSIS — R569 Unspecified convulsions: Secondary | ICD-10-CM | POA: Diagnosis not present

## 2018-01-31 DIAGNOSIS — Z79890 Hormone replacement therapy (postmenopausal): Secondary | ICD-10-CM | POA: Diagnosis not present

## 2018-01-31 DIAGNOSIS — K222 Esophageal obstruction: Secondary | ICD-10-CM | POA: Diagnosis present

## 2018-01-31 DIAGNOSIS — N186 End stage renal disease: Secondary | ICD-10-CM | POA: Diagnosis not present

## 2018-01-31 DIAGNOSIS — T508X5A Adverse effect of diagnostic agents, initial encounter: Secondary | ICD-10-CM | POA: Diagnosis not present

## 2018-01-31 DIAGNOSIS — E875 Hyperkalemia: Secondary | ICD-10-CM | POA: Diagnosis not present

## 2018-01-31 DIAGNOSIS — F4321 Adjustment disorder with depressed mood: Secondary | ICD-10-CM | POA: Diagnosis present

## 2018-01-31 DIAGNOSIS — I808 Phlebitis and thrombophlebitis of other sites: Secondary | ICD-10-CM | POA: Diagnosis present

## 2018-01-31 DIAGNOSIS — E876 Hypokalemia: Secondary | ICD-10-CM | POA: Diagnosis present

## 2018-01-31 DIAGNOSIS — N3289 Other specified disorders of bladder: Secondary | ICD-10-CM | POA: Diagnosis not present

## 2018-01-31 DIAGNOSIS — I82290 Acute embolism and thrombosis of other thoracic veins: Secondary | ICD-10-CM | POA: Diagnosis present

## 2018-01-31 DIAGNOSIS — I82409 Acute embolism and thrombosis of unspecified deep veins of unspecified lower extremity: Secondary | ICD-10-CM

## 2018-01-31 DIAGNOSIS — Z6835 Body mass index (BMI) 35.0-35.9, adult: Secondary | ICD-10-CM | POA: Diagnosis not present

## 2018-01-31 DIAGNOSIS — R1310 Dysphagia, unspecified: Secondary | ICD-10-CM | POA: Diagnosis not present

## 2018-01-31 DIAGNOSIS — I809 Phlebitis and thrombophlebitis of unspecified site: Secondary | ICD-10-CM | POA: Diagnosis present

## 2018-01-31 DIAGNOSIS — R310 Gross hematuria: Secondary | ICD-10-CM | POA: Diagnosis not present

## 2018-01-31 DIAGNOSIS — E039 Hypothyroidism, unspecified: Secondary | ICD-10-CM | POA: Diagnosis not present

## 2018-01-31 DIAGNOSIS — R319 Hematuria, unspecified: Secondary | ICD-10-CM | POA: Diagnosis not present

## 2018-01-31 DIAGNOSIS — Y92230 Patient room in hospital as the place of occurrence of the external cause: Secondary | ICD-10-CM | POA: Diagnosis not present

## 2018-01-31 DIAGNOSIS — I8290 Acute embolism and thrombosis of unspecified vein: Secondary | ICD-10-CM | POA: Diagnosis not present

## 2018-01-31 DIAGNOSIS — N141 Nephropathy induced by other drugs, medicaments and biological substances: Secondary | ICD-10-CM | POA: Diagnosis not present

## 2018-01-31 DIAGNOSIS — D62 Acute posthemorrhagic anemia: Secondary | ICD-10-CM | POA: Diagnosis not present

## 2018-01-31 DIAGNOSIS — F418 Other specified anxiety disorders: Secondary | ICD-10-CM | POA: Diagnosis not present

## 2018-01-31 DIAGNOSIS — S7012XA Contusion of left thigh, initial encounter: Secondary | ICD-10-CM | POA: Diagnosis not present

## 2018-01-31 DIAGNOSIS — D569 Thalassemia, unspecified: Secondary | ICD-10-CM | POA: Diagnosis present

## 2018-01-31 DIAGNOSIS — I10 Essential (primary) hypertension: Secondary | ICD-10-CM | POA: Diagnosis present

## 2018-01-31 HISTORY — DX: Acute embolism and thrombosis of unspecified deep veins of unspecified lower extremity: I82.409

## 2018-01-31 HISTORY — PX: PERIPHERAL VASCULAR THROMBECTOMY: CATH118306

## 2018-01-31 LAB — BASIC METABOLIC PANEL
ANION GAP: 9 (ref 5–15)
BUN: 8 mg/dL (ref 6–20)
CALCIUM: 8.5 mg/dL — AB (ref 8.9–10.3)
CO2: 22 mmol/L (ref 22–32)
Chloride: 111 mmol/L (ref 101–111)
Creatinine, Ser: 1.07 mg/dL — ABNORMAL HIGH (ref 0.44–1.00)
GFR calc Af Amer: >60 mL/min (ref >60)
GFR, EST NON AFRICAN AMERICAN: 56 mL/min — AB (ref >60)
Glucose, Bld: 106 mg/dL — ABNORMAL HIGH (ref 65–99)
POTASSIUM: 3.3 mmol/L — AB (ref 3.5–5.1)
Sodium: 142 mmol/L (ref 135–145)

## 2018-01-31 LAB — HEPARIN LEVEL (UNFRACTIONATED)
HEPARIN UNFRACTIONATED: 1.92 [IU]/mL — AB (ref 0.30–0.70)
HEPARIN UNFRACTIONATED: 1.86 [IU]/mL — AB (ref 0.30–0.70)
Heparin Unfractionated: 1.44 IU/mL — ABNORMAL HIGH (ref 0.30–0.70)

## 2018-01-31 LAB — CBC
HCT: 36.1 % (ref 36.0–46.0)
HEMOGLOBIN: 11.4 g/dL — AB (ref 12.0–15.0)
MCH: 22.1 pg — ABNORMAL LOW (ref 26.0–34.0)
MCHC: 31.6 g/dL (ref 30.0–36.0)
MCV: 70.1 fL — ABNORMAL LOW (ref 78.0–100.0)
PLATELETS: 212 10*3/uL (ref 150–400)
RBC: 5.15 MIL/uL — AB (ref 3.87–5.11)
RDW: 16.1 % — ABNORMAL HIGH (ref 11.5–15.5)
WBC: 5.8 10*3/uL (ref 4.0–10.5)

## 2018-01-31 LAB — HIV ANTIBODY (ROUTINE TESTING W REFLEX): HIV SCREEN 4TH GENERATION: NONREACTIVE

## 2018-01-31 LAB — POCT ACTIVATED CLOTTING TIME
ACTIVATED CLOTTING TIME: 296 s
Activated Clotting Time: 186 seconds

## 2018-01-31 LAB — GLUCOSE, CAPILLARY: GLUCOSE-CAPILLARY: 80 mg/dL (ref 65–99)

## 2018-01-31 SURGERY — PERIPHERAL VASCULAR THROMBECTOMY
Anesthesia: LOCAL | Laterality: Left

## 2018-01-31 MED ORDER — POTASSIUM CHLORIDE CRYS ER 20 MEQ PO TBCR
40.0000 meq | EXTENDED_RELEASE_TABLET | Freq: Once | ORAL | Status: AC
  Administered 2018-01-31: 40 meq via ORAL
  Filled 2018-01-31: qty 2

## 2018-01-31 MED ORDER — HEPARIN (PORCINE) IN NACL 2-0.9 UNIT/ML-% IJ SOLN
INTRAMUSCULAR | Status: AC
  Filled 2018-01-31: qty 500

## 2018-01-31 MED ORDER — FAMOTIDINE 20 MG PO TABS
20.0000 mg | ORAL_TABLET | Freq: Two times a day (BID) | ORAL | Status: DC
  Administered 2018-01-31 – 2018-02-02 (×4): 20 mg via ORAL
  Filled 2018-01-31 (×4): qty 1

## 2018-01-31 MED ORDER — ALUM & MAG HYDROXIDE-SIMETH 200-200-20 MG/5ML PO SUSP
30.0000 mL | Freq: Four times a day (QID) | ORAL | Status: DC | PRN
  Administered 2018-01-31: 30 mL via ORAL
  Filled 2018-01-31: qty 30

## 2018-01-31 MED ORDER — OXYCODONE HCL 5 MG PO TABS
ORAL_TABLET | ORAL | Status: AC
Start: 2018-01-31 — End: ?
  Filled 2018-01-31: qty 2

## 2018-01-31 MED ORDER — SODIUM CHLORIDE 0.9% FLUSH
3.0000 mL | INTRAVENOUS | Status: DC | PRN
  Administered 2018-02-02: 3 mL via INTRAVENOUS
  Filled 2018-01-31: qty 3

## 2018-01-31 MED ORDER — IODIXANOL 320 MG/ML IV SOLN
INTRAVENOUS | Status: DC | PRN
  Administered 2018-01-31: 40 mL via INTRAVENOUS

## 2018-01-31 MED ORDER — LABETALOL HCL 5 MG/ML IV SOLN
INTRAVENOUS | Status: AC
  Filled 2018-01-31: qty 4

## 2018-01-31 MED ORDER — HEPARIN (PORCINE) IN NACL 100-0.45 UNIT/ML-% IJ SOLN
1300.0000 [IU]/h | INTRAMUSCULAR | Status: DC
  Administered 2018-01-31: 1050 [IU]/h via INTRAVENOUS
  Administered 2018-01-31: 1300 [IU]/h via INTRAVENOUS
  Filled 2018-01-31 (×3): qty 250

## 2018-01-31 MED ORDER — MIDAZOLAM HCL 2 MG/2ML IJ SOLN
INTRAMUSCULAR | Status: DC | PRN
  Administered 2018-01-31 (×2): 1 mg via INTRAVENOUS

## 2018-01-31 MED ORDER — SODIUM CHLORIDE 0.9 % IV SOLN
INTRAVENOUS | Status: AC
  Administered 2018-01-31: 13:00:00 via INTRAVENOUS
  Filled 2018-01-31: qty 10

## 2018-01-31 MED ORDER — HEPARIN SODIUM (PORCINE) 1000 UNIT/ML IJ SOLN
INTRAMUSCULAR | Status: DC | PRN
  Administered 2018-01-31: 10000 [IU] via INTRAVENOUS

## 2018-01-31 MED ORDER — SODIUM CHLORIDE 0.9 % IV SOLN: INTRAVENOUS | Status: DC

## 2018-01-31 MED ORDER — SODIUM CHLORIDE 0.9 % IV SOLN: INTRAVENOUS | Status: AC

## 2018-01-31 MED ORDER — SODIUM CHLORIDE 0.9 % IV SOLN: 250.0000 mL | INTRAVENOUS | Status: DC | PRN

## 2018-01-31 MED ORDER — LIDOCAINE HCL 1 % IJ SOLN
INTRAMUSCULAR | Status: AC
  Filled 2018-01-31: qty 20

## 2018-01-31 MED ORDER — MIDAZOLAM HCL 2 MG/2ML IJ SOLN
INTRAMUSCULAR | Status: AC
  Filled 2018-01-31: qty 2

## 2018-01-31 MED ORDER — SODIUM CHLORIDE 0.9% FLUSH
3.0000 mL | Freq: Two times a day (BID) | INTRAVENOUS | Status: DC
Start: 2018-01-31 — End: 2018-02-19
  Administered 2018-02-01 – 2018-02-19 (×18): 3 mL via INTRAVENOUS

## 2018-01-31 MED ORDER — HEPARIN (PORCINE) IN NACL 2-0.9 UNIT/ML-% IJ SOLN
INTRAMUSCULAR | Status: AC | PRN
  Administered 2018-01-31: 500 mL

## 2018-01-31 MED ORDER — LIDOCAINE HCL (PF) 1 % IJ SOLN
INTRAMUSCULAR | Status: DC | PRN
  Administered 2018-01-31: 10 mL via INTRADERMAL

## 2018-01-31 MED ORDER — SODIUM CHLORIDE 0.9 % IV SOLN
INTRAVENOUS | Status: DC
  Administered 2018-01-31 – 2018-02-01 (×2): via INTRAVENOUS

## 2018-01-31 MED ORDER — LABETALOL HCL 5 MG/ML IV SOLN
10.0000 mg | INTRAVENOUS | Status: DC | PRN
  Administered 2018-01-31 (×2): 10 mg via INTRAVENOUS
  Filled 2018-01-31: qty 4

## 2018-01-31 MED ORDER — FENTANYL CITRATE (PF) 100 MCG/2ML IJ SOLN
INTRAMUSCULAR | Status: DC | PRN
  Administered 2018-01-31 (×2): 50 ug via INTRAVENOUS

## 2018-01-31 MED ORDER — FENTANYL CITRATE (PF) 100 MCG/2ML IJ SOLN
INTRAMUSCULAR | Status: AC
  Filled 2018-01-31: qty 2

## 2018-01-31 SURGICAL SUPPLY — 11 items
BAG SNAP BAND KOVER 36X36 (MISCELLANEOUS) ×1 IMPLANT
CATH STRAIGHT 5FR 65CM (CATHETERS) ×1 IMPLANT
COVER DOME SNAP 22 D (MISCELLANEOUS) ×1 IMPLANT
COVER PRB 48X5XTLSCP FOLD TPE (BAG) IMPLANT
COVER PROBE 5X48 (BAG) ×2
DEVICE SOLENT PROXI 90CM (CATHETERS) ×1 IMPLANT
KIT MICROPUNCTURE NIT STIFF (SHEATH) ×1 IMPLANT
SHEATH PINNACLE R/O II 6F 4CM (SHEATH) ×1 IMPLANT
TRAY PV CATH (CUSTOM PROCEDURE TRAY) ×1 IMPLANT
WIRE BENTSON .035X145CM (WIRE) ×1 IMPLANT
WIRE ROSEN-J .035X260CM (WIRE) ×1 IMPLANT

## 2018-01-31 NOTE — Consult Note (Signed)
[facility]  Patient name: Heather Hanson MRN: [id number] DOB: [birth year] Sex: female   REQUESTING PROVIDER:    Hospital service   REASON FOR CONSULT:    Left arm DVT  HISTORY OF PRESENT ILLNESS:   Heather Hanson is a 57 y.o. female, who presented to the [facility] med center with left arm pain and swelling for the past 4 days which had been getting worse.  She also felt a hard cord in her arm crease.  The pain is constant.  She denies any trauma.  She does not do any work with her arms elevated.  She has a history of bone spurs.  She denies any chest pain or shortness of breath.  She has never had clotting issues in the past.  She had an ultrasound which showed left upper extremity DVT.  She was started on heparin and sent to [facility].  The patient was recently admitted for a suicide attempt with narcotic overdose.  She does suffer from an adjustment disorder with depression.  She suffers from hypercholesterolemia but is not taking any medications.  She is a non-smoker.  She is medically managed for hypertension.  PAST MEDICAL HISTORY    Past Medical History:  Diagnosis Date  . Adjustment disorder with mixed anxiety and depressed mood 09/28/2009   Qualifier: Diagnosis of  By: Nena JordanYoo DO, D. Robert   . Allergic state 04/09/2013  . Anemia    thalassemia  . Anxiety    takes Ativan daily prn  . Arthritis    sees Dr.Beekman  . Bursitis of right shoulder   . Cholecystitis, acute 08/30/2013  . Chronic insomnia   . Complication of anesthesia    hard to sedate  . Constipation    takes Linzess daily prn  . Depression    takes Effexor daily  . Difficulty swallowing solids   . Fibromyalgia   . GERD (gastroesophageal reflux disease) 06/17/2013   takes Dexilant daily prn  . H. pylori infection 07/19/2013  . H/O hiatal hernia   . Headache(784.0) 05/22/2013  . Hereditary and idiopathic peripheral neuropathy 03/14/2015  .  History of bronchitis    last time many yrs ago  . History of colonoscopy 2000  . Hyperlipidemia    not on any meds   . Hypoglycemia   . Hypothyroidism   . Lichen    [facility]leisher at [facility]  . Migraines    takes Topamax daily-last migraine today  . Muscle spasms of head and/or neck    takes Norflex daily prn  . Nausea    takes Phenergan prn  . Obesity   . Peripheral edema    takes Lasix prn  . PONV (postoperative nausea and vomiting)    with tonsil removal surgery  . PUD (peptic ulcer disease)    Dr Loreta AveMann ( H. Pylori)  . Seizure disorder (HCC) x 2   presumed secondary to ativan withdrawal -Dr Sharene SkeansHickling  . Seizures (HCC)    X 2  . Shortness of breath    pt states when Fibromyalgia flares up  . Sinusitis, acute 04/09/2013   takes Zyrtec daily     FAMILY HISTORY   Family History  Problem Relation Age of Onset  . Diabetes Mother        typer 2  . Heart disease Mother        PVD, PAD  . Cancer Father        prostate, lung, brain mets, smoker  .  COPD Father   . Hyperlipidemia Father   . Hypertension Father   . Thyroid disease Father   . Hypertension Sister   . Thyroid disease Sister   . Arthritis Sister   . Diabetes Son 16       type 1  . Diabetes Maternal Grandmother   . Stroke Maternal Grandfather   . Cancer Paternal Grandmother 5       breast  . Alcohol abuse Paternal Grandfather   . Hypertension Sister   . Obesity Sister   . Psoriasis Son   . Cancer Unknown        Breast < 33 yo, ,Prostate <50 yo  . Coronary artery disease Unknown        <60 yo  . Diabetes Unknown     SOCIAL HISTORY:   Social History   Socioeconomic History  . Marital status: Divorced    Spouse name: Not on file  . Number of children: 2  . Years of education: Not on file  . Highest education level: Not on file  Social Needs  . Financial resource strain: Not on file  . Food insecurity - worry: Not on file  . Food insecurity - inability: Not on file  . Transportation needs -  medical: Not on file  . Transportation needs - non-medical: Not on file  Occupational History  . Occupation: LAB TECH    Employer: [employer]    Comment: phlebotomist  Tobacco Use  . Smoking status: Never Smoker  . Smokeless tobacco: Never Used  Substance and Sexual Activity  . Alcohol use: No    Frequency: Never  . Drug use: No    Comment: Rare  . Sexual activity: Yes    Birth control/protection: Surgical    Comment: lives with,   Other Topics Concern  . Not on file  Social History Narrative   Occupation: Water quality scientist   Divorced     2 sons 26, 3      Never Smoked     Alcohol use-no       ALLERGIES:    Allergies  Allergen Reactions  . Aspirin Other (See Comments)    Per Dr  . Augmentin [Amoxicillin-Pot Clavulanate] Other (See Comments)    Blisters in the back of throat  . Dexilant [Dexlansoprazole] Other (See Comments)    Bowel urgency and liquid stools  . Hydrocodone-Acetaminophen Hives    REACTION: Rash  . Influenza Vaccines Swelling    Arm swelled to the size of a grapefruit  . Levsin [Hyoscyamine Sulfate] Other (See Comments)    Projectile vomitting  . Linzess [Linaclotide] Other (See Comments)    Bowel urgency and liquid stools  . Promethazine Nausea And Vomiting  . Propoxyphene N-Acetaminophen Itching    REACTION: rash  . Statins Other (See Comments)    myalgia  . Acetaminophen Itching and Rash  . Latex Itching and Rash    Rash-looks like she has the measles per pt  . Oxycodone-Acetaminophen Itching and Rash    REACTION: Rash    CURRENT MEDICATIONS:    Current Facility-Administered Medications  Medication Dose Route Frequency Provider Last Rate Last Dose  . ARIPiprazole (ABILIFY) tablet 2 mg  2 mg Oral Daily Lorretta Harp, MD   2 mg at 01/31/18 0853  . ezetimibe (ZETIA) tablet 10 mg  10 mg Oral Daily Lorretta Harp, MD   10 mg at 01/31/18 0853  . gabapentin (NEURONTIN) capsule 600 mg  600 mg Oral TID Lorretta Harp, MD  600 mg at 01/31/18 0852  .  heparin ADULT infusion 100 units/mL (25000 units/212mL sodium chloride 0.45%)  1,300 Units/hr Intravenous Continuous Baldemar Friday, [facility] 13 mL/hr at 01/31/18 1610 1,300 Units/hr at 01/31/18 9604  . hydrALAZINE (APRESOLINE) injection 5 mg  5 mg Intravenous Q2H PRN Lorretta Harp, MD      . hydrOXYzine (ATARAX/VISTARIL) tablet 50 mg  50 mg Oral TID PRN Lorretta Harp, MD   50 mg at 01/31/18 0857  . levothyroxine (SYNTHROID, LEVOTHROID) tablet 25 mcg  25 mcg Oral QAC breakfast Lorretta Harp, MD   25 mcg at 01/31/18 [phone]  . LORazepam (ATIVAN) injection 1 mg  1 mg Intravenous Q2H PRN Lorretta Harp, MD      . mirtazapine (REMERON) tablet 15 mg  15 mg Oral QHS Lorretta Harp, MD   15 mg at 01/30/18 2235  . ondansetron (ZOFRAN) tablet 4 mg  4 mg Oral Q6H PRN Lorretta Harp, MD       Or  . ondansetron [facility]) injection 4 mg  4 mg Intravenous Q6H PRN Lorretta Harp, MD      . oxyCODONE (Oxy IR/ROXICODONE) immediate release tablet 10 mg  10 mg Oral Q6H PRN Lorretta Harp, MD   10 mg at 01/31/18 8119  . topiramate (TOPAMAX) tablet 100 mg  100 mg Oral BID Lorretta Harp, MD   100 mg at 01/31/18 0853  . venlafaxine XR (EFFEXOR-XR) 24 hr capsule 225 mg  225 mg Oral Q breakfast Lorretta Harp, MD   225 mg at 01/31/18 1478  . zolpidem (AMBIEN) tablet 5 mg  5 mg Oral QHS PRN Lorretta Harp, MD   5 mg at 01/30/18 2235    REVIEW OF SYSTEMS:   [X]  denotes positive finding, [ ]  denotes negative finding Cardiac  Comments:  Chest pain or chest pressure:    Shortness of breath upon exertion:    Short of breath when lying flat:    Irregular heart rhythm:        Vascular    Pain in calf, thigh, or hip brought on by ambulation:    Pain in feet at night that wakes you up from your sleep:     Blood clot in your veins:    Leg swelling:         Pulmonary    Oxygen at home:    Productive cough:     Wheezing:         Neurologic    Sudden weakness in arms or legs:     Sudden numbness in arms or legs:     Sudden onset of difficulty speaking or slurred  speech:    Temporary loss of vision in one eye:     Problems with dizziness:         Gastrointestinal    Blood in stool:      Vomited blood:         Genitourinary    Burning when urinating:     Blood in urine:        Psychiatric    Major depression:         Hematologic    Bleeding problems:    Problems with blood clotting too easily:        Skin    Rashes or ulcers:        Constitutional    Fever or chills:     PHYSICAL EXAM:   Vitals:   01/30/18 1729 01/30/18 2014 01/31/18 0142 01/31/18 0522  BP: 139/90 (!) 149/93  139/76  Pulse: 77 72  76  Resp: 18 14  15   Temp: 97.9 F (36.6 C) (!) 96.4 F (35.8 C) 98.2 F (36.8 C) 98.7 F (37.1 C)  TempSrc: Oral Oral Oral Oral  SpO2: 99% 99%  98%  Weight:      Height:        GENERAL: The patient is a well-nourished female, in no acute distress. The vital signs are documented above. CARDIAC: There is a regular rate and rhythm.  VASCULAR: Left arm is edematous when compared to the right. PULMONARY: Nonlabored respirations MUSCULOSKELETAL: There are no major deformities or cyanosis. NEUROLOGIC: No focal weakness or paresthesias are detected. SKIN: There are no ulcers or rashes noted. PSYCHIATRIC: The patient has a normal affect.  STUDIES:   I have reviewed her ultrasound with the following findings 1. deep venous thrombosis involving the paired brachial veins in the subclavian vein with thrombus extending into the innominate vein. The brachial venous DVT is occlusive while the subclavian and innominate DVT is nonocclusive. 2. Positive for superficial venous thrombosis involving the left basilic vein.    ASSESSMENT and PLAN   Left upper extremity DVT: I discussed with the patient our treatment options which are IV heparinization and anticoagulation versus an attempt at thrombolysis.  We discussed the risks and benefits of thrombolysis including the risk of bleeding and the inability to remove all of the clot.  Because of  her symptomatology, I think she would benefit from thrombolyzes.  She potentially could have a mechanical reason for her occlusion.  She may need first rib resection at a later date.  I discussed placing a catheter for thrombolyzes today and possible clot extraction versus running TPA overnight and following up tomorrow.  She understands this and wishes to proceed.   Durene Cal, MD [facility] [phone] Pager [phone]

## 2018-01-31 NOTE — H&P (View-Only) (Signed)
[facility]  Patient name: Heather Hanson MRN: [id number] DOB: [birth year] Sex: female   REQUESTING PROVIDER:    Hospital service   REASON FOR CONSULT:    Left arm DVT  HISTORY OF PRESENT ILLNESS:   Heather Hanson is a 57 y.o. female, who presented to the [facility] center with left arm pain and swelling for the past 4 days which had been getting worse.  She also felt a hard cord in her arm crease.  The pain is constant.  She denies any trauma.  She does not do any work with her arms elevated.  She has a history of bone spurs.  She denies any chest pain or shortness of breath.  She has never had clotting issues in the past.  She had an ultrasound which showed left upper extremity DVT.  She was started on heparin and sent to [facility].  The patient was recently admitted for a suicide attempt with narcotic overdose.  She does suffer from an adjustment disorder with depression.  She suffers from hypercholesterolemia but is not taking any medications.  She is a non-smoker.  She is medically managed for hypertension.  PAST MEDICAL HISTORY    Past Medical History:  Diagnosis Date  . Adjustment disorder with mixed anxiety and depressed mood 09/28/2009   Qualifier: Diagnosis of  By: Nena JordanYoo DO, D. Robert   . Allergic state 04/09/2013  . Anemia    thalassemia  . Anxiety    takes Ativan daily prn  . Arthritis    sees Dr.Beekman  . Bursitis of right shoulder   . Cholecystitis, acute 08/30/2013  . Chronic insomnia   . Complication of anesthesia    hard to sedate  . Constipation    takes Linzess daily prn  . Depression    takes Effexor daily  . Difficulty swallowing solids   . Fibromyalgia   . GERD (gastroesophageal reflux disease) 06/17/2013   takes Dexilant daily prn  . H. pylori infection 07/19/2013  . H/O hiatal hernia   . Headache(784.0) 05/22/2013  . Hereditary and idiopathic peripheral neuropathy 03/14/2015  .  History of bronchitis    last time many yrs ago  . History of colonoscopy 2000  . Hyperlipidemia    not on any meds   . Hypoglycemia   . Hypothyroidism   . Lichen    [facility]Fleisher at [facility]  . Migraines    takes Topamax daily-last migraine today  . Muscle spasms of head and/or neck    takes Norflex daily prn  . Nausea    takes Phenergan prn  . Obesity   . Peripheral edema    takes Lasix prn  . PONV (postoperative nausea and vomiting)    with tonsil removal surgery  . PUD (peptic ulcer disease)    Dr Loreta AveMann ( H. Pylori)  . Seizure disorder (HCC) x 2   presumed secondary to ativan withdrawal -Dr Sharene SkeansHickling  . Seizures (HCC)    X 2  . Shortness of breath    pt states when Fibromyalgia flares up  . Sinusitis, acute 04/09/2013   takes Zyrtec daily     FAMILY HISTORY   Family History  Problem Relation Age of Onset  . Diabetes Mother        typer 2  . Heart disease Mother        PVD, PAD  . Cancer Father        prostate, lung, brain mets, smoker  .  COPD Father   . Hyperlipidemia Father   . Hypertension Father   . Thyroid disease Father   . Hypertension Sister   . Thyroid disease Sister   . Arthritis Sister   . Diabetes Son 16       type 1  . Diabetes Maternal Grandmother   . Stroke Maternal Grandfather   . Cancer Paternal Grandmother 5       breast  . Alcohol abuse Paternal Grandfather   . Hypertension Sister   . Obesity Sister   . Psoriasis Son   . Cancer Unknown        Breast < 33 yo, ,Prostate <50 yo  . Coronary artery disease Unknown        <60 yo  . Diabetes Unknown     SOCIAL HISTORY:   Social History   Socioeconomic History  . Marital status: Divorced    Spouse name: Not on file  . Number of children: 2  . Years of education: Not on file  . Highest education level: Not on file  Social Needs  . Financial resource strain: Not on file  . Food insecurity - worry: Not on file  . Food insecurity - inability: Not on file  . Transportation needs -  medical: Not on file  . Transportation needs - non-medical: Not on file  Occupational History  . 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 Smoking status: Never Smoker  . Smokeless tobacco: Never Used  Substance and Sexual Activity  . Alcohol use: No    Frequency: Never  . Drug use: No    Comment: Rare  . Sexual activity: Yes    Birth control/protection: Surgical    Comment: lives with,   Other Topics Concern  . Not on file  Social History Narrative   Occupation: Water quality scientist   Divorced     2 sons 26, 3      Never Smoked     Alcohol use-no       ALLERGIES:    Allergies  Allergen Reactions  . Aspirin Other (See Comments)    Per Dr  . Augmentin [Amoxicillin-Pot Clavulanate] Other (See Comments)    Blisters in the back of throat  . Dexilant [Dexlansoprazole] Other (See Comments)    Bowel urgency and liquid stools  . Hydrocodone-Acetaminophen Hives    REACTION: Rash  . Influenza Vaccines Swelling    Arm swelled to the size of a grapefruit  . Levsin [Hyoscyamine Sulfate] Other (See Comments)    Projectile vomitting  . Linzess [Linaclotide] Other (See Comments)    Bowel urgency and liquid stools  . Promethazine Nausea And Vomiting  . Propoxyphene N-Acetaminophen Itching    REACTION: rash  . Statins Other (See Comments)    myalgia  . Acetaminophen Itching and Rash  . Latex Itching and Rash    Rash-looks like she has the measles per pt  . Oxycodone-Acetaminophen Itching and Rash    REACTION: Rash    CURRENT MEDICATIONS:    Current Facility-Administered Medications  Medication Dose Route Frequency Provider Last Rate Last Dose  . ARIPiprazole (ABILIFY) tablet 2 mg  2 mg Oral Daily Lorretta Harp, MD   2 mg at 01/31/18 0853  . ezetimibe (ZETIA) tablet 10 mg  10 mg Oral Daily Lorretta Harp, MD   10 mg at 01/31/18 0853  . gabapentin (NEURONTIN) capsule 600 mg  600 mg Oral TID Lorretta Harp, MD  600 mg at 01/31/18 0852  .  heparin ADULT infusion 100 units/mL (25000 units/212mL sodium chloride 0.45%)  1,300 Units/hr Intravenous Continuous Baldemar Friday, [facility] 13 mL/hr at 01/31/18 1610 1,300 Units/hr at 01/31/18 9604  . hydrALAZINE (APRESOLINE) injection 5 mg  5 mg Intravenous Q2H PRN Lorretta Harp, MD      . hydrOXYzine (ATARAX/VISTARIL) tablet 50 mg  50 mg Oral TID PRN Lorretta Harp, MD   50 mg at 01/31/18 0857  . levothyroxine (SYNTHROID, LEVOTHROID) tablet 25 mcg  25 mcg Oral QAC breakfast Lorretta Harp, MD   25 mcg at 01/31/18 [phone]  . LORazepam (ATIVAN) injection 1 mg  1 mg Intravenous Q2H PRN Lorretta Harp, MD      . mirtazapine (REMERON) tablet 15 mg  15 mg Oral QHS Lorretta Harp, MD   15 mg at 01/30/18 2235  . ondansetron (ZOFRAN) tablet 4 mg  4 mg Oral Q6H PRN Lorretta Harp, MD       Or  . ondansetron [facility]) injection 4 mg  4 mg Intravenous Q6H PRN Lorretta Harp, MD      . oxyCODONE (Oxy IR/ROXICODONE) immediate release tablet 10 mg  10 mg Oral Q6H PRN Lorretta Harp, MD   10 mg at 01/31/18 8119  . topiramate (TOPAMAX) tablet 100 mg  100 mg Oral BID Lorretta Harp, MD   100 mg at 01/31/18 0853  . venlafaxine XR (EFFEXOR-XR) 24 hr capsule 225 mg  225 mg Oral Q breakfast Lorretta Harp, MD   225 mg at 01/31/18 1478  . zolpidem (AMBIEN) tablet 5 mg  5 mg Oral QHS PRN Lorretta Harp, MD   5 mg at 01/30/18 2235    REVIEW OF SYSTEMS:   [X]  denotes positive finding, [ ]  denotes negative finding Cardiac  Comments:  Chest pain or chest pressure:    Shortness of breath upon exertion:    Short of breath when lying flat:    Irregular heart rhythm:        Vascular    Pain in calf, thigh, or hip brought on by ambulation:    Pain in feet at night that wakes you up from your sleep:     Blood clot in your veins:    Leg swelling:         Pulmonary    Oxygen at home:    Productive cough:     Wheezing:         Neurologic    Sudden weakness in arms or legs:     Sudden numbness in arms or legs:     Sudden onset of difficulty speaking or slurred  speech:    Temporary loss of vision in one eye:     Problems with dizziness:         Gastrointestinal    Blood in stool:      Vomited blood:         Genitourinary    Burning when urinating:     Blood in urine:        Psychiatric    Major depression:         Hematologic    Bleeding problems:    Problems with blood clotting too easily:        Skin    Rashes or ulcers:        Constitutional    Fever or chills:     PHYSICAL EXAM:   Vitals:   01/30/18 1729 01/30/18 2014 01/31/18 0142 01/31/18 0522  BP: 139/90 (!) 149/93  139/76  Pulse: 77 72  76  Resp: 18 14  15   Temp: 97.9 F (36.6 C) (!) 96.4 F (35.8 C) 98.2 F (36.8 C) 98.7 F (37.1 C)  TempSrc: Oral Oral Oral Oral  SpO2: 99% 99%  98%  Weight:      Height:        GENERAL: The patient is a well-nourished female, in no acute distress. The vital signs are documented above. CARDIAC: There is a regular rate and rhythm.  VASCULAR: Left arm is edematous when compared to the right. PULMONARY: Nonlabored respirations MUSCULOSKELETAL: There are no major deformities or cyanosis. NEUROLOGIC: No focal weakness or paresthesias are detected. SKIN: There are no ulcers or rashes noted. PSYCHIATRIC: The patient has a normal affect.  STUDIES:   I have reviewed her ultrasound with the following findings 1. deep venous thrombosis involving the paired brachial veins in the subclavian vein with thrombus extending into the innominate vein. The brachial venous DVT is occlusive while the subclavian and innominate DVT is nonocclusive. 2. Positive for superficial venous thrombosis involving the left basilic vein.    ASSESSMENT and PLAN   Left upper extremity DVT: I discussed with the patient our treatment options which are IV heparinization and anticoagulation versus an attempt at thrombolysis.  We discussed the risks and benefits of thrombolysis including the risk of bleeding and the inability to remove all of the clot.  Because of  her symptomatology, I think she would benefit from thrombolyzes.  She potentially could have a mechanical reason for her occlusion.  She may need first rib resection at a later date.  I discussed placing a catheter for thrombolyzes today and possible clot extraction versus running TPA overnight and following up tomorrow.  She understands this and wishes to proceed.   Durene Cal, MD [facility] [phone] Pager [phone]

## 2018-01-31 NOTE — Progress Notes (Addendum)
ANTICOAGULATION CONSULT NOTE - Follow Up Consult  Pharmacy Consult for heparin Indication: DVT  Labs: Recent Labs    01/30/18 1248  01/30/18 2249 01/31/18 0314 01/31/18 1135 01/31/18 2219  HGB 12.1  --  12.3 11.4*  --   --   HCT 37.9  --  39.0 36.1  --   --   PLT 242  --  212 212  --   --   LABPROT 12.2  --   --   --   --   --   INR 0.91  --   --   --   --   --   HEPARINUNFRC  --    < > 1.92* 1.86* 1.44* <0.10*  CREATININE 1.14*  --   --  1.07*  --   --   TROPONINI <0.03  --   --   --   --   --    < > = values in this interval not displayed.    Assessment: 57yo female now subtherapeutic on heparin after rate change.  Goal of Therapy:  Heparin level 0.3-0.7 units/ml   Plan:  Will increase heparin gtt by 2-3 units/kg/hr to 13 units/hr and check level in 6 hours.    Vernard GamblesVeronda Renata Gambino, PharmD, BCPS  01/31/2018,11:42 PM   Addendum: This am heparin level is back to 1.46 (undetectable prior to rate change, drawn in same location at foot, distal from heparin infusion).  Will hold heparin gtt x3160min then resume at lower rate of 1100 units/hr and check level 8hr after resuming. VB  02/01/2018 7:01 AM

## 2018-01-31 NOTE — Progress Notes (Signed)
ANTICOAGULATION CONSULT NOTE - Follow Up Consult  Pharmacy Consult for Heparin Indication: DVT  Patient Measurements: Height: 5\' 8"  (172.7 cm) Weight: 236 lb (107 kg) IBW/kg (Calculated) : 63.9 Heparin Dosing Weight: 88 kg  Vital Signs: BP: 165/84 (02/21 1744) Pulse Rate: 64 (02/21 1744)  Labs: Recent Labs    01/30/18 1248 01/30/18 2249 01/31/18 0314 01/31/18 1135  HGB 12.1 12.3 11.4*  --   HCT 37.9 39.0 36.1  --   PLT 242 212 212  --   LABPROT 12.2  --   --   --   INR 0.91  --   --   --   HEPARINUNFRC  --  1.92* 1.86* 1.44*  CREATININE 1.14*  --  1.07*  --   TROPONINI <0.03  --   --   --     Estimated Creatinine Clearance: 73.4 mL/min (A) (by C-G formula based on SCr of 1.07 mg/dL (H)).  Assessment:   57 yr old female presented with acute DVT on 01/30/18.   Pharmacy consulted for IV heparin dosing.      Initial heparin level was supratherapeutic (1.92) on 1550 units/hr; level repeated to confirm (1.86).   Drip was held x 1 hr then resumed at 1300 units/hr.  Next heparin level was 1.44, [facility].      Now s/p Angiojet thrombectomy with Alteplase.  Discussed briefly with Dr. Imogene Burnhen.  IV heparin was continued through procedure. Also received heparin 10,000 units during procedure.   Venous brachial sheath to be pulled in cath holding area.  Goal of Therapy:  Heparin level 0.3-0.7 units/ml Monitor platelets by anticoagulation protocol: Yes   Plan:    Heparin infusion rate decreased to 1050 units/hr in holding area.   Next heparin level ~6 hr after rate change.   Daily heparin level and CBC.   Follow up anticoagulation plans.  Dennie FettersEgan, Janique Hoefer Donovan, RPh Pager: [phone] 01/31/2018,5:59 PM

## 2018-01-31 NOTE — Interval H&P Note (Signed)
   History and Physical Update  The patient was interviewed and re-examined.  The patient's previous History and Physical has been reviewed and is unchanged from Dr. Estanislado SpireBrabham's consult.  LUE DVT duplex demonstrates occlusive DVT in brachial vein and non-occlusive clot in L SCV and innominate vein.  Will plan on: L arm venogram, IVUS, rheolytic thrombectomy L arm, and possible thrombolytic catheter placement.   I discussed with the patient the nature of angiographic procedures, especially the limited patencies of any endovascular intervention.    The patient is aware of that the risks of an angiographic procedure include but are not limited to: bleeding, infection, access site complications, renal failure, embolization, rupture of vessel, dissection, arteriovenous fistula, possible need for emergent surgical intervention, possible need for surgical procedures to treat the patient's pathology, anaphylactic reaction to contrast, and stroke and death.    The patient is aware that use of thrombolysis agent have the risk of systemic bleeding.  The patient is aware of the risks and agrees to proceed.  Leonides SakeBrian Jermaine Tholl, MD, FACS [facility] [place] Office: [phone] Pager: [phone]  01/31/2018, 12:31 PM

## 2018-01-31 NOTE — Progress Notes (Signed)
ANTICOAGULATION CONSULT NOTE  Pharmacy Consult for heparin Indication: DVT, CTA pending  Heparin Dosing Weight: 88 kg  Labs: Recent Labs    01/30/18 1248 01/30/18 2249 01/31/18 0314  HGB 12.1 12.3 11.4*  HCT 37.9 39.0 36.1  PLT 242 212 212  LABPROT 12.2  --   --   INR 0.91  --   --   HEPARINUNFRC  --  1.92* 1.86*  CREATININE 1.14*  --  1.07*  TROPONINI <0.03  --   --     Assessment: [age] yom presenting with acute DVT, CTA pending. Pharmacy consulted to dose heparin. Not on anticoagulation PTA.   Heparin level returned supratherapeutic. I re-checked the level as it did not seem accurate given the rate it was running at and her weight - the second level also returned supratherapeutic. Discussed with RN, it was drawn appropriately; she does not have many places able to draw labs from so it was done as a feet stick. No bleeding issues.   Goal of Therapy:  Heparin level 0.3-0.7 units/ml Monitor platelets by anticoagulation protocol: Yes   Plan:  -Hold heparin for 1 hour -Reduce to 1300 units/hr -Check 6 hour level -Daily HL, CBC -Discussed with RN   Baldemar FridayMasters, Taija Mathias M  01/31/2018 4:53 AM

## 2018-01-31 NOTE — Op Note (Addendum)
OPERATIVE NOTE   PROCEDURE: 1.  Left brachial vein cannulation under ultrasound guidance 2.  Superior vena cavogram 3.  Intravenous administration of tPA 4.  Rheolytic thrombectomy of left innominate vein, subclavian vein, axillary vein, and brachial vein 5.  Conscious sedation for 62 minutes  PRE-OPERATIVE DIAGNOSIS: symptomatic left arm and central deep vein thrombosis   POST-OPERATIVE DIAGNOSIS: same as above   SURGEON: Leonides Sake, MD  ANESTHESIA: conscious sedation  ESTIMATED BLOOD LOSS: 50 cc  CONTRAST: 40 cc  FINDING(S):  Patent superior vena cava without obvious thrombus  Initially no backbleeding from brachial vein  Widely patent left innominate vein, subclavian vein, axillary vein after Angiojet thrombectomy  After thrombectomy, patent brachial vein with wide patency of proximal half of cannulated vein.  Residual thrombus in distal half of brachial vein with recannulation of flow channel as evident by ability to aspirate from sheath at end of case.  SPECIMEN(S):  none  INDICATIONS:   Heather Hanson is a 57 y.o. female who presents with symptomatic left arm and central deep vein thrombosis.  Dr. Myra Gianotti felt the patient needed thrombolysis vs rheolytic thrombectomy to help with her symptoms.  I discussed with the patient the nature of angiographic procedures, especially the limited patencies of any endovascular intervention.  The patient is aware of that the risks of an angiographic procedure include but are not limited to: bleeding, infection, access site complications, renal failure, embolization, rupture of vessel, dissection, possible need for emergent surgical intervention, possible need for surgical procedures to treat the patient's pathology, and stroke and death.  I also discuss the risk of systemic bleeding with use of thrombolytic agents.  The patient is aware of the risks and agrees to proceed.  DESCRIPTION: After full informed consent was obtained  from the patient, the patient was brought back to the angiography suite.  The patient was placed supine upon the angiography table and connected to cardiopulmonary monitoring equipment.  The patient was then given conscious sedation, the amounts of which are documented in the patient's chart.  A circulating radiologic technician maintained continuous monitoring of the patient's cardiopulmonary status.  Additionally, the control room radiologic technician provided backup monitoring throughout the procedure.  The patient was prepped and drape in the standard fashion for an angiographic procedure.  At this point, attention was turned to the left antecubitum.  Under Sonosite guidance, I cannulated a thrombosed brachial vein with a micropuncture needle.  I passed the microwire into the upper arm and then exchanged the needle for the microsheath.  I placed a Bentson wire into the proximal 1/3 of the left arm, where the wire curled.  I exchanged the sheath for a short 6-Fr sheath.  I loaded a straight catheter over the wire and using this combination to get into the superior vena cava.  I had been periodically aspirating from the catheter and did not get good withdrawal of blood until I got into the superior vena cava.  I did a hand injection to demonstrate patency of the superior vena cava and right atrium.  At this point, I exchanged the wire for a Rosen wire.  The Angiojet rheolytic thrombectomy system was prepared.  Meanwhile an ACT was drawn. The initial ACT obtained was sub-therapeutic despite active heparin being infused.  I gave another 10000 units of Heparin intravenously.  I connected the Solent catheter to the Angiojet device and primed the device.  The device was placed over the wire into the brachial vein.  The Powerpulse mode  of the device was engaged.  A total of 10 mg of tPA was delivered via Powerpulse mode from the brachial vein to the proximal left innominate vein in 1-2 cm intervals.  The catheter was  removed off the wire.  I waited 20 minutes for the tPA to lysis the deep vein thrombosis in this left arm venous system.  I then replaced the Solent catheter and performed 140 seconds of rheolytic thrombectomy from the left brachial vein up to the proximal left innominate vein.  I removed the catheter and then did hand injections to image the above findings.  The central venous system was widely patent and the proximal half of the brachial vein cannulated was widely patent.  I replaced the Solent catheter and completed another 60 seconds of rheolytic thrombectomy in the distal half of the brachial vein for a total of 200 seconds.  The catheter was removed.    On completion venogram, there was a patent channel in the distal brachial vein with evidence of some residual thrombus in the vein.  At this point, I could aspirate blood from the vein which previously was not possible.  I felt this was adequate to facilitate anti-coagulation assisted autolysis.  The sheath was aspirated.  No clots were present and the sheath was reloaded with heparinized saline.  The left brachial vein sheath will be removed in holding.   COMPLICATIONS: none  CONDITION: stable   Leonides SakeBrian Chen, MD, [facility] [place] Office: [phone] Pager: [phone]  01/31/2018, 2:10 PM

## 2018-01-31 NOTE — Consult Note (Signed)
[facility] [place] CONSULT NOTE   MRN : [id number]  Reason for Consult: left arm thrombophlebitis, DVT Referring Physician: Dr. Clyde LundborgNiu  History of Present Illness: 57 y/o female with 6 day history of sudden left arm edema with pain and raised superficial venous "cord".  She states she start taking a daily Aspirin 325 mg to see if this help her arm edema.  The pain and edema did not go away and she decided to report to the ED.  Left UE venous duplex revealed Subclavian vein thrombus extending into the innominate vein. nonocclusive, Basilic and Brachial vein non compressible.     Past medical history: hyperlipidemia, GERD, hypothyroidism, depression with anxiety, suicide attempt on 2/9, PUD, obesity, migraine headaches, fibromyalgia and > 1 year hx of SOB.  O 2 SAT 99% on RA.  She denise hx of DVT, A fib,  CAD, DM and COPD.  No fevers or chills.  No LE edema or pain.           Current Facility-Administered Medications  Medication Dose Route Frequency Provider Last Rate Last Dose  . ARIPiprazole (ABILIFY) tablet 2 mg  2 mg Oral Daily Lorretta HarpNiu, Xilin, MD      . ezetimibe (ZETIA) tablet 10 mg  10 mg Oral Daily Lorretta HarpNiu, Xilin, MD      . gabapentin (NEURONTIN) capsule 600 mg  600 mg Oral TID Lorretta HarpNiu, Xilin, MD   600 mg at 01/30/18 2235  . heparin ADULT infusion 100 units/mL (25000 units/26750mL sodium chloride 0.45%)  1,300 Units/hr Intravenous Continuous Baldemar FridayMasters, Alison M, [facility] 13 mL/hr at 01/31/18 [id number] 1,300 Units/hr at 01/31/18 [id number]  . hydrALAZINE (APRESOLINE) injection 5 mg  5 mg Intravenous Q2H PRN Lorretta HarpNiu, Xilin, MD      . hydrOXYzine (ATARAX/VISTARIL) tablet 50 mg  50 mg Oral TID PRN Lorretta HarpNiu, Xilin, MD      . levothyroxine (SYNTHROID, LEVOTHROID) tablet 25 mcg  25 mcg Oral QAC breakfast Lorretta HarpNiu, Xilin, MD      . LORazepam (ATIVAN) injection 1 mg  1 mg Intravenous Q2H PRN Lorretta HarpNiu, Xilin, MD      . mirtazapine (REMERON) tablet 15 mg  15 mg Oral QHS Lorretta HarpNiu, Xilin, MD   15 mg at 01/30/18 2235  . ondansetron  (ZOFRAN) tablet 4 mg  4 mg Oral Q6H PRN Lorretta HarpNiu, Xilin, MD       Or  . ondansetron [facility](ZOFRAN) injection 4 mg  4 mg Intravenous Q6H PRN Lorretta HarpNiu, Xilin, MD      . oxyCODONE (Oxy IR/ROXICODONE) immediate release tablet 10 mg  10 mg Oral Q6H PRN Lorretta HarpNiu, Xilin, MD   10 mg at 01/31/18 [id number]  . topiramate (TOPAMAX) tablet 100 mg  100 mg Oral BID Lorretta HarpNiu, Xilin, MD   100 mg at 01/30/18 2235  . venlafaxine XR (EFFEXOR-XR) 24 hr capsule 225 mg  225 mg Oral Q breakfast Lorretta HarpNiu, Xilin, MD      . zolpidem (AMBIEN) tablet 5 mg  5 mg Oral QHS PRN Lorretta HarpNiu, Xilin, MD   5 mg at 01/30/18 2235    Pt meds include: Statin :No Betablocker: No ASA: Yes for the past 6 days Other anticoagulants/antiplatelets: Currently on Heparin  Past Medical History:  Diagnosis Date  . Adjustment disorder with mixed anxiety and depressed mood 09/28/2009   Qualifier: Diagnosis of  By: Nena JordanYoo DO, D. Robert   . Allergic state 04/09/2013  . Anemia    thalassemia  . Anxiety    takes Ativan daily prn  . Arthritis  sees Dr.Beekman  . Bursitis of right shoulder   . Cholecystitis, acute 08/30/2013  . Chronic insomnia   . Complication of anesthesia    hard to sedate  . Constipation    takes Linzess daily prn  . Depression    takes Effexor daily  . Difficulty swallowing solids   . Fibromyalgia   . GERD (gastroesophageal reflux disease) 06/17/2013   takes Dexilant daily prn  . H. pylori infection 07/19/2013  . H/O hiatal hernia   . Headache(784.0) 05/22/2013  . Hereditary and idiopathic peripheral neuropathy 03/14/2015  . History of bronchitis    last time many yrs ago  . History of colonoscopy 2000  . Hyperlipidemia    not on any meds   . Hypoglycemia   . Hypothyroidism   . Lichen    Dr[facility]Fleisher at [facility]  . Migraines    takes Topamax daily-last migraine today  . Muscle spasms of head and/or neck    takes Norflex daily prn  . Nausea    takes Phenergan prn  . Obesity   . Peripheral edema    takes Lasix prn  . PONV (postoperative nausea and  vomiting)    with tonsil removal surgery  . PUD (peptic ulcer disease)    Dr Loreta Ave ( [facility])  . Seizure disorder (HCC) x 2   presumed secondary to ativan withdrawal -Dr Sharene Skeans  . Seizures (HCC)    X 2  . Shortness of breath    pt states when Fibromyalgia flares up  . Sinusitis, acute 04/09/2013   takes Zyrtec daily    Past Surgical History:  Procedure Laterality Date  . ABDOMINAL HYSTERECTOMY  2000   partial  . CHOLECYSTECTOMY N/A 09/16/2013   Procedure: LAPAROSCOPIC CHOLECYSTECTOMY;  Surgeon: Emelia Loron, MD;  Location: [facility] OR;  Service: General;  Laterality: N/A;  . COLONOSCOPY    . REMOVAL OF CERVICAL EXOSTOSIS N/A 06/08/2016   Procedure: REMOVAL OF  EXOSTOSIS ANTERIOR CERVICAL SPINE;  Surgeon: Venita Lick, MD;  Location: [facility] OR;  Service: Orthopedics;  Laterality: N/A;  . TONSILLECTOMY  1972    Social History Social History   Tobacco Use  . Smoking status: Never Smoker  . Smokeless tobacco: Never Used  Substance Use Topics  . Alcohol use: No    Frequency: Never  . Drug use: No    Comment: Rare    Family History Family History  Problem Relation Age of Onset  . Diabetes Mother        typer 2  . Heart disease Mother        PVD, PAD  . Cancer Father        prostate, lung, brain mets, smoker  . COPD Father   . Hyperlipidemia Father   . Hypertension Father   . Thyroid disease Father   . Hypertension Sister   . Thyroid disease Sister   . Arthritis Sister   . Diabetes Son 16       type 1  . Diabetes Maternal Grandmother   . Stroke Maternal Grandfather   . Cancer Paternal Grandmother 62       breast  . Alcohol abuse Paternal Grandfather   . Hypertension Sister   . Obesity Sister   . Psoriasis Son   . Cancer Unknown        Breast < 62 yo, ,Prostate <50 yo  . Coronary artery disease Unknown        <60 yo  . Diabetes Unknown     Allergies  Allergen Reactions  . Aspirin Other (See Comments)    Per Dr  . Augmentin [Amoxicillin-Pot Clavulanate]  Other (See Comments)    Blisters in the back of throat  . Dexilant [Dexlansoprazole] Other (See Comments)    Bowel urgency and liquid stools  . Hydrocodone-Acetaminophen Hives    REACTION: Rash  . Influenza Vaccines Swelling    Arm swelled to the size of a grapefruit  . Levsin [Hyoscyamine Sulfate] Other (See Comments)    Projectile vomitting  . Linzess [Linaclotide] Other (See Comments)    Bowel urgency and liquid stools  . Promethazine Nausea And Vomiting  . Propoxyphene N-Acetaminophen Itching    REACTION: rash  . Statins Other (See Comments)    myalgia  . Acetaminophen Itching and Rash  . Latex Itching and Rash    Rash-looks like she has the measles per pt  . Oxycodone-Acetaminophen Itching and Rash    REACTION: Rash     REVIEW OF SYSTEMS  General: [ ]  Weight loss, [ ]  Fever, [ ]  chills Neurologic: [ ]  Dizziness, [ ]  Blackouts, [ ]  Seizure [ ]  Stroke, [ ]  "Mini stroke", [ ]  Slurred speech, [ ]  Temporary blindness; [ ]  weakness in arms or legs, [ ]  Hoarseness [ ]  Dysphagia Cardiac: [ ]  Chest pain/pressure, [ ]  Shortness of breath at rest [x Shortness of breath with exertion, [ ]  Atrial fibrillation or irregular heartbeat  Vascular: [ ]  Pain in legs with walking, [ ]  Pain in legs at rest, [ ]  Pain in legs at night,  [ ]  Non-healing ulcer, [ ]  Blood clot in vein/DVT,   Pulmonary: [ ]  Home oxygen, [ ]  Productive cough, [ ]  Coughing up blood, [ ]  Asthma,  [ ]  Wheezing [ ]  COPD Musculoskeletal:  [ ]  Arthritis, [ ]  Low back pain, [x ] Joint pain Hematologic: [ ]  Easy Bruising, [ ]  Anemia; [ ]  Hepatitis Gastrointestinal: [ ]  Blood in stool, [ ]  Gastroesophageal Reflux/heartburn, Urinary: [ ]  chronic Kidney disease, [ ]  on HD - [ ]  MWF or [ ]  TTHS, [ ]  Burning with urination, [ ]  Difficulty urinating Skin: [ ]  Rashes, [ ]  Wounds [x]  left UE cellulitis Psychological: [x ] Anxiety, [x ] Depression  Physical Examination Vitals:   01/30/18 1729 01/30/18 2014 01/31/18 0142 01/31/18  0522  BP: 139/90 (!) 149/93  139/76  Pulse: 77 72  76  Resp: 18 14  15   Temp: 97.9 F (36.6 C) (!) 96.4 F (35.8 C) 98.2 F (36.8 C) 98.7 F (37.1 C)  TempSrc: Oral Oral Oral Oral  SpO2: 99% 99%  98%  Weight:      Height:       Body mass index is 35.88 kg/m.  General:  WDWN in NAD HENT: WNL Eyes: Pupils equal Pulmonary: normal non-labored breathing , without Rales, rhonchi,  wheezing Cardiac: RRR, without  Murmurs, rubs or gallops; No carotid bruits Abdomen: soft, NT, no masses Skin: no rashes, ulcers noted;  no Gangrene , positive left antecubital area cellulitis; no open wounds;   Vascular Exam/Pulses:radial, femoral, DP/PT pulses B    Musculoskeletal: no muscle wasting or atrophy; positive left UE edema with thrombophlebitis "cord left antecubital area. Neurologic: A&O X 3; Appropriate Affect ;  SENSATION: normal; MOTOR FUNCTION: 5/5 Symmetric, left UE decreased secondary to edema and pain.   Speech is fluent/normal   Significant Diagnostic Studies: CBC Lab Results  Component Value Date   WBC 5.8 01/31/2018   HGB 11.4 (L) 01/31/2018   HCT  36.1 01/31/2018   MCV 70.1 (L) 01/31/2018   PLT 212 01/31/2018    BMET    Component Value Date/Time   NA 142 01/31/2018 0314   NA 143 06/09/2013 1118   K 3.3 (L) 01/31/2018 0314   K 3.7 06/09/2013 1118   CL 111 01/31/2018 0314   CL 108 06/09/2013 1118   CO2 22 01/31/2018 0314   CO2 27 06/09/2013 1118   GLUCOSE 106 (H) 01/31/2018 0314   GLUCOSE 97 06/09/2013 1118   BUN 8 01/31/2018 0314   BUN 12 06/09/2013 1118   CREATININE 1.07 (H) 01/31/2018 0314   CREATININE 1.09 03/09/2014 0745   CALCIUM 8.5 (L) 01/31/2018 0314   CALCIUM 9.6 06/09/2013 1118   GFRNONAA 56 (L) 01/31/2018 0314   GFRAA >60 01/31/2018 0314   Estimated Creatinine Clearance: 73.4 mL/min (A) (by C-G formula based on SCr of 1.07 mg/dL (H)).  COAG Lab Results  Component Value Date   INR 0.91 01/30/2018     Non-Invasive Vascular Imaging:   IMPRESSION: 1. Positive for deep venous thrombosis involving the paired brachialveins in the subclavian vein with thrombus extending into the innominate vein. The brachial venous DVT is occlusive while thesubclavian and innominate DVT is nonocclusive. 2. Positive for superficial venous thrombosis involving the left basilic vein.    ASSESSMENT/PLAN:  Left UE thrombophlebitis.    Brachial venous DVT is occlusive while thesubclavian and innominate DVT is nonocclusive. Currently stared on Heparin IV and the patient states her arm already is feeling better and decreased in size.   NPO plan for catheterization and administration of TPA.    Mosetta Pigeon 01/31/2018 8:44 AM

## 2018-01-31 NOTE — Plan of Care (Signed)
  Progressing Health Behavior/Discharge Planning: Ability to manage health-related needs will improve 01/31/2018 2352 - Progressing by Peggye Pitt, RN Skin Integrity: Risk for impaired skin integrity will decrease 01/31/2018 2352 - Progressing by Peggye Pitt, RN   Adequate for Discharge Nutrition: Adequate nutrition will be maintained 01/31/2018 2352 - Adequate for Discharge by Peggye Pitt, RN   Completed/Met Education: Knowledge of General Education information will improve 01/31/2018 2352 - Completed/Met by Peggye Pitt, RN

## 2018-01-31 NOTE — Progress Notes (Signed)
PROGRESS NOTE [facility]   [name]   ZOX:[id number] DOB: 1961-11-01  DOA: 01/30/2018 PCP: Jearld LeschWilliams, Dwight M, MD   Brief Narrative:  [name] is a 57 year old female with medical history significant for hyperlipidemia, GERD, hypothyroidism, depression and anxiety who presented to the emergency department complaining of left upper arm pain and swelling.  Upon ED evaluation patient was found to have left upper arm swelling venous Doppler showed deep venous thrombosis involving the brachial veins in the subclavians with a thrombus extending.  Also positive for superficial venous thrombosis involving the left basilic vein.  Vascular surgery was consulted and recommended admission for possible TPA.  Subjective: Patient seen and examined, complaining of left arm pain which has improved since last night.  Swelling has diminished as well.  No other complaints.  Assessment & Plan: Acute deep vein thrombosis of the left upper extremity Vascular surgery consulted - patient will receive TPA today  Unclear etiology at this point.  Been treated with heparin, ? If will need long term a/c  Continue management per vascular surgery  She was given clindamycin - no signs of infection no need for further abx   Hx of seizures  Asymptomatic  Continue Topomax   HLD  Continue Zocor   Depression and Anxiety  Continue home medications   DVT prophylaxis: Heparin ggt  Code Status: Full Code  Family Communication: None at bedside  Disposition Plan: Home when medically clear   Consultants:   Vascular surgery   Procedures:   TPA   Antimicrobials: Anti-infectives (From admission, onward)   Start     Dose/Rate Route Frequency Ordered Stop   01/30/18 1515  clindamycin (CLEOCIN) IVPB 900 mg     900 mg 100 mL/hr over 30 Minutes Intravenous  Once 01/30/18 1510 01/30/18 1608           Objective: Vitals:   01/30/18 1729 01/30/18 2014 01/31/18 0142 01/31/18 0522  BP:  139/90 (!) 149/93  139/76  Pulse: 77 72  76  Resp: 18 14  15   Temp: 97.9 F (36.6 C) (!) 96.4 F (35.8 C) 98.2 F (36.8 C) 98.7 F (37.1 C)  TempSrc: Oral Oral Oral Oral  SpO2: 99% 99%  98%  Weight:      Height:        Intake/Output Summary (Last 24 hours) at 01/31/2018 1413 Last data filed at 01/31/2018 0900 Gross per 24 hour  Intake 50 ml  Output -  Net 50 ml   Filed Weights   01/30/18 1056  Weight: 107 kg (236 lb)    Examination:  General exam: NAD  HEENT: OP moist and clear Respiratory system: Clear to auscultation. No wheezes,crackle or rhonchi Cardiovascular system: S1/S2 heard, RRR. No JVD, murmurs, rubs or gallops Gastrointestinal system: Abdomen is nondistended, soft and nontender.  Central nervous system: Alert and oriented.  Extremities: Mild erythema, swelling on L upper arm. Tender to palpation   Skin: No rashes Psychiatry:  Mood & affect appropriate.    Data Reviewed: I have personally reviewed following labs and imaging studies  CBC: Recent Labs  Lab 01/30/18 1248 01/30/18 2249 01/31/18 0314  WBC 6.0 6.4 5.8  NEUTROABS 3.5  --   --   HGB 12.1 12.3 11.4*  HCT 37.9 39.0 36.1  MCV 69.2* 70.4* 70.1*  PLT 242 212 212   Basic Metabolic Panel: Recent Labs  Lab 01/30/18 1248 01/31/18 0314  NA 143 142  K 3.4* 3.3*  CL 109 111  CO2 26  22  GLUCOSE 105* 106*  BUN 13 8  CREATININE 1.14* 1.07*  CALCIUM 8.9 8.5*   GFR: Estimated Creatinine Clearance: 73.4 mL/min (A) (by C-G formula based on SCr of 1.07 mg/dL (H)). Liver Function Tests: No results for input(s): AST, ALT, ALKPHOS, BILITOT, PROT, ALBUMIN in the last 168 hours. No results for input(s): LIPASE, AMYLASE in the last 168 hours. No results for input(s): AMMONIA in the last 168 hours. Coagulation Profile: Recent Labs  Lab 01/30/18 1248  INR 0.91   Cardiac Enzymes: Recent Labs  Lab 01/30/18 1248  TROPONINI <0.03   BNP (last 3 results) No results for input(s): PROBNP in the  last 8760 hours. HbA1C: No results for input(s): HGBA1C in the last 72 hours. CBG: Recent Labs  Lab 01/31/18 0834  GLUCAP 80   Lipid Profile: No results for input(s): CHOL, HDL, LDLCALC, TRIG, CHOLHDL, LDLDIRECT in the last 72 hours. Thyroid Function Tests: No results for input(s): TSH, T4TOTAL, FREET4, T3FREE, THYROIDAB in the last 72 hours. Anemia Panel: No results for input(s): VITAMINB12, FOLATE, FERRITIN, TIBC, IRON, RETICCTPCT in the last 72 hours. Sepsis Labs: No results for input(s): PROCALCITON, LATICACIDVEN in the last 168 hours.  No results found for this or any previous visit (from the past 240 hour(s)).    Radiology Studies: Ct Angio Chest Pe W And/or Wo Contrast  Result Date: 01/30/2018 CLINICAL DATA:  Suspect pulmonary embolus. History of blood clot and left arm. EXAM: CT ANGIOGRAPHY CHEST WITH CONTRAST TECHNIQUE: Multidetector CT imaging of the chest was performed using the standard protocol during bolus administration of intravenous contrast. Multiplanar CT image reconstructions and MIPs were obtained to evaluate the vascular anatomy. CONTRAST:  ISOVUE-370 IOPAMIDOL (ISOVUE-370) INJECTION 76% COMPARISON:  None. FINDINGS: Cardiovascular: The heart size appears within normal limits. No pericardial effusion visualized. Aortic atherosclerosis identified. The main pulmonary artery is patent. No saddle embolus or central obstructing emboli identified. No lobar or segmental pulmonary emboli identified. Mediastinum/Nodes: Normal appearance of the thyroid gland. The trachea appears patent and is midline. Normal appearance of the esophagus. No enlarged mediastinal or hilar lymph nodes. Lungs/Pleura: No pleural effusion. No pneumothorax, airspace consolidation or atelectasis. Pulmonary nodule in the right upper lobe measures 3 mm, image 41 of series 5. Upper Abdomen: Cysts noted within left lobe of liver. No acute abnormality identified within the upper abdomen. Musculoskeletal:  There is degenerative disc disease identified within the thoracic spine. No aggressive lytic or sclerotic bone lesions identified. Review of the MIP images confirms the above findings. IMPRESSION: 1. No evidence for acute pulmonary embolus. No active cardiopulmonary abnormalities noted. 2.  Aortic Atherosclerosis (ICD10-I70.0). 3. Right upper lobe pulmonary nodule measures 3 mm. No follow-up needed if patient is low-risk. Non-contrast chest CT can be considered in 12 months if patient is high-risk. This recommendation follows the consensus statement: Guidelines for Management of Incidental Pulmonary Nodules Detected on CT Images: From the Fleischner Society 2017; Radiology 2017; 284:228-243. Electronically Signed   By: Signa Kell M.D.   On: 01/30/2018 14:01   US Venous Img Upper Uni Left  Result Date: 01/30/2018 CLINICAL DATA:  57 year old female with left upper extremity pain and swelling EXAM: LEFT UPPER EXTREMITY VENOUS DOPPLER ULTRASOUND TECHNIQUE: Gray-scale sonography with graded compression, as well as color Doppler and duplex ultrasound were performed to evaluate the upper extremity deep venous system from the level of the subclavian vein and including the jugular, axillary, basilic, radial, ulnar and upper cephalic vein. Spectral Doppler was utilized to evaluate flow at rest  and with distal augmentation maneuvers. COMPARISON:  None. FINDINGS: Contralateral Subclavian Vein: Respiratory phasicity is normal and symmetric with the symptomatic side. No evidence of thrombus. Normal compressibility. Internal Jugular Vein: No evidence of thrombus. Normal compressibility, respiratory phasicity and response to augmentation. Subclavian Vein: Echogenic material present within the left innominate vein extending into the left subclavian vein. There is evidence of some flow on color Doppler imaging suggesting that this thrombus is nonocclusive. Axillary Vein: No evidence of thrombus. Normal compressibility,  respiratory phasicity and response to augmentation. Cephalic Vein: No evidence of thrombus. Normal compressibility, respiratory phasicity and response to augmentation. Basilic Vein: The basilic vein is not compressible. The lumen is filled with low-level internal echoes. No evidence of flow on color Doppler imaging. Brachial Veins: Noncompressible paired brachial veins. No evidence of flow on color Doppler imaging. Radial Veins: No evidence of thrombus. Normal compressibility, respiratory phasicity and response to augmentation. Ulnar Veins: No evidence of thrombus. Normal compressibility, respiratory phasicity and response to augmentation. Venous Reflux:  None visualized. Other Findings:  None visualized. IMPRESSION: 1. Positive for deep venous thrombosis involving the paired brachial veins in the subclavian vein with thrombus extending into the innominate vein. The brachial venous DVT is occlusive while the subclavian and innominate DVT is nonocclusive. 2. Positive for superficial venous thrombosis involving the left basilic vein. Signed, Sterling Big, MD [facility] Radiology Electronically Signed   By: Malachy Moan M.D.   On: 01/30/2018 12:58      Scheduled Meds: . [MAR Hold] ARIPiprazole  2 mg Oral Daily  . [MAR Hold] ezetimibe  10 mg Oral Daily  . [MAR Hold] gabapentin  600 mg Oral TID  . [MAR Hold] levothyroxine  25 mcg Oral QAC breakfast  . [MAR Hold] mirtazapine  15 mg Oral QHS  . [MAR Hold] topiramate  100 mg Oral BID  . [MAR Hold] venlafaxine XR  225 mg Oral Q breakfast   Continuous Infusions: . sodium chloride    . heparin 1,300 Units/hr (01/31/18 0624)  . heparin       LOS: 0 days    Time spent: Total of 25 minutes spent with pt, greater than 50% of which was spent in discussion of  treatment, counseling and coordination of care   Latrelle Dodrill, MD Pager: Text Page via [email or web address]   If 7PM-7AM, please contact  night-coverage [email or web address] 01/31/2018, 2:13 PM   Note - This record has been created using [employer]. Chart creation errors have been sought, but may not always have been located. Such creation errors do not reflect on the standard of medical care.

## 2018-02-01 ENCOUNTER — Inpatient Hospital Stay (HOSPITAL_COMMUNITY): Payer: Medicaid Other

## 2018-02-01 ENCOUNTER — Encounter (HOSPITAL_COMMUNITY): Payer: Self-pay | Admitting: Vascular Surgery

## 2018-02-01 ENCOUNTER — Encounter (HOSPITAL_COMMUNITY)
Admission: EM | Disposition: A | Discharge status: Home Health | Payer: Self-pay | Source: Home / Self Care | Attending: Family Medicine

## 2018-02-01 DIAGNOSIS — R1313 Dysphagia, pharyngeal phase: Secondary | ICD-10-CM

## 2018-02-01 LAB — BASIC METABOLIC PANEL
ANION GAP: 10 (ref 5–15)
BUN: 14 mg/dL (ref 6–20)
CALCIUM: 8.6 mg/dL — AB (ref 8.9–10.3)
CO2: 22 mmol/L (ref 22–32)
Chloride: 110 mmol/L (ref 101–111)
Creatinine, Ser: 2.98 mg/dL — ABNORMAL HIGH (ref 0.44–1.00)
GFR, EST AFRICAN AMERICAN: 19 mL/min — AB (ref >60)
GFR, EST NON AFRICAN AMERICAN: 16 mL/min — AB (ref >60)
Glucose, Bld: 105 mg/dL — ABNORMAL HIGH (ref 65–99)
Potassium: 3.8 mmol/L (ref 3.5–5.1)
SODIUM: 142 mmol/L (ref 135–145)

## 2018-02-01 LAB — CBC
HCT: 33.4 % — ABNORMAL LOW (ref 36.0–46.0)
Hemoglobin: 10.4 g/dL — ABNORMAL LOW (ref 12.0–15.0)
MCH: 21.8 pg — AB (ref 26.0–34.0)
MCHC: 31.1 g/dL (ref 30.0–36.0)
MCV: 70.2 fL — ABNORMAL LOW (ref 78.0–100.0)
PLATELETS: 196 10*3/uL (ref 150–400)
RBC: 4.76 MIL/uL (ref 3.87–5.11)
RDW: 16.2 % — AB (ref 11.5–15.5)
WBC: 6.1 10*3/uL (ref 4.0–10.5)

## 2018-02-01 LAB — HEPARIN LEVEL (UNFRACTIONATED)
HEPARIN UNFRACTIONATED: 1.46 [IU]/mL — AB (ref 0.30–0.70)
HEPARIN UNFRACTIONATED: 0.86 [IU]/mL — AB (ref 0.30–0.70)

## 2018-02-01 LAB — GLUCOSE, CAPILLARY: GLUCOSE-CAPILLARY: 96 mg/dL (ref 65–99)

## 2018-02-01 LAB — FIBRINOGEN: Fibrinogen: 530 mg/dL — ABNORMAL HIGH (ref 210–475)

## 2018-02-01 SURGERY — PERIPHERAL VASCULAR THROMBECTOMY
Anesthesia: LOCAL | Laterality: Left

## 2018-02-01 MED ORDER — AMLODIPINE BESYLATE 5 MG PO TABS
5.0000 mg | ORAL_TABLET | Freq: Every day | ORAL | Status: DC
  Administered 2018-02-01 – 2018-02-19 (×19): 5 mg via ORAL
  Filled 2018-02-01 (×19): qty 1

## 2018-02-01 MED ORDER — SODIUM CHLORIDE 0.9 % IV SOLN
INTRAVENOUS | Status: DC
  Administered 2018-02-01 – 2018-02-06 (×9): via INTRAVENOUS

## 2018-02-01 MED ORDER — HEPARIN (PORCINE) IN NACL 100-0.45 UNIT/ML-% IJ SOLN
900.0000 [IU]/h | INTRAMUSCULAR | Status: DC
  Administered 2018-02-01: 1100 [IU]/h via INTRAVENOUS
  Administered 2018-02-01: 1000 [IU]/h via INTRAVENOUS
  Administered 2018-02-04 (×2): 1050 [IU]/h via INTRAVENOUS
  Administered 2018-02-05 – 2018-02-06 (×2): 1250 [IU]/h via INTRAVENOUS
  Administered 2018-02-08: 950 [IU]/h via INTRAVENOUS
  Administered 2018-02-09 – 2018-02-11 (×4): 1100 [IU]/h via INTRAVENOUS
  Administered 2018-02-12 – 2018-02-13 (×2): 1000 [IU]/h via INTRAVENOUS
  Filled 2018-02-01 (×16): qty 250

## 2018-02-01 MED ORDER — GABAPENTIN 300 MG PO CAPS
300.0000 mg | ORAL_CAPSULE | Freq: Three times a day (TID) | ORAL | Status: DC
  Administered 2018-02-01 – 2018-02-02 (×3): 300 mg via ORAL
  Filled 2018-02-01 (×3): qty 1

## 2018-02-01 MED FILL — Heparin Sodium (Porcine) 2 Unit/ML in Sodium Chloride 0.9%: INTRAMUSCULAR | Qty: 500 | Status: AC

## 2018-02-01 MED FILL — Lidocaine HCl Local Inj 1%: INTRAMUSCULAR | Qty: 20 | Status: AC

## 2018-02-01 NOTE — Progress Notes (Signed)
PROGRESS NOTE [facility]   Heather Hanson   WNU:[id number] DOB: [birth year]  DOA: 01/30/2018 PCP: Macy Mis, MD   Brief Narrative:  Heather Hanson is a 57 year old female with medical history significant for hyperlipidemia, GERD, hypothyroidism, depression and anxiety who presented to the emergency department complaining of left upper arm pain and swelling.  Upon ED evaluation patient was found to have left upper arm swelling venous Doppler showed deep venous thrombosis involving the brachial veins in the subclavians with a thrombus extending.  Also positive for superficial venous thrombosis involving the left basilic vein.  Vascular surgery was consulted and recommended admission for possible TPA.  Subjective: -Reports ongoing problems with dysphagia, odynophagia, started years ago but worsening for the last few months  Assessment & Plan:  Acute symptomatic central deep vein thrombosis of left arm -Vascular surgery consulted , and patient underwent TPA and thrombectomy 2/21 -Continue IV heparin, transitioned to oral anticoagulation in 24-48 hours if okay with VVVS -Risk factor for DVT unclear at this time -She has a history of thalassemia which which could potentially increase viscosity of blood  Acute on chronic dysphagia  -Worse with cold liquids  -Has symptoms which sound like esophageal spasm  -Trial of amlodipine, check barium esophagram   Acute kidney injury -Likely secondary to AngioJet thrombolysis, venogram -Aggressive IV fluids today, monitor kidney function closely  Hx of seizures  Asymptomatic  Continue Topomax   HLD  Continue Zocor   Depression and Anxiety  Continue home medications   DVT prophylaxis: Heparin ggt  Code Status: Full Code  Family Communication: None at bedside  Disposition Plan: Home pending improvement in kidney function, overall stability of thrombus  Consultants:   Vascular surgery   Procedures:   TPA    Antimicrobials: Anti-infectives (From admission, onward)   Start     Dose/Rate Route Frequency Ordered Stop   01/30/18 1515  clindamycin (CLEOCIN) IVPB 900 mg     900 mg 100 mL/hr over 30 Minutes Intravenous  Once 01/30/18 1510 01/30/18 1608          Objective: Vitals:   01/31/18 1813 01/31/18 2008 02/01/18 0523 02/01/18 1331  BP: (!) 161/98 (!) 171/111 (!) 164/87 (!) 141/70  Pulse:  74 77 90  Resp:  13 17 18   Temp: 98 F (36.7 C) 98.3 F (36.8 C) 98.5 F (36.9 C) 98.1 F (36.7 C)  TempSrc: Oral Oral Oral Oral  SpO2:  97% 96% 100%  Weight: 105.5 kg (232 lb 8 oz)     Height: 5\' 9"  (1.753 m)       Intake/Output Summary (Last 24 hours) at 02/01/2018 1341 Last data filed at 02/01/2018 1300 Gross per 24 hour  Intake 1478.67 ml  Output -  Net 1478.67 ml   Filed Weights   01/30/18 1056 01/31/18 1813  Weight: 107 kg (236 lb) 105.5 kg (232 lb 8 oz)    Examination: Gen: Awake, Alert, Oriented X 3, no distress HEENT: Oral mucosa moist and pink Lungs: Good air movement bilaterally, CTAB CVS: S1-S2/regular rate rhythm Abd: soft, Non tender, non distended, BS present Extremities: Mild left upper extremity edema Skin: no new rashes Psychiatry:  Mood & affect appropriate.    Data Reviewed: I have personally reviewed following labs and imaging studies  CBC: Recent Labs  Lab 01/30/18 1248 01/30/18 2249 01/31/18 0314 02/01/18 0436  WBC 6.0 6.4 5.8 6.1  NEUTROABS 3.5  --   --   --   HGB 12.1 12.3 11.4* 10.4*  HCT 37.9 39.0 36.1 33.4*  MCV 69.2* 70.4* 70.1* 70.2*  PLT 242 212 212 196   Basic Metabolic Panel: Recent Labs  Lab 01/30/18 1248 01/31/18 0314 02/01/18 0436  NA 143 142 142  K 3.4* 3.3* 3.8  CL 109 111 110  CO2 26 22 22   GLUCOSE 105* 106* 105*  BUN 13 8 14   CREATININE 1.14* 1.07* 2.98*  CALCIUM 8.9 8.5* 8.6*   GFR: Estimated Creatinine Clearance: 26.6 mL/min (A) (by C-G formula based on SCr of 2.98 mg/dL (H)). Liver Function Tests: No results  for input(s): AST, ALT, ALKPHOS, BILITOT, PROT, ALBUMIN in the last 168 hours. No results for input(s): LIPASE, AMYLASE in the last 168 hours. No results for input(s): AMMONIA in the last 168 hours. Coagulation Profile: Recent Labs  Lab 01/30/18 1248  INR 0.91   Cardiac Enzymes: Recent Labs  Lab 01/30/18 1248  TROPONINI <0.03   BNP (last 3 results) No results for input(s): PROBNP in the last 8760 hours. HbA1C: No results for input(s): HGBA1C in the last 72 hours. CBG: Recent Labs  Lab 01/31/18 0834 02/01/18 0557  GLUCAP 80 96   Lipid Profile: No results for input(s): CHOL, HDL, LDLCALC, TRIG, CHOLHDL, LDLDIRECT in the last 72 hours. Thyroid Function Tests: No results for input(s): TSH, T4TOTAL, FREET4, T3FREE, THYROIDAB in the last 72 hours. Anemia Panel: No results for input(s): VITAMINB12, FOLATE, FERRITIN, TIBC, IRON, RETICCTPCT in the last 72 hours. Sepsis Labs: No results for input(s): PROCALCITON, LATICACIDVEN in the last 168 hours.  Recent Results (from the past 240 hour(s))  Blood culture (routine x 2)     Status: None (Preliminary result)   Collection Time: 01/30/18  3:25 PM  Result Value Ref Range Status   Specimen Description   Final    BLOOD RIGHT ANTECUBITAL Performed at [facility] [place], [address] [place], [place] [address]    Special Requests   Final    BOTTLES DRAWN AEROBIC AND ANAEROBIC Blood Culture adequate volume Performed at [facility] [place], [address] [place], [place] [address]    Culture   Final    NO GROWTH < 24 HOURS Performed at [facility], [address] [place], [place] [address]    Report Status PENDING  Incomplete  Blood culture (routine x 2)     Status: None (Preliminary result)   Collection Time: 01/30/18  3:30 PM  Result Value Ref Range Status   Specimen Description   Final    BLOOD BLOOD RIGHT HAND Performed at [facility] [place], [address] [place], [place] [address]    Special  Requests   Final    IN PEDIATRIC BOTTLE Blood Culture adequate volume Performed at [facility] [place], [address] [place], [place] [address]    Culture   Final    NO GROWTH < 24 HOURS Performed at [facility], [address] [place], [place] [address]    Report Status PENDING  Incomplete      Radiology Studies: Ct Angio Chest Pe W And/or Wo Contrast  Result Date: 01/30/2018 CLINICAL DATA:  Suspect pulmonary embolus. History of blood clot and left arm. EXAM: CT ANGIOGRAPHY CHEST WITH CONTRAST TECHNIQUE: Multidetector CT imaging of the chest was performed using the standard protocol during bolus administration of intravenous contrast. Multiplanar CT image reconstructions and MIPs were obtained to evaluate the vascular anatomy. CONTRAST:  100mL ISOVUE-370 IOPAMIDOL (ISOVUE-370) INJECTION 76% COMPARISON:  None.  FINDINGS: Cardiovascular: The heart size appears within normal limits. No pericardial effusion visualized. Aortic atherosclerosis identified. The main pulmonary artery is patent. No saddle embolus or central obstructing emboli identified. No lobar or segmental pulmonary emboli identified. Mediastinum/Nodes: Normal appearance of the thyroid gland. The trachea appears patent and is midline. Normal appearance of the esophagus. No enlarged mediastinal or hilar lymph nodes. Lungs/Pleura: No pleural effusion. No pneumothorax, airspace consolidation or atelectasis. Pulmonary nodule in the right upper lobe measures 3 mm, image 41 of series 5. Upper Abdomen: Cysts noted within left lobe of liver. No acute abnormality identified within the upper abdomen. Musculoskeletal: There is degenerative disc disease identified within the thoracic spine. No aggressive lytic or sclerotic bone lesions identified. Review of the MIP images confirms the above findings. IMPRESSION: 1. No evidence for acute pulmonary embolus. No active cardiopulmonary abnormalities noted. 2.  Aortic Atherosclerosis  (ICD10-I70.0). 3. Right upper lobe pulmonary nodule measures 3 mm. No follow-up needed if patient is low-risk. Non-contrast chest CT can be considered in 12 months if patient is high-risk. This recommendation follows the consensus statement: Guidelines for Management of Incidental Pulmonary Nodules Detected on CT Images: From the Fleischner Society 2017; Radiology 2017; 284:228-243. Electronically Signed   By: Signa Kell M.D.   On: 01/30/2018 14:01   Dg Esophagus  Result Date: 02/01/2018 CLINICAL DATA:  Esophageal dysphagia.  Food getting stuck. EXAM: ESOPHOGRAM/BARIUM SWALLOW TECHNIQUE: Single contrast examination was performed using  thin barium. FLUOROSCOPY TIME:  Fluoroscopy Time:  2 minutes and 30 seconds Radiation Exposure Index (if provided by the fluoroscopic device): 77.9 mGy Number of Acquired Spot Images: 0 COMPARISON:  Chest CT 01/30/2018 FINDINGS: Initial barium swallows demonstrate normal pharyngeal motion with swallowing. No laryngeal penetration or aspiration. No upper esophageal webs, strictures or diverticuli. Mild impression on the upper esophagus by the thoracic aortic arch. No hiatal hernia was demonstrated. Distally there is a smooth strictured narrowing. No obvious mass. 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 The 13 mm barium pill would not pass through this area. No reflux was demonstrated. 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 The 13 mm barium pill would not pass through this area. Electronically Signed   By: Rudie Meyer M.D.   On: 02/01/2018 10:53      Scheduled Meds: . amLODipine  5 mg Oral Daily  . ARIPiprazole  2 mg Oral Daily  . ezetimibe  10 mg Oral Daily  . famotidine  20 mg Oral BID  . gabapentin  600 mg Oral TID  . levothyroxine  25 mcg Oral QAC breakfast  . mirtazapine  15 mg Oral QHS  . sodium chloride flush  3 mL Intravenous Q12H  . topiramate  100 mg Oral BID  . venlafaxine XR  225 mg Oral Q breakfast    Continuous Infusions: . sodium chloride    . sodium chloride 125 mL/hr at 02/01/18 1254  . heparin 1,100 Units/hr (02/01/18 0814)     LOS: 1 day    Time spent:  Zannie Cove, MD Pager: Text Page via [email or web address]   If 7PM-7AM, please contact night-coverage [email or web address] 02/01/2018, 1:41 PM   Note - This record has been created using [employer]. Chart creation errors have been sought, but may not always have been located. Such creation errors do not reflect on the standard of medical care.

## 2018-02-01 NOTE — Progress Notes (Signed)
    Subjective  - POD #1, s/p thrombolysis of left SCV thrombosis  Feels her arm is better and less swolen   Physical Exam:  Palpable radial pulse in left arm] Decreased edema to left UE       Assessment/Plan:  POD #1  Acute renal failure secondary to angiojet thrombolysis, will need aggressive IV hydration  Left upper arm thrombosis:  Etiology unclear.  Patient concerned about retained hardware from neck surgery.  I will get a neck and chest XRAY today.  Will need either MRV or CTV once creatinine improves  Wells Lucilla Petrenko 02/01/2018 12:11 PM --  Vitals:   01/31/18 2008 02/01/18 0523  BP: (!) 171/111 (!) 164/87  Pulse: 74 77  Resp: 13 17  Temp: 98.3 F (36.8 C) 98.5 F (36.9 C)  SpO2: 97% 96%    Intake/Output Summary (Last 24 hours) at 02/01/2018 1211 Last data filed at 02/01/2018 0816 Gross per 24 hour  Intake 1238.67 ml  Output -  Net 1238.67 ml     Laboratory CBC    Component Value Date/Time   WBC 6.1 02/01/2018 0436   HGB 10.4 (L) 02/01/2018 0436   HGB 11.7 07/06/2014 1504   HGB 11.7 07/01/2009 0910   HCT 33.4 (L) 02/01/2018 0436   HCT 36.8 07/06/2014 1504   HCT 36.4 07/01/2009 0910   PLT 196 02/01/2018 0436   PLT 204 07/06/2014 1504   PLT 190 07/01/2009 0910    BMET    Component Value Date/Time   NA 142 02/01/2018 0436   NA 143 06/09/2013 1118   K 3.8 02/01/2018 0436   K 3.7 06/09/2013 1118   CL 110 02/01/2018 0436   CL 108 06/09/2013 1118   CO2 22 02/01/2018 0436   CO2 27 06/09/2013 1118   GLUCOSE 105 (H) 02/01/2018 0436   GLUCOSE 97 06/09/2013 1118   BUN 14 02/01/2018 0436   BUN 12 06/09/2013 1118   CREATININE 2.98 (H) 02/01/2018 0436   CREATININE 1.09 03/09/2014 0745   CALCIUM 8.6 (L) 02/01/2018 0436   CALCIUM 9.6 06/09/2013 1118   GFRNONAA 16 (L) 02/01/2018 0436   GFRAA 19 (L) 02/01/2018 0436    COAG Lab Results  Component Value Date   INR 0.91 01/30/2018   No results found for: PTT  Antibiotics Anti-infectives (From  admission, onward)   Start     Dose/Rate Route Frequency Ordered Stop   01/30/18 1515  clindamycin (CLEOCIN) IVPB 900 mg     900 mg 100 mL/hr over 30 Minutes Intravenous  Once 01/30/18 1510 01/30/18 1608       V. Charlena CrossWells Jonquil Stubbe IV, M.D. [facility] [place] Office: [phone] Pager:  [phone]

## 2018-02-01 NOTE — Progress Notes (Signed)
ANTICOAGULATION CONSULT NOTE - Follow Up Consult  Pharmacy Consult for Heparin Indication: DVT  Patient Measurements: Height: 5\' 9"  (175.3 cm) Weight: 232 lb 8 oz (105.5 kg) IBW/kg (Calculated) : 66.2 Heparin Dosing Weight: 88 kg  Vital Signs: Temp: 98.1 F (36.7 C) (02/22 1331) Temp Source: Oral (02/22 1331) BP: 141/70 (02/22 1331) Pulse Rate: 90 (02/22 1331)  Labs: Recent Labs    01/30/18 1248  01/30/18 2249 01/31/18 0314  01/31/18 2219 02/01/18 0436 02/01/18 1552  HGB 12.1  --  12.3 11.4*  --   --  10.4*  --   HCT 37.9  --  39.0 36.1  --   --  33.4*  --   PLT 242  --  212 212  --   --  196  --   LABPROT 12.2  --   --   --   --   --   --   --   INR 0.91  --   --   --   --   --   --   --   HEPARINUNFRC  --    < > 1.92* 1.86*   < > <0.10* 1.46* 0.86*  CREATININE 1.14*  --   --  1.07*  --   --  2.98*  --   TROPONINI <0.03  --   --   --   --   --   --   --    < > = values in this interval not displayed.    Estimated Creatinine Clearance: 26.6 mL/min (A) (by C-G formula based on SCr of 2.98 mg/dL (H)).  Assessment: [age] YOF presented with acute DVT on 2/20 and started on IV heparin. S/p Angiojet thrombectomy with alteplase on 2/21.  Heparin levels have been elevated- this evening's level is also above goal at 0.86 units/mL. No bleeding noted.   Goal of Therapy:  Heparin level 0.3-0.7 units/ml Monitor platelets by anticoagulation protocol: Yes   Plan:  Decrease heparin to 1000 units/hr Heparin level in 8 hours Daily heparin level and CBCB Follow long term anticoagulation plans and renal function (SCr up to 2.98 today which is more than doubled from previous value)  Adrik Khim D. Wallace Gappa, PharmD, BCPS Clinical Pharmacist [phone] 02/01/2018 5:22 PM

## 2018-02-02 ENCOUNTER — Inpatient Hospital Stay (HOSPITAL_COMMUNITY): Payer: Medicaid Other

## 2018-02-02 DIAGNOSIS — R1310 Dysphagia, unspecified: Secondary | ICD-10-CM

## 2018-02-02 LAB — BASIC METABOLIC PANEL
Anion gap: 8 (ref 5–15)
BUN: 20 mg/dL (ref 6–20)
CO2: 20 mmol/L — ABNORMAL LOW (ref 22–32)
CREATININE: 5.25 mg/dL — AB (ref 0.44–1.00)
Calcium: 7.9 mg/dL — ABNORMAL LOW (ref 8.9–10.3)
Chloride: 112 mmol/L — ABNORMAL HIGH (ref 101–111)
GFR calc Af Amer: 10 mL/min — ABNORMAL LOW (ref >60)
GFR, EST NON AFRICAN AMERICAN: 8 mL/min — AB (ref >60)
Glucose, Bld: 100 mg/dL — ABNORMAL HIGH (ref 65–99)
POTASSIUM: 3.7 mmol/L (ref 3.5–5.1)
SODIUM: 140 mmol/L (ref 135–145)

## 2018-02-02 LAB — HEPARIN LEVEL (UNFRACTIONATED)
HEPARIN UNFRACTIONATED: 0.81 [IU]/mL — AB (ref 0.30–0.70)
HEPARIN UNFRACTIONATED: 0.48 [IU]/mL (ref 0.30–0.70)
Heparin Unfractionated: 0.36 IU/mL (ref 0.30–0.70)

## 2018-02-02 LAB — URINALYSIS, ROUTINE W REFLEX MICROSCOPIC
BILIRUBIN URINE: NEGATIVE
Glucose, UA: NEGATIVE mg/dL
Ketones, ur: NEGATIVE mg/dL
Leukocytes, UA: NEGATIVE
NITRITE: NEGATIVE
PH: 7 (ref 5.0–8.0)
Protein, ur: NEGATIVE mg/dL
SPECIFIC GRAVITY, URINE: 1.004 — AB (ref 1.005–1.030)

## 2018-02-02 LAB — CBC
HCT: 31.9 % — ABNORMAL LOW (ref 36.0–46.0)
Hemoglobin: 9.8 g/dL — ABNORMAL LOW (ref 12.0–15.0)
MCH: 21.8 pg — AB (ref 26.0–34.0)
MCHC: 30.7 g/dL (ref 30.0–36.0)
MCV: 70.9 fL — ABNORMAL LOW (ref 78.0–100.0)
PLATELETS: 179 10*3/uL (ref 150–400)
RBC: 4.5 MIL/uL (ref 3.87–5.11)
RDW: 16.5 % — ABNORMAL HIGH (ref 11.5–15.5)
WBC: 6.9 10*3/uL (ref 4.0–10.5)

## 2018-02-02 LAB — GLUCOSE, CAPILLARY: Glucose-Capillary: 87 mg/dL (ref 65–99)

## 2018-02-02 LAB — SODIUM, URINE, RANDOM: Sodium, Ur: 99 mmol/L

## 2018-02-02 LAB — CREATININE, URINE, RANDOM: Creatinine, Urine: 21.38 mg/dL

## 2018-02-02 MED ORDER — FLUCONAZOLE 100 MG PO TABS
100.0000 mg | ORAL_TABLET | Freq: Every day | ORAL | Status: DC
  Administered 2018-02-02 – 2018-02-07 (×6): 100 mg via ORAL
  Filled 2018-02-02 (×6): qty 1

## 2018-02-02 MED ORDER — VENLAFAXINE HCL ER 75 MG PO CP24
75.0000 mg | ORAL_CAPSULE | Freq: Every day | ORAL | Status: DC
  Administered 2018-02-03 – 2018-02-19 (×17): 75 mg via ORAL
  Filled 2018-02-02 (×17): qty 1

## 2018-02-02 MED ORDER — POLYETHYLENE GLYCOL 3350 17 G PO PACK
17.0000 g | PACK | Freq: Every day | ORAL | Status: DC
  Administered 2018-02-02 – 2018-02-04 (×3): 17 g via ORAL
  Filled 2018-02-02 (×13): qty 1

## 2018-02-02 MED ORDER — FUROSEMIDE 10 MG/ML IJ SOLN
60.0000 mg | Freq: Once | INTRAMUSCULAR | Status: AC
  Administered 2018-02-02: 60 mg via INTRAVENOUS
  Filled 2018-02-02: qty 6

## 2018-02-02 MED ORDER — SENNOSIDES-DOCUSATE SODIUM 8.6-50 MG PO TABS
1.0000 | ORAL_TABLET | Freq: Two times a day (BID) | ORAL | Status: DC
  Administered 2018-02-02 – 2018-02-19 (×32): 1 via ORAL
  Filled 2018-02-02 (×36): qty 1

## 2018-02-02 MED ORDER — GABAPENTIN 300 MG PO CAPS
300.0000 mg | ORAL_CAPSULE | Freq: Every day | ORAL | Status: DC
  Administered 2018-02-03 – 2018-02-18 (×16): 300 mg via ORAL
  Filled 2018-02-02 (×16): qty 1

## 2018-02-02 MED ORDER — FAMOTIDINE 20 MG PO TABS
20.0000 mg | ORAL_TABLET | Freq: Every day | ORAL | Status: DC
  Administered 2018-02-03 – 2018-02-19 (×17): 20 mg via ORAL
  Filled 2018-02-02 (×17): qty 1

## 2018-02-02 NOTE — Progress Notes (Signed)
  Progress Note    02/02/2018 11:20 AM 2 Days Post-Op  Subjective:  Still having left arm pain, cannot urinate  Vitals:   02/02/18 0233 02/02/18 1021  BP: (!) 151/106 (!) 155/93  Pulse: 89   Resp: 19   Temp: 98.5 F (36.9 C)   SpO2: 100%     Physical Exam: [email or web address] Left arm is soft with scant edema Palpable left wrist pulses  CBC    Component Value Date/Time   WBC 6.9 02/02/2018 0226   RBC 4.50 02/02/2018 0226   HGB 9.8 (L) 02/02/2018 0226   HGB 11.7 07/06/2014 1504   HGB 11.7 07/01/2009 0910   HCT 31.9 (L) 02/02/2018 0226   HCT 36.8 07/06/2014 1504   HCT 36.4 07/01/2009 0910   PLT 179 02/02/2018 0226   PLT 204 07/06/2014 1504   PLT 190 07/01/2009 0910   MCV 70.9 (L) 02/02/2018 0226   MCV 72 (L) 07/06/2014 1504   MCV 70.7 (L) 07/01/2009 0910   MCH 21.8 (L) 02/02/2018 0226   MCHC 30.7 02/02/2018 0226   RDW 16.5 (H) 02/02/2018 0226   RDW 14.8 07/06/2014 1504   RDW 15.0 (H) 07/01/2009 0910   LYMPHSABS 1.7 01/30/2018 1248   LYMPHSABS 1.4 07/06/2014 1504   LYMPHSABS 1.0 07/01/2009 0910   MONOABS 0.5 01/30/2018 1248   MONOABS 0.4 07/01/2009 0910   EOSABS 0.3 01/30/2018 1248   EOSABS 0.2 07/06/2014 1504   BASOSABS 0.0 01/30/2018 1248   BASOSABS 0.0 07/06/2014 1504   BASOSABS 0.0 07/01/2009 0910    BMET    Component Value Date/Time   NA 140 02/02/2018 0226   NA 143 06/09/2013 1118   K 3.7 02/02/2018 0226   K 3.7 06/09/2013 1118   CL 112 (H) 02/02/2018 0226   CL 108 06/09/2013 1118   CO2 20 (L) 02/02/2018 0226   CO2 27 06/09/2013 1118   GLUCOSE 100 (H) 02/02/2018 0226   GLUCOSE 97 06/09/2013 1118   BUN 20 02/02/2018 0226   BUN 12 06/09/2013 1118   CREATININE 5.25 (H) 02/02/2018 0226   CREATININE 1.09 03/09/2014 0745   CALCIUM 7.9 (L) 02/02/2018 0226   CALCIUM 9.6 06/09/2013 1118   GFRNONAA 8 (L) 02/02/2018 0226   GFRAA 10 (L) 02/02/2018 0226    INR    Component Value Date/Time   INR 0.91 01/30/2018 1248     Intake/Output Summary (Last 24  hours) at 02/02/2018 1120 Last data filed at 02/02/2018 [id number] Gross per 24 hour  Intake 2273.17 ml  Output 100 ml  Net 2173.17 ml     Assessment:  57 y.o. female is s/p angiojet thrombectomy of left upper extremity for extensive dvt now with AKI   Plan: AKI likely secondary to procedure which is usually ATN and will resolve with time but may require short term dialysis. Nephrology is being consulted today. Continue heparin drip. If she needs dialysis will place tunneled catheter as directed by nephrology. xrays were reviewed with patient.    Orlando Devereux C. Randie Heinzain, MD [facility] Office: [phone] Pager: [phone]  02/02/2018 11:20 AM

## 2018-02-02 NOTE — Plan of Care (Signed)
  Progressing Health Behavior/Discharge Planning: Ability to manage health-related needs will improve 02/02/2018 0340 - Progressing by Peggye Pitt, RN Skin Integrity: Risk for impaired skin integrity will decrease 02/02/2018 0340 - Progressing by Peggye Pitt, RN   Completed/Met Nutrition: Adequate nutrition will be maintained 02/02/2018 0340 - Completed/Met by Peggye Pitt, RN

## 2018-02-02 NOTE — Progress Notes (Signed)
ANTICOAGULATION CONSULT NOTE - Follow Up Consult  Pharmacy Consult for heparin Indication: DVT  Labs: Recent Labs    01/30/18 1248  01/31/18 0314  02/01/18 0436 02/01/18 1552 02/02/18 0226  HGB 12.1   < > 11.4*  --  10.4*  --  9.8*  HCT 37.9   < > 36.1  --  33.4*  --  31.9*  PLT 242   < > 212  --  196  --  179  LABPROT 12.2  --   --   --   --   --   --   INR 0.91  --   --   --   --   --   --   HEPARINUNFRC  --    < > 1.86*   < > 1.46* 0.86* 0.81*  CREATININE 1.14*  --  1.07*  --  2.98*  --  5.25*  TROPONINI <0.03  --   --   --   --   --   --    < > = values in this interval not displayed.    Assessment: 57yo female remains supratherapeutic on heparin with small change in level despite rate change; RN notes no gtt issues or signs of bleeding.  Goal of Therapy:  Heparin level 0.3-0.7 units/ml   Plan:  Will decrease heparin gtt by 2 units/kg/hr to 800 units/hr and check level in 8 hours.    Vernard GamblesVeronda Milynn Quirion, PharmD, BCPS  02/02/2018,3:14 AM

## 2018-02-02 NOTE — Consult Note (Addendum)
Renal Service Consult Note [facility]  DESIA SABAN 02/02/2018 Maree Krabbe Requesting Physician:  Dr Jomarie Longs  Reason for Consult:  Acute renal failure HPI: The patient is a 57 y.o. year-old with hx oobestiy, FM, depression, anxiety, migraines and HL, asked to see for AKI.  Pt was admitted on 12/20 with L arm pain and was found to have acute DVT of LUE.  He underwent peripheral vasc thrombectomy by [facility] on 2/21 which was successful at removing clot.  Creat was 1.1 on 2/20, up to 2.9 on 2/21 and 5.25 today, 2/22.   We are asked to see for AKI.  NO hx renal failure.  No nsaids , acei or ARB.  Pt did get IV contrast 100 cc on 2/20 with CT angio of chest, and 40 cc IV on 12/21 with the thrombectomy procedure.  She feels like her bladder is full and she has to pee.  Says she sometimes has to take lasix at home to "get my pee started", asking for some lasix.  No UOP today yet per pt.    2/20 - LUE doppler, externsive DVT of brach/ SCV/ innominate veins on L  2/20 - CT angio of chest, 100 cc IV dye - no PE 2/21 - peripheral vasc thrombectomy by Dr Imogene Burn , LUE, 40 cc IV dye 2/22 - CXR - negative 2/22 - C-spine xray - chron degen disease, no cord compress 2/22 - esophagram > +stricture, done for dysphagia  ROS  denies CP  no joint pain   no HA  no blurry vision  no rash  no diarrhea  no nausea/ vomiting   Past Medical History  Past Medical History:  Diagnosis Date  . Adjustment disorder with mixed anxiety and depressed mood 09/28/2009   Qualifier: Diagnosis of  By: Nena Jordan   . Allergic state 04/09/2013  . Anemia    thalassemia  . Anxiety    takes Ativan daily prn  . Arthritis    sees Dr.Beekman  . Bursitis of right shoulder   . Cholecystitis, acute 08/30/2013  . Chronic insomnia   . Complication of anesthesia    hard to sedate  . Constipation    takes Linzess daily prn  . Depression    takes Effexor daily  . Difficulty swallowing solids   .  DVT (deep venous thrombosis) (HCC) 01/31/2018   LUE  . Fibromyalgia   . GERD (gastroesophageal reflux disease) 06/17/2013   takes Dexilant daily prn  . H. pylori infection 07/19/2013  . H/O hiatal hernia   . Headache(784.0) 05/22/2013  . Hereditary and idiopathic peripheral neuropathy 03/14/2015  . History of bronchitis    last time many yrs ago  . History of colonoscopy 2000  . Hyperlipidemia    not on any meds   . Hypoglycemia   . Hypothyroidism   . Lichen    Dr.Fleisher at [facility]  . Migraines    takes Topamax daily-last migraine today  . Muscle spasms of head and/or neck    takes Norflex daily prn  . Nausea    takes Phenergan prn  . Obesity   . Peripheral edema    takes Lasix prn  . PONV (postoperative nausea and vomiting)    with tonsil removal surgery  . PUD (peptic ulcer disease)    Dr Loreta Ave ( H. Pylori)  . Seizure disorder (HCC) x 2   presumed secondary to ativan withdrawal -Dr Sharene Skeans  . Seizures (HCC)    X 2  .  Shortness of breath    pt states when Fibromyalgia flares up  . Sinusitis, acute 04/09/2013   takes Zyrtec daily   Past Surgical History  Past Surgical History:  Procedure Laterality Date  . ABDOMINAL HYSTERECTOMY  2000   partial  . CHOLECYSTECTOMY N/A 09/16/2013   Procedure: LAPAROSCOPIC CHOLECYSTECTOMY;  Surgeon: Emelia Loron, MD;  Location: [facility] OR;  Service: General;  Laterality: N/A;  . COLONOSCOPY    . PERIPHERAL VASCULAR THROMBECTOMY Left 01/31/2018   Procedure: PERIPHERAL VASCULAR THROMBECTOMY;  Surgeon: Fransisco Hertz, MD;  Location: [facility] INVASIVE CV LAB;  Service: Cardiovascular;  Laterality: Left;  . REMOVAL OF CERVICAL EXOSTOSIS N/A 06/08/2016   Procedure: REMOVAL OF  EXOSTOSIS ANTERIOR CERVICAL SPINE;  Surgeon: Venita Lick, MD;  Location: [facility] OR;  Service: Orthopedics;  Laterality: N/A;  . TONSILLECTOMY  1972   Family History  Family History  Problem Relation Age of Onset  . Diabetes Mother        typer 2  . Heart disease Mother         PVD, PAD  . Cancer Father        prostate, lung, brain mets, smoker  . COPD Father   . Hyperlipidemia Father   . Hypertension Father   . Thyroid disease Father   . Hypertension Sister   . Thyroid disease Sister   . Arthritis Sister   . Diabetes Son 16       type 1  . Diabetes Maternal Grandmother   . Stroke Maternal Grandfather   . Cancer Paternal Grandmother 10       breast  . Alcohol abuse Paternal Grandfather   . Hypertension Sister   . Obesity Sister   . Psoriasis Son   . Cancer Unknown        Breast < 4 yo, ,Prostate <50 yo  . Coronary artery disease Unknown        <60 yo  . Diabetes Unknown    Social History  reports that  has never smoked. she has never used smokeless tobacco. She reports that she does not drink alcohol or use drugs. Allergies  Allergies  Allergen Reactions  . Aspirin Other (See Comments)    Per Dr  . Augmentin [Amoxicillin-Pot Clavulanate] Other (See Comments)    Blisters in the back of throat  . Dexilant [Dexlansoprazole] Other (See Comments)    Bowel urgency and liquid stools  . Hydrocodone-Acetaminophen Hives    REACTION: Rash  . Influenza Vaccines Swelling    Arm swelled to the size of a grapefruit  . Levsin [Hyoscyamine Sulfate] Other (See Comments)    Projectile vomitting  . Linzess [Linaclotide] Other (See Comments)    Bowel urgency and liquid stools  . Promethazine Nausea And Vomiting  . Propoxyphene N-Acetaminophen Itching    REACTION: rash  . Statins Other (See Comments)    myalgia  . Acetaminophen Itching and Rash  . Latex Itching and Rash    Rash-looks like she has the measles per pt  . Oxycodone-Acetaminophen Itching and Rash    REACTION: Rash   Home medications Prior to Admission medications   Medication Sig Start Date End Date Taking? Authorizing Provider  ARIPiprazole (ABILIFY) 2 MG tablet Take 1 tablet (2 mg total) by mouth daily. For mood control 01/25/18  Yes Money, Gerlene Burdock, FNP  Bisacodyl (DULCOLAX PO) Take 10 mg  by mouth as needed (constipation).   Yes [provider]  cyclobenzaprine (FLEXERIL) 10 MG tablet Take 10 mg by mouth 3 (  three) times daily as needed for muscle spasms.   Yes [provider]  gabapentin (NEURONTIN) 300 MG capsule Take 2 capsules (600 mg total) by mouth 3 (three) times daily. 01/24/18  Yes Money, Gerlene Burdockravis B, FNP  hydrOXYzine (ATARAX/VISTARIL) 50 MG tablet Take 1 tablet (50 mg total) by mouth 3 (three) times daily as needed for anxiety. 01/24/18  Yes Money, Gerlene Burdockravis B, FNP  levothyroxine (SYNTHROID, LEVOTHROID) 25 MCG tablet TAKE 1 TABLET BY MOUTH DAILY BEFORE BREAKFAST 03/30/16  Yes Bradd CanaryBlyth, Stacey A, MD  Menthol-Methyl Salicylate (BENGAY ARTHRITIS FORMULA EX) Apply 1 application topically as needed (neck and shoulder pain).   Yes [provider]  mirtazapine (REMERON) 15 MG tablet Take 1 tablet (15 mg total) by mouth at bedtime. For mood control 01/24/18  Yes Money, Feliz Beamravis B, FNP  oxyCODONE (OXY IR/ROXICODONE) 5 MG immediate release tablet Take 2 tablets (10 mg total) by mouth every 6 (six) hours as needed for severe pain. 06/08/16  Yes Venita LickBrooks, Dahari, MD  topiramate (TOPAMAX) 100 MG tablet Take 1 tablet (100 mg total) by mouth 2 (two) times daily. 01/24/18  Yes Money, Gerlene Burdockravis B, FNP  venlafaxine XR (EFFEXOR-XR) 75 MG 24 hr capsule Take 3 capsules (225 mg total) by mouth daily with breakfast. 01/25/18  Yes Money, Gerlene Burdockravis B, FNP  ZETIA 10 MG tablet Take 10 mg by mouth daily. 10/18/17  Yes [provider]   Liver Function Tests No results for input(s): AST, ALT, ALKPHOS, BILITOT, PROT, ALBUMIN in the last 168 hours. No results for input(s): LIPASE, AMYLASE in the last 168 hours. CBC Recent Labs  Lab 01/30/18 1248  01/31/18 0314 02/01/18 0436 02/02/18 0226  WBC 6.0   < > 5.8 6.1 6.9  NEUTROABS 3.5  --   --   --   --   HGB 12.1   < > 11.4* 10.4* 9.8*  HCT 37.9   < > 36.1 33.4* 31.9*  MCV 69.2*   < > 70.1* 70.2* 70.9*  PLT 242   < > 212 196 179   < > =  values in this interval not displayed.   Basic Metabolic Panel Recent Labs  Lab 01/30/18 1248 01/31/18 0314 02/01/18 0436 02/02/18 0226  NA 143 142 142 140  K 3.4* 3.3* 3.8 3.7  CL 109 111 110 112*  CO2 26 22 22  20*  GLUCOSE 105* 106* 105* 100*  BUN 13 8 14 20   CREATININE 1.14* 1.07* 2.98* 5.25*  CALCIUM 8.9 8.5* 8.6* 7.9*   Iron/TIBC/Ferritin/ %Sat No results found for: IRON, TIBC, FERRITIN, IRONPCTSAT  Vitals:   02/01/18 1331 02/01/18 2046 02/02/18 0233 02/02/18 1021  BP: (!) 141/70 118/83 (!) 151/106 (!) 155/93  Pulse: 90 82 89 88  Resp: 18 (!) 26 19 18   Temp: 98.1 F (36.7 C) 97.9 F (36.6 C) 98.5 F (36.9 C) 98.1 F (36.7 C)  TempSrc: Oral Oral Oral Oral  SpO2: 100% 100% 100% 100%  Weight:      Height:       Exam Gen pleasant AAF, no distress No rash, cyanosis or gangrene Sclera anicteric, throat clear  No jvd or bruits Chest clear bilat, no rales or wheezing RRR no MRG Abd soft ntnd no mass or ascites +bs obese GU defer MS no joint effusions or deformity Ext no LE edema, no wounds or ulcers; LUE mild swelling, sore to touch Neuro is alert, Ox 3 , nf   Impression: 1  Acute renal failure - prob contrast nephropathy, but need to  r/o urinary retention as well. Has a history of difficulty voiding.  Ordered bladder scan, IV lasix 60 mg x 1, Renal US, UA and urine lytes.  Cont IVF at 100/hr.  Supportive care otherwise.  Explained to pt and quetsions answered. Will follow.   2  Acute LUE DVT - sp peripheral thrombectomy 2/21 3  Volume - looks euvolemic 4  Hx FM/ anxiety 5  HTN - on norvasc, BP's on high side, continue   Plan - as above  Vinson Moselle MD [employer] pager [phone]   02/02/2018, 4:47 PM

## 2018-02-02 NOTE — Progress Notes (Signed)
PROGRESS NOTE [facility]   Heather Hanson   WGN:[id number] DOB: 30-Apr-1961  DOA: 01/30/2018 PCP: Macy Mis, MD   Brief Narrative:  Heather Hanson is a 57 year old female with medical history significant for hyperlipidemia, GERD, hypothyroidism, depression and anxiety who presented to the emergency department complaining of left upper arm pain and swelling.  Upon ED evaluation patient was found to have left upper arm swelling venous Doppler showed deep venous thrombosis involving the brachial veins in the subclavians with a thrombus extending.  Also positive for superficial venous thrombosis involving the left basilic vein.  Vascular surgery was consulted and recommended admission for possible TPA.  Subjective: -Feels okay overall, mild pain and discomfort in her left upper arm, reports noticing mild lower abdominal swelling  Assessment & Plan:  Acute symptomatic central deep vein thrombosis of left arm -Vascular surgery following, and patient underwent TPA and thrombectomy 2/21 -Continue IV heparin, transition to oral anticoagulation only when certain that no further procedures needed -Risk factor for DVT unclear at this time -She has a history of thalassemia which which could potentially increase viscosity of blood  Acute on chronic dysphagia /esophageal stricture/ -Worse with cold liquids  -Barium esophagram concerning for possible stricture -Would benefit from GI workup pending improvement in kidney function, has been seen and scoped by Dr. Loreta Ave in the past  Acute kidney injury/ATN -secondary to AngioJet thrombolysis, venogram 2/21 -Kidney function was normal prior to that -Likely has ATN, monitor urine output closely  -nephrology consult - continue IV fluids today not on any other offending medications ,   Hx of seizures  Asymptomatic  Continue Topomax   HLD  Continue Zocor   Depression and Anxiety  Continue home medications   DVT prophylaxis:  Heparin ggt  Code Status: Full Code  Family Communication: None at bedside  Disposition Plan: Home pending improvement in kidney function, overall stability of thrombus  Consultants:   Vascular surgery   Nephrology   Procedures:   TPA   Antimicrobials: Anti-infectives (From admission, onward)   Start     Dose/Rate Route Frequency Ordered Stop   02/02/18 1200  fluconazole (DIFLUCAN) tablet 100 mg     100 mg Oral Daily 02/02/18 1155     01/30/18 1515  clindamycin (CLEOCIN) IVPB 900 mg     900 mg 100 mL/hr over 30 Minutes Intravenous  Once 01/30/18 1510 01/30/18 1608          Objective: Vitals:   02/01/18 1331 02/01/18 2046 02/02/18 0233 02/02/18 1021  BP: (!) 141/70 118/83 (!) 151/106 (!) 155/93  Pulse: 90 82 89   Resp: 18 (!) 26 19   Temp: 98.1 F (36.7 C) 97.9 F (36.6 C) 98.5 F (36.9 C)   TempSrc: Oral Oral Oral   SpO2: 100% 100% 100%   Weight:      Height:        Intake/Output Summary (Last 24 hours) at 02/02/2018 1157 Last data filed at 02/02/2018 0333 Gross per 24 hour  Intake 2273.17 ml  Output 100 ml  Net 2173.17 ml   Filed Weights   01/30/18 1056 01/31/18 1813  Weight: 107 kg (236 lb) 105.5 kg (232 lb 8 oz)    Examination: Gen: Awake, Alert, Oriented X 3, no distress HEENT: PERRLA, Neck supple, no JVD Lungs: Good air movement bilaterally, CTAB CVS: RRR, no murmurs Abd: soft, Non tender, non distended, BS present Extremities: Minimal left upper extremity swelling and tenderness Skin: no new rashes Psychiatry:  Mood &  affect appropriate.    Data Reviewed: I have personally reviewed following labs and imaging studies  CBC: Recent Labs  Lab 01/30/18 1248 01/30/18 2249 01/31/18 0314 02/01/18 0436 02/02/18 0226  WBC 6.0 6.4 5.8 6.1 6.9  NEUTROABS 3.5  --   --   --   --   HGB 12.1 12.3 11.4* 10.4* 9.8*  HCT 37.9 39.0 36.1 33.4* 31.9*  MCV 69.2* 70.4* 70.1* 70.2* 70.9*  PLT 242 212 212 196 179   Basic Metabolic Panel: Recent Labs    Lab 01/30/18 1248 01/31/18 0314 02/01/18 0436 02/02/18 0226  NA 143 142 142 140  K 3.4* 3.3* 3.8 3.7  CL 109 111 110 112*  CO2 26 22 22  20*  GLUCOSE 105* 106* 105* 100*  BUN 13 8 14 20   CREATININE 1.14* 1.07* 2.98* 5.25*  CALCIUM 8.9 8.5* 8.6* 7.9*   GFR: Estimated Creatinine Clearance: 15.1 mL/min (A) (by C-G formula based on SCr of 5.25 mg/dL (H)). Liver Function Tests: No results for input(s): AST, ALT, ALKPHOS, BILITOT, PROT, ALBUMIN in the last 168 hours. No results for input(s): LIPASE, AMYLASE in the last 168 hours. No results for input(s): AMMONIA in the last 168 hours. Coagulation Profile: Recent Labs  Lab 01/30/18 1248  INR 0.91   Cardiac Enzymes: Recent Labs  Lab 01/30/18 1248  TROPONINI <0.03   BNP (last 3 results) No results for input(s): PROBNP in the last 8760 hours. HbA1C: No results for input(s): HGBA1C in the last 72 hours. CBG: Recent Labs  Lab 01/31/18 0834 02/01/18 0557 02/02/18 0609  GLUCAP 80 96 87   Lipid Profile: No results for input(s): CHOL, HDL, LDLCALC, TRIG, CHOLHDL, LDLDIRECT in the last 72 hours. Thyroid Function Tests: No results for input(s): TSH, T4TOTAL, FREET4, T3FREE, THYROIDAB in the last 72 hours. Anemia Panel: No results for input(s): VITAMINB12, FOLATE, FERRITIN, TIBC, IRON, RETICCTPCT in the last 72 hours. Sepsis Labs: No results for input(s): PROCALCITON, LATICACIDVEN in the last 168 hours.  Recent Results (from the past 240 hour(s))  Blood culture (routine x 2)     Status: None (Preliminary result)   Collection Time: 01/30/18  3:25 PM  Result Value Ref Range Status   Specimen Description   Final    BLOOD RIGHT ANTECUBITAL Performed at [facility], [address] [place], [place] [address]    Special Requests   Final    BOTTLES DRAWN AEROBIC AND ANAEROBIC Blood Culture adequate volume Performed at [facility], [address] [place], [place] [address]    Culture   Final    NO  GROWTH 2 DAYS Performed at [facility], [address] [place], [place] [address]    Report Status PENDING  Incomplete  Blood culture (routine x 2)     Status: None (Preliminary result)   Collection Time: 01/30/18  3:30 PM  Result Value Ref Range Status   Specimen Description   Final    BLOOD BLOOD RIGHT HAND Performed at [facility], [address] [place], [place] [address]    Special Requests   Final    IN PEDIATRIC BOTTLE Blood Culture adequate volume Performed at [facility], [address] [place], [place] [address]    Culture   Final    NO GROWTH 2 DAYS Performed at [facility], [address], [place], [place] [address]    Report Status PENDING  Incomplete  Radiology Studies: X-ray Chest Pa Or Ap  Result Date: 02/01/2018 CLINICAL DATA:  Recent thrombolysis and thrombectomy of left arm DVT. EXAM: CHEST 1 VIEW COMPARISON:  CT chest dated January 30, 2018. Chest x-ray dated October 27, 2013. FINDINGS: The heart size and mediastinal contours are within normal limits. Both lungs are clear. The visualized skeletal structures are unremarkable. Oral contrast in the stomach. IMPRESSION: No active disease. Electronically Signed   By: Obie Dredge M.D.   On: 02/01/2018 16:53   X-ray Cervical Spine Ap And Lateral  Result Date: 02/01/2018 CLINICAL DATA:  Concern for hardware left behind after neck surgery in 2017. EXAM: CERVICAL SPINE - 2-3 VIEW COMPARISON:  08/22/2017 radiographs and 06/08/2016 intraoperative radiographs FINDINGS: There is degenerative disc disease and spondylosis with disc space flattening from C3 through C6 with anterior osteophytes. No prevertebral soft tissue swelling is noted. The atlantodental interval is maintained. There is slight straightening of the cervical curvature. The overlying airway is patent. Atherosclerotic calcification along the extracranial carotid arteries. No radiopaque foreign body is seen  radiographically. Cardiac monitoring devices are noted along the upper anterior chest with film marker noted anterior to the neck. There is a thin band along the surface of the skull base seen on the lateral view that may be related to patient clothing artifact or jewelry. IMPRESSION: Chronic degenerative disc disease and spondylosis. No acute osseous abnormality. No radiopaque foreign body is identified radiographically. If this persists to be of concern, cross-sectional imaging such as CT may be more sensitive. Electronically Signed   By: Tollie Eth M.D.   On: 02/01/2018 16:58   Dg Esophagus  Result Date: 02/01/2018 CLINICAL DATA:  Esophageal dysphagia.  Food getting stuck. EXAM: ESOPHOGRAM/BARIUM SWALLOW TECHNIQUE: Single contrast examination was performed using  thin barium. FLUOROSCOPY TIME:  Fluoroscopy Time:  2 minutes and 30 seconds Radiation Exposure Index (if provided by the fluoroscopic device): 77.9 mGy Number of Acquired Spot Images: 0 COMPARISON:  Chest CT 01/30/2018 FINDINGS: Initial barium swallows demonstrate normal pharyngeal motion with swallowing. No laryngeal penetration or aspiration. No upper esophageal webs, strictures or diverticuli. Mild impression on the upper esophagus by the thoracic aortic arch. No hiatal hernia was demonstrated. Distally there is a smooth strictured narrowing. No obvious mass. 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 The 13 mm barium pill would not pass through this area. No reflux was demonstrated. 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 The 13 mm barium pill would not pass through this area. Electronically Signed   By: Rudie Meyer M.D.   On: 02/01/2018 10:53      Scheduled Meds: . amLODipine  5 mg Oral Daily  . ARIPiprazole  2 mg Oral Daily  . ezetimibe  10 mg Oral Daily  . famotidine  20 mg Oral BID  . fluconazole  100 mg Oral Daily  . gabapentin  300 mg Oral TID  . levothyroxine  25 mcg Oral QAC  breakfast  . mirtazapine  15 mg Oral QHS  . sodium chloride flush  3 mL Intravenous Q12H  . topiramate  100 mg Oral BID  . venlafaxine XR  225 mg Oral Q breakfast   Continuous Infusions: . sodium chloride    . sodium chloride 125 mL/hr at 02/02/18 0754  . heparin 800 Units/hr (02/02/18 0321)     LOS: 2 days    Time spent:  Zannie Cove, MD Pager: Text Page via [email or web address]   If 7PM-7AM, please contact night-coverage [email or web address]  02/02/2018, 11:57 AM   Note - This record has been created using [employer] software. Chart creation errors have been sought, but may not always have been located. Such creation errors do not reflect on the standard of medical care.

## 2018-02-02 NOTE — Progress Notes (Addendum)
ANTICOAGULATION CONSULT NOTE - Follow Up Consult  Pharmacy Consult for Heparin Indication: DVT  Patient Measurements: Height: 5\' 9"  (175.3 cm) Weight: 232 lb 8 oz (105.5 kg) IBW/kg (Calculated) : 66.2 Heparin Dosing Weight: 88 kg  Vital Signs: Temp: 98.5 F (36.9 C) (02/23 0233) Temp Source: Oral (02/23 0233) BP: 155/93 (02/23 1021) Pulse Rate: 89 (02/23 0233)  Labs: Recent Labs    01/30/18 1248  01/31/18 0314  02/01/18 0436 02/01/18 1552 02/02/18 0226 02/02/18 1056  HGB 12.1   < > 11.4*  --  10.4*  --  9.8*  --   HCT 37.9   < > 36.1  --  33.4*  --  31.9*  --   PLT 242   < > 212  --  196  --  179  --   LABPROT 12.2  --   --   --   --   --   --   --   INR 0.91  --   --   --   --   --   --   --   HEPARINUNFRC  --    < > 1.86*   < > 1.46* 0.86* 0.81* 0.48  CREATININE 1.14*  --  1.07*  --  2.98*  --  5.25*  --   TROPONINI <0.03  --   --   --   --   --   --   --    < > = values in this interval not displayed.    Estimated Creatinine Clearance: 15.1 mL/min (A) (by C-G formula based on SCr of 5.25 mg/dL (H)).  Assessment: [age] YOF presented with acute DVT on 2/20 and started on IV heparin. S/p Angiojet thrombectomy with alteplase on 2/21.She is now noted with AKI (SCr up to 5.2). Holding oral Ac is case of procedures. -heparin level is at goal    Goal of Therapy:  Heparin level 0.3-0.7 units/ml Monitor platelets by anticoagulation protocol: Yes   Plan:  -No heparin changes needed -Will confirm heparin level later today -Daily heparin level and CBC  Harland Germanndrew Pierra Skora, Pharm D 02/02/2018 12:13 PM  Addendum -heparin level confirmed at goal  Plan -no heparin changes needed  Harland Germanndrew Annissa Andreoni, Pharm D 02/02/2018 8:35 PM

## 2018-02-02 NOTE — Progress Notes (Signed)
Pt complaining of urinary fullness and hesistancy, seen by Dr. Arlean HoppingSchertz who recommended bladder scanning patient and a one time dose of 60 mg Lasix.  Bladder scan urine volume was 236 ml and upon voiding, 250 ml output of urine was produced.  Dr. Arta SilenceShertz also placed orders for a urinanalysis, renal ultrasound, and urine sodium, and urine creatinine.  Will continue to monitor for patient for urinary symptoms.

## 2018-02-03 DIAGNOSIS — N179 Acute kidney failure, unspecified: Secondary | ICD-10-CM

## 2018-02-03 LAB — BASIC METABOLIC PANEL
ANION GAP: 7 (ref 5–15)
BUN: 23 mg/dL — ABNORMAL HIGH (ref 6–20)
CALCIUM: 7.9 mg/dL — AB (ref 8.9–10.3)
CHLORIDE: 113 mmol/L — AB (ref 101–111)
CO2: 19 mmol/L — AB (ref 22–32)
CREATININE: 7.18 mg/dL — AB (ref 0.44–1.00)
GFR calc Af Amer: 7 mL/min — ABNORMAL LOW (ref >60)
GFR calc non Af Amer: 6 mL/min — ABNORMAL LOW (ref >60)
Glucose, Bld: 87 mg/dL (ref 65–99)
Potassium: 3.8 mmol/L (ref 3.5–5.1)
SODIUM: 139 mmol/L (ref 135–145)

## 2018-02-03 LAB — CBC
HEMATOCRIT: 28.4 % — AB (ref 36.0–46.0)
Hemoglobin: 8.9 g/dL — ABNORMAL LOW (ref 12.0–15.0)
MCH: 22.3 pg — ABNORMAL LOW (ref 26.0–34.0)
MCHC: 31.3 g/dL (ref 30.0–36.0)
MCV: 71 fL — AB (ref 78.0–100.0)
Platelets: 168 10*3/uL (ref 150–400)
RBC: 4 MIL/uL (ref 3.87–5.11)
RDW: 17.2 % — AB (ref 11.5–15.5)
WBC: 5.7 10*3/uL (ref 4.0–10.5)

## 2018-02-03 LAB — HEPARIN LEVEL (UNFRACTIONATED)
HEPARIN UNFRACTIONATED: 0.22 [IU]/mL — AB (ref 0.30–0.70)
Heparin Unfractionated: 0.23 IU/mL — ABNORMAL LOW (ref 0.30–0.70)

## 2018-02-03 LAB — GLUCOSE, CAPILLARY: Glucose-Capillary: 97 mg/dL (ref 65–99)

## 2018-02-03 MED ORDER — BISACODYL 10 MG RE SUPP: 10.0000 mg | RECTAL | Status: DC | PRN

## 2018-02-03 MED ORDER — ALPRAZOLAM 0.5 MG PO TABS
0.5000 mg | ORAL_TABLET | Freq: Once | ORAL | Status: AC
  Administered 2018-02-03: 0.5 mg via ORAL
  Filled 2018-02-03: qty 1

## 2018-02-03 NOTE — Progress Notes (Signed)
Patient is complaining of pain in her upper abdomen. Inspected her urine and it is pink tinged. Patient states her stomach feels distended.  Notified provider.

## 2018-02-03 NOTE — Progress Notes (Signed)
ANTICOAGULATION CONSULT NOTE - Follow Up Consult  Pharmacy Consult for Heparin Indication: DVT  Patient Measurements: Height: 5\' 9"  (175.3 cm) Weight: 232 lb 8 oz (105.5 kg) IBW/kg (Calculated) : 66.2 Heparin Dosing Weight: 88 kg  Vital Signs: Temp: 98.2 F (36.8 C) (02/24 1000) Temp Source: Oral (02/24 1000) BP: 148/78 (02/24 1000) Pulse Rate: 78 (02/24 0450)  Labs: Recent Labs    02/01/18 0436  02/02/18 0226  02/02/18 1918 02/03/18 0306 02/03/18 1310  HGB 10.4*  --  9.8*  --   --  8.9*  --   HCT 33.4*  --  31.9*  --   --  28.4*  --   PLT 196  --  179  --   --  168  --   HEPARINUNFRC 1.46*   < > 0.81*   < > 0.36 0.23* 0.22*  CREATININE 2.98*  --  5.25*  --   --  7.18*  --    < > = values in this interval not displayed.    Estimated Creatinine Clearance: 11 mL/min (A) (by C-G formula based on SCr of 7.18 mg/dL (H)).  Assessment: [age] YOF presented with acute DVT on 2/20 and started on IV heparin. S/p Angiojet thrombectomy with alteplase on 2/21.She is now noted with AKI (SCr up to 5.2). Holding oral Ac is case of procedures. -heparin level is below goal on 900 units/hr    Goal of Therapy:  Heparin level 0.3-0.7 units/ml Monitor platelets by anticoagulation protocol: Yes   Plan:  -Increase heparin to 1050 units/hr -Heparin level in 8 hours and daily wth CBC daily   Harland Germanndrew Beatrice Sehgal, Pharm D 02/03/2018 3:16 PM

## 2018-02-03 NOTE — Progress Notes (Addendum)
Progress Note    02/03/2018 8:36 AM 3 Days Post-Op  Subjective:  Wants to see the rest of her pictures.  Says she needs her medicine that keeps her pretty and sweet.  Afebrile HR 70's-80's NSR 120's-150's systolic 100% RA  Vitals:   02/02/18 2126 02/03/18 0450  BP:  (!) 143/86  Pulse:  78  Resp: 20 18  Temp: 98.2 F (36.8 C) 97.9 F (36.6 C)  SpO2:  100%    Physical Exam: General:  No distress Cardiac:  regular Lungs:  Non labored Incisions:  Mild swelling LUE Extremities:  Easily palpable left radial pulse    CBC    Component Value Date/Time   WBC 5.7 02/03/2018 0306   RBC 4.00 02/03/2018 0306   HGB 8.9 (L) 02/03/2018 0306   HGB 11.7 07/06/2014 1504   HGB 11.7 07/01/2009 0910   HCT 28.4 (L) 02/03/2018 0306   HCT 36.8 07/06/2014 1504   HCT 36.4 07/01/2009 0910   PLT 168 02/03/2018 0306   PLT 204 07/06/2014 1504   PLT 190 07/01/2009 0910   MCV 71.0 (L) 02/03/2018 0306   MCV 72 (L) 07/06/2014 1504   MCV 70.7 (L) 07/01/2009 0910   MCH 22.3 (L) 02/03/2018 0306   MCHC 31.3 02/03/2018 0306   RDW 17.2 (H) 02/03/2018 0306   RDW 14.8 07/06/2014 1504   RDW 15.0 (H) 07/01/2009 0910   LYMPHSABS 1.7 01/30/2018 1248   LYMPHSABS 1.4 07/06/2014 1504   LYMPHSABS 1.0 07/01/2009 0910   MONOABS 0.5 01/30/2018 1248   MONOABS 0.4 07/01/2009 0910   EOSABS 0.3 01/30/2018 1248   EOSABS 0.2 07/06/2014 1504   BASOSABS 0.0 01/30/2018 1248   BASOSABS 0.0 07/06/2014 1504   BASOSABS 0.0 07/01/2009 0910    BMET    Component Value Date/Time   NA 139 02/03/2018 0306   NA 143 06/09/2013 1118   K 3.8 02/03/2018 0306   K 3.7 06/09/2013 1118   CL 113 (H) 02/03/2018 0306   CL 108 06/09/2013 1118   CO2 19 (L) 02/03/2018 0306   CO2 27 06/09/2013 1118   GLUCOSE 87 02/03/2018 0306   GLUCOSE 97 06/09/2013 1118   BUN 23 (H) 02/03/2018 0306   BUN 12 06/09/2013 1118   CREATININE 7.18 (H) 02/03/2018 0306   CREATININE 1.09 03/09/2014 0745   CALCIUM 7.9 (L) 02/03/2018 0306   CALCIUM 9.6 06/09/2013 1118   GFRNONAA 6 (L) 02/03/2018 0306   GFRAA 7 (L) 02/03/2018 0306    INR    Component Value Date/Time   INR 0.91 01/30/2018 1248     Intake/Output Summary (Last 24 hours) at 02/03/2018 0836 Last data filed at 02/02/2018 1715 Gross per 24 hour  Intake 1231.85 ml  Output 800 ml  Net 431.85 ml     Assessment:  57 y.o. female is s/p:  angiojet thrombectomy of left upper extremity for extensive dvt now with AKI   3 Days Post-Op   Plan: -pt's creatinine continues to worsen and is at 7 this am.  Renal following -Would hold starting oral AC in case pt needs tunneled dialysis catheter.   -DVT prophylaxis:  Heparin gtt   Doreatha MassedSamantha Rhyne, PA-C [facility] [phone] 02/03/2018 8:36 AM    I have interviewed and examined patient with PA and agree with assessment and plan above. Cr elevated but patient making urine with nephrology following. Continue heparin drip for anticoagulation and I have recommended she elevate her left arm when resting.  Charliene Inoue C. Randie Heinzain, MD [facility] [place] Office: [phone] Pager: [phone]

## 2018-02-03 NOTE — Progress Notes (Signed)
ANTICOAGULATION CONSULT NOTE - Follow Up Consult  Pharmacy Consult for heparin Indication: DVT  Labs: Recent Labs    02/01/18 0436  02/02/18 0226 02/02/18 1056 02/02/18 1918 02/03/18 0306  HGB 10.4*  --  9.8*  --   --  8.9*  HCT 33.4*  --  31.9*  --   --  28.4*  PLT 196  --  179  --   --  168  HEPARINUNFRC 1.46*   < > 0.81* 0.48 0.36 0.23*  CREATININE 2.98*  --  5.25*  --   --  7.18*   < > = values in this interval not displayed.    Assessment: 57yo female now subtherapeutic on heparin after two levels at goal though had been trending down; RN notes no gtt issues or signs of bleeding.  Goal of Therapy:  Heparin level 0.3-0.7 units/ml   Plan:  Will increase heparin gtt by 1-2 units/kg/hr to 900 units/hr and check level in 8 hours.    Vernard GamblesVeronda Jakobi Thetford, PharmD, BCPS  02/03/2018,4:26 AM

## 2018-02-03 NOTE — Progress Notes (Signed)
PROGRESS NOTE [facility]   Heather Hanson   VHQ:[id number] DOB: 1961/06/24  DOA: 01/30/2018 PCP: Macy MisBriscoe, Kim K, MD   Brief Narrative:  Heather Hanson is a 57 year old female with medical history significant for hyperlipidemia, GERD, hypothyroidism, depression and anxiety who presented to the emergency department complaining of left upper arm pain and swelling.  Upon ED evaluation patient was found to have left upper arm swelling venous Doppler showed deep venous thrombosis involving the brachial veins in the subclavians with a thrombus extending.  Also positive for superficial venous thrombosis involving the left basilic vein.  Vascular surgery was consulted and recommended admission for possible TPA.  Subjective: -Feels okay overall, A Single Dose of Lasix Yesterday Afternoon, Urinated 800 mL overnight and another 300 this morning -Denies any dyspnea this morning - Assessment & Plan:  Acute symptomatic central deep vein thrombosis of left arm -Vascular surgery following, and patient underwent TPA and thrombectomy 2/21 -Continue IV heparin, transition to oral anticoagulation only when certain that no further procedures needed -Risk factor for DVT unclear at this time -She has a history of thalassemia which which could potentially increase viscosity of blood  Acute kidney injury/ATN from contrast-induced nephropathy -secondary to AngioJet thrombolysis, venogram 2/21 and CT angiogram of the chest on admission -Kidney function was normal prior to that -Likely has ATN, monitor urine output closely  -nephrology consult appreciated -Unfortunately creatinine continues to rise, cut down IV fluids, she is 4 L positive 2 Acute on chronic dysphagia /esophageal stricture/ -Worse with cold liquids  -Barium esophagram concerning for possible stricture -Would benefit from GI workup pending improvement in kidney function, has been seen and scoped by Dr. Loreta AveMann in the past  Hx of  seizures  Asymptomatic  Continue Topomax   HLD  Continue Zocor   Depression and Anxiety  Continue home medications   DVT prophylaxis: Heparin ggt  Code Status: Full Code  Family Communication: None at bedside  Disposition Plan: Home pending improvement in kidney function  Consultants:   Vascular surgery   Nephrology   Procedures:   TPA   Antimicrobials: Anti-infectives (From admission, onward)   Start     Dose/Rate Route Frequency Ordered Stop   02/02/18 1200  fluconazole (DIFLUCAN) tablet 100 mg     100 mg Oral Daily 02/02/18 1155     01/30/18 1515  clindamycin (CLEOCIN) IVPB 900 mg     900 mg 100 mL/hr over 30 Minutes Intravenous  Once 01/30/18 1510 01/30/18 1608          Objective: Vitals:   02/02/18 1700 02/02/18 2126 02/03/18 0450 02/03/18 1000  BP: 129/85  (!) 143/86 (!) 148/78  Pulse: 87  78   Resp: 18 20 18    Temp: 98.6 F (37 C) 98.2 F (36.8 C) 97.9 F (36.6 C) 98.2 F (36.8 C)  TempSrc: Oral Oral Oral Oral  SpO2: 100%  100%   Weight:      Height:        Intake/Output Summary (Last 24 hours) at 02/03/2018 1307 Last data filed at 02/03/2018 0839 Gross per 24 hour  Intake -  Output 1000 ml  Net -1000 ml   Filed Weights   01/30/18 1056 01/31/18 1813  Weight: 107 kg (236 lb) 105.5 kg (232 lb 8 oz)    Examination:  Gen: Awake, Alert, Oriented X 3,  HEENT: PERRLA, Neck supple, no JVD Lungs: Diminished breath sounds at the bases CVS: RRR,No Gallops,Rubs or new Murmurs Abd: soft, Non tender, non distended,  mild lower abdominal wall edema Extremities: No edema  Skin: no new rashes Psychiatry:  Mood & affect appropriate.    Data Reviewed: I have personally reviewed following labs and imaging studies  CBC: Recent Labs  Lab 01/30/18 1248 01/30/18 2249 01/31/18 0314 02/01/18 0436 02/02/18 0226 02/03/18 0306  WBC 6.0 6.4 5.8 6.1 6.9 5.7  NEUTROABS 3.5  --   --   --   --   --   HGB 12.1 12.3 11.4* 10.4* 9.8* 8.9*  HCT 37.9 39.0  36.1 33.4* 31.9* 28.4*  MCV 69.2* 70.4* 70.1* 70.2* 70.9* 71.0*  PLT 242 212 212 196 179 168   Basic Metabolic Panel: Recent Labs  Lab 01/30/18 1248 01/31/18 0314 02/01/18 0436 02/02/18 0226 02/03/18 0306  NA 143 142 142 140 139  K 3.4* 3.3* 3.8 3.7 3.8  CL 109 111 110 112* 113*  CO2 26 22 22  20* 19*  GLUCOSE 105* 106* 105* 100* 87  BUN 13 8 14 20  23*  CREATININE 1.14* 1.07* 2.98* 5.25* 7.18*  CALCIUM 8.9 8.5* 8.6* 7.9* 7.9*   GFR: Estimated Creatinine Clearance: 11 mL/min (A) (by C-G formula based on SCr of 7.18 mg/dL (H)). Liver Function Tests: No results for input(s): AST, ALT, ALKPHOS, BILITOT, PROT, ALBUMIN in the last 168 hours. No results for input(s): LIPASE, AMYLASE in the last 168 hours. No results for input(s): AMMONIA in the last 168 hours. Coagulation Profile: Recent Labs  Lab 01/30/18 1248  INR 0.91   Cardiac Enzymes: Recent Labs  Lab 01/30/18 1248  TROPONINI <0.03   BNP (last 3 results) No results for input(s): PROBNP in the last 8760 hours. HbA1C: No results for input(s): HGBA1C in the last 72 hours. CBG: Recent Labs  Lab 01/31/18 0834 02/01/18 0557 02/02/18 0609 02/03/18 0640  GLUCAP 80 96 87 97   Lipid Profile: No results for input(s): CHOL, HDL, LDLCALC, TRIG, CHOLHDL, LDLDIRECT in the last 72 hours. Thyroid Function Tests: No results for input(s): TSH, T4TOTAL, FREET4, T3FREE, THYROIDAB in the last 72 hours. Anemia Panel: No results for input(s): VITAMINB12, FOLATE, FERRITIN, TIBC, IRON, RETICCTPCT in the last 72 hours. Sepsis Labs: No results for input(s): PROCALCITON, LATICACIDVEN in the last 168 hours.  Recent Results (from the past 240 hour(s))  Blood culture (routine x 2)     Status: None (Preliminary result)   Collection Time: 01/30/18  3:25 PM  Result Value Ref Range Status   Specimen Description   Final    BLOOD RIGHT ANTECUBITAL Performed at [facility], [address] [place], [place] [address]    Special  Requests   Final    BOTTLES DRAWN AEROBIC AND ANAEROBIC Blood Culture adequate volume Performed at [facility], [address] [place], [place] [address]    Culture   Final    NO GROWTH 3 DAYS Performed at [facility], [address], [place], [place] [address]    Report Status PENDING  Incomplete  Blood culture (routine x 2)     Status: None (Preliminary result)   Collection Time: 01/30/18  3:30 PM  Result Value Ref Range Status   Specimen Description   Final    BLOOD BLOOD RIGHT HAND Performed at [facility], [address] [place], [place] [address]    Special Requests   Final    IN PEDIATRIC BOTTLE Blood Culture adequate volume Performed at [facility], [address] [place], [place] [address]  Culture   Final    NO GROWTH 3 DAYS Performed at [facility], [address], [place], [place] [address]    Report Status PENDING  Incomplete      Radiology Studies: X-ray Chest Pa Or Ap  Result Date: 02/01/2018 CLINICAL DATA:  Recent thrombolysis and thrombectomy of left arm DVT. EXAM: CHEST 1 VIEW COMPARISON:  CT chest dated January 30, 2018. Chest x-ray dated October 27, 2013. FINDINGS: The heart size and mediastinal contours are within normal limits. Both lungs are clear. The visualized skeletal structures are unremarkable. Oral contrast in the stomach. IMPRESSION: No active disease. Electronically Signed   By: Obie Dredge M.D.   On: 02/01/2018 16:53   X-ray Cervical Spine Ap And Lateral  Result Date: 02/01/2018 CLINICAL DATA:  Concern for hardware left behind after neck surgery in 2017. EXAM: CERVICAL SPINE - 2-3 VIEW COMPARISON:  08/22/2017 radiographs and 06/08/2016 intraoperative radiographs FINDINGS: There is degenerative disc disease and spondylosis with disc space flattening from C3 through C6 with anterior osteophytes. No prevertebral soft tissue swelling is noted. The atlantodental interval is maintained. There is  slight straightening of the cervical curvature. The overlying airway is patent. Atherosclerotic calcification along the extracranial carotid arteries. No radiopaque foreign body is seen radiographically. Cardiac monitoring devices are noted along the upper anterior chest with film marker noted anterior to the neck. There is a thin band along the surface of the skull base seen on the lateral view that may be related to patient clothing artifact or jewelry. IMPRESSION: Chronic degenerative disc disease and spondylosis. No acute osseous abnormality. No radiopaque foreign body is identified radiographically. If this persists to be of concern, cross-sectional imaging such as CT may be more sensitive. Electronically Signed   By: Tollie Eth M.D.   On: 02/01/2018 16:58   US Renal  Result Date: 02/02/2018 CLINICAL DATA:  57 year old female with acute renal insufficiency elevated creatinine. EXAM: RENAL / URINARY TRACT ULTRASOUND COMPLETE COMPARISON:  None. FINDINGS: Right Kidney: Length: 10.7 cm. The right kidney is mildly echogenic. No hydronephrosis or shadowing stone Left Kidney: Length: 11.0 cm. The left kidney is mildly echogenic. No hydronephrosis or shadowing stone Bladder: Appears normal for degree of bladder distention. There is a 1.5 x 1.5 x 1.1 cm low attenuating lesion in the liver. IMPRESSION: 1. Mildly echogenic kidneys may represent underlying medical renal disease. Clinical correlation is recommended 2. No hydronephrosis or shadowing stone. Electronically Signed   By: Elgie Collard M.D.   On: 02/02/2018 23:02      Scheduled Meds: . amLODipine  5 mg Oral Daily  . ARIPiprazole  2 mg Oral Daily  . ezetimibe  10 mg Oral Daily  . famotidine  20 mg Oral Daily  . fluconazole  100 mg Oral Daily  . gabapentin  300 mg Oral QHS  . levothyroxine  25 mcg Oral QAC breakfast  . polyethylene glycol  17 g Oral Daily  . senna-docusate  1 tablet Oral BID  . sodium chloride flush  3 mL Intravenous Q12H  .  topiramate  100 mg Oral BID  . venlafaxine XR  75 mg Oral Q breakfast   Continuous Infusions: . sodium chloride    . sodium chloride 75 mL/hr at 02/03/18 0839  . heparin 900 Units/hr (02/03/18 0527)     LOS: 3 days    Time spent:  Zannie Cove, MD Pager: Text Page via [email or web address]   If 7PM-7AM, please contact night-coverage [email or web address] 02/03/2018, 1:07 PM  Note - This record has been created using [employer]. Chart creation errors have been sought, but may not always have been located. Such creation errors do not reflect on the standard of medical care.

## 2018-02-03 NOTE — Progress Notes (Signed)
[facility] Progress Note  Subjective: up walking in the room, no n/v, no sob no leg swelling. 800 cc UOP yest, 300 cc so far today  Vitals:   02/02/18 1021 02/02/18 1700 02/02/18 2126 02/03/18 0450  BP: (!) 155/93 129/85  (!) 143/86  Pulse: 88 87  78  Resp: 18 18 20 18   Temp: 98.1 F (36.7 C) 98.6 F (37 C) 98.2 F (36.8 C) 97.9 F (36.6 C)  TempSrc: Oral Oral Oral Oral  SpO2: 100% 100%  100%  Weight:      Height:        Inpatient medications: . amLODipine  5 mg Oral Daily  . ARIPiprazole  2 mg Oral Daily  . ezetimibe  10 mg Oral Daily  . famotidine  20 mg Oral Daily  . fluconazole  100 mg Oral Daily  . gabapentin  300 mg Oral QHS  . levothyroxine  25 mcg Oral QAC breakfast  . polyethylene glycol  17 g Oral Daily  . senna-docusate  1 tablet Oral BID  . sodium chloride flush  3 mL Intravenous Q12H  . topiramate  100 mg Oral BID  . venlafaxine XR  75 mg Oral Q breakfast   . sodium chloride    . sodium chloride 75 mL/hr at 02/03/18 0839  . heparin 900 Units/hr (02/03/18 0527)   sodium chloride, alum & mag hydroxide-simeth, hydrOXYzine, labetalol, LORazepam, ondansetron **OR** ondansetron (ZOFRAN) IV, oxyCODONE, sodium chloride flush, zolpidem  Exam: Gen pleasant AAF, no distress No rash, cyanosis or gangrene Sclera anicteric, throat clear  No jvd or bruits Chest clear bilat, no rales or wheezing RRR no MRG Abd soft ntnd no mass or ascites +bs obese GU defer MS no joint effusions or deformity Ext no LE edema, no wounds or ulcers; LUE mild swelling, sore to touch Neuro is alert, Ox 3 , nf    2/20 - LUE doppler, externsive DVT of brach/ SCV/ innominate veins on L  2/20 - CT angio of chest, 100 cc IV dye - no PE 2/21 - peripheral vasc thrombectomy by Dr Imogene Burnhen , LUE, 40 cc IV dye 2/22 - CXR - negative 2/22 - C-spine xray - chron degen disease, no cord compress 2/22 - esophagram > +stricture, done for dysphagia  UA 2/23 > 6-30 rbc, 0-5 wbc, neg  protein UNa > 99 UCr > 21 Renal US > 11 cm kidneys, no hydro , mild ^echotexture    Impression: 1  Acute renal failure - AKI sec contrast; creat up, making urine, no indication HD yet 2  DVT sp thrombectomy 3  Vol - stable, lower IVF 75/hr 4  Hx FM/ anxiety 5  HTN cont meds   Plan - as above   Vinson Moselleob Kreig Parson MD [facility] pager [phone]   02/03/2018, 9:41 AM   Recent Labs  Lab 02/01/18 0436 02/02/18 0226 02/03/18 0306  NA 142 140 139  K 3.8 3.7 3.8  CL 110 112* 113*  CO2 22 20* 19*  GLUCOSE 105* 100* 87  BUN 14 20 23*  CREATININE 2.98* 5.25* 7.18*  CALCIUM 8.6* 7.9* 7.9*   No results for input(s): AST, ALT, ALKPHOS, BILITOT, PROT, ALBUMIN in the last 168 hours. Recent Labs  Lab 01/30/18 1248  02/01/18 0436 02/02/18 0226 02/03/18 0306  WBC 6.0   < > 6.1 6.9 5.7  NEUTROABS 3.5  --   --   --   --   HGB 12.1   < > 10.4* 9.8* 8.9*  HCT 37.9   < > 33.4* 31.9* 28.4*  MCV 69.2*   < > 70.2* 70.9* 71.0*  PLT 242   < > 196 179 168   < > = values in this interval not displayed.   Iron/TIBC/Ferritin/ %Sat No results found for: IRON, TIBC, FERRITIN, IRONPCTSAT

## 2018-02-04 ENCOUNTER — Inpatient Hospital Stay (HOSPITAL_COMMUNITY): Payer: Medicaid Other

## 2018-02-04 LAB — CULTURE, BLOOD (ROUTINE X 2)
CULTURE: NO GROWTH
Culture: NO GROWTH
Special Requests: ADEQUATE
Special Requests: ADEQUATE

## 2018-02-04 LAB — BASIC METABOLIC PANEL
Anion gap: 8 (ref 5–15)
BUN: 26 mg/dL — AB (ref 6–20)
CHLORIDE: 112 mmol/L — AB (ref 101–111)
CO2: 18 mmol/L — AB (ref 22–32)
Calcium: 8.1 mg/dL — ABNORMAL LOW (ref 8.9–10.3)
Creatinine, Ser: 9.18 mg/dL — ABNORMAL HIGH (ref 0.44–1.00)
GFR calc Af Amer: 5 mL/min — ABNORMAL LOW (ref >60)
GFR calc non Af Amer: 4 mL/min — ABNORMAL LOW (ref >60)
Glucose, Bld: 82 mg/dL (ref 65–99)
Potassium: 3.8 mmol/L (ref 3.5–5.1)
Sodium: 138 mmol/L (ref 135–145)

## 2018-02-04 LAB — GLUCOSE, CAPILLARY: Glucose-Capillary: 86 mg/dL (ref 65–99)

## 2018-02-04 LAB — POCT ACTIVATED CLOTTING TIME: Activated Clotting Time: 235 seconds

## 2018-02-04 LAB — HEPARIN LEVEL (UNFRACTIONATED)
Heparin Unfractionated: 0.36 IU/mL (ref 0.30–0.70)
Heparin Unfractionated: 0.34 IU/mL (ref 0.30–0.70)

## 2018-02-04 LAB — CBC
HCT: 28.7 % — ABNORMAL LOW (ref 36.0–46.0)
Hemoglobin: 8.8 g/dL — ABNORMAL LOW (ref 12.0–15.0)
MCH: 21.7 pg — AB (ref 26.0–34.0)
MCHC: 30.7 g/dL (ref 30.0–36.0)
MCV: 70.7 fL — AB (ref 78.0–100.0)
PLATELETS: 181 10*3/uL (ref 150–400)
RBC: 4.06 MIL/uL (ref 3.87–5.11)
RDW: 16.9 % — AB (ref 11.5–15.5)
WBC: 6.4 10*3/uL (ref 4.0–10.5)

## 2018-02-04 MED ORDER — ALPRAZOLAM 0.5 MG PO TABS
0.5000 mg | ORAL_TABLET | Freq: Two times a day (BID) | ORAL | Status: DC | PRN
  Administered 2018-02-04 – 2018-02-19 (×23): 0.5 mg via ORAL
  Filled 2018-02-04 (×24): qty 1

## 2018-02-04 NOTE — Progress Notes (Signed)
ANTICOAGULATION CONSULT NOTE - Follow Up Consult  Pharmacy Consult for heparin Indication: DVT  Labs: Recent Labs    02/01/18 0436  02/02/18 0226  02/03/18 0306 02/03/18 1310 02/04/18 0048  HGB 10.4*  --  9.8*  --  8.9*  --   --   HCT 33.4*  --  31.9*  --  28.4*  --   --   PLT 196  --  179  --  168  --   --   HEPARINUNFRC 1.46*   < > 0.81*   < > 0.23* 0.22* 0.36  CREATININE 2.98*  --  5.25*  --  7.18*  --   --    < > = values in this interval not displayed.    Assessment/Plan:  57yo female therapeutic on heparin after rate changes. Has been difficult to achieve and maintain therapeutic levels in this pt. Will continue gtt at current rate and confirm stable with additional level.   Vernard GamblesVeronda Davita Sublett, PharmD, BCPS  02/04/2018,2:06 AM

## 2018-02-04 NOTE — Progress Notes (Signed)
ANTICOAGULATION CONSULT NOTE - Follow Up Consult  Pharmacy Consult for Heparin Indication: DVT  Allergies  Allergen Reactions  . Aspirin Other (See Comments)    Per Dr  . Augmentin [Amoxicillin-Pot Clavulanate] Other (See Comments)    Blisters in the back of throat  . Dexilant [Dexlansoprazole] Other (See Comments)    Bowel urgency and liquid stools  . Hydrocodone-Acetaminophen Hives    REACTION: Rash  . Influenza Vaccines Swelling    Arm swelled to the size of a grapefruit  . Levsin [Hyoscyamine Sulfate] Other (See Comments)    Projectile vomitting  . Linzess [Linaclotide] Other (See Comments)    Bowel urgency and liquid stools  . Promethazine Nausea And Vomiting  . Propoxyphene N-Acetaminophen Itching    REACTION: rash  . Statins Other (See Comments)    myalgia  . Acetaminophen Itching and Rash  . Latex Itching and Rash    Rash-looks like she has the measles per pt  . Oxycodone-Acetaminophen Itching and Rash    REACTION: Rash    Patient Measurements: Height: 5\' 9"  (175.3 cm) Weight: 232 lb 8 oz (105.5 kg) IBW/kg (Calculated) : 66.2 Heparin Dosing Weight: 89.5 kg  Vital Signs: Temp: 97.9 F (36.6 C) (02/25 0523) Temp Source: Oral (02/25 0523) BP: 150/88 (02/25 0956)  Labs: Recent Labs    02/02/18 0226  02/03/18 0306 02/03/18 1310 02/04/18 0048 02/04/18 0733  HGB 9.8*  --  8.9*  --   --  8.8*  HCT 31.9*  --  28.4*  --   --  28.7*  PLT 179  --  168  --   --  181  HEPARINUNFRC 0.81*   < > 0.23* 0.22* 0.36 0.34  CREATININE 5.25*  --  7.18*  --   --  9.18*   < > = values in this interval not displayed.    Estimated Creatinine Clearance: 8.6 mL/min (A) (by C-G formula based on SCr of 9.18 mg/dL (H)).   Assessment: Anticoag: heparin for extensive DVT of unknown etiology, s/p lysis 2/21. Hgb 8.8, Plts 181 stable.HL= 0.36, repeat 0.34 in goal.  -2/21 12:30: Angiojet thrombectomy w/ Alteplase  Goal of Therapy:  Heparin level 0.3-0.7 units/ml Monitor  platelets by anticoagulation protocol: Yes   Plan:  Continue IV heparin at 1050 units/hr Daily HL and CBC  Billey Wojciak S. Merilynn Finlandobertson, PharmD, BCPS Clinical Staff Pharmacist Pager [phone]  Misty Stanleyobertson, Khairi Garman Stillinger 02/04/2018,12:26 PM

## 2018-02-04 NOTE — Progress Notes (Signed)
PROGRESS NOTE [facility]   Heather Hanson   ZOX:[id number] DOB: 07-18-1961  DOA: 01/30/2018 PCP: Macy Mis, MD   Brief Narrative:  Heather Hanson is a 57 year old female with medical history significant for hyperlipidemia, GERD, hypothyroidism, depression and anxiety who presented to the emergency department complaining of left upper arm pain and swelling.  Upon ED evaluation patient was found to have left upper arm swelling venous Doppler showed deep venous thrombosis involving the brachial veins in the subclavians with a thrombus extending.  Also positive for superficial venous thrombosis involving the left basilic vein.  Vascular surgery was consulted and recommended admission for possible TPA.  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 -Vascular surgery following, and patient underwent TPA and thrombectomy 2/21 -Continue IV heparin, transition to oral anticoagulation only when certain that no further procedures needed, if has more active hematuria will hold heparin for 6-8 hours -Risk factor for DVT unclear at this time -She has a history of thalassemia which which could potentially increase viscosity of blood  Acute kidney injury/ATN from contrast-induced nephropathy -secondary to AngioJet thrombolysis, venogram 2/21 and CT angiogram of the chest on admission, kidney function was normal prior to that -Non Oliguric ATN -nephrology consult appreciated -Unfortunately creatinine continues to rise, continue IVF, brisk urine output thankfully  Left upper chest small knot -likely hematoma from recent procedure and anticoagulation -if worsens will hold heparin -will check [place] to determine size  Acute on chronic dysphagia /esophageal stricture -chronic problem, worse with cold liquids  -Barium esophagram concerning for possible stricture, able to tolerate current  diet -Would benefit from GI workup pending improvement in kidney function, has been seen and scoped by Dr. Loreta Ave in the past, will defer this to outpatient  Hx of seizures  Asymptomatic  Continue Topomax   HLD  Continue Zocor   Depression and Anxiety  Continue home medications, doses lowered for GFR -xananx PRN  DVT prophylaxis: Heparin ggt  Code Status: Full Code  Family Communication: None at bedside  Disposition Plan: Home pending improvement in kidney function  Consultants:   Vascular surgery   Nephrology   Procedures:   TPA   Antimicrobials: Anti-infectives (From admission, onward)   Start     Dose/Rate Route Frequency Ordered Stop   02/02/18 1200  fluconazole (DIFLUCAN) tablet 100 mg     100 mg Oral Daily 02/02/18 1155     01/30/18 1515  clindamycin (CLEOCIN) IVPB 900 mg     900 mg 100 mL/hr over 30 Minutes Intravenous  Once 01/30/18 1510 01/30/18 1608          Objective: Vitals:   02/03/18 1000 02/03/18 2044 02/04/18 0523 02/04/18 0956  BP: (!) 148/78 (!) 150/100 (!) 145/97 (!) 150/88  Pulse:      Resp:  18 18   Temp: 98.2 F (36.8 C) 98.3 F (36.8 C) 97.9 F (36.6 C)   TempSrc: Oral Oral Oral   SpO2:      Weight:      Height:        Intake/Output Summary (Last 24 hours) at 02/04/2018 1409 Last data filed at 02/04/2018 1330 Gross per 24 hour  Intake 363 ml  Output 2350 ml  Net -1987 ml   Filed Weights   01/30/18 1056 01/31/18 1813  Weight: 107 kg (236 lb) 105.5 kg (232 lb 8 oz)    Examination: Gen: Awake, Alert, Oriented  X 3,  HEENT: PERRLA, Neck supple, no JVD Chest: small quarter sized knot on the left upper chest/tender Lungs: slightly decreased at bases CVS: RRR,No Gallops,Rubs or new Murmurs Abd: soft, Non tender, non distended, BS present Extremities: Mild L arm swelling Skin: no new rashes Psychiatry:  Mood & affect appropriate.    Data Reviewed: I have personally reviewed following labs and imaging studies  CBC: Recent  Labs  Lab 01/30/18 1248  01/31/18 0314 02/01/18 0436 02/02/18 0226 02/03/18 0306 02/04/18 0733  WBC 6.0   < > 5.8 6.1 6.9 5.7 6.4  NEUTROABS 3.5  --   --   --   --   --   --   HGB 12.1   < > 11.4* 10.4* 9.8* 8.9* 8.8*  HCT 37.9   < > 36.1 33.4* 31.9* 28.4* 28.7*  MCV 69.2*   < > 70.1* 70.2* 70.9* 71.0* 70.7*  PLT 242   < > 212 196 179 168 181   < > = values in this interval not displayed.   Basic Metabolic Panel: Recent Labs  Lab 01/31/18 0314 02/01/18 0436 02/02/18 0226 02/03/18 0306 02/04/18 0733  NA 142 142 140 139 138  K 3.3* 3.8 3.7 3.8 3.8  CL 111 110 112* 113* 112*  CO2 22 22 20* 19* 18*  GLUCOSE 106* 105* 100* 87 82  BUN 8 14 20  23* 26*  CREATININE 1.07* 2.98* 5.25* 7.18* 9.18*  CALCIUM 8.5* 8.6* 7.9* 7.9* 8.1*   GFR: Estimated Creatinine Clearance: 8.6 mL/min (A) (by C-G formula based on SCr of 9.18 mg/dL (H)). Liver Function Tests: No results for input(s): AST, ALT, ALKPHOS, BILITOT, PROT, ALBUMIN in the last 168 hours. No results for input(s): LIPASE, AMYLASE in the last 168 hours. No results for input(s): AMMONIA in the last 168 hours. Coagulation Profile: Recent Labs  Lab 01/30/18 1248  INR 0.91   Cardiac Enzymes: Recent Labs  Lab 01/30/18 1248  TROPONINI <0.03   BNP (last 3 results) No results for input(s): PROBNP in the last 8760 hours. HbA1C: No results for input(s): HGBA1C in the last 72 hours. CBG: Recent Labs  Lab 01/31/18 0834 02/01/18 0557 02/02/18 0609 02/03/18 0640 02/04/18 0612  GLUCAP 80 96 87 97 86   Lipid Profile: No results for input(s): CHOL, HDL, LDLCALC, TRIG, CHOLHDL, LDLDIRECT in the last 72 hours. Thyroid Function Tests: No results for input(s): TSH, T4TOTAL, FREET4, T3FREE, THYROIDAB in the last 72 hours. Anemia Panel: No results for input(s): VITAMINB12, FOLATE, FERRITIN, TIBC, IRON, RETICCTPCT in the last 72 hours. Sepsis Labs: No results for input(s): PROCALCITON, LATICACIDVEN in the last 168  hours.  Recent Results (from the past 240 hour(s))  Blood culture (routine x 2)     Status: None (Preliminary result)   Collection Time: 01/30/18  3:25 PM  Result Value Ref Range Status   Specimen Description   Final    BLOOD RIGHT ANTECUBITAL Performed at [facility] [place], [address] [place], [place] [address]    Special Requests   Final    BOTTLES DRAWN AEROBIC AND ANAEROBIC Blood Culture adequate volume Performed at [facility] [place], [address] [place], [place] [address]    Culture   Final    NO GROWTH 4 DAYS Performed at [facility], [address] [place], [place] [address]    Report Status PENDING  Incomplete  Blood culture (routine x 2)     Status: None (Preliminary result)  Collection Time: 01/30/18  3:30 PM  Result Value Ref Range Status   Specimen Description   Final    BLOOD BLOOD RIGHT HAND Performed at [facility], [address] [place], [place] [address]    Special Requests   Final    IN PEDIATRIC BOTTLE Blood Culture adequate volume Performed at [facility], [address] [place], [place] [address]    Culture   Final    NO GROWTH 4 DAYS Performed at [facility], [address] [place], [place] [address]    Report Status PENDING  Incomplete      Radiology Studies: US Renal  Result Date: 02/02/2018 CLINICAL DATA:  57 year old female with acute renal insufficiency elevated creatinine. EXAM: RENAL / URINARY TRACT ULTRASOUND COMPLETE COMPARISON:  None. FINDINGS: Right Kidney: Length: 10.7 cm. The right kidney is mildly echogenic. No hydronephrosis or shadowing stone Left Kidney: Length: 11.0 cm. The left kidney is mildly echogenic. No hydronephrosis or shadowing stone Bladder: Appears normal for degree of bladder distention. There is a 1.5 x 1.5 x 1.1 cm low attenuating lesion in the liver. IMPRESSION: 1. Mildly echogenic kidneys may represent underlying medical renal disease. Clinical correlation  is recommended 2. No hydronephrosis or shadowing stone. Electronically Signed   By: Elgie Collard M.D.   On: 02/02/2018 23:02      Scheduled Meds: . amLODipine  5 mg Oral Daily  . ARIPiprazole  2 mg Oral Daily  . ezetimibe  10 mg Oral Daily  . famotidine  20 mg Oral Daily  . fluconazole  100 mg Oral Daily  . gabapentin  300 mg Oral QHS  . levothyroxine  25 mcg Oral QAC breakfast  . polyethylene glycol  17 g Oral Daily  . senna-docusate  1 tablet Oral BID  . sodium chloride flush  3 mL Intravenous Q12H  . topiramate  100 mg Oral BID  . venlafaxine XR  75 mg Oral Q breakfast   Continuous Infusions: . sodium chloride    . sodium chloride 75 mL/hr at 02/03/18 1455  . heparin 1,050 Units/hr (02/04/18 1326)     LOS: 4 days    Time spent:  Zannie Cove, MD Pager: Text Page via [email or web address]   If 7PM-7AM, please contact night-coverage [email or web address] 02/04/2018, 2:09 PM

## 2018-02-04 NOTE — Progress Notes (Signed)
Subjective: Interval History: has complaints pain in upper L breast.  Objective: Vital signs in last 24 hours: Temp:  [97.9 F (36.6 C)-98.3 F (36.8 C)] 97.9 F (36.6 C) (02/25 0523) Resp:  [18] 18 (02/25 0523) BP: (145-150)/(88-100) 150/88 (02/25 0956) Weight change:   Intake/Output from previous day: 02/24 0701 - 02/25 0700 In: -  Out: 1600 [Urine:1600] Intake/Output this shift: Total I/O In: 3 [I.V.:3] Out: -   General appearance: alert, cooperative and moderately obese Resp: clear to auscultation bilaterally Cardio: S1, S2 normal and systolic murmur: holosystolic 2/6, blowing at 2nd left intercostal space GI: obese,pos bs Extremities: L arm edema,    Breast knot in upper L breast 4 cm dia. Tender, no erythema  Lab Results: Recent Labs    02/03/18 0306 02/04/18 0733  WBC 5.7 6.4  HGB 8.9* 8.8*  HCT 28.4* 28.7*  PLT 168 181   BMET:  Recent Labs    02/03/18 0306 02/04/18 0733  NA 139 138  K 3.8 3.8  CL 113* 112*  CO2 19* 18*  GLUCOSE 87 82  BUN 23* 26*  CREATININE 7.18* 9.18*  CALCIUM 7.9* 8.1*   No results for input(s): PTH in the last 72 hours. Iron Studies: No results for input(s): IRON, TIBC, TRANSFERRIN, FERRITIN in the last 72 hours.  Studies/Results: [place] Renal  Result Date: 02/02/2018 CLINICAL DATA:  57 year old female with acute renal insufficiency elevated creatinine. EXAM: RENAL / URINARY TRACT ULTRASOUND COMPLETE COMPARISON:  None. FINDINGS: Right Kidney: Length: 10.7 cm. The right kidney is mildly echogenic. No hydronephrosis or shadowing stone Left Kidney: Length: 11.0 cm. The left kidney is mildly echogenic. No hydronephrosis or shadowing stone Bladder: Appears normal for degree of bladder distention. There is a 1.5 x 1.5 x 1.1 cm low attenuating lesion in the liver. IMPRESSION: 1. Mildly echogenic kidneys may represent underlying medical renal disease. Clinical correlation is recommended 2. No hydronephrosis or shadowing stone. Electronically  Signed   By: Elgie CollardArash  Radparvar M.D.   On: 02/02/2018 23:02    I have reviewed the patient's current medications.  Assessment/Plan: 1 AKI CIN.  Nonoligurice, should improve. If not better, HD within 48 h. Vol ok, mild acidemia. 2 Clot in arm post thrombolysis 3 Hematoma L breast 4  Obesity P Anticoag, follow chem, may need HD soon  LOS: 4 days   Wanda Cellucci 02/04/2018,10:40 AM

## 2018-02-04 NOTE — Progress Notes (Addendum)
[facility]  Subjective  - New painful left chest swelling, superficial and tender to palpation.   Objective (!) 145/97 78 97.9 F (36.6 C) (Oral) 18 100%  Intake/Output Summary (Last 24 hours) at 02/04/2018 0918 Last data filed at 02/04/2018 0530 Gross per 24 hour  Intake -  Output 1100 ml  Net -1100 ml    Left arm soft with decreased edema and palpable radial pulses equal B Palpable DP pulses B without LE edema. Lungs non labored breathing.  Left chest with palpable firm edema, tenderness to palpation.   Heart RRR  Assessment/Planning: 57 y.o. female is s/p:  angiojet thrombectomy of left upper extremity for extensive dvt now with AKI  4 Days Post-Op  Heparin IV for DVT treatment Nephrology monitoring UO and Cr.  No HD indicated currently.  Will discuss new chest wall palpable tender mass with Dr. Myra GianottiBrabham.  I'm not sure this is related to her DVT.  Will order warm compresses to left chest wall.  Mosetta Pigeonmma Maureen Collins 02/04/2018 9:18 AM --  Laboratory Lab Results: Recent Labs    02/03/18 0306 02/04/18 0733  WBC 5.7 6.4  HGB 8.9* 8.8*  HCT 28.4* 28.7*  PLT 168 181   BMET Recent Labs    02/03/18 0306 02/04/18 0733  NA 139 138  K 3.8 3.8  CL 113* 112*  CO2 19* 18*  GLUCOSE 87 82  BUN 23* 26*  CREATININE 7.18* 9.18*  CALCIUM 7.9* 8.1*    COAG Lab Results  Component Value Date   INR 0.91 01/30/2018   No results found for: PTT  I agree with the above  Durene CalWells Akacia Boltz

## 2018-02-05 LAB — HEPARIN LEVEL (UNFRACTIONATED)
HEPARIN UNFRACTIONATED: 0.27 [IU]/mL — AB (ref 0.30–0.70)
HEPARIN UNFRACTIONATED: 0.23 [IU]/mL — AB (ref 0.30–0.70)
Heparin Unfractionated: 0.41 IU/mL (ref 0.30–0.70)

## 2018-02-05 LAB — RENAL FUNCTION PANEL
Albumin: 2.8 g/dL — ABNORMAL LOW (ref 3.5–5.0)
Anion gap: 8 (ref 5–15)
BUN: 34 mg/dL — AB (ref 6–20)
CO2: 18 mmol/L — AB (ref 22–32)
CREATININE: 10.29 mg/dL — AB (ref 0.44–1.00)
Calcium: 8.1 mg/dL — ABNORMAL LOW (ref 8.9–10.3)
Chloride: 112 mmol/L — ABNORMAL HIGH (ref 101–111)
GFR calc non Af Amer: 4 mL/min — ABNORMAL LOW (ref >60)
GFR, EST AFRICAN AMERICAN: 4 mL/min — AB (ref >60)
Glucose, Bld: 93 mg/dL (ref 65–99)
Phosphorus: 5.1 mg/dL — ABNORMAL HIGH (ref 2.5–4.6)
Potassium: 4.2 mmol/L (ref 3.5–5.1)
SODIUM: 138 mmol/L (ref 135–145)

## 2018-02-05 LAB — CBC
HCT: 27.2 % — ABNORMAL LOW (ref 36.0–46.0)
HEMOGLOBIN: 8.6 g/dL — AB (ref 12.0–15.0)
MCH: 22.1 pg — AB (ref 26.0–34.0)
MCHC: 31.6 g/dL (ref 30.0–36.0)
MCV: 69.9 fL — ABNORMAL LOW (ref 78.0–100.0)
Platelets: 172 10*3/uL (ref 150–400)
RBC: 3.89 MIL/uL (ref 3.87–5.11)
RDW: 16.8 % — ABNORMAL HIGH (ref 11.5–15.5)
WBC: 6.7 10*3/uL (ref 4.0–10.5)

## 2018-02-05 LAB — GLUCOSE, CAPILLARY: GLUCOSE-CAPILLARY: 66 mg/dL (ref 65–99)

## 2018-02-05 NOTE — Progress Notes (Signed)
Subjective: Interval History: has complaints another bleed in L thigh No N, V and good apetitie.  Objective: Vital signs in last 24 hours: Temp:  [97.8 F (36.6 C)-98.3 F (36.8 C)] 97.8 F (36.6 C) (02/26 0439) Pulse Rate:  [70-86] 75 (02/26 0439) Resp:  [17-18] 17 (02/26 0439) BP: (146-150)/(79-88) 147/79 (02/26 0439) SpO2:  [84 %-100 %] 100 % (02/26 0439) Weight change:   Intake/Output from previous day: 02/25 0701 - 02/26 0700 In: 723 [P.O.:720; I.V.:3] Out: 2351 [Urine:2350; Stool:1] Intake/Output this shift: Total I/O In: 360 [P.O.:360] Out: -   General appearance: alert, cooperative, no distress and moderately obese Resp: clear to auscultation bilaterally Cardio: regular rate and rhythm, S1, S2 normal, no murmur, click, rub or gallop GI: obese, pos bs, soft Extremities: Larm more swollen than R.  knot sup portion L breast, and post L thigh  Lab Results: Recent Labs    02/04/18 0733 02/05/18 0227  WBC 6.4 6.7  HGB 8.8* 8.6*  HCT 28.7* 27.2*  PLT 181 172   BMET:  Recent Labs    02/04/18 0733 02/05/18 0227  NA 138 138  K 3.8 4.2  CL 112* 112*  CO2 18* 18*  GLUCOSE 82 93  BUN 26* 34*  CREATININE 9.18* 10.29*  CALCIUM 8.1* 8.1*   No results for input(s): PTH in the last 72 hours. Iron Studies: No results for input(s): IRON, TIBC, TRANSFERRIN, FERRITIN in the last 72 hours.  Studies/Results: No results found.  I have reviewed the patient's current medications.  Assessment/Plan: 1 AKI Cr rising yet. Acid/base stable, vol ok. . Not uremic 2 Clot L arm 3 Bruising 4 Obesity P Hold off dialysis yet.  Follow chem, vol,     LOS: 5 days   Fayrene FearingJames Maddox Bratcher 02/05/2018,9:46 AM

## 2018-02-05 NOTE — Progress Notes (Signed)
ANTICOAGULATION CONSULT NOTE - Follow Up Consult  Pharmacy Consult for heparin Indication: DVT  Labs: Recent Labs    02/03/18 0306  02/04/18 0048 02/04/18 0733 02/05/18 0227  HGB 8.9*  --   --  8.8* 8.6*  HCT 28.4*  --   --  28.7* 27.2*  PLT 168  --   --  181 172  HEPARINUNFRC 0.23*   < > 0.36 0.34 0.27*  CREATININE 7.18*  --   --  9.18*  --    < > = values in this interval not displayed.    Assessment: 57yo female now subtherapeutic on heparin after two levels at goal though had been trending down; RN notes no gtt issues or signs of bleeding; has been difficult to achieve and maintain therapeutic levels in this pt.  Goal of Therapy:  Heparin level 0.3-0.7 units/ml   Plan:  Will increase heparin gtt slightly to 1100 units/hr and check level in 8 hours.    Vernard GamblesVeronda Thorin Starner, PharmD, BCPS  02/05/2018,3:50 AM

## 2018-02-05 NOTE — Progress Notes (Addendum)
PROGRESS NOTE [facility]   Heather Hanson   ZOX:[id number] DOB: 01/11/1961  DOA: 01/30/2018 PCP: Macy Mis, MD   Brief Narrative:  Heather Hanson is a 57 year old female with medical history significant for hyperlipidemia, GERD, hypothyroidism, depression and anxiety who presented to the emergency department complaining of left upper arm pain and swelling.  Upon ED evaluation patient was found to have left upper arm swelling venous Doppler showed deep venous thrombosis involving the brachial veins in the subclavians with a thrombus extending.  Also positive for superficial venous thrombosis involving the left basilic vein.  Vascular surgery was consulted and recommended admission for possible TPA.  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 -Vascular surgery following, patient underwent angioJet thrombolysis and thrombectomy 2/21 -Continue IV heparin, transition to oral anticoagulation only when certain that no further procedures needed, if has more active hematuria will hold heparin for 6-8 hours -Risk factor for DVT unclear at this time -She has a history of thalassemia which which could potentially increase viscosity of blood  Acute kidney injury/ATN from contrast-induced nephropathy -secondary to AngioJet thrombolysis, venogram on 2/21 and CT angiogram of the chest on admission, kidney function was normal prior to that -Non Oliguric ATN -nephrology following -Unfortunately creatinine continues to rise, but very good urine output, no uremia symptoms or volume overload continue IVF, brisk urine output thankfully -expect her to turn around, no indications for HD yet  Left upper chest/Breast small hematoma -likely hematoma from recent procedure and anticoagulation -if worsens will hold heparin, unchanged from yesterday am -[place]  completed yesterday to get dimension, results pending  Anemia -primarily due to hemodilution, she's been on IVF for 4days now -stable, monitor  Acute on chronic dysphagia /esophageal stricture -chronic problem, worse with cold liquids  -Barium esophagram concerning for possible stricture, able to tolerate current diet -Would benefit from GI workup pending improvement in kidney function, has been seen and scoped by Dr. Loreta Ave in the past, will defer this to outpatient  Hx of seizures  Asymptomatic  Continue Topomax   HLD  Continue Zocor   Depression and Anxiety  Continue home medications, doses lowered for GFR -xananx PRN  DVT prophylaxis: Heparin ggt  Code Status: Full Code  Family Communication: None at bedside  Disposition Plan: Home pending improvement in kidney function  Consultants:   Vascular surgery   Nephrology   Procedures:   TPA   Antimicrobials: Anti-infectives (From admission, onward)   Start     Dose/Rate Route Frequency Ordered Stop   02/02/18 1200  fluconazole (DIFLUCAN) tablet 100 mg     100 mg Oral Daily 02/02/18 1155     01/30/18 1515  clindamycin (CLEOCIN) IVPB 900 mg     900 mg 100 mL/hr over 30 Minutes Intravenous  Once 01/30/18 1510 01/30/18 1608          Objective: Vitals:   02/04/18 0956 02/04/18 1704 02/04/18 1935 02/05/18 0439  BP: (!) 150/88 (!) 150/80 (!) 146/84 (!) 147/79  Pulse: 86 79 70 75  Resp: 18 18 18 17   Temp: 98.1 F (36.7 C) 98 F (36.7 C) 98.3 F (36.8 C) 97.8 F (36.6 C)  TempSrc: Oral Oral Oral Oral  SpO2: 100% 98% (!) 84% 100%  Weight:      Height:        Intake/Output Summary (Last 24 hours) at 02/05/2018  1355 Last data filed at 02/05/2018 0730 Gross per 24 hour  Intake 720 ml  Output 1101 ml  Net -381 ml   Filed Weights   01/30/18 1056 01/31/18 1813  Weight: 107 kg (236 lb) 105.5 kg (232 lb 8 oz)    Examination: Gen: Awake, Alert, Oriented X 3, no distress HEENT: PERRLA, Neck supple, no  JVD Lungs: CTAB Chest: small quarter sized oval mass in left upper breast/chest slightly tender-unchanged from 2/25 CVS: RRR,No Gallops,Rubs or new Murmurs Abd: soft, Non tender, non distended, BS present Extremities: mild left arm swelling Skin: no new rashes Psychiatry:  Mood & affect appropriate.    Data Reviewed: I have personally reviewed following labs and imaging studies  CBC: Recent Labs  Lab 01/30/18 1248  02/01/18 0436 02/02/18 0226 02/03/18 0306 02/04/18 0733 02/05/18 0227  WBC 6.0   < > 6.1 6.9 5.7 6.4 6.7  NEUTROABS 3.5  --   --   --   --   --   --   HGB 12.1   < > 10.4* 9.8* 8.9* 8.8* 8.6*  HCT 37.9   < > 33.4* 31.9* 28.4* 28.7* 27.2*  MCV 69.2*   < > 70.2* 70.9* 71.0* 70.7* 69.9*  PLT 242   < > 196 179 168 181 172   < > = values in this interval not displayed.   Basic Metabolic Panel: Recent Labs  Lab 02/01/18 0436 02/02/18 0226 02/03/18 0306 02/04/18 0733 02/05/18 0227  NA 142 140 139 138 138  K 3.8 3.7 3.8 3.8 4.2  CL 110 112* 113* 112* 112*  CO2 22 20* 19* 18* 18*  GLUCOSE 105* 100* 87 82 93  BUN 14 20 23* 26* 34*  CREATININE 2.98* 5.25* 7.18* 9.18* 10.29*  CALCIUM 8.6* 7.9* 7.9* 8.1* 8.1*  PHOS  --   --   --   --  5.1*   GFR: Estimated Creatinine Clearance: 7.7 mL/min (A) (by C-G formula based on SCr of 10.29 mg/dL (H)). Liver Function Tests: Recent Labs  Lab 02/05/18 0227  ALBUMIN 2.8*   No results for input(s): LIPASE, AMYLASE in the last 168 hours. No results for input(s): AMMONIA in the last 168 hours. Coagulation Profile: Recent Labs  Lab 01/30/18 1248  INR 0.91   Cardiac Enzymes: Recent Labs  Lab 01/30/18 1248  TROPONINI <0.03   BNP (last 3 results) No results for input(s): PROBNP in the last 8760 hours. HbA1C: No results for input(s): HGBA1C in the last 72 hours. CBG: Recent Labs  Lab 02/01/18 0557 02/02/18 0609 02/03/18 0640 02/04/18 0612 02/05/18 0621  GLUCAP 96 87 97 86 66   Lipid Profile: No results for  input(s): CHOL, HDL, LDLCALC, TRIG, CHOLHDL, LDLDIRECT in the last 72 hours. Thyroid Function Tests: No results for input(s): TSH, T4TOTAL, FREET4, T3FREE, THYROIDAB in the last 72 hours. Anemia Panel: No results for input(s): VITAMINB12, FOLATE, FERRITIN, TIBC, IRON, RETICCTPCT in the last 72 hours. Sepsis Labs: No results for input(s): PROCALCITON, LATICACIDVEN in the last 168 hours.  Recent Results (from the past 240 hour(s))  Blood culture (routine x 2)     Status: None   Collection Time: 01/30/18  3:25 PM  Result Value Ref Range Status   Specimen Description   Final    BLOOD RIGHT ANTECUBITAL Performed at [facility], [address] [place], [place] [address]    Special Requests   Final    BOTTLES DRAWN AEROBIC AND ANAEROBIC Blood Culture adequate volume Performed  at [facility] [place], [address] [place], [place] [address]    Culture   Final    NO GROWTH 5 DAYS Performed at [facility], [address] [place], [place] [address]    Report Status 02/04/2018 FINAL  Final  Blood culture (routine x 2)     Status: None   Collection Time: 01/30/18  3:30 PM  Result Value Ref Range Status   Specimen Description   Final    BLOOD BLOOD RIGHT HAND Performed at [facility] [place], [address] [place], [place] [address]    Special Requests   Final    IN PEDIATRIC BOTTLE Blood Culture adequate volume Performed at [facility] [place], [address] [place], [place] [address]    Culture   Final    NO GROWTH 5 DAYS Performed at [facility], [address] [place], [place] [address]    Report Status 02/04/2018 FINAL  Final      Radiology Studies: No results found.    Scheduled Meds: . amLODipine  5 mg Oral Daily  . ARIPiprazole  2 mg Oral Daily  . ezetimibe  10 mg Oral Daily  . famotidine  20 mg Oral Daily  . fluconazole  100 mg Oral Daily  . gabapentin  300 mg Oral QHS  . levothyroxine  25 mcg Oral QAC breakfast  .  polyethylene glycol  17 g Oral Daily  . senna-docusate  1 tablet Oral BID  . sodium chloride flush  3 mL Intravenous Q12H  . topiramate  100 mg Oral BID  . venlafaxine XR  75 mg Oral Q breakfast   Continuous Infusions: . sodium chloride    . sodium chloride 75 mL/hr at 02/03/18 1455  . heparin 1,250 Units/hr (02/05/18 1321)     LOS: 5 days    Time spent: 25min  Heather CovePreetha Jaquaveon Bilal, MD Pager: Text Page via [email or web address]   If 7PM-7AM, please contact night-coverage [email or web address] 02/05/2018, 1:55 PM

## 2018-02-05 NOTE — Progress Notes (Signed)
ANTICOAGULATION CONSULT NOTE - Follow Up Consult  Pharmacy Consult for Heparin Indication: DVT  Allergies  Allergen Reactions  . Aspirin Other (See Comments)    Per Dr  . Augmentin [Amoxicillin-Pot Clavulanate] Other (See Comments)    Blisters in the back of throat  . Dexilant [Dexlansoprazole] Other (See Comments)    Bowel urgency and liquid stools  . Hydrocodone-Acetaminophen Hives    REACTION: Rash  . Influenza Vaccines Swelling    Arm swelled to the size of a grapefruit  . Levsin [Hyoscyamine Sulfate] Other (See Comments)    Projectile vomitting  . Linzess [Linaclotide] Other (See Comments)    Bowel urgency and liquid stools  . Promethazine Nausea And Vomiting  . Propoxyphene N-Acetaminophen Itching    REACTION: rash  . Statins Other (See Comments)    myalgia  . Acetaminophen Itching and Rash  . Latex Itching and Rash    Rash-looks like she has the measles per pt  . Oxycodone-Acetaminophen Itching and Rash    REACTION: Rash    Patient Measurements: Height: 5\' 9"  (175.3 cm) Weight: 232 lb 8 oz (105.5 kg) IBW/kg (Calculated) : 66.2 Heparin Dosing Weight:  89.5 kg  Vital Signs: Temp: 98.2 F (36.8 C) (02/26 1951) Temp Source: Oral (02/26 1951) BP: 134/72 (02/26 1951) Pulse Rate: 81 (02/26 1811)  Labs: Recent Labs    02/03/18 0306  02/04/18 0733 02/05/18 0227 02/05/18 1150 02/05/18 1945  HGB 8.9*  --  8.8* 8.6*  --   --   HCT 28.4*  --  28.7* 27.2*  --   --   PLT 168  --  181 172  --   --   HEPARINUNFRC 0.23*   < > 0.34 0.27* 0.23* 0.41  CREATININE 7.18*  --  9.18* 10.29*  --   --    < > = values in this interval not displayed.    Estimated Creatinine Clearance: 7.7 mL/min (A) (by C-G formula based on SCr of 10.29 mg/dL (H)).   Assessment:  Anticoag: heparin for extensive DVT of unknown etiology, s/p lysis 2/21. Hgb 8.6, Plts 172 stable. HL 0.41 at goal on heparin drip 1250 uts/hr.  C/o another bleed in L thigh and new mass on the back of L leg  (some bruising and a small hematoma on the back of her left leg). Some hematuria noted 2/25. RN reports no problems with the infusion or worsening hematuria. -2/21 12:30: Angiojet thrombectomy w/ Alteplase  Goal of Therapy:  Heparin level 0.3-0.7 units/ml Monitor platelets by anticoagulation protocol: Yes   Plan:  - Heparin 1250 units/hr increase -Heparin level daily wth CBC daily   Leota SauersLisa Emin Foree Pharm.D. CPP, BCPS Clinical Pharmacist [phone] 02/05/2018 8:53 PM

## 2018-02-05 NOTE — Progress Notes (Addendum)
Progress Note    02/05/2018 7:42 AM 5 Days Post-Op  Subjective:  Wants to see her pictures on the computer; c/o new mass on the back of her left leg  Afebrile HR 70's-80's NSR 140's-150's systolic 100% RA  Vitals:   02/04/18 1935 02/05/18 0439  BP: (!) 146/84 (!) 147/79  Pulse: 70 75  Resp: 18 17  Temp: 98.3 F (36.8 C) 97.8 F (36.6 C)  SpO2: (!) 84% 100%    Physical Exam: Cardiac:  regular Lungs:  Non labored Extremities:  Left arm soft, non tender; minimal swelling;  Bruising on the back of the left thigh with small hematoma   CBC    Component Value Date/Time   WBC 6.7 02/05/2018 0227   RBC 3.89 02/05/2018 0227   HGB 8.6 (L) 02/05/2018 0227   HGB 11.7 07/06/2014 1504   HGB 11.7 07/01/2009 0910   HCT 27.2 (L) 02/05/2018 0227   HCT 36.8 07/06/2014 1504   HCT 36.4 07/01/2009 0910   PLT 172 02/05/2018 0227   PLT 204 07/06/2014 1504   PLT 190 07/01/2009 0910   MCV 69.9 (L) 02/05/2018 0227   MCV 72 (L) 07/06/2014 1504   MCV 70.7 (L) 07/01/2009 0910   MCH 22.1 (L) 02/05/2018 0227   MCHC 31.6 02/05/2018 0227   RDW 16.8 (H) 02/05/2018 0227   RDW 14.8 07/06/2014 1504   RDW 15.0 (H) 07/01/2009 0910   LYMPHSABS 1.7 01/30/2018 1248   LYMPHSABS 1.4 07/06/2014 1504   LYMPHSABS 1.0 07/01/2009 0910   MONOABS 0.5 01/30/2018 1248   MONOABS 0.4 07/01/2009 0910   EOSABS 0.3 01/30/2018 1248   EOSABS 0.2 07/06/2014 1504   BASOSABS 0.0 01/30/2018 1248   BASOSABS 0.0 07/06/2014 1504   BASOSABS 0.0 07/01/2009 0910    BMET    Component Value Date/Time   NA 138 02/05/2018 0227   NA 143 06/09/2013 1118   K 4.2 02/05/2018 0227   K 3.7 06/09/2013 1118   CL 112 (H) 02/05/2018 0227   CL 108 06/09/2013 1118   CO2 18 (L) 02/05/2018 0227   CO2 27 06/09/2013 1118   GLUCOSE 93 02/05/2018 0227   GLUCOSE 97 06/09/2013 1118   BUN 34 (H) 02/05/2018 0227   BUN 12 06/09/2013 1118   CREATININE 10.29 (H) 02/05/2018 0227   CREATININE 1.09 03/09/2014 0745   CALCIUM 8.1 (L)  02/05/2018 0227   CALCIUM 9.6 06/09/2013 1118   GFRNONAA 4 (L) 02/05/2018 0227   GFRAA 4 (L) 02/05/2018 0227    INR    Component Value Date/Time   INR 0.91 01/30/2018 1248     Intake/Output Summary (Last 24 hours) at 02/05/2018 0742 Last data filed at 02/05/2018 0048 Gross per 24 hour  Intake 723 ml  Output 2351 ml  Net -1628 ml     Assessment:  57 y.o. female is s/p:  angiojet thrombectomy of left upper extremity for extensive dvt now with AKI  5 Days Post-Op  Plan: -pt swelling of LUA is improved -small mass on left chest-u/s performed yesterday but not resulted.  She does have some bruising and a small hematoma on the back of her left leg-most likely related to the heparin.  -warm compresses to chest wall mass -creatinine continues to worsen-still making urine. Renal following. -hgb drifted down slightly over the past 3 days but overall stable and tolerating -pt wanting to see xray's -I've shown her the cervical xray's that she was wanting to see.    Doreatha Massed, PA-C [facility] [phone] 02/05/2018 7:42 AM  I agree with the above.  I have seen and examined hte patient.  Continue current care.  I spoke with Renal, and there is no indication for HD as of yet.  Continue hydration   Wells Brabham

## 2018-02-05 NOTE — Progress Notes (Signed)
ANTICOAGULATION CONSULT NOTE - Follow Up Consult  Pharmacy Consult for Heparin Indication: DVT  Allergies  Allergen Reactions  . Aspirin Other (See Comments)    Per Dr  . Augmentin [Amoxicillin-Pot Clavulanate] Other (See Comments)    Blisters in the back of throat  . Dexilant [Dexlansoprazole] Other (See Comments)    Bowel urgency and liquid stools  . Hydrocodone-Acetaminophen Hives    REACTION: Rash  . Influenza Vaccines Swelling    Arm swelled to the size of a grapefruit  . Levsin [Hyoscyamine Sulfate] Other (See Comments)    Projectile vomitting  . Linzess [Linaclotide] Other (See Comments)    Bowel urgency and liquid stools  . Promethazine Nausea And Vomiting  . Propoxyphene N-Acetaminophen Itching    REACTION: rash  . Statins Other (See Comments)    myalgia  . Acetaminophen Itching and Rash  . Latex Itching and Rash    Rash-looks like she has the measles per pt  . Oxycodone-Acetaminophen Itching and Rash    REACTION: Rash    Patient Measurements: Height: 5\' 9"  (175.3 cm) Weight: 232 lb 8 oz (105.5 kg) IBW/kg (Calculated) : 66.2 Heparin Dosing Weight:  89.5 kg  Vital Signs: Temp: 97.8 F (36.6 C) (02/26 0439) Temp Source: Oral (02/26 0439) BP: 147/79 (02/26 0439) Pulse Rate: 75 (02/26 0439)  Labs: Recent Labs    02/03/18 0306  02/04/18 0733 02/05/18 0227 02/05/18 1150  HGB 8.9*  --  8.8* 8.6*  --   HCT 28.4*  --  28.7* 27.2*  --   PLT 168  --  181 172  --   HEPARINUNFRC 0.23*   < > 0.34 0.27* 0.23*  CREATININE 7.18*  --  9.18* 10.29*  --    < > = values in this interval not displayed.    Estimated Creatinine Clearance: 7.7 mL/min (A) (by C-G formula based on SCr of 10.29 mg/dL (H)).   Assessment:  Anticoag: heparin for extensive DVT of unknown etiology, s/p lysis 2/21. Hgb 8.6, Plts 172 stable. HL 0.27 this AM and now 0.23 lower on repeat. C/o another bleed in L thigh and new mass on the back of L leg (some bruising and a small hematoma on the  back of her left leg). Some hematuria noted 2/25. RN reports no problems with the infusion or worsening hematuria. -2/21 12:30: Angiojet thrombectomy w/ Alteplase  Goal of Therapy:  Heparin level 0.3-0.7 units/ml Monitor platelets by anticoagulation protocol: Yes   Plan:  - Heparin 1250 units/hr increase -Heparin level daily wth CBC daily - Recheck heparin level 6 hrs after current rate increase   Nicandro Perrault S. Merilynn Finlandobertson, PharmD, BCPS Clinical Staff Pharmacist Pager [phone]  Misty Stanleyobertson, Jaiyla Granados Stillinger 02/05/2018,1:21 PM

## 2018-02-06 ENCOUNTER — Inpatient Hospital Stay (HOSPITAL_COMMUNITY): Payer: Medicaid Other

## 2018-02-06 HISTORY — PX: IR US GUIDE VASC ACCESS RIGHT: IMG2390

## 2018-02-06 HISTORY — PX: IR FLUORO GUIDE CV LINE RIGHT: IMG2283

## 2018-02-06 LAB — CBC
HEMATOCRIT: 26.9 % — AB (ref 36.0–46.0)
HEMOGLOBIN: 8.5 g/dL — AB (ref 12.0–15.0)
MCH: 22.3 pg — AB (ref 26.0–34.0)
MCHC: 31.6 g/dL (ref 30.0–36.0)
MCV: 70.4 fL — AB (ref 78.0–100.0)
Platelets: 170 10*3/uL (ref 150–400)
RBC: 3.82 MIL/uL — ABNORMAL LOW (ref 3.87–5.11)
RDW: 17.3 % — ABNORMAL HIGH (ref 11.5–15.5)
WBC: 5.8 10*3/uL (ref 4.0–10.5)

## 2018-02-06 LAB — RENAL FUNCTION PANEL
ANION GAP: 11 (ref 5–15)
Albumin: 3.1 g/dL — ABNORMAL LOW (ref 3.5–5.0)
BUN: 35 mg/dL — ABNORMAL HIGH (ref 6–20)
CHLORIDE: 111 mmol/L (ref 101–111)
CO2: 17 mmol/L — AB (ref 22–32)
Calcium: 8.2 mg/dL — ABNORMAL LOW (ref 8.9–10.3)
Creatinine, Ser: 11.71 mg/dL — ABNORMAL HIGH (ref 0.44–1.00)
GFR calc non Af Amer: 3 mL/min — ABNORMAL LOW (ref >60)
GFR, EST AFRICAN AMERICAN: 4 mL/min — AB (ref >60)
Glucose, Bld: 83 mg/dL (ref 65–99)
POTASSIUM: 4.4 mmol/L (ref 3.5–5.1)
Phosphorus: 5.8 mg/dL — ABNORMAL HIGH (ref 2.5–4.6)
Sodium: 139 mmol/L (ref 135–145)

## 2018-02-06 LAB — GLUCOSE, CAPILLARY: GLUCOSE-CAPILLARY: 77 mg/dL (ref 65–99)

## 2018-02-06 LAB — HEPARIN LEVEL (UNFRACTIONATED): Heparin Unfractionated: 0.46 IU/mL (ref 0.30–0.70)

## 2018-02-06 MED ORDER — SODIUM CHLORIDE 0.9 % IV SOLN: 100.0000 mL | INTRAVENOUS | Status: DC | PRN

## 2018-02-06 MED ORDER — HEPARIN SODIUM (PORCINE) 1000 UNIT/ML IJ SOLN
INTRAMUSCULAR | Status: AC
  Filled 2018-02-06: qty 1

## 2018-02-06 MED ORDER — LIDOCAINE HCL (PF) 1 % IJ SOLN: 5.0000 mL | INTRAMUSCULAR | Status: DC | PRN

## 2018-02-06 MED ORDER — PENTAFLUOROPROP-TETRAFLUOROETH EX AERO: 1.0000 "application " | INHALATION_SPRAY | CUTANEOUS | Status: DC | PRN

## 2018-02-06 MED ORDER — ALTEPLASE 2 MG IJ SOLR: 2.0000 mg | Freq: Once | INTRAMUSCULAR | Status: DC | PRN

## 2018-02-06 MED ORDER — HEPARIN SODIUM (PORCINE) 1000 UNIT/ML DIALYSIS
40.0000 [IU]/kg | Freq: Once | INTRAMUSCULAR | Status: DC
  Filled 2018-02-06: qty 5

## 2018-02-06 MED ORDER — LIDOCAINE HCL (PF) 1 % IJ SOLN
INTRAMUSCULAR | Status: DC | PRN
  Administered 2018-02-06: 5 mL

## 2018-02-06 MED ORDER — LIDOCAINE HCL 1 % IJ SOLN
INTRAMUSCULAR | Status: AC
  Filled 2018-02-06: qty 20

## 2018-02-06 MED ORDER — LIDOCAINE-PRILOCAINE 2.5-2.5 % EX CREA: 1.0000 "application " | TOPICAL_CREAM | CUTANEOUS | Status: DC | PRN

## 2018-02-06 MED ORDER — HEPARIN SODIUM (PORCINE) 1000 UNIT/ML DIALYSIS
1000.0000 [IU] | INTRAMUSCULAR | Status: DC | PRN
  Administered 2018-02-07: 1000 [IU] via INTRAVENOUS_CENTRAL
  Filled 2018-02-06: qty 1

## 2018-02-06 NOTE — Progress Notes (Signed)
PROGRESS NOTE [facility]   Heather Hanson   ZOX:[id number] DOB: [birth year]  DOA: 01/30/2018 PCP: Macy Mis, MD   Brief Narrative:  Heather Hanson is a 57 year old female with medical history significant for hyperlipidemia, GERD, hypothyroidism, depression and anxiety who presented to the emergency department complaining of left upper arm pain and swelling.  Upon ED evaluation patient was found to have left upper arm swelling venous Doppler showed deep venous thrombosis involving the brachial veins in the subclavians with a thrombus extending.  Also positive for superficial venous thrombosis involving the left basilic vein.  Vascular surgery was consulted and recommended admission for possible TPA.  Subjective: No new complaints reported to me today.   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   Acute kidney injury/ATN from contrast-induced nephropathy -secondary to AngioJet thrombolysis, venogram on 2/21 and CT angiogram of the chest on admission, kidney function was normal prior to that -Non Oliguric ATN -nephrology managing  Left upper chest/Breast small hematoma -likely hematoma from recent procedure and anticoagulation  Anemia -primarily due to hemodilution -stable, continue to monitor  Acute on chronic dysphagia /esophageal stricture -chronic problem, worse with cold liquids  -Barium esophagram concerning for possible stricture, able to tolerate current diet -Would  benefit from GI workup pending improvement in kidney function, has been seen and scoped by Dr. Loreta Ave in the past, will defer this to outpatient  Hx of seizures  Asymptomatic  Continue Topomax   HLD  Continue Zocor   Depression and Anxiety  Continue home medications, doses lowered for GFR -xananx PRN  DVT prophylaxis: Heparin ggt  Code Status: Full Code  Family Communication: None at bedside  Disposition Plan: Home pending improvement in kidney function  Consultants:   Vascular surgery   Nephrology   Procedures:   TPA   Antimicrobials: Anti-infectives (From admission, onward)   Start     Dose/Rate Route Frequency Ordered Stop   02/02/18 1200  fluconazole (DIFLUCAN) tablet 100 mg     100 mg Oral Daily 02/02/18 1155     01/30/18 1515  clindamycin (CLEOCIN) IVPB 900 mg     900 mg 100 mL/hr over 30 Minutes Intravenous  Once 01/30/18 1510 01/30/18 1608          Objective: Vitals:   02/05/18 1811 02/05/18 1951 02/06/18 0512 02/06/18 1208  BP: 138/73 134/72 (!) 149/82 (!) 141/78  Pulse: 81     Resp: 18 16 18 18   Temp: 97.9 F (36.6 C) 98.2 F (36.8 C) 97.8 F (36.6 C)   TempSrc: Oral Oral Oral Oral  SpO2: 100% 100% 100% 100%  Weight:      Height:        Intake/Output Summary (Last 24 hours) at 02/06/2018 1825 Last data filed at 02/06/2018 1557 Gross per 24 hour  Intake 1849.79 ml  Output 1300 ml  Net 549.79 ml   Filed Weights   01/30/18 1056 01/31/18 1813  Weight: 107 kg (236 lb) 105.5 kg (232 lb 8 oz)    Examination: Gen: Awake, Alert, Oriented X  3, no distress HEENT: PERRLA, Neck supple, no JVD Lungs: CTAB, equal chest rise. Chest: small quarter sized oval mass in left upper breast/chest slightly tender-unchanged from 2/25 CVS: RRR,No Gallops,Rubs or new Murmurs Abd: soft, Non tender, non distended, BS present Extremities: mild left arm swelling Skin: no new rashes Psychiatry:  Mood & affect appropriate.    Data Reviewed: I have personally  reviewed following labs and imaging studies  CBC: Recent Labs  Lab 02/02/18 0226 02/03/18 0306 02/04/18 0733 02/05/18 0227 02/06/18 0246  WBC 6.9 5.7 6.4 6.7 5.8  HGB 9.8* 8.9* 8.8* 8.6* 8.5*  HCT 31.9* 28.4* 28.7* 27.2* 26.9*  MCV 70.9* 71.0* 70.7* 69.9* 70.4*  PLT 179 168 181 172 170   Basic Metabolic Panel: Recent Labs  Lab 02/02/18 0226 02/03/18 0306 02/04/18 0733 02/05/18 0227 02/06/18 0246  NA 140 139 138 138 139  K 3.7 3.8 3.8 4.2 4.4  CL 112* 113* 112* 112* 111  CO2 20* 19* 18* 18* 17*  GLUCOSE 100* 87 82 93 83  BUN 20 23* 26* 34* 35*  CREATININE 5.25* 7.18* 9.18* 10.29* 11.71*  CALCIUM 7.9* 7.9* 8.1* 8.1* 8.2*  PHOS  --   --   --  5.1* 5.8*   GFR: Estimated Creatinine Clearance: 6.8 mL/min (A) (by C-G formula based on SCr of 11.71 mg/dL (H)). Liver Function Tests: Recent Labs  Lab 02/05/18 0227 02/06/18 0246  ALBUMIN 2.8* 3.1*   No results for input(s): LIPASE, AMYLASE in the last 168 hours. No results for input(s): AMMONIA in the last 168 hours. Coagulation Profile: No results for input(s): INR, PROTIME in the last 168 hours. Cardiac Enzymes: No results for input(s): CKTOTAL, CKMB, CKMBINDEX, TROPONINI in the last 168 hours. BNP (last 3 results) No results for input(s): PROBNP in the last 8760 hours. HbA1C: No results for input(s): HGBA1C in the last 72 hours. CBG: Recent Labs  Lab 02/02/18 0609 02/03/18 0640 02/04/18 0612 02/05/18 0621 02/06/18 0609  GLUCAP 87 97 86 66 77   Lipid Profile: No results for input(s): CHOL, HDL, LDLCALC, TRIG, CHOLHDL, LDLDIRECT in the last 72 hours. Thyroid Function Tests: No results for input(s): TSH, T4TOTAL, FREET4, T3FREE, THYROIDAB in the last 72 hours. Anemia Panel: No results for input(s): VITAMINB12, FOLATE, FERRITIN, TIBC, IRON, RETICCTPCT in the last 72 hours. Sepsis Labs: No results for input(s): PROCALCITON, LATICACIDVEN in the last 168 hours.  Recent Results (from the past 240 hour(s))    Blood culture (routine x 2)     Status: None   Collection Time: 01/30/18  3:25 PM  Result Value Ref Range Status   Specimen Description   Final    BLOOD RIGHT ANTECUBITAL Performed at [facility], [address] [place], [place] [address]    Special Requests   Final    BOTTLES DRAWN AEROBIC AND ANAEROBIC Blood Culture adequate volume Performed at [facility], [address] [place], [place] [address]    Culture   Final    NO GROWTH 5 DAYS Performed at [facility], [address] [place], [place] [address]    Report Status 02/04/2018 FINAL  Final  Blood culture (routine x 2)     Status: None   Collection Time: 01/30/18  3:30 PM  Result Value Ref Range Status   Specimen Description   Final    BLOOD BLOOD RIGHT HAND Performed at [facility], [address] [place], [place] [address]    Special  Requests   Final    IN PEDIATRIC BOTTLE Blood Culture adequate volume Performed at [facility] [place], [address] [place], [place] [address]    Culture   Final    NO GROWTH 5 DAYS Performed at [facility], [address] [place], [place] [address]    Report Status 02/04/2018 FINAL  Final      Radiology Studies: No results found.    Scheduled Meds: . amLODipine  5 mg Oral Daily  . ARIPiprazole  2 mg Oral Daily  . ezetimibe  10 mg Oral Daily  . famotidine  20 mg Oral Daily  . fluconazole  100 mg Oral Daily  . gabapentin  300 mg Oral QHS  . heparin      . levothyroxine  25 mcg Oral QAC breakfast  . lidocaine      . polyethylene glycol  17 g Oral Daily  . senna-docusate  1 tablet Oral BID  . sodium chloride flush  3 mL Intravenous Q12H  . topiramate  100 mg Oral BID  . venlafaxine XR  75 mg Oral Q breakfast   Continuous Infusions: . sodium chloride    . sodium chloride 75 mL/hr at 02/06/18 0710  . heparin Stopped (02/06/18 1446)     LOS: 6 days    Time spent: 25 min  Penny Piarlando Bernell Sigal, MD Pager: Text Page  via [email or web address]   If 7PM-7AM, please contact night-coverage [email or web address] 02/06/2018, 6:25 PM

## 2018-02-06 NOTE — Progress Notes (Signed)
ANTICOAGULATION CONSULT NOTE - Follow Up Consult  Pharmacy Consult for Heparin Indication: DVT  Allergies  Allergen Reactions  . Aspirin Other (See Comments)    Per Dr  . Augmentin [Amoxicillin-Pot Clavulanate] Other (See Comments)    Blisters in the back of throat  . Dexilant [Dexlansoprazole] Other (See Comments)    Bowel urgency and liquid stools  . Hydrocodone-Acetaminophen Hives    REACTION: Rash  . Influenza Vaccines Swelling    Arm swelled to the size of a grapefruit  . Levsin [Hyoscyamine Sulfate] Other (See Comments)    Projectile vomitting  . Linzess [Linaclotide] Other (See Comments)    Bowel urgency and liquid stools  . Promethazine Nausea And Vomiting  . Propoxyphene N-Acetaminophen Itching    REACTION: rash  . Statins Other (See Comments)    myalgia  . Acetaminophen Itching and Rash  . Latex Itching and Rash    Rash-looks like she has the measles per pt  . Oxycodone-Acetaminophen Itching and Rash    REACTION: Rash    Patient Measurements: Height: 5\' 9"  (175.3 cm) Weight: 232 lb 8 oz (105.5 kg) IBW/kg (Calculated) : 66.2 Heparin Dosing Weight:  89.5 kg  Vital Signs: Temp: 97.8 F (36.6 C) (02/27 0512) Temp Source: Oral (02/27 0512) BP: 149/82 (02/27 0512)  Labs: Recent Labs    02/04/18 0733 02/05/18 0227 02/05/18 1150 02/05/18 1945 02/06/18 0246  HGB 8.8* 8.6*  --   --  8.5*  HCT 28.7* 27.2*  --   --  26.9*  PLT 181 172  --   --  170  HEPARINUNFRC 0.34 0.27* 0.23* 0.41 0.46  CREATININE 9.18* 10.29*  --   --  11.71*    Estimated Creatinine Clearance: 6.8 mL/min (A) (by C-G formula based on SCr of 11.71 mg/dL (H)).  Assessment:  Anticoag: heparin for extensive DVT of unknown etiology, s/p lysis 2/21. Hgb 8.5, Plts 170 stable. C/o another bleed in in L thigh and new mass on the back of L leg (some bruising and a small hematoma on the back of her left leg) another small palpable mass on the left posterior thigh as well. . Some hematuria noted  2/25 and ongoing. HL 0.46 in goal. -2/21 12:30: Angiojet thrombectomy w/ Alteplase  Goal of Therapy:  Heparin level 0.3-0.7 units/ml Monitor platelets by anticoagulation protocol: Yes   Plan:  - Heparin 1250 units/hr  - Heparin level daily wth CBC daily - Planning oral AC if ok when VVS ( needs dialysis will place tunneled cath) - Awaiting results of L chest US for chest wall mass  Jaecob Lowden S. Merilynn Finlandobertson, PharmD, BCPS Clinical Staff Pharmacist Pager [phone]  Misty Stanleyobertson, Hasini Peachey Stillinger 02/06/2018,11:21 AM

## 2018-02-06 NOTE — Progress Notes (Signed)
Patient seemed to have   02/06/18 1400  Clinical Encounter Type  Visited With Patient  Visit Type Initial;Spiritual support  Referral From Nurse  Consult/Referral To Chaplain  Spiritual Encounters  Spiritual Needs Prayer;Emotional  Stress Factors  Patient Stress Factors Other (Comment) (confusion about her medical issues)   no idea anything was going on with her kidneys. Also seemed to be in a "fog"?  Patient was concerned and seemed to have some frustration wanting to better understand what is going on.  She shared prayer concerns and I prayed. Phebe Collaonna S Sophiarose Eades, Chaplain

## 2018-02-06 NOTE — Procedures (Signed)
Temp HD cath RIJV SVC RA EBL 0 Comp 0

## 2018-02-06 NOTE — Consult Note (Signed)
   Patient Status: [facility] - In[facility]pt  Assessment and Plan: Patient in need of temporary dialysis catheter placement for acute kidney injury.  Note acute arm swelling in the left upper arm.   ______________________________________________________________________ History of Present Illness: Heather Hanson is a 57 y.o. female with history of HLD, GERD, depression and anxiety who [facility] with LUA swelling due to extensive DVT.  Also with acute kidney injury.  Request for temporary dialysis catheter placement by Dr. Darrick Pennaeterding.   Allergies and medications reviewed.   Review of Systems: A 12 point ROS discussed and pertinent positives are indicated in the HPI above.  All other systems are negative.  Review of Systems  Constitutional: Negative for fatigue and fever.  Respiratory: Negative for cough and shortness of breath.   Cardiovascular: Negative for chest pain.  Gastrointestinal: Negative for abdominal pain.  Musculoskeletal:       Arm swelling.   Psychiatric/Behavioral: Negative for behavioral problems and confusion.    Vital Signs: BP (!) 141/78 (BP Location: Right Arm)   Pulse 81   Temp 97.8 F (36.6 C) (Oral)   Resp 18   Ht 5\' 9"  (1.753 m)   Wt 232 lb 8 oz (105.5 kg)   SpO2 100%   BMI 34.33 kg/m   Physical Exam  Constitutional: She appears well-developed.  Cardiovascular: Normal rate, regular rhythm and normal heart sounds.  Pulmonary/Chest: Effort normal and breath sounds normal. No respiratory distress.  Skin: Skin is warm and dry.  Psychiatric: She has a normal mood and affect. Her behavior is normal. Judgment and thought content normal.  Nursing note and vitals reviewed.    Imaging reviewed.   Labs:  COAGS: Recent Labs    01/30/18 1248  INR 0.91    BMP: Recent Labs    02/03/18 0306 02/04/18 0733 02/05/18 0227 02/06/18 0246  NA 139 138 138 139  K 3.8 3.8 4.2 4.4  CL 113* 112* 112* 111  CO2 19* 18* 18* 17*  GLUCOSE 87 82 93 83  BUN  23* 26* 34* 35*  CALCIUM 7.9* 8.1* 8.1* 8.2*  CREATININE 7.18* 9.18* 10.29* 11.71*  GFRNONAA 6* 4* 4* 3*  GFRAA 7* 5* 4* 4*       Electronically Signed: Hoyt KochKacie Sue-Ellen Darel Ricketts, PA 02/06/2018, 3:29 PM   I spent a total of 15 minutes in face to face in clinical consultation, greater than 50% of which was counseling/coordinating care for venous access.

## 2018-02-06 NOTE — Progress Notes (Signed)
02/06/2018 8:14 PM Okay to restart IV Heparin per Dr. Darrick PennaFields.  Heparin restarted at 12.5cc/hr at previous rate. Kathryne HitchAllen, Heyli Min C

## 2018-02-06 NOTE — Progress Notes (Addendum)
Progress Note    02/06/2018 7:44 AM 6 Days Post-Op  Subjective:  Says she is starting to feel depressed and just doesn't feel good overall today.  Says she has more bumps that have come up.  Afebrile HR 70's-100's  130's-150's systolic 100% RA  Vitals:   02/05/18 1951 02/06/18 0512  BP: 134/72 (!) 149/82  Pulse:    Resp: 16 18  Temp: 98.2 F (36.8 C) 97.8 F (36.6 C)  SpO2: 100% 100%    Physical Exam: Cardiac:  regular Lungs:  Non labored Extremities:  Left arm is soft with palpable left radial pulse; there is a small palpable mass on the posterior thigh   CBC    Component Value Date/Time   WBC 5.8 02/06/2018 0246   RBC 3.82 (L) 02/06/2018 0246   HGB 8.5 (L) 02/06/2018 0246   HGB 11.7 07/06/2014 1504   HGB 11.7 07/01/2009 0910   HCT 26.9 (L) 02/06/2018 0246   HCT 36.8 07/06/2014 1504   HCT 36.4 07/01/2009 0910   PLT 170 02/06/2018 0246   PLT 204 07/06/2014 1504   PLT 190 07/01/2009 0910   MCV 70.4 (L) 02/06/2018 0246   MCV 72 (L) 07/06/2014 1504   MCV 70.7 (L) 07/01/2009 0910   MCH 22.3 (L) 02/06/2018 0246   MCHC 31.6 02/06/2018 0246   RDW 17.3 (H) 02/06/2018 0246   RDW 14.8 07/06/2014 1504   RDW 15.0 (H) 07/01/2009 0910   LYMPHSABS 1.7 01/30/2018 1248   LYMPHSABS 1.4 07/06/2014 1504   LYMPHSABS 1.0 07/01/2009 0910   MONOABS 0.5 01/30/2018 1248   MONOABS 0.4 07/01/2009 0910   EOSABS 0.3 01/30/2018 1248   EOSABS 0.2 07/06/2014 1504   BASOSABS 0.0 01/30/2018 1248   BASOSABS 0.0 07/06/2014 1504   BASOSABS 0.0 07/01/2009 0910    BMET    Component Value Date/Time   NA 139 02/06/2018 0246   NA 143 06/09/2013 1118   K 4.4 02/06/2018 0246   K 3.7 06/09/2013 1118   CL 111 02/06/2018 0246   CL 108 06/09/2013 1118   CO2 17 (L) 02/06/2018 0246   CO2 27 06/09/2013 1118   GLUCOSE 83 02/06/2018 0246   GLUCOSE 97 06/09/2013 1118   BUN 35 (H) 02/06/2018 0246   BUN 12 06/09/2013 1118   CREATININE 11.71 (H) 02/06/2018 0246   CREATININE 1.09 03/09/2014  0745   CALCIUM 8.2 (L) 02/06/2018 0246   CALCIUM 9.6 06/09/2013 1118   GFRNONAA 3 (L) 02/06/2018 0246   GFRAA 4 (L) 02/06/2018 0246    INR    Component Value Date/Time   INR 0.91 01/30/2018 1248     Intake/Output Summary (Last 24 hours) at 02/06/2018 0744 Last data filed at 02/06/2018 [id number] Gross per 24 hour  Intake 2689.79 ml  Output 1700 ml  Net 989.79 ml     Assessment:  57 y.o. female is s/p:  angiojet thrombectomy of left upper extremity for extensive dvt now with AKI  6 Days Post-Op   Plan: -pt's LUA swelling about the same as yesterday -pt has another small palpable mass on the left posterior thigh as well.  U/s done a couple of days ago still has not resulted-may need to call u/s to get results.  -creatinine continues to worsen-pt states she had bloody urine this am. -hgb stable at 8.5 today-tolerating. Transfuse per primary team -DVT prophylaxis:  Heparin gtt   Doreatha MassedSamantha Rhyne, PA-C [facility] [phone] 02/06/2018 7:44 AM  Spoke with IR.  They will place  Temp cath today.  Durene Cal

## 2018-02-06 NOTE — Progress Notes (Signed)
@  0630 pt found showering with IV. IV disconnected, bathing finished, and pt counseled on the need to keep IV infusing and dry. Pt assisted back to bed and bed alarm set. IV redressed, and new IV tubing and meds started.

## 2018-02-06 NOTE — Progress Notes (Signed)
Subjective: Interval History: has complaints feels miserable,  N, little urine.  Objective: Vital signs in last 24 hours: Temp:  [97.8 F (36.6 C)-98.2 F (36.8 C)] 97.8 F (36.6 C) (02/27 0512) Pulse Rate:  [81] 81 (02/26 1811) Resp:  [16-18] 18 (02/27 0512) BP: (134-149)/(72-82) 149/82 (02/27 0512) SpO2:  [100 %] 100 % (02/27 0512) Weight change:   Intake/Output from previous day: 02/26 0701 - 02/27 0700 In: 3049.8 [P.O.:1440; I.V.:1609.8] Out: 1700 [Urine:1700] Intake/Output this shift: No intake/output data recorded.  General appearance: alert, cooperative, mild distress and moderately obese Resp: clear to auscultation bilaterally Cardio: S1, S2 normal and systolic murmur: holosystolic 2/6, blowing at apex GI: obese, pos bs, soft,  Extremities: L arm more swollen than R.  hematoma L thigh  Lab Results: Recent Labs    02/05/18 0227 02/06/18 0246  WBC 6.7 5.8  HGB 8.6* 8.5*  HCT 27.2* 26.9*  PLT 172 170   BMET:  Recent Labs    02/05/18 0227 02/06/18 0246  NA 138 139  K 4.2 4.4  CL 112* 111  CO2 18* 17*  GLUCOSE 93 83  BUN 34* 35*  CREATININE 10.29* 11.71*  CALCIUM 8.1* 8.2*   No results for input(s): PTH in the last 72 hours. Iron Studies: No results for input(s): IRON, TIBC, TRANSFERRIN, FERRITIN in the last 72 hours.  Studies/Results: No results found.  I have reviewed the patient's current medications.  Assessment/Plan: 1 AKI now lower urine, acidemic , uremic needs Hd. Will get temp cath and do HD 2 Anemia 3 DVT per VVS, primary 4 Obesity P temp cath , Hd    LOS: 6 days   Heather Hanson 02/06/2018,9:19 AM

## 2018-02-07 ENCOUNTER — Encounter (HOSPITAL_COMMUNITY): Payer: Self-pay | Admitting: Interventional Radiology

## 2018-02-07 LAB — CBC
HEMATOCRIT: 26.9 % — AB (ref 36.0–46.0)
HEMOGLOBIN: 8.4 g/dL — AB (ref 12.0–15.0)
MCH: 21.5 pg — AB (ref 26.0–34.0)
MCHC: 31.2 g/dL (ref 30.0–36.0)
MCV: 69 fL — AB (ref 78.0–100.0)
Platelets: 166 10*3/uL (ref 150–400)
RBC: 3.9 MIL/uL (ref 3.87–5.11)
RDW: 16.8 % — ABNORMAL HIGH (ref 11.5–15.5)
WBC: 7.1 10*3/uL (ref 4.0–10.5)

## 2018-02-07 LAB — ALT: ALT: 47 U/L (ref 14–54)

## 2018-02-07 LAB — RENAL FUNCTION PANEL
Albumin: 2.9 g/dL — ABNORMAL LOW (ref 3.5–5.0)
Anion gap: 11 (ref 5–15)
BUN: 40 mg/dL — AB (ref 6–20)
CALCIUM: 7.6 mg/dL — AB (ref 8.9–10.3)
CHLORIDE: 112 mmol/L — AB (ref 101–111)
CO2: 16 mmol/L — ABNORMAL LOW (ref 22–32)
Creatinine, Ser: 12.26 mg/dL — ABNORMAL HIGH (ref 0.44–1.00)
GFR, EST AFRICAN AMERICAN: 3 mL/min — AB (ref >60)
GFR, EST NON AFRICAN AMERICAN: 3 mL/min — AB (ref >60)
Glucose, Bld: 85 mg/dL (ref 65–99)
PHOSPHORUS: 6.2 mg/dL — AB (ref 2.5–4.6)
Potassium: 4.3 mmol/L (ref 3.5–5.1)
Sodium: 139 mmol/L (ref 135–145)

## 2018-02-07 LAB — HEPATITIS B SURFACE ANTIBODY,QUALITATIVE: Hep B S Ab: NONREACTIVE

## 2018-02-07 LAB — HEPATITIS C ANTIBODY (REFLEX): HCV Ab: <0.1 s/co ratio (ref 0.0–0.9)

## 2018-02-07 LAB — HEPARIN LEVEL (UNFRACTIONATED)
HEPARIN UNFRACTIONATED: 1.02 [IU]/mL — AB (ref 0.30–0.70)
Heparin Unfractionated: 1.04 IU/mL — ABNORMAL HIGH (ref 0.30–0.70)
Heparin Unfractionated: 0.37 IU/mL (ref 0.30–0.70)

## 2018-02-07 LAB — GLUCOSE, CAPILLARY: GLUCOSE-CAPILLARY: 66 mg/dL (ref 65–99)

## 2018-02-07 LAB — HEPATITIS B SURFACE ANTIGEN: Hepatitis B Surface Ag: NEGATIVE

## 2018-02-07 LAB — HCV COMMENT:

## 2018-02-07 LAB — HEPATITIS B CORE ANTIBODY, IGM: HEP B C IGM: NEGATIVE

## 2018-02-07 NOTE — Progress Notes (Signed)
HD tx initiated via HD cath w/o slight cath function issue. AP, pull/push/flush well, VP: very sluggish pull, push/flush well, VSS, will cont to monitor while on HD tx

## 2018-02-07 NOTE — Progress Notes (Signed)
PROGRESS NOTE [facility]   Heather FeeGwendolyn A Miera   ZOX:[id number] DOB: 1961/01/14  DOA: 01/30/2018 PCP: Macy MisBriscoe, Kim K, MD   Brief Narrative:  Heather Hanson is a 57 year old female with medical history significant for hyperlipidemia, GERD, hypothyroidism, depression and anxiety who presented to the emergency department complaining of left upper arm pain and swelling.  Upon ED evaluation patient was found to have left upper arm swelling venous Doppler showed deep venous thrombosis involving the brachial veins in the subclavians with a thrombus extending.  Also positive for superficial venous thrombosis involving the left basilic vein.  Vascular surgery was consulted and recommended admission for possible TPA.  Subjective: Pt reports no new complaints currently.  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  - discussed over the phone with hematologist who is recommending Coumadin due to her history of Renal Failure and dialysis.   Acute kidney injury/ATN from contrast-induced nephropathy - secondary to AngioJet thrombolysis, venogram on 2/21 and CT angiogram of the chest on admission, kidney function was normal prior to that - Non Oliguric ATN - nephrology on board and managing dialysis  Left upper chest/Breast small hematoma -likely hematoma from recent procedure and anticoagulation  Anemia -primarily due to hemodilution -stable, continue to monitor  Acute on chronic dysphagia /esophageal  stricture -chronic problem, worse with cold liquids  -Barium esophagram concerning for possible stricture, able to tolerate current diet -Would benefit from GI workup pending improvement in kidney function, has been seen and scoped by Dr. Loreta AveMann in the past, will defer this to outpatient  Hx of seizures  Asymptomatic  Continue Topomax   HLD  Continue Zocor   Depression and Anxiety  Continue home medications, doses lowered for GFR -xananx PRN  DVT prophylaxis: Heparin ggt  Code Status: Full Code  Family Communication: None at bedside  Disposition Plan: Home pending improvement in kidney function  Consultants:   Vascular surgery   Nephrology   Procedures:   TPA   Antimicrobials: Anti-infectives (From admission, onward)   Start     Dose/Rate Route Frequency Ordered Stop   02/02/18 1200  fluconazole (DIFLUCAN) tablet 100 mg  Status:  Discontinued     100 mg Oral Daily 02/02/18 1155 02/07/18 0913   01/30/18 1515  clindamycin (CLEOCIN) IVPB 900 mg     900 mg 100 mL/hr over 30 Minutes Intravenous  Once 01/30/18 1510 01/30/18 1608          Objective: Vitals:   02/07/18 0257 02/07/18 0522 02/07/18 0912 02/07/18 1152  BP: (!) 182/92 (!) 148/80 (!) 149/92 (!) 159/88  Pulse: 73 77  76  Resp: 18 18  18   Temp: 97.8 F (36.6 C) 97.9 F (36.6 C)    TempSrc: Oral Oral    SpO2: 100% 100%    Weight: 112.1 kg (247 lb 2.2 oz)     Height:        Intake/Output Summary (Last 24 hours) at 02/07/2018 1625 Last data filed at 02/07/2018 0250 Gross per 24 hour  Intake -  Output 1004 ml  Net -1004 ml   Filed Weights   01/31/18 1813 02/06/18 2341 02/07/18 0257  Weight: 105.5 kg (232 lb 8 oz) 113.1 kg (249 lb 5.4 oz) 112.1 kg (247 lb 2.2 oz)    Examination: Exam unchanged when compared to 02/06/2018 Gen: Awake, Alert, Oriented X 3, no distress HEENT: PERRLA, Neck supple, no JVD Lungs: CTAB, equal chest rise. Chest: small quarter sized oval mass in left upper breast/chest  slightly tender-unchanged from 2/25 CVS: RRR,No Gallops,Rubs or new Murmurs Abd: soft, Non tender, non distended, BS present Extremities: mild left arm swelling Skin: no new rashes Psychiatry:  Mood & affect appropriate.    Data Reviewed: I have personally reviewed following labs and imaging studies  CBC: Recent Labs  Lab 02/03/18 0306 02/04/18 0733 02/05/18 0227 02/06/18 0246 02/07/18 0610  WBC 5.7 6.4 6.7 5.8 7.1  HGB 8.9* 8.8* 8.6* 8.5* 8.4*  HCT 28.4* 28.7* 27.2* 26.9* 26.9*  MCV 71.0* 70.7* 69.9* 70.4* 69.0*  PLT 168 181 172 170 166   Basic Metabolic Panel: Recent Labs  Lab 02/03/18 0306 02/04/18 0733 02/05/18 0227 02/06/18 0246 02/07/18 0046  NA 139 138 138 139 139  K 3.8 3.8 4.2 4.4 4.3  CL 113* 112* 112* 111 112*  CO2 19* 18* 18* 17* 16*  GLUCOSE 87 82 93 83 85  BUN 23* 26* 34* 35* 40*  CREATININE 7.18* 9.18* 10.29* 11.71* 12.26*  CALCIUM 7.9* 8.1* 8.1* 8.2* 7.6*  PHOS  --   --  5.1* 5.8* 6.2*   GFR: Estimated Creatinine Clearance: 6.7 mL/min (A) (by C-G formula based on SCr of 12.26 mg/dL (H)). Liver Function Tests: Recent Labs  Lab 02/05/18 0227 02/06/18 0246 02/07/18 0046 02/07/18 0048  ALT  --   --   --  47  ALBUMIN 2.8* 3.1* 2.9*  --    No results for input(s): LIPASE, AMYLASE in the last 168 hours. No results for input(s): AMMONIA in the last 168 hours. Coagulation Profile: No results for input(s): INR, PROTIME in the last 168 hours. Cardiac Enzymes: No results for input(s): CKTOTAL, CKMB, CKMBINDEX, TROPONINI in the last 168 hours. BNP (last 3 results) No results for input(s): PROBNP in the last 8760 hours. HbA1C: No results for input(s): HGBA1C in the last 72 hours. CBG: Recent Labs  Lab 02/03/18 0640 02/04/18 0612 02/05/18 0621 02/06/18 0609 02/07/18 0622  GLUCAP 97 86 66 77 66   Lipid Profile: No results for input(s): CHOL, HDL, LDLCALC, TRIG, CHOLHDL, LDLDIRECT in the last 72 hours. Thyroid Function Tests: No results for  input(s): TSH, T4TOTAL, FREET4, T3FREE, THYROIDAB in the last 72 hours. Anemia Panel: No results for input(s): VITAMINB12, FOLATE, FERRITIN, TIBC, IRON, RETICCTPCT in the last 72 hours. Sepsis Labs: No results for input(s): PROCALCITON, LATICACIDVEN in the last 168 hours.  Recent Results (from the past 240 hour(s))  Blood culture (routine x 2)     Status: None   Collection Time: 01/30/18  3:25 PM  Result Value Ref Range Status   Specimen Description   Final    BLOOD RIGHT ANTECUBITAL Performed at [facility], [address] [place], [place] [address]    Special Requests   Final    BOTTLES DRAWN AEROBIC AND ANAEROBIC Blood Culture adequate volume Performed at [facility], [address] [place], [place] [address]    Culture   Final    NO GROWTH 5 DAYS Performed at [facility], [address], [place], [place] [address]  Report Status 02/04/2018 FINAL  Final  Blood culture (routine x 2)     Status: None   Collection Time: 01/30/18  3:30 PM  Result Value Ref Range Status   Specimen Description   Final    BLOOD BLOOD RIGHT HAND Performed at [facility], [address] [place], [place] [address]    Special Requests   Final    IN PEDIATRIC BOTTLE Blood Culture adequate volume Performed at [facility], [address] [place], [place] [address]    Culture   Final    NO GROWTH 5 DAYS Performed at [facility], [address], [place], [place] [address]    Report Status 02/04/2018 FINAL  Final      Radiology Studies: Ir Fluoro Guide Cv Line Right  Result Date: 02/07/2018 INDICATION: Renal failure EXAM: NON TUNNELED DIALYSIS CATHETER RIGHT JUGULAR MEDICATIONS: None ANESTHESIA/SEDATION: None FLUOROSCOPY TIME:  Fluoroscopy Time:  minutes 6 seconds (1 mGy). COMPLICATIONS: None immediate. PROCEDURE: Informed written consent was obtained from the patient after a thorough discussion of the procedural risks, benefits and  alternatives. All questions were addressed. Maximal Sterile Barrier Technique was utilized including caps, mask, sterile gowns, sterile gloves, sterile drape, hand hygiene and skin antiseptic. A timeout was performed prior to the initiation of the procedure. The right neck was prepped and draped in a sterile fashion. 1% lidocaine was utilized for local anesthesia. Under sonographic guidance, a micropuncture needle was inserted into the right internal jugular vein and removed over a 018 wire which was up sized to a 3 J. the tract was dilated to 12 [place] and 12 [place] temporary dialysis catheter was advanced over the wire. It was flushed and sewn in place. FINDINGS: Fluoroscopic and ultrasound imaging confirms vascular access and placement of a dialysis catheter with its tip at the cavoatrial junction. IMPRESSION: Successful placement of a non tunneled right jugular dialysis catheter with its tip at the cavoatrial junction. Electronically Signed   By: Jolaine Click M.D.   On: 02/07/2018 08:44   Ir US Guide Vasc Access Right  Result Date: 02/07/2018 INDICATION: Renal failure EXAM: NON TUNNELED DIALYSIS CATHETER RIGHT JUGULAR MEDICATIONS: None ANESTHESIA/SEDATION: None FLUOROSCOPY TIME:  Fluoroscopy Time:  minutes 6 seconds (1 mGy). COMPLICATIONS: None immediate. PROCEDURE: Informed written consent was obtained from the patient after a thorough discussion of the procedural risks, benefits and alternatives. All questions were addressed. Maximal Sterile Barrier Technique was utilized including caps, mask, sterile gowns, sterile gloves, sterile drape, hand hygiene and skin antiseptic. A timeout was performed prior to the initiation of the procedure. The right neck was prepped and draped in a sterile fashion. 1% lidocaine was utilized for local anesthesia. Under sonographic guidance, a micropuncture needle was inserted into the right internal jugular vein and removed over a 018 wire which was up sized to a 3 J. the  tract was dilated to 12 [place] and 12 [place] temporary dialysis catheter was advanced over the wire. It was flushed and sewn in place. FINDINGS: Fluoroscopic and ultrasound imaging confirms vascular access and placement of a dialysis catheter with its tip at the cavoatrial junction. IMPRESSION: Successful placement of a non tunneled right jugular dialysis catheter with its tip at the cavoatrial junction. Electronically Signed   By: Jolaine Click M.D.   On: 02/07/2018 08:44    Scheduled Meds: . amLODipine  5 mg Oral Daily  . ARIPiprazole  2 mg Oral Daily  . ezetimibe  10 mg Oral  Daily  . famotidine  20 mg Oral Daily  . gabapentin  300 mg Oral QHS  . heparin  40 Units/kg Dialysis Once in dialysis  . levothyroxine  25 mcg Oral QAC breakfast  . polyethylene glycol  17 g Oral Daily  . senna-docusate  1 tablet Oral BID  . sodium chloride flush  3 mL Intravenous Q12H  . topiramate  100 mg Oral BID  . venlafaxine XR  75 mg Oral Q breakfast   Continuous Infusions: . sodium chloride    . sodium chloride    . sodium chloride    . heparin 950 Units/hr (02/07/18 1141)     LOS: 7 days    Time spent: 20 min  Penny Pia, MD Pager: Text Page via [email or web address]   If 7PM-7AM, please contact night-coverage [email or web address] 02/07/2018, 4:25 PM

## 2018-02-07 NOTE — Progress Notes (Signed)
Subjective: Interval History: has no complaint, feels better.  Objective: Vital signs in last 24 hours: Temp:  [97.8 F (36.6 C)-97.9 F (36.6 C)] 97.9 F (36.6 C) (02/28 0522) Pulse Rate:  [70-77] 77 (02/28 0522) Resp:  [10-18] 18 (02/28 0522) BP: (141-187)/(78-100) 148/80 (02/28 0522) SpO2:  [100 %] 100 % (02/28 0522) Weight:  [112.1 kg (247 lb 2.2 oz)-113.1 kg (249 lb 5.4 oz)] 112.1 kg (247 lb 2.2 oz) (02/28 0257) Weight change:   Intake/Output from previous day: 02/27 0701 - 02/28 0700 In: -  Out: 1804 [Urine:800] Intake/Output this shift: No intake/output data recorded.  General appearance: alert, cooperative, no distress and moderately obese Neck: RIJ cath Resp: diminished breath sounds bilaterally Cardio: S1, S2 normal and systolic murmur: holosystolic 2/6, blowing at apex GI: obese, pos bs, liver down 3 cm Extremities: edema 1+, Larm more swollen than R  Lab Results: Recent Labs    02/06/18 0246 02/07/18 0610  WBC 5.8 7.1  HGB 8.5* 8.4*  HCT 26.9* 26.9*  PLT 170 166   BMET:  Recent Labs    02/06/18 0246 02/07/18 0046  NA 139 139  K 4.4 4.3  CL 111 112*  CO2 17* 16*  GLUCOSE 83 85  BUN 35* 40*  CREATININE 11.71* 12.26*  CALCIUM 8.2* 7.6*   No results for input(s): PTH in the last 72 hours. Iron Studies: No results for input(s): IRON, TIBC, TRANSFERRIN, FERRITIN in the last 72 hours.  Studies/Results: Ir Fluoro Guide Cv Line Right  Result Date: 02/07/2018 INDICATION: Renal failure EXAM: NON TUNNELED DIALYSIS CATHETER RIGHT JUGULAR MEDICATIONS: None ANESTHESIA/SEDATION: None FLUOROSCOPY TIME:  Fluoroscopy Time:  minutes 6 seconds (1 mGy). COMPLICATIONS: None immediate. PROCEDURE: Informed written consent was obtained from the patient after a thorough discussion of the procedural risks, benefits and alternatives. All questions were addressed. Maximal Sterile Barrier Technique was utilized including caps, mask, sterile gowns, sterile gloves, sterile  drape, hand hygiene and skin antiseptic. A timeout was performed prior to the initiation of the procedure. The right neck was prepped and draped in a sterile fashion. 1% lidocaine was utilized for local anesthesia. Under sonographic guidance, a micropuncture needle was inserted into the right internal jugular vein and removed over a 018 wire which was up sized to a 3 J. the tract was dilated to 12 [place] and 12 [place] temporary dialysis catheter was advanced over the wire. It was flushed and sewn in place. FINDINGS: Fluoroscopic and ultrasound imaging confirms vascular access and placement of a dialysis catheter with its tip at the cavoatrial junction. IMPRESSION: Successful placement of a non tunneled right jugular dialysis catheter with its tip at the cavoatrial junction. Electronically Signed   By: Jolaine ClickArthur  Hoss M.D.   On: 02/07/2018 08:44    I have reviewed the patient's current medications.  Assessment/Plan: 1 AKI nonoliguric, follow vol.  Had HD early this am,  Check chem in am  2 Anemia 3 DVT 4 Obesity P HD as needed., follow chem, urine, cont anticoag    LOS: 7 days   Fayrene FearingJames Onyx Schirmer 02/07/2018,8:47 AM

## 2018-02-07 NOTE — Progress Notes (Signed)
ANTICOAGULATION CONSULT NOTE - Follow Up Consult  Pharmacy Consult for Heparin Indication: DVT  Allergies  Allergen Reactions  . Aspirin Other (See Comments)    Per Dr  . Augmentin [Amoxicillin-Pot Clavulanate] Other (See Comments)    Blisters in the back of throat  . Dexilant [Dexlansoprazole] Other (See Comments)    Bowel urgency and liquid stools  . Hydrocodone-Acetaminophen Hives    REACTION: Rash  . Influenza Vaccines Swelling    Arm swelled to the size of a grapefruit  . Levsin [Hyoscyamine Sulfate] Other (See Comments)    Projectile vomitting  . Linzess [Linaclotide] Other (See Comments)    Bowel urgency and liquid stools  . Promethazine Nausea And Vomiting  . Propoxyphene N-Acetaminophen Itching    REACTION: rash  . Statins Other (See Comments)    myalgia  . Acetaminophen Itching and Rash  . Latex Itching and Rash    Rash-looks like she has the measles per pt  . Oxycodone-Acetaminophen Itching and Rash    REACTION: Rash    Patient Measurements: Height: 5\' 9"  (175.3 cm) Weight: 247 lb 2.2 oz (112.1 kg) IBW/kg (Calculated) : 66.2 Heparin Dosing Weight:  89.5 kg  Vital Signs: Temp: 98.2 F (36.8 C) (02/28 2032) Temp Source: Oral (02/28 2032) BP: 159/88 (02/28 1152) Pulse Rate: 78 (02/28 2032)  Labs: Recent Labs    02/05/18 0227  02/06/18 0246 02/07/18 0046 02/07/18 0610 02/07/18 0928 02/07/18 2036  HGB 8.6*  --  8.5*  --  8.4*  --   --   HCT 27.2*  --  26.9*  --  26.9*  --   --   PLT 172  --  170  --  166  --   --   HEPARINUNFRC 0.27*   < > 0.46  --  1.02* 1.04* 0.37  CREATININE 10.29*  --  11.71* 12.26*  --   --   --    < > = values in this interval not displayed.    Estimated Creatinine Clearance: 6.7 mL/min (A) (by C-G formula based on SCr of 12.26 mg/dL (H)).  Assessment:  Anticoag: heparin for extensive DVT of unknown etiology, s/p lysis 2/21. Hgb 8.4, Plts 166 stable. C/o another bleed in in L thigh and new mass on the back of L leg (some  bruising and a small hematoma on the back of her left leg) another small palpable mass on the left posterior thigh as well. Some hematuria noted 2/25 and ongoing. HL 0.46>>1.02 because of heparin received in HD overnight. However repeat level 1.04 still high. Large jump overnight.  -2/21 12:30: Angiojet thrombectomy w/ Alteplase - 2/27: Heparin off for HD catheter. Not sure of time. RN note it was resumed at 2350  2/28 pm: HL therapeutic at 0.37  Goal of Therapy:  Heparin level 0.3-0.7 units/ml Monitor platelets by anticoagulation protocol: Yes   Plan:  - Continue IV heparin at 950 units/hr. Recheck heparin level in 8 hrs. - Heparin level daily wth CBC daily - Planning oral AC if ok with VVS - Awaiting results of L chest US for chest wall mass   Thank you for allowing us to participate in this patients care.  Signe Coltonya C Melvie Paglia, PharmD If after 10:30p, please call main pharmacy at: [phone] 02/07/2018 10:28 PM

## 2018-02-07 NOTE — Progress Notes (Signed)
ANTICOAGULATION CONSULT NOTE - Follow Up Consult  Pharmacy Consult for Heparin Indication: DVT  Allergies  Allergen Reactions  . Aspirin Other (See Comments)    Per Dr  . Augmentin [Amoxicillin-Pot Clavulanate] Other (See Comments)    Blisters in the back of throat  . Dexilant [Dexlansoprazole] Other (See Comments)    Bowel urgency and liquid stools  . Hydrocodone-Acetaminophen Hives    REACTION: Rash  . Influenza Vaccines Swelling    Arm swelled to the size of a grapefruit  . Levsin [Hyoscyamine Sulfate] Other (See Comments)    Projectile vomitting  . Linzess [Linaclotide] Other (See Comments)    Bowel urgency and liquid stools  . Promethazine Nausea And Vomiting  . Propoxyphene N-Acetaminophen Itching    REACTION: rash  . Statins Other (See Comments)    myalgia  . Acetaminophen Itching and Rash  . Latex Itching and Rash    Rash-looks like she has the measles per pt  . Oxycodone-Acetaminophen Itching and Rash    REACTION: Rash    Patient Measurements: Height: 5\' 9"  (175.3 cm) Weight: 247 lb 2.2 oz (112.1 kg) IBW/kg (Calculated) : 66.2 Heparin Dosing Weight:  89.5 kg  Vital Signs: Temp: 97.9 F (36.6 C) (02/28 0522) Temp Source: Oral (02/28 0522) BP: 149/92 (02/28 0912) Pulse Rate: 77 (02/28 0522)  Labs: Recent Labs    02/05/18 0227  02/06/18 0246 02/07/18 0046 02/07/18 0610 02/07/18 0928  HGB 8.6*  --  8.5*  --  8.4*  --   HCT 27.2*  --  26.9*  --  26.9*  --   PLT 172  --  170  --  166  --   HEPARINUNFRC 0.27*   < > 0.46  --  1.02* 1.04*  CREATININE 10.29*  --  11.71* 12.26*  --   --    < > = values in this interval not displayed.    Estimated Creatinine Clearance: 6.7 mL/min (A) (by C-G formula based on SCr of 12.26 mg/dL (H)).  Assessment:  Anticoag: heparin for extensive DVT of unknown etiology, s/p lysis 2/21. Hgb 8.4, Plts 166 stable. C/o another bleed in in L thigh and new mass on the back of L leg (some bruising and a small hematoma on the  back of her left leg) another small palpable mass on the left posterior thigh as well. Some hematuria noted 2/25 and ongoing. HL 0.46>>1.02 because of heparin received in HD overnight. However repeat level 1.04 still high. Large jump overnight.  -2/21 12:30: Angiojet thrombectomy w/ Alteplase - 2/27: Heparin off for HD catheter. Not sure of time. RN note it was resumed at 2350  Goal of Therapy:  Heparin level 0.3-0.7 units/ml Monitor platelets by anticoagulation protocol: Yes   Plan:  - Decrease IV heparin to (from 1250 units/hr) 950 units/hr. Recheck heparin level in 8 hrs. - Heparin level daily wth CBC daily - Planning oral AC if ok with VVS - Awaiting results of L chest US for chest wall mass  Mckayla Mulcahey S. Merilynn Finlandobertson, PharmD, BCPS Clinical Staff Pharmacist Pager [phone]  Misty Stanleyobertson, Carlyle Mcelrath Stillinger 02/07/2018,11:31 AM

## 2018-02-07 NOTE — Progress Notes (Addendum)
HD tx completed @ 5974 w/o problem, UF goal met, blood rinsed back, VSS, report called to Sherri Rad, RN. Also made primary nurse aware that pt needed to go to the bathroom. I offered pt the bed pan and she refused, pt's mobility was very unsteady when she arrived to the unit as she was brought by transport in w/c instead of bed, when primary nurse brought bed to me and pt was transferred to bed she stood up and couldn't stand and fell back to her w/c. Me being alone in the unit, I didn't feel it was safe to attempt to mobilize pt in the unit to try and go to the bathroom. Explained this to the pt and she again refused the bed pan.  Reported to primary nurse that he would need to get assistance to help him take her to the bathroom or BSC as soon as she got back to her room.  Pt wanted some towels to put b/w her legs incase,

## 2018-02-08 LAB — RENAL FUNCTION PANEL
ALBUMIN: 3 g/dL — AB (ref 3.5–5.0)
Anion gap: 11 (ref 5–15)
BUN: 29 mg/dL — AB (ref 6–20)
CO2: 21 mmol/L — ABNORMAL LOW (ref 22–32)
CREATININE: 9.67 mg/dL — AB (ref 0.44–1.00)
Calcium: 8 mg/dL — ABNORMAL LOW (ref 8.9–10.3)
Chloride: 108 mmol/L (ref 101–111)
GFR calc Af Amer: 5 mL/min — ABNORMAL LOW (ref >60)
GFR, EST NON AFRICAN AMERICAN: 4 mL/min — AB (ref >60)
Glucose, Bld: 76 mg/dL (ref 65–99)
POTASSIUM: 4 mmol/L (ref 3.5–5.1)
Phosphorus: 4.7 mg/dL — ABNORMAL HIGH (ref 2.5–4.6)
Sodium: 140 mmol/L (ref 135–145)

## 2018-02-08 LAB — URINALYSIS, ROUTINE W REFLEX MICROSCOPIC

## 2018-02-08 LAB — URINALYSIS, MICROSCOPIC (REFLEX)

## 2018-02-08 LAB — HEPATITIS B SURFACE ANTIBODY,QUALITATIVE: Hep B S Ab: NONREACTIVE

## 2018-02-08 LAB — CBC
HCT: 25.9 % — ABNORMAL LOW (ref 36.0–46.0)
HEMOGLOBIN: 8.1 g/dL — AB (ref 12.0–15.0)
MCH: 21.6 pg — ABNORMAL LOW (ref 26.0–34.0)
MCHC: 31.3 g/dL (ref 30.0–36.0)
MCV: 69.1 fL — AB (ref 78.0–100.0)
PLATELETS: 169 10*3/uL (ref 150–400)
RBC: 3.75 MIL/uL — ABNORMAL LOW (ref 3.87–5.11)
RDW: 16.8 % — ABNORMAL HIGH (ref 11.5–15.5)
WBC: 6.8 10*3/uL (ref 4.0–10.5)

## 2018-02-08 LAB — HEPARIN LEVEL (UNFRACTIONATED)
Heparin Unfractionated: 0.28 IU/mL — ABNORMAL LOW (ref 0.30–0.70)
Heparin Unfractionated: 0.42 IU/mL (ref 0.30–0.70)
Heparin Unfractionated: 0.45 IU/mL (ref 0.30–0.70)

## 2018-02-08 LAB — GLUCOSE, CAPILLARY: GLUCOSE-CAPILLARY: 89 mg/dL (ref 65–99)

## 2018-02-08 LAB — HEPATITIS B SURFACE ANTIGEN: HEP B S AG: NEGATIVE

## 2018-02-08 LAB — HEPATITIS B CORE ANTIBODY, TOTAL: Hep B Core Total Ab: NEGATIVE

## 2018-02-08 NOTE — Progress Notes (Signed)
Subjective: Interval History: has complaints passing blood in urine.  Objective: Vital signs in last 24 hours: Temp:  [98.1 F (36.7 C)-98.2 F (36.8 C)] 98.1 F (36.7 C) (03/01 0800) Pulse Rate:  [76-78] 78 (03/01 0800) Resp:  [18-20] 18 (03/01 0800) BP: (149-159)/(72-88) 159/81 (03/01 0800) SpO2:  [100 %] 100 % (03/01 0800) Weight:  [113 kg (249 lb 1.6 oz)] 113 kg (249 lb 1.6 oz) (03/01 0300) Weight change: -0.109 kg (-3.8 oz)  Intake/Output from previous day: 02/28 0701 - 03/01 0700 In: 360 [P.O.:360] Out: 1850 [Urine:1850] Intake/Output this shift: No intake/output data recorded.  General appearance: alert, cooperative, no distress, moderately obese and pale Neck: R IJ cath Resp: clear to auscultation bilaterally Cardio: S1, S2 normal GI: obese, pos bs, soft, Extremities: edema Tr  Lab Results: Recent Labs    02/07/18 0610 02/08/18 0333  WBC 7.1 6.8  HGB 8.4* 8.1*  HCT 26.9* 25.9*  PLT 166 169   BMET:  Recent Labs    02/07/18 0046 02/08/18 0333  NA 139 140  K 4.3 4.0  CL 112* 108  CO2 16* 21*  GLUCOSE 85 76  BUN 40* 29*  CREATININE 12.26* 9.67*  CALCIUM 7.6* 8.0*   No results for input(s): PTH in the last 72 hours. Iron Studies: No results for input(s): IRON, TIBC, TRANSFERRIN, FERRITIN in the last 72 hours.  Studies/Results: Ir Fluoro Guide Cv Line Right  Result Date: 02/07/2018 INDICATION: Renal failure EXAM: NON TUNNELED DIALYSIS CATHETER RIGHT JUGULAR MEDICATIONS: None ANESTHESIA/SEDATION: None FLUOROSCOPY TIME:  Fluoroscopy Time:  minutes 6 seconds (1 mGy). COMPLICATIONS: None immediate. PROCEDURE: Informed written consent was obtained from the patient after a thorough discussion of the procedural risks, benefits and alternatives. All questions were addressed. Maximal Sterile Barrier Technique was utilized including caps, mask, sterile gowns, sterile gloves, sterile drape, hand hygiene and skin antiseptic. A timeout was performed prior to the  initiation of the procedure. The right neck was prepped and draped in a sterile fashion. 1% lidocaine was utilized for local anesthesia. Under sonographic guidance, a micropuncture needle was inserted into the right internal jugular vein and removed over a 018 wire which was up sized to a 3 J. the tract was dilated to 12 [place] and 12 [place] temporary dialysis catheter was advanced over the wire. It was flushed and sewn in place. FINDINGS: Fluoroscopic and ultrasound imaging confirms vascular access and placement of a dialysis catheter with its tip at the cavoatrial junction. IMPRESSION: Successful placement of a non tunneled right jugular dialysis catheter with its tip at the cavoatrial junction. Electronically Signed   By: Jolaine Click M.D.   On: 02/07/2018 08:44   Ir US Guide Vasc Access Right  Result Date: 02/07/2018 INDICATION: Renal failure EXAM: NON TUNNELED DIALYSIS CATHETER RIGHT JUGULAR MEDICATIONS: None ANESTHESIA/SEDATION: None FLUOROSCOPY TIME:  Fluoroscopy Time:  minutes 6 seconds (1 mGy). COMPLICATIONS: None immediate. PROCEDURE: Informed written consent was obtained from the patient after a thorough discussion of the procedural risks, benefits and alternatives. All questions were addressed. Maximal Sterile Barrier Technique was utilized including caps, mask, sterile gowns, sterile gloves, sterile drape, hand hygiene and skin antiseptic. A timeout was performed prior to the initiation of the procedure. The right neck was prepped and draped in a sterile fashion. 1% lidocaine was utilized for local anesthesia. Under sonographic guidance, a micropuncture needle was inserted into the right internal jugular vein and removed over a 018 wire which was up sized to a 3 J. the tract was dilated  to 4912 [place] and 12 [place] temporary dialysis catheter was advanced over the wire. It was flushed and sewn in place. FINDINGS: Fluoroscopic and ultrasound imaging confirms vascular access and placement of a dialysis  catheter with its tip at the cavoatrial junction. IMPRESSION: Successful placement of a non tunneled right jugular dialysis catheter with its tip at the cavoatrial junction. Electronically Signed   By: Jolaine ClickArthur  Hoss M.D.   On: 02/07/2018 08:44    I have reviewed the patient's current medications.  Assessment/Plan: 1 AKI Cr trending down off HD. Good urine vol. Mild acidemia.. Vol status on 2 Anemia  3 DVT per VVS and primary 4 obesity 5 hematuria P UA, follow Cr, hold off HD    LOS: 8 days   Heather FearingJames Zeus Hanson 02/08/2018,9:34 AM

## 2018-02-08 NOTE — Progress Notes (Addendum)
PROGRESS NOTE [facility]   Heather Hanson   ZOX:[id number] DOB: 13-Nov-1961  DOA: 01/30/2018 PCP: Macy Mis, MD   Brief Narrative:  Heather Hanson is a 57 year old female with medical history significant for hyperlipidemia, GERD, hypothyroidism, depression and anxiety who presented to the emergency department complaining of left upper arm pain and swelling.  Upon ED evaluation patient was found to have left upper arm swelling venous Doppler showed deep venous thrombosis involving the brachial veins in the subclavians with a thrombus extending.  Also positive for superficial venous thrombosis involving the left basilic vein.  Vascular surgery was consulted and recommended admission for possible TPA.  Subjective: Pt reports no new complaints currently.  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  - discussed over the phone with hematologist who is recommending Coumadin due to her history of Renal Failure and dialysis.  - Vascular on board.  Acute kidney injury/ATN from contrast-induced nephropathy - secondary to AngioJet thrombolysis, venogram on 2/21 and CT angiogram of the chest on admission, kidney function was normal prior to that - Non Oliguric ATN - Nephrology consulted and assisting with case.  Left upper chest/Breast small hematoma -likely hematoma from recent procedure and anticoagulation  Anemia -stable, continue to monitor  Acute on chronic dysphagia /esophageal stricture -chronic problem, worse with cold liquids  -Barium esophagram concerning for possible stricture, able to tolerate current diet -Would benefit from GI workup pending improvement in kidney function, has been seen and scoped by Dr. Loreta Ave in the past, will defer this to outpatient  Hx of seizures    Asymptomatic  Continue Topomax   HLD  Continue Zocor   Depression and Anxiety  Continue home medications, doses lowered for GFR -xananx PRN  DVT prophylaxis: Heparin ggt  Code Status: Full Code  Family Communication: None at bedside  Disposition Plan: Home pending improvement in kidney function  Consultants:   Vascular surgery   Nephrology   Procedures:   TPA   Antimicrobials: Anti-infectives (From admission, onward)   Start     Dose/Rate Route Frequency Ordered Stop   02/02/18 1200  fluconazole (DIFLUCAN) tablet 100 mg  Status:  Discontinued     100 mg Oral Daily 02/02/18 1155 02/07/18 0913   01/30/18 1515  clindamycin (CLEOCIN) IVPB 900 mg     900 mg 100 mL/hr over 30 Minutes Intravenous  Once 01/30/18 1510 01/30/18 1608          Objective: Vitals:   02/07/18 2032 02/08/18 0300 02/08/18 0800 02/08/18 1159  BP:  (!) 149/72 (!) 159/81 (!) 156/85  Pulse: 78 78 78   Resp: 20 20 18 19   Temp: 98.2 F (36.8 C) 98.2 F (36.8 C) 98.1 F (36.7 C)   TempSrc: Oral Oral Oral Oral  SpO2: 100% 100% 100% 99%  Weight:  113 kg (249 lb 1.6 oz)    Height:        Intake/Output Summary (Last 24 hours) at 02/08/2018 1804 Last data filed at 02/08/2018 1526 Gross per 24 hour  Intake 596 ml  Output 2750 ml  Net -2154 ml   Filed Weights   02/06/18 2341 02/07/18 0257 02/08/18 0300  Weight: 113.1 kg (249 lb 5.4 oz) 112.1 kg (247 lb 2.2 oz) 113 kg (249 lb 1.6 oz)    Examination: Exam unchanged when compared  to addendum: 02/07/2018 Gen: Awake, Alert, Oriented X 3, no distress HEENT: PERRLA, Neck supple, no JVD Lungs: CTAB, equal chest rise. Chest: small quarter sized oval mass in left upper breast/chest slightly tender-unchanged from 2/25 CVS: RRR,No Gallops,Rubs or new Murmurs Abd: soft, Non tender, non distended, BS present Extremities: mild left arm swelling Skin: no new rashes Psychiatry:  Mood & affect appropriate.    Data Reviewed: I have personally reviewed  following labs and imaging studies  CBC: Recent Labs  Lab 02/04/18 0733 02/05/18 0227 02/06/18 0246 02/07/18 0610 02/08/18 0333  WBC 6.4 6.7 5.8 7.1 6.8  HGB 8.8* 8.6* 8.5* 8.4* 8.1*  HCT 28.7* 27.2* 26.9* 26.9* 25.9*  MCV 70.7* 69.9* 70.4* 69.0* 69.1*  PLT 181 172 170 166 169   Basic Metabolic Panel: Recent Labs  Lab 02/04/18 0733 02/05/18 0227 02/06/18 0246 02/07/18 0046 02/08/18 0333  NA 138 138 139 139 140  K 3.8 4.2 4.4 4.3 4.0  CL 112* 112* 111 112* 108  CO2 18* 18* 17* 16* 21*  GLUCOSE 82 93 83 85 76  BUN 26* 34* 35* 40* 29*  CREATININE 9.18* 10.29* 11.71* 12.26* 9.67*  CALCIUM 8.1* 8.1* 8.2* 7.6* 8.0*  PHOS  --  5.1* 5.8* 6.2* 4.7*   GFR: Estimated Creatinine Clearance: 8.5 mL/min (A) (by C-G formula based on SCr of 9.67 mg/dL (H)). Liver Function Tests: Recent Labs  Lab 02/05/18 0227 02/06/18 0246 02/07/18 0046 02/07/18 0048 02/08/18 0333  ALT  --   --   --  47  --   ALBUMIN 2.8* 3.1* 2.9*  --  3.0*   No results for input(s): LIPASE, AMYLASE in the last 168 hours. No results for input(s): AMMONIA in the last 168 hours. Coagulation Profile: No results for input(s): INR, PROTIME in the last 168 hours. Cardiac Enzymes: No results for input(s): CKTOTAL, CKMB, CKMBINDEX, TROPONINI in the last 168 hours. BNP (last 3 results) No results for input(s): PROBNP in the last 8760 hours. HbA1C: No results for input(s): HGBA1C in the last 72 hours. CBG: Recent Labs  Lab 02/04/18 0612 02/05/18 0621 02/06/18 0609 02/07/18 0622 02/08/18 0555  GLUCAP 86 66 77 66 89   Lipid Profile: No results for input(s): CHOL, HDL, LDLCALC, TRIG, CHOLHDL, LDLDIRECT in the last 72 hours. Thyroid Function Tests: No results for input(s): TSH, T4TOTAL, FREET4, T3FREE, THYROIDAB in the last 72 hours. Anemia Panel: No results for input(s): VITAMINB12, FOLATE, FERRITIN, TIBC, IRON, RETICCTPCT in the last 72 hours. Sepsis Labs: No results for input(s): PROCALCITON,  LATICACIDVEN in the last 168 hours.  Recent Results (from the past 240 hour(s))  Blood culture (routine x 2)     Status: None   Collection Time: 01/30/18  3:25 PM  Result Value Ref Range Status   Specimen Description   Final    BLOOD RIGHT ANTECUBITAL Performed at [facility] [place], [address] [place], [place] [address]    Special Requests   Final    BOTTLES DRAWN AEROBIC AND ANAEROBIC Blood Culture adequate volume Performed at [facility] [place], [address] [place], [place] [address]    Culture   Final    NO GROWTH 5 DAYS Performed at [facility], [address] [place], [place] [address]    Report Status 02/04/2018 FINAL  Final  Blood culture (routine x 2)     Status: None   Collection Time: 01/30/18  3:30 PM  Result Value Ref Range Status   Specimen  Description   Final    BLOOD BLOOD RIGHT HAND Performed at [facility], [address] [place], [place] [address]    Special Requests   Final    IN PEDIATRIC BOTTLE Blood Culture adequate volume Performed at [facility], [address] [place], [place] [address]    Culture   Final    NO GROWTH 5 DAYS Performed at [facility], [address] [place], [place] [address]    Report Status 02/04/2018 FINAL  Final      Radiology Studies: No results found.  Scheduled Meds: . amLODipine  5 mg Oral Daily  . ARIPiprazole  2 mg Oral Daily  . ezetimibe  10 mg Oral Daily  . famotidine  20 mg Oral Daily  . gabapentin  300 mg Oral QHS  . heparin  40 Units/kg Dialysis Once in dialysis  . levothyroxine  25 mcg Oral QAC breakfast  . polyethylene glycol  17 g Oral Daily  . senna-docusate  1 tablet Oral BID  . sodium chloride flush  3 mL Intravenous Q12H  . topiramate  100 mg Oral BID  . venlafaxine XR  75 mg Oral Q breakfast   Continuous Infusions: . sodium chloride    . sodium chloride    . sodium chloride    . heparin 1,100 Units/hr (02/08/18 0625)     LOS: 8  days    Time spent: 15 min  Heather Pia, MD Pager: Text Page via [email or web address]   If 7PM-7AM, please contact night-coverage [email or web address] 02/08/2018, 6:04 PM

## 2018-02-08 NOTE — Progress Notes (Signed)
ANTICOAGULATION CONSULT NOTE - Follow Up Consult  Pharmacy Consult for Heparin Indication: DVT  Allergies  Allergen Reactions  . Aspirin Other (See Comments)    Per Dr  . Augmentin [Amoxicillin-Pot Clavulanate] Other (See Comments)    Blisters in the back of throat  . Dexilant [Dexlansoprazole] Other (See Comments)    Bowel urgency and liquid stools  . Hydrocodone-Acetaminophen Hives    REACTION: Rash  . Influenza Vaccines Swelling    Arm swelled to the size of a grapefruit  . Levsin [Hyoscyamine Sulfate] Other (See Comments)    Projectile vomitting  . Linzess [Linaclotide] Other (See Comments)    Bowel urgency and liquid stools  . Promethazine Nausea And Vomiting  . Propoxyphene N-Acetaminophen Itching    REACTION: rash  . Statins Other (See Comments)    myalgia  . Acetaminophen Itching and Rash  . Latex Itching and Rash    Rash-looks like she has the measles per pt  . Oxycodone-Acetaminophen Itching and Rash    REACTION: Rash    Patient Measurements: Height: 5\' 9"  (175.3 cm) Weight: 249 lb 1.6 oz (113 kg) IBW/kg (Calculated) : 66.2 Heparin Dosing Weight:  89.5 kg  Vital Signs: Temp: 98.2 F (36.8 C) (03/01 0300) Temp Source: Oral (03/01 0300) BP: 149/72 (03/01 0300) Pulse Rate: 78 (03/01 0300)  Labs: Recent Labs    02/06/18 0246 02/07/18 0046 02/07/18 0610 02/07/18 0928 02/07/18 2036 02/08/18 0333  HGB 8.5*  --  8.4*  --   --  8.1*  HCT 26.9*  --  26.9*  --   --  25.9*  PLT 170  --  166  --   --  169  HEPARINUNFRC 0.46  --  1.02* 1.04* 0.37 0.28*  CREATININE 11.71* 12.26*  --   --   --  9.67*    Estimated Creatinine Clearance: 8.5 mL/min (A) (by C-G formula based on SCr of 9.67 mg/dL (H)).  Assessment:  Anticoag: heparin for extensive DVT of unknown etiology, s/p lysis 2/21. Hgb 8.4, Plts 166 stable. C/o another bleed in in L thigh and new mass on the back of L leg (some bruising and a small hematoma on the back of her left leg) another small  palpable mass on the left posterior thigh as well. Some hematuria noted 2/25 and ongoing.   3/1 am: heparin level just below goal at 0.28. No bleeding noted, will increase rate.  Goal of Therapy:  Heparin level 0.3-0.7 units/ml Monitor platelets by anticoagulation protocol: Yes   Plan:  - Increase IV heparin to 1100 units/hr. Recheck heparin level in 8 hrs. - Heparin level daily wth CBC daily - Planning oral AC if ok with VVS - Awaiting results of L chest US for chest wall mass   Thank you for allowing us to participate in this patients care.  Sheppard CoilFrank Kelin Nixon PharmD., BCPS Clinical Pharmacist 02/08/2018 6:18 AM

## 2018-02-08 NOTE — Progress Notes (Signed)
ANTICOAGULATION CONSULT NOTE - Follow Up Consult  Pharmacy Consult for Heparin Indication: DVT  Allergies  Allergen Reactions  . Aspirin Other (See Comments)    Per Dr  . Augmentin [Amoxicillin-Pot Clavulanate] Other (See Comments)    Blisters in the back of throat  . Dexilant [Dexlansoprazole] Other (See Comments)    Bowel urgency and liquid stools  . Hydrocodone-Acetaminophen Hives    REACTION: Rash  . Influenza Vaccines Swelling    Arm swelled to the size of a grapefruit  . Levsin [Hyoscyamine Sulfate] Other (See Comments)    Projectile vomitting  . Linzess [Linaclotide] Other (See Comments)    Bowel urgency and liquid stools  . Promethazine Nausea And Vomiting  . Propoxyphene N-Acetaminophen Itching    REACTION: rash  . Statins Other (See Comments)    myalgia  . Acetaminophen Itching and Rash  . Latex Itching and Rash    Rash-looks like she has the measles per pt  . Oxycodone-Acetaminophen Itching and Rash    REACTION: Rash    Patient Measurements: Height: 5\' 9"  (175.3 cm) Weight: 249 lb 1.6 oz (113 kg) IBW/kg (Calculated) : 66.2 Heparin Dosing Weight:  89.5 kg  Vital Signs: Temp: 97.8 F (36.6 C) (03/01 1945) Temp Source: Oral (03/01 1945) BP: 148/80 (03/01 1945)  Labs: Recent Labs    02/06/18 0246 02/07/18 0046 02/07/18 0610  02/08/18 0333 02/08/18 1407 02/08/18 2222  HGB 8.5*  --  8.4*  --  8.1*  --   --   HCT 26.9*  --  26.9*  --  25.9*  --   --   PLT 170  --  166  --  169  --   --   HEPARINUNFRC 0.46  --  1.02*   < > 0.28* 0.42 0.45  CREATININE 11.71* 12.26*  --   --  9.67*  --   --    < > = values in this interval not displayed.    Estimated Creatinine Clearance: 8.5 mL/min (A) (by C-G formula based on SCr of 9.67 mg/dL (H)).  Assessment: 56 yof continuing on heparin for extensive DVT of unknown etiology, s/p lysis 2/21. C/o another bleed in in L thigh and new mass on the back of L leg (some bruising and a small hematoma on the back of her  left leg) another small palpable mass on the left posterior thigh as well. Some hematuria noted 2/25 and ongoing.   Heparin level therapeutic this evening. No issues with bleeding noted this evening bleeding documented. CBC low but stable.  Goal of Therapy:  Heparin level 0.3-0.7 units/ml Monitor platelets by anticoagulation protocol: Yes   Plan:  - Continue IV heparin at 1100 units/hr - Heparin level daily with CBC daily - Monitor for s/sx bleeding - Planning oral AC if ok with VVS  Sheppard CoilFrank Wilson PharmD., BCPS Clinical Pharmacist 02/08/2018 11:11 PM

## 2018-02-08 NOTE — Progress Notes (Signed)
Progress Note    02/08/2018 7:20 AM 8 Days Post-Op  Subjective:  No complaints; says her doctors won't tell her the results of her test.   Afebrile HR 70's 140's-150's systolic 100% RA  Vitals:   02/07/18 2032 02/08/18 0300  BP:  (!) 149/72  Pulse: 78 78  Resp: 20 20  Temp: 98.2 F (36.8 C) 98.2 F (36.8 C)  SpO2: 100% 100%    Physical Exam: General:  No distress; sleeping comfortably Lungs:  Non labored Extremities:  Easily palpable left radial pulse; swelling in left arm about the same as a couple of days ago   CBC    Component Value Date/Time   WBC 6.8 02/08/2018 0333   RBC 3.75 (L) 02/08/2018 0333   HGB 8.1 (L) 02/08/2018 0333   HGB 11.7 07/06/2014 1504   HGB 11.7 07/01/2009 0910   HCT 25.9 (L) 02/08/2018 0333   HCT 36.8 07/06/2014 1504   HCT 36.4 07/01/2009 0910   PLT 169 02/08/2018 0333   PLT 204 07/06/2014 1504   PLT 190 07/01/2009 0910   MCV 69.1 (L) 02/08/2018 0333   MCV 72 (L) 07/06/2014 1504   MCV 70.7 (L) 07/01/2009 0910   MCH 21.6 (L) 02/08/2018 0333   MCHC 31.3 02/08/2018 0333   RDW 16.8 (H) 02/08/2018 0333   RDW 14.8 07/06/2014 1504   RDW 15.0 (H) 07/01/2009 0910   LYMPHSABS 1.7 01/30/2018 1248   LYMPHSABS 1.4 07/06/2014 1504   LYMPHSABS 1.0 07/01/2009 0910   MONOABS 0.5 01/30/2018 1248   MONOABS 0.4 07/01/2009 0910   EOSABS 0.3 01/30/2018 1248   EOSABS 0.2 07/06/2014 1504   BASOSABS 0.0 01/30/2018 1248   BASOSABS 0.0 07/06/2014 1504   BASOSABS 0.0 07/01/2009 0910    BMET    Component Value Date/Time   NA 140 02/08/2018 0333   NA 143 06/09/2013 1118   K 4.0 02/08/2018 0333   K 3.7 06/09/2013 1118   CL 108 02/08/2018 0333   CL 108 06/09/2013 1118   CO2 21 (L) 02/08/2018 0333   CO2 27 06/09/2013 1118   GLUCOSE 76 02/08/2018 0333   GLUCOSE 97 06/09/2013 1118   BUN 29 (H) 02/08/2018 0333   BUN 12 06/09/2013 1118   CREATININE 9.67 (H) 02/08/2018 0333   CREATININE 1.09 03/09/2014 0745   CALCIUM 8.0 (L) 02/08/2018 0333   CALCIUM 9.6 06/09/2013 1118   GFRNONAA 4 (L) 02/08/2018 0333   GFRAA 5 (L) 02/08/2018 0333    INR    Component Value Date/Time   INR 0.91 01/30/2018 1248     Intake/Output Summary (Last 24 hours) at 02/08/2018 0720 Last data filed at 02/08/2018 0600 Gross per 24 hour  Intake 360 ml  Output 1850 ml  Net -1490 ml   IMPRESSION: Likely benign approximate 2 cm hematoma involving the upper left breast. Abscess is felt less likely in the absence of hyperemia.  RECOMMENDATION: 1. I note the patient has not had a mammogram since 2014. Therefore, a diagnostic bilateral mammogram should obtained at [facility] after discharge. 2. Follow-up left breast ultrasound will be performed at the time of the diagnostic mammogram to confirm resolution of the likely benign hematoma.  Assessment:  57 y.o. female is s/p:  angiojet thrombectomy of left upper extremity for extensive dvt now with AKI   8 Days Post-Op  Plan: -pt doing well but appears down -u/s of mass on breast is likely benign hematoma and abscess less likely in absence of hyperemia.  DVT tx:  Heparin gtt -creatinine improved to 9.67 from 12.26-continue to monitor. Has received one dialysis tx.  Hopefully renal function will continue to improve   Doreatha Massed, PA-C [facility] [phone] 02/08/2018 7:20 AM

## 2018-02-08 NOTE — Progress Notes (Signed)
ANTICOAGULATION CONSULT NOTE - Follow Up Consult  Pharmacy Consult for Heparin Indication: DVT  Allergies  Allergen Reactions  . Aspirin Other (See Comments)    Per Dr  . Augmentin [Amoxicillin-Pot Clavulanate] Other (See Comments)    Blisters in the back of throat  . Dexilant [Dexlansoprazole] Other (See Comments)    Bowel urgency and liquid stools  . Hydrocodone-Acetaminophen Hives    REACTION: Rash  . Influenza Vaccines Swelling    Arm swelled to the size of a grapefruit  . Levsin [Hyoscyamine Sulfate] Other (See Comments)    Projectile vomitting  . Linzess [Linaclotide] Other (See Comments)    Bowel urgency and liquid stools  . Promethazine Nausea And Vomiting  . Propoxyphene N-Acetaminophen Itching    REACTION: rash  . Statins Other (See Comments)    myalgia  . Acetaminophen Itching and Rash  . Latex Itching and Rash    Rash-looks like she has the measles per pt  . Oxycodone-Acetaminophen Itching and Rash    REACTION: Rash    Patient Measurements: Height: 5\' 9"  (175.3 cm) Weight: 249 lb 1.6 oz (113 kg) IBW/kg (Calculated) : 66.2 Heparin Dosing Weight:  89.5 kg  Vital Signs: Temp: 98.1 F (36.7 C) (03/01 0800) Temp Source: Oral (03/01 1159) BP: 156/85 (03/01 1159) Pulse Rate: 78 (03/01 0800)  Labs: Recent Labs    02/06/18 0246 02/07/18 0046 02/07/18 0610  02/07/18 2036 02/08/18 0333 02/08/18 1407  HGB 8.5*  --  8.4*  --   --  8.1*  --   HCT 26.9*  --  26.9*  --   --  25.9*  --   PLT 170  --  166  --   --  169  --   HEPARINUNFRC 0.46  --  1.02*   < > 0.37 0.28* 0.42  CREATININE 11.71* 12.26*  --   --   --  9.67*  --    < > = values in this interval not displayed.    Estimated Creatinine Clearance: 8.5 mL/min (A) (by C-G formula based on SCr of 9.67 mg/dL (H)).  Assessment: 56 yof continuing on heparin for extensive DVT of unknown etiology, s/p lysis 2/21. C/o another bleed in in L thigh and new mass on the back of L leg (some bruising and a small  hematoma on the back of her left leg) another small palpable mass on the left posterior thigh as well. Some hematuria noted 2/25 and ongoing.   Heparin level therapeutic. No increased bleeding documented. CBC low but stable.  Goal of Therapy:  Heparin level 0.3-0.7 units/ml Monitor platelets by anticoagulation protocol: Yes   Plan:  - Continue IV heparin at 1100 units/hr - 8h heparin level to confirm - Heparin level daily with CBC daily - Monitor for s/sx bleeding - Planning oral AC if ok with VVS   Babs BertinHaley Negar Sieler, PharmD, BCPS Clinical Pharmacist Clinical phone for 02/08/2018 until 3:30pm: [phone] If after 3:30pm, please call main pharmacy at: [phone] 02/08/2018 2:57 PM

## 2018-02-09 LAB — RENAL FUNCTION PANEL
ALBUMIN: 3.2 g/dL — AB (ref 3.5–5.0)
Anion gap: 13 (ref 5–15)
BUN: 33 mg/dL — ABNORMAL HIGH (ref 6–20)
CO2: 21 mmol/L — ABNORMAL LOW (ref 22–32)
CREATININE: 11.03 mg/dL — AB (ref 0.44–1.00)
Calcium: 8.3 mg/dL — ABNORMAL LOW (ref 8.9–10.3)
Chloride: 109 mmol/L (ref 101–111)
GFR calc Af Amer: 4 mL/min — ABNORMAL LOW (ref >60)
GFR, EST NON AFRICAN AMERICAN: 3 mL/min — AB (ref >60)
Glucose, Bld: 75 mg/dL (ref 65–99)
PHOSPHORUS: 5.6 mg/dL — AB (ref 2.5–4.6)
POTASSIUM: 4.1 mmol/L (ref 3.5–5.1)
Sodium: 143 mmol/L (ref 135–145)

## 2018-02-09 LAB — GLUCOSE, CAPILLARY: Glucose-Capillary: 77 mg/dL (ref 65–99)

## 2018-02-09 LAB — CBC
HEMATOCRIT: 27.5 % — AB (ref 36.0–46.0)
HEMOGLOBIN: 8.7 g/dL — AB (ref 12.0–15.0)
MCH: 22 pg — AB (ref 26.0–34.0)
MCHC: 31.6 g/dL (ref 30.0–36.0)
MCV: 69.6 fL — ABNORMAL LOW (ref 78.0–100.0)
Platelets: 169 10*3/uL (ref 150–400)
RBC: 3.95 MIL/uL (ref 3.87–5.11)
RDW: 17.4 % — ABNORMAL HIGH (ref 11.5–15.5)
WBC: 6.7 10*3/uL (ref 4.0–10.5)

## 2018-02-09 LAB — HEPARIN LEVEL (UNFRACTIONATED): Heparin Unfractionated: 0.56 IU/mL (ref 0.30–0.70)

## 2018-02-09 MED ORDER — SODIUM CHLORIDE 0.9 % IV SOLN: 100.0000 mL | INTRAVENOUS | Status: DC | PRN

## 2018-02-09 MED ORDER — OXYCODONE HCL 5 MG PO TABS
ORAL_TABLET | ORAL | Status: AC
  Filled 2018-02-09: qty 2

## 2018-02-09 MED ORDER — HEPARIN SODIUM (PORCINE) 1000 UNIT/ML DIALYSIS
1000.0000 [IU] | INTRAMUSCULAR | Status: DC | PRN
  Filled 2018-02-09: qty 1

## 2018-02-09 MED ORDER — LIDOCAINE-PRILOCAINE 2.5-2.5 % EX CREA
1.0000 "application " | TOPICAL_CREAM | CUTANEOUS | Status: DC | PRN
  Filled 2018-02-09: qty 5

## 2018-02-09 MED ORDER — LIDOCAINE HCL (PF) 1 % IJ SOLN: 5.0000 mL | INTRAMUSCULAR | Status: DC | PRN

## 2018-02-09 MED ORDER — ALTEPLASE 2 MG IJ SOLR: 2.0000 mg | Freq: Once | INTRAMUSCULAR | Status: DC | PRN

## 2018-02-09 MED ORDER — PENTAFLUOROPROP-TETRAFLUOROETH EX AERO: 1.0000 "application " | INHALATION_SPRAY | CUTANEOUS | Status: DC | PRN

## 2018-02-09 NOTE — Progress Notes (Signed)
ANTICOAGULATION CONSULT NOTE - Follow Up Consult  Pharmacy Consult for Heparin Indication: DVT  Allergies  Allergen Reactions  . Aspirin Other (See Comments)    Per Dr  . Augmentin [Amoxicillin-Pot Clavulanate] Other (See Comments)    Blisters in the back of throat  . Dexilant [Dexlansoprazole] Other (See Comments)    Bowel urgency and liquid stools  . Hydrocodone-Acetaminophen Hives    REACTION: Rash  . Influenza Vaccines Swelling    Arm swelled to the size of a grapefruit  . Levsin [Hyoscyamine Sulfate] Other (See Comments)    Projectile vomitting  . Linzess [Linaclotide] Other (See Comments)    Bowel urgency and liquid stools  . Promethazine Nausea And Vomiting  . Propoxyphene N-Acetaminophen Itching    REACTION: rash  . Statins Other (See Comments)    myalgia  . Acetaminophen Itching and Rash  . Latex Itching and Rash    Rash-looks like she has the measles per pt  . Oxycodone-Acetaminophen Itching and Rash    REACTION: Rash    Patient Measurements: Height: 5\' 9"  (175.3 cm) Weight: 242 lb (109.8 kg) IBW/kg (Calculated) : 66.2 Heparin Dosing Weight:  89.5 kg  Vital Signs: Temp: 97.9 F (36.6 C) (03/02 0355) Temp Source: Oral (03/02 0355) BP: 176/94 (03/02 0802) Pulse Rate: 79 (03/02 0802)  Labs: Recent Labs    02/07/18 0046  02/07/18 0610  02/08/18 0333 02/08/18 1407 02/08/18 2222 02/09/18 0442  HGB  --    < > 8.4*  --  8.1*  --   --  8.7*  HCT  --   --  26.9*  --  25.9*  --   --  27.5*  PLT  --   --  166  --  169  --   --  169  HEPARINUNFRC  --   --  1.02*   < > 0.28* 0.42 0.45 0.56  CREATININE 12.26*  --   --   --  9.67*  --   --  11.03*   < > = values in this interval not displayed.    Estimated Creatinine Clearance: 7.3 mL/min (A) (by C-G formula based on SCr of 11.03 mg/dL (H)).  Assessment: 56 yof continuing on heparin for extensive DVT of unknown etiology, s/p lysis 2/21. C/o another bleed in in L thigh and new mass on the back of L leg (some  bruising and a small hematoma on the back of her left leg) another small palpable mass on the left posterior thigh as well. Some hematuria noted 2/25 and ongoing per MD notes.   Heparin level therapeutic. No issues with bleeding noted this evening bleeding documented. CBC low but stable.  Goal of Therapy:  Heparin level 0.3-0.7 units/ml Monitor platelets by anticoagulation protocol: Yes   Plan:  - Continue IV heparin at 1100 units/hr - Heparin level daily with CBC daily - Monitor for increased s/sx bleeding - F/u plans for PO anticoagulation as appropriate  Heather BertinHaley Jenniefer Hanson, PharmD, BCPS Clinical Pharmacist Clinical phone for 02/09/2018 until 3:30pm: [phone] If after 3:30pm, please call main pharmacy at: [phone] 02/09/2018 10:38 AM

## 2018-02-09 NOTE — Progress Notes (Signed)
PROGRESS NOTE [facility]   Heather Hanson   ZOX:[id number] DOB: [birth year]  DOA: 01/30/2018 PCP: Macy Mis, MD   Brief Narrative:  Heather Hanson is a 57 year old female with medical history significant for hyperlipidemia, GERD, hypothyroidism, depression and anxiety who presented to the emergency department complaining of left upper arm pain and swelling.  Upon ED evaluation patient was found to have left upper arm swelling venous Doppler showed deep venous thrombosis involving the brachial veins in the subclavians with a thrombus extending.  Also positive for superficial venous thrombosis involving the left basilic vein.  Vascular surgery was consulted and recommended admission for possible TPA.  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  - discussed over the phone with hematologist who is recommending Coumadin due to Renal Failure and dialysis.  - Vascular on board.  Acute kidney injury/ATN from contrast-induced nephropathy - Secondary to AngioJet thrombolysis, venogram on 2/21 and CT angiogram of the chest on admission, kidney function was normal prior to that - Non Oliguric ATN - Nephrology on board and plans are for dialysis session.  Left upper chest/Breast small hematoma -likely hematoma from recent procedure and anticoagulation  Anemia -stable, continue to monitor  Acute on chronic dysphagia /esophageal stricture -chronic problem, worse with cold liquids  -Barium esophagram concerning for possible stricture, able to tolerate current diet -Would benefit from GI workup pending improvement in kidney function, has been seen and scoped by Dr. Loreta Ave in the past, will defer this to outpatient  Hx of seizures  Asymptomatic  Continue  Topomax   HLD  Continue Zocor   Depression and Anxiety  Continue home medications, doses lowered for GFR -xananx PRN  DVT prophylaxis: Heparin ggt  Code Status: Full Code  Family Communication: None at bedside  Disposition Plan: Home pending improvement in kidney function  Consultants:   Vascular surgery   Nephrology   Procedures:   TPA   Antimicrobials: Anti-infectives (From admission, onward)   Start     Dose/Rate Route Frequency Ordered Stop   02/02/18 1200  fluconazole (DIFLUCAN) tablet 100 mg  Status:  Discontinued     100 mg Oral Daily 02/02/18 1155 02/07/18 0913   01/30/18 1515  clindamycin (CLEOCIN) IVPB 900 mg     900 mg 100 mL/hr over 30 Minutes Intravenous  Once 01/30/18 1510 01/30/18 1608          Objective: Vitals:   02/08/18 1945 02/09/18 0355 02/09/18 0641 02/09/18 0802  BP: (!) 148/80 (!) 151/73  (!) 176/94  Pulse:  78  79  Resp: 20 20    Temp: 97.8 F (36.6 C) 97.9 F (36.6 C)    TempSrc: Oral Oral    SpO2: 100% 100%    Weight:   109.8 kg (242 lb)   Height:        Intake/Output Summary (Last 24 hours) at 02/09/2018 1423 Last data filed at 02/09/2018 0958 Gross per 24 hour  Intake 961 ml  Output 1550 ml  Net -589 ml   Filed Weights   02/07/18 0257 02/08/18 0300 02/09/18 0641  Weight: 112.1 kg (247 lb 2.2 oz) 113 kg (249 lb 1.6 oz) 109.8 kg (242 lb)    Examination: Exam unchanged when compared to 02/08/2018 Gen: Awake, Alert, Oriented X 3, no distress  HEENT: PERRLA, Neck supple, no JVD Lungs: CTAB, equal chest rise. Chest: small quarter sized oval mass in left upper breast/chest slightly tender-unchanged from 2/25 CVS: RRR,No Gallops,Rubs or new Murmurs Abd: soft, Non tender, non distended, BS present Extremities: mild left arm swelling Skin: no new rashes Psychiatry:  Mood & affect appropriate.    Data Reviewed: I have personally reviewed following labs and imaging studies  CBC: Recent Labs  Lab 02/05/18 0227 02/06/18 0246  02/07/18 0610 02/08/18 0333 02/09/18 0442  WBC 6.7 5.8 7.1 6.8 6.7  HGB 8.6* 8.5* 8.4* 8.1* 8.7*  HCT 27.2* 26.9* 26.9* 25.9* 27.5*  MCV 69.9* 70.4* 69.0* 69.1* 69.6*  PLT 172 170 166 169 169   Basic Metabolic Panel: Recent Labs  Lab 02/05/18 0227 02/06/18 0246 02/07/18 0046 02/08/18 0333 02/09/18 0442  NA 138 139 139 140 143  K 4.2 4.4 4.3 4.0 4.1  CL 112* 111 112* 108 109  CO2 18* 17* 16* 21* 21*  GLUCOSE 93 83 85 76 75  BUN 34* 35* 40* 29* 33*  CREATININE 10.29* 11.71* 12.26* 9.67* 11.03*  CALCIUM 8.1* 8.2* 7.6* 8.0* 8.3*  PHOS 5.1* 5.8* 6.2* 4.7* 5.6*   GFR: Estimated Creatinine Clearance: 7.3 mL/min (A) (by C-G formula based on SCr of 11.03 mg/dL (H)). Liver Function Tests: Recent Labs  Lab 02/05/18 0227 02/06/18 0246 02/07/18 0046 02/07/18 0048 02/08/18 0333 02/09/18 0442  ALT  --   --   --  47  --   --   ALBUMIN 2.8* 3.1* 2.9*  --  3.0* 3.2*   No results for input(s): LIPASE, AMYLASE in the last 168 hours. No results for input(s): AMMONIA in the last 168 hours. Coagulation Profile: No results for input(s): INR, PROTIME in the last 168 hours. Cardiac Enzymes: No results for input(s): CKTOTAL, CKMB, CKMBINDEX, TROPONINI in the last 168 hours. BNP (last 3 results) No results for input(s): PROBNP in the last 8760 hours. HbA1C: No results for input(s): HGBA1C in the last 72 hours. CBG: Recent Labs  Lab 02/05/18 0621 02/06/18 0609 02/07/18 0622 02/08/18 0555 02/09/18 0612  GLUCAP 66 77 66 89 77   Lipid Profile: No results for input(s): CHOL, HDL, LDLCALC, TRIG, CHOLHDL, LDLDIRECT in the last 72 hours. Thyroid Function Tests: No results for input(s): TSH, T4TOTAL, FREET4, T3FREE, THYROIDAB in the last 72 hours. Anemia Panel: No results for input(s): VITAMINB12, FOLATE, FERRITIN, TIBC, IRON, RETICCTPCT in the last 72 hours. Sepsis Labs: No results for input(s): PROCALCITON, LATICACIDVEN in the last 168 hours.  Recent Results (from the past 240  hour(s))  Blood culture (routine x 2)     Status: None   Collection Time: 01/30/18  3:25 PM  Result Value Ref Range Status   Specimen Description   Final    BLOOD RIGHT ANTECUBITAL Performed at [facility], [address] [place], [place] [address]    Special Requests   Final    BOTTLES DRAWN AEROBIC AND ANAEROBIC Blood Culture adequate volume Performed at [facility], [address] [place], [place] [address]    Culture   Final    NO GROWTH 5 DAYS Performed at [facility], [address] [place], [place] [address]    Report Status 02/04/2018 FINAL  Final  Blood culture (routine x 2)     Status: None   Collection Time: 01/30/18  3:30 PM  Result Value Ref Range Status   Specimen Description   Final  BLOOD BLOOD RIGHT HAND Performed at [facility], [address] [place], [place] [address]    Special Requests   Final    IN PEDIATRIC BOTTLE Blood Culture adequate volume Performed at [facility], [address] [place], [place] [address]    Culture   Final    NO GROWTH 5 DAYS Performed at [facility], [address], [place], [place] [address]    Report Status 02/04/2018 FINAL  Final      Radiology Studies: No results found.  Scheduled Meds: . amLODipine  5 mg Oral Daily  . ARIPiprazole  2 mg Oral Daily  . ezetimibe  10 mg Oral Daily  . famotidine  20 mg Oral Daily  . gabapentin  300 mg Oral QHS  . heparin  40 Units/kg Dialysis Once in dialysis  . levothyroxine  25 mcg Oral QAC breakfast  . polyethylene glycol  17 g Oral Daily  . senna-docusate  1 tablet Oral BID  . sodium chloride flush  3 mL Intravenous Q12H  . topiramate  100 mg Oral BID  . venlafaxine XR  75 mg Oral Q breakfast   Continuous Infusions: . sodium chloride    . sodium chloride    . sodium chloride    . heparin 1,100 Units/hr (02/09/18 0125)     LOS: 9 days    Time spent: 15 min  Penny Pia, MD Pager: Text Page via  [email or web address]   If 7PM-7AM, please contact night-coverage [email or web address] 02/09/2018, 2:23 PM

## 2018-02-09 NOTE — Procedures (Signed)
I was present at this session.  I have reviewed the session itself and made appropriate changes.  HD via temp, cath, tol well. 2nd HD, low flows . Going for 2.5 Heather AlimentL Heather Hanson 3/2/20196:03 PM

## 2018-02-09 NOTE — Progress Notes (Signed)
   Daily Progress Note   Assessment/Planning:   POD #9 s/p Angiojet L arm and central DVT complicated by AKI    L arm swelling greatly improved from pre-procedure  Hopefully AKI improves.  Oliguric yesterday.  Most Angiojet related AKI resolve with time.  Pt got 200 sec (or 50% of maximal time for her intervention with a smaller catheter vs the Xalente, so this result is somewhat unexpected)  Appreciate input from Nephrology   Subjective  - 9 Days Post-Op   Multiple vague complaints   Objective   Vitals:   02/08/18 1945 02/09/18 0355 02/09/18 0641 02/09/18 0802  BP: (!) 148/80 (!) 151/73  (!) 176/94  Pulse:  78  79  Resp: 20 20    Temp: 97.8 F (36.6 C) 97.9 F (36.6 C)    TempSrc: Oral Oral    SpO2: 100% 100%    Weight:   242 lb (109.8 kg)   Height:         Intake/Output Summary (Last 24 hours) at 02/09/2018 0840 Last data filed at 02/09/2018 0600 Gross per 24 hour  Intake 357 ml  Output 900 ml  Net -543 ml    VASC No hematoma in L arm, soft arm with greatly improved edema    Laboratory   CBC CBC Latest Ref Rng & Units 02/09/2018 02/08/2018 02/07/2018  WBC 4.0 - 10.5 K/uL 6.7 6.8 7.1  Hemoglobin 12.0 - 15.0 g/dL 1.6(X8.7(L) 8.1(L) 8.4(L)  Hematocrit 36.0 - 46.0 % 27.5(L) 25.9(L) 26.9(L)  Platelets 150 - 400 K/uL 169 169 166    BMET    Component Value Date/Time   NA 143 02/09/2018 0442   NA 143 06/09/2013 1118   K 4.1 02/09/2018 0442   K 3.7 06/09/2013 1118   CL 109 02/09/2018 0442   CL 108 06/09/2013 1118   CO2 21 (L) 02/09/2018 0442   CO2 27 06/09/2013 1118   GLUCOSE 75 02/09/2018 0442   GLUCOSE 97 06/09/2013 1118   BUN 33 (H) 02/09/2018 0442   BUN 12 06/09/2013 1118   CREATININE 11.03 (H) 02/09/2018 0442   CREATININE 1.09 03/09/2014 0745   CALCIUM 8.3 (L) 02/09/2018 0442   CALCIUM 9.6 06/09/2013 1118   GFRNONAA 3 (L) 02/09/2018 0442   GFRAA 4 (L) 02/09/2018 0442     Leonides SakeBrian Analaya Hoey, MD, FACS [facility] Office:  [phone] Pager: [phone]  02/09/2018, 8:40 AM

## 2018-02-09 NOTE — Progress Notes (Signed)
Subjective: Interval History: has complaints still blood in urine.  Feet swollen.  Objective: Vital signs in last 24 hours: Temp:  [97.8 F (36.6 C)-97.9 F (36.6 C)] 97.9 F (36.6 C) (03/02 0355) Pulse Rate:  [78-79] 79 (03/02 0802) Resp:  [19-20] 20 (03/02 0355) BP: (148-176)/(73-94) 176/94 (03/02 0802) SpO2:  [99 %-100 %] 100 % (03/02 0355) Weight:  [109.8 kg (242 lb)] 109.8 kg (242 lb) (03/02 0641) Weight change: -3.221 kg (-1.6 oz)  Intake/Output from previous day: 03/01 0701 - 03/02 0700 In: 357 [P.O.:236; I.V.:121] Out: 900 [Urine:900] Intake/Output this shift: No intake/output data recorded.  Neck: RIJ temp cath Resp: diminished breath sounds bilaterally Cardio: S1, S2 normal and systolic murmur: holosystolic 2/6, blowing at apex GI: obese, pos bs Extremities: Homans sign is negative, no sign of DVT  2+ edema.  Lab Results: Recent Labs    02/08/18 0333 02/09/18 0442  WBC 6.8 6.7  HGB 8.1* 8.7*  HCT 25.9* 27.5*  PLT 169 169   BMET:  Recent Labs    02/08/18 0333 02/09/18 0442  NA 140 143  K 4.0 4.1  CL 108 109  CO2 21* 21*  GLUCOSE 76 75  BUN 29* 33*  CREATININE 9.67* 11.03*  CALCIUM 8.0* 8.3*   No results for input(s): PTH in the last 72 hours. Iron Studies: No results for input(s): IRON, TIBC, TRANSFERRIN, FERRITIN in the last 72 hours.  Studies/Results: No results found.  I have reviewed the patient's current medications.  Assessment/Plan: 1 AKI Cr rising, ??I&O.  Needs lower solute, vol.  Mild uremia, acidemia, will do HD 2 Anemia stable 3 Clot L arm anticoag 4 Obesity 5 Hematomas P Hd, follow chem, vol,     LOS: 9 days   Heather Hanson 02/09/2018,9:40 AM

## 2018-02-10 LAB — CBC
HEMATOCRIT: 30.9 % — AB (ref 36.0–46.0)
Hemoglobin: 9.9 g/dL — ABNORMAL LOW (ref 12.0–15.0)
MCH: 22.3 pg — AB (ref 26.0–34.0)
MCHC: 32 g/dL (ref 30.0–36.0)
MCV: 69.6 fL — AB (ref 78.0–100.0)
Platelets: 103 10*3/uL — ABNORMAL LOW (ref 150–400)
RBC: 4.44 MIL/uL (ref 3.87–5.11)
RDW: 17.3 % — AB (ref 11.5–15.5)
WBC: 7.6 10*3/uL (ref 4.0–10.5)

## 2018-02-10 LAB — RENAL FUNCTION PANEL
Albumin: 3.4 g/dL — ABNORMAL LOW (ref 3.5–5.0)
Anion gap: 14 (ref 5–15)
BUN: 24 mg/dL — ABNORMAL HIGH (ref 6–20)
CHLORIDE: 102 mmol/L (ref 101–111)
CO2: 22 mmol/L (ref 22–32)
Calcium: 8.2 mg/dL — ABNORMAL LOW (ref 8.9–10.3)
Creatinine, Ser: 8.6 mg/dL — ABNORMAL HIGH (ref 0.44–1.00)
GFR calc non Af Amer: 5 mL/min — ABNORMAL LOW (ref >60)
GFR, EST AFRICAN AMERICAN: 5 mL/min — AB (ref >60)
GLUCOSE: 83 mg/dL (ref 65–99)
POTASSIUM: 3.8 mmol/L (ref 3.5–5.1)
Phosphorus: 4.8 mg/dL — ABNORMAL HIGH (ref 2.5–4.6)
SODIUM: 138 mmol/L (ref 135–145)

## 2018-02-10 LAB — HEPARIN LEVEL (UNFRACTIONATED): Heparin Unfractionated: 0.59 IU/mL (ref 0.30–0.70)

## 2018-02-10 LAB — GLUCOSE, CAPILLARY: Glucose-Capillary: 88 mg/dL (ref 65–99)

## 2018-02-10 NOTE — Progress Notes (Signed)
PROGRESS NOTE [facility]   Heather Hanson   AOZ:[id number] DOB: [birth year]  DOA: 01/30/2018 PCP: Macy MisBriscoe, Kim K, MD   Brief Narrative:  Heather Hanson is a 57 year old female with medical history significant for hyperlipidemia, GERD, hypothyroidism, depression and anxiety who presented to the emergency department complaining of left upper arm pain and swelling.  Upon ED evaluation patient was found to have left upper arm swelling venous Doppler showed deep venous thrombosis involving the brachial veins in the subclavians with a thrombus extending.  Also positive for superficial venous thrombosis involving the left basilic vein.  Vascular surgery was consulted and recommended admission for possible TPA.  Subjective: No acute issues overnight.  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  - Transition to coumadin if renal condition does not improve. Will wait.  - Vascular on board.   Acute kidney injury/ATN from contrast-induced nephropathy - Secondary to AngioJet thrombolysis, venogram on 2/21 and CT angiogram of the chest on admission, kidney function was normal prior to that - Non Oliguric ATN - Nephrology on board and plans are for dialysis session.  Left upper chest/Breast small hematoma -likely hematoma from recent procedure and anticoagulation  Anemia -stable, continue to monitor  Acute on chronic dysphagia /esophageal stricture -chronic problem, worse with cold liquids  -Barium esophagram concerning for possible stricture, able to tolerate current diet -Would benefit from GI workup pending improvement in kidney function, has been seen and scoped by Dr. Loreta AveMann in the past, will defer this to outpatient  Hx of seizures  Asymptomatic  Continue Topomax   HLD  Continue Zocor    Depression and Anxiety  Continue home medications, doses lowered for GFR -xananx PRN  DVT prophylaxis: Heparin ggt  Code Status: Full Code  Family Communication: None at bedside  Disposition Plan: Home pending improvement in kidney function  Consultants:   Vascular surgery   Nephrology   Procedures:   TPA   Antimicrobials: Anti-infectives (From admission, onward)   Start     Dose/Rate Route Frequency Ordered Stop   02/02/18 1200  fluconazole (DIFLUCAN) tablet 100 mg  Status:  Discontinued     100 mg Oral Daily 02/02/18 1155 02/07/18 0913   01/30/18 1515  clindamycin (CLEOCIN) IVPB 900 mg     900 mg 100 mL/hr over 30 Minutes Intravenous  Once 01/30/18 1510 01/30/18 1608          Objective: Vitals:   02/09/18 2016 02/09/18 2108 02/10/18 0450 02/10/18 0754  BP: 123/77 120/70 (!) 161/85 (!) 159/61  Pulse: 68 75 73 72  Resp: 17 17 16    Temp: 98 F (36.7 C) 97.8 F (36.6 C) 98.7 F (37.1 C)   TempSrc: Oral Oral Oral   SpO2:  99% 98%   Weight: 105.9 kg (233 lb 7.5 oz)     Height:        Intake/Output Summary (Last 24 hours) at 02/10/2018 1618 Last data filed at 02/09/2018 1952 Gross per 24 hour  Intake 360 ml  Output 3308 ml  Net -2948 ml   Filed Weights   02/08/18 0300 02/09/18 0641 02/09/18 2016  Weight: 113 kg (249 lb 1.6 oz) 109.8 kg (242 lb) 105.9 kg (233 lb 7.5 oz)    Examination: Exam unchanged when compared to 02/09/2018 Gen: Awake, Alert, Oriented X 3, no distress  HEENT: PERRLA, Neck supple, no JVD Lungs: CTAB, equal chest rise. Chest: small quarter sized oval mass in left upper breast/chest slightly tender-unchanged from 2/25 CVS: RRR,No Gallops,Rubs or new Murmurs Abd: soft, Non tender, non distended, BS present Extremities: mild left arm swelling Skin: no new rashes Psychiatry:  Mood & affect appropriate.    Data Reviewed: I have personally reviewed following labs and imaging studies  CBC: Recent Labs  Lab 02/06/18 0246 02/07/18 0610  02/08/18 0333 02/09/18 0442 02/10/18 0533  WBC 5.8 7.1 6.8 6.7 7.6  HGB 8.5* 8.4* 8.1* 8.7* 9.9*  HCT 26.9* 26.9* 25.9* 27.5* 30.9*  MCV 70.4* 69.0* 69.1* 69.6* 69.6*  PLT 170 166 169 169 103*   Basic Metabolic Panel: Recent Labs  Lab 02/06/18 0246 02/07/18 0046 02/08/18 0333 02/09/18 0442 02/10/18 0533  NA 139 139 140 143 138  K 4.4 4.3 4.0 4.1 3.8  CL 111 112* 108 109 102  CO2 17* 16* 21* 21* 22  GLUCOSE 83 85 76 75 83  BUN 35* 40* 29* 33* 24*  CREATININE 11.71* 12.26* 9.67* 11.03* 8.60*  CALCIUM 8.2* 7.6* 8.0* 8.3* 8.2*  PHOS 5.8* 6.2* 4.7* 5.6* 4.8*   GFR: Estimated Creatinine Clearance: 9.2 mL/min (A) (by C-G formula based on SCr of 8.6 mg/dL (H)). Liver Function Tests: Recent Labs  Lab 02/06/18 0246 02/07/18 0046 02/07/18 0048 02/08/18 0333 02/09/18 0442 02/10/18 0533  ALT  --   --  47  --   --   --   ALBUMIN 3.1* 2.9*  --  3.0* 3.2* 3.4*   No results for input(s): LIPASE, AMYLASE in the last 168 hours. No results for input(s): AMMONIA in the last 168 hours. Coagulation Profile: No results for input(s): INR, PROTIME in the last 168 hours. Cardiac Enzymes: No results for input(s): CKTOTAL, CKMB, CKMBINDEX, TROPONINI in the last 168 hours. BNP (last 3 results) No results for input(s): PROBNP in the last 8760 hours. HbA1C: No results for input(s): HGBA1C in the last 72 hours. CBG: Recent Labs  Lab 02/06/18 0609 02/07/18 0622 02/08/18 0555 02/09/18 0612 02/10/18 0633  GLUCAP 77 66 89 77 88   Lipid Profile: No results for input(s): CHOL, HDL, LDLCALC, TRIG, CHOLHDL, LDLDIRECT in the last 72 hours. Thyroid Function Tests: No results for input(s): TSH, T4TOTAL, FREET4, T3FREE, THYROIDAB in the last 72 hours. Anemia Panel: No results for input(s): VITAMINB12, FOLATE, FERRITIN, TIBC, IRON, RETICCTPCT in the last 72 hours. Sepsis Labs: No results for input(s): PROCALCITON, LATICACIDVEN in the last 168 hours.  No results found for this or any previous  visit (from the past 240 hour(s)).    Radiology Studies: No results found.  Scheduled Meds: . amLODipine  5 mg Oral Daily  . ARIPiprazole  2 mg Oral Daily  . ezetimibe  10 mg Oral Daily  . famotidine  20 mg Oral Daily  . gabapentin  300 mg Oral QHS  . heparin  40 Units/kg Dialysis Once in dialysis  . levothyroxine  25 mcg Oral QAC breakfast  . polyethylene glycol  17 g Oral Daily  . senna-docusate  1 tablet Oral BID  . sodium chloride flush  3 mL Intravenous Q12H  . topiramate  100 mg Oral BID  . venlafaxine XR  75 mg Oral Q breakfast   Continuous Infusions: . sodium chloride    . sodium chloride    . sodium chloride    . sodium chloride    . sodium chloride    . heparin 1,100 Units/hr (  02/09/18 2130)     LOS: 10 days    Time spent: 15 min  Penny Pia, MD Pager: Text Page via [email or web address]   If 7PM-7AM, please contact night-coverage [email or web address] 02/10/2018, 4:18 PM

## 2018-02-10 NOTE — Plan of Care (Signed)
  Progressing Health Behavior/Discharge Planning: Ability to manage health-related needs will improve 02/10/2018 0137 - Progressing by Jill SideNiemela, Berthel Bagnall R, RN Clinical Measurements: Will remain free from infection 02/10/2018 0137 - Progressing by Jill SideNiemela, Cherysh Epperly R, RN Coping: Level of anxiety will decrease 02/10/2018 0137 - Progressing by Jill SideNiemela, Simmie Camerer R, RN Skin Integrity: Risk for impaired skin integrity will decrease 02/10/2018 0137 - Progressing by Jill SideNiemela, Arlyne Brandes R, RN

## 2018-02-10 NOTE — Progress Notes (Signed)
Subjective: Interval History: has complaints LBP on R.  Objective: Vital signs in last 24 hours: Temp:  [97.1 F (36.2 C)-98.7 F (37.1 C)] 98.7 F (37.1 C) (03/03 0450) Pulse Rate:  [68-84] 72 (03/03 0754) Resp:  [16-18] 16 (03/03 0450) BP: (120-161)/(61-86) 159/61 (03/03 0754) SpO2:  [98 %-99 %] 98 % (03/03 0450) Weight:  [105.9 kg (233 lb 7.5 oz)] 105.9 kg (233 lb 7.5 oz) (03/02 2016) Weight change: -3.871 kg (-8.5 oz)  Intake/Output from previous day: 03/02 0701 - 03/03 0700 In: 1299 [P.O.:1200; I.V.:99] Out: 3958 [Urine:1450] Intake/Output this shift: No intake/output data recorded.  General appearance: alert, cooperative, no distress and moderately obese Neck: RIJ temp cath Resp: clear to auscultation bilaterally Cardio: S1, S2 normal and systolic murmur: holosystolic 2/6, blowing at apex GI: obese, pos bs, soft, Extremities: edema 1+  Lab Results: Recent Labs    02/09/18 0442 02/10/18 0533  WBC 6.7 7.6  HGB 8.7* 9.9*  HCT 27.5* 30.9*  PLT 169 103*   BMET:  Recent Labs    02/09/18 0442 02/10/18 0533  NA 143 138  K 4.1 3.8  CL 109 102  CO2 21* 22  GLUCOSE 75 83  BUN 33* 24*  CREATININE 11.03* 8.60*  CALCIUM 8.3* 8.2*   No results for input(s): PTH in the last 72 hours. Iron Studies: No results for input(s): IRON, TIBC, TRANSFERRIN, FERRITIN in the last 72 hours.  Studies/Results: No results found.  I have reviewed the patient's current medications.  Assessment/Plan: 1 AKI  Nonoliguric pigment Nephropathy.  Hd yest, check chem in am. Vol ok 2 Clot L arm stable on Hep 3 Obesity 4 Fibromyalgia 5 Anemia stable 6 hematomas stable P follow chem, urine, avoid nephrotoxins   LOS: 10 days   Fayrene FearingJames Dany Walther 02/10/2018,9:02 AM

## 2018-02-10 NOTE — Progress Notes (Signed)
ANTICOAGULATION CONSULT NOTE - Follow Up Consult  Pharmacy Consult for Heparin Indication: DVT  Allergies  Allergen Reactions  . Aspirin Other (See Comments)    Per Dr  . Augmentin [Amoxicillin-Pot Clavulanate] Other (See Comments)    Blisters in the back of throat  . Dexilant [Dexlansoprazole] Other (See Comments)    Bowel urgency and liquid stools  . Hydrocodone-Acetaminophen Hives    REACTION: Rash  . Influenza Vaccines Swelling    Arm swelled to the size of a grapefruit  . Levsin [Hyoscyamine Sulfate] Other (See Comments)    Projectile vomitting  . Linzess [Linaclotide] Other (See Comments)    Bowel urgency and liquid stools  . Promethazine Nausea And Vomiting  . Propoxyphene N-Acetaminophen Itching    REACTION: rash  . Statins Other (See Comments)    myalgia  . Acetaminophen Itching and Rash  . Latex Itching and Rash    Rash-looks like she has the measles per pt  . Oxycodone-Acetaminophen Itching and Rash    REACTION: Rash    Patient Measurements: Height: 5\' 9"  (175.3 cm) Weight: 233 lb 7.5 oz (105.9 kg) IBW/kg (Calculated) : 66.2 Heparin Dosing Weight:  89.5 kg  Vital Signs: Temp: 98.7 F (37.1 C) (03/03 0450) Temp Source: Oral (03/03 0450) BP: 159/61 (03/03 0754) Pulse Rate: 72 (03/03 0754)  Labs: Recent Labs    02/08/18 0333  02/08/18 2222 02/09/18 0442 02/10/18 0533  HGB 8.1*  --   --  8.7* 9.9*  HCT 25.9*  --   --  27.5* 30.9*  PLT 169  --   --  169 103*  HEPARINUNFRC 0.28*   < > 0.45 0.56 0.59  CREATININE 9.67*  --   --  11.03* 8.60*   < > = values in this interval not displayed.    Estimated Creatinine Clearance: 9.2 mL/min (A) (by C-G formula based on SCr of 8.6 mg/dL (H)).  Assessment: 56 yof continuing on heparin for extensive DVT of unknown etiology, s/p lysis 2/21. C/o another bleed in in L thigh and new mass on the back of L leg (some bruising and a small hematoma on the back of her left leg) another small palpable mass on the left  posterior thigh as well. Some hematuria noted 2/25 and ongoing per MD notes.   Heparin level therapeutic. No issues with bleeding documented. hg low but stable, plt down to 103.  Goal of Therapy:  Heparin level 0.3-0.7 units/ml Monitor platelets by anticoagulation protocol: Yes   Plan:  - Continue IV heparin at 1100 units/hr - Heparin level daily with CBC daily - Monitor for increased s/sx bleeding - F/u plans for PO anticoagulation as appropriate  Babs BertinHaley Kameran Lallier, PharmD, BCPS Clinical Pharmacist Clinical phone for 02/10/2018 until 3:30pm: [phone] If after 3:30pm, please call main pharmacy at: [phone] 02/10/2018 10:21 AM

## 2018-02-11 LAB — RENAL FUNCTION PANEL
ALBUMIN: 3.4 g/dL — AB (ref 3.5–5.0)
Anion gap: 14 (ref 5–15)
BUN: 32 mg/dL — AB (ref 6–20)
CALCIUM: 8.4 mg/dL — AB (ref 8.9–10.3)
CO2: 22 mmol/L (ref 22–32)
CREATININE: 9.77 mg/dL — AB (ref 0.44–1.00)
Chloride: 104 mmol/L (ref 101–111)
GFR, EST AFRICAN AMERICAN: 4 mL/min — AB (ref >60)
GFR, EST NON AFRICAN AMERICAN: 4 mL/min — AB (ref >60)
Glucose, Bld: 92 mg/dL (ref 65–99)
PHOSPHORUS: 5.3 mg/dL — AB (ref 2.5–4.6)
Potassium: 3.7 mmol/L (ref 3.5–5.1)
SODIUM: 140 mmol/L (ref 135–145)

## 2018-02-11 LAB — CBC
HCT: 29.1 % — ABNORMAL LOW (ref 36.0–46.0)
Hemoglobin: 9 g/dL — ABNORMAL LOW (ref 12.0–15.0)
MCH: 21.7 pg — ABNORMAL LOW (ref 26.0–34.0)
MCHC: 30.9 g/dL (ref 30.0–36.0)
MCV: 70.1 fL — ABNORMAL LOW (ref 78.0–100.0)
PLATELETS: 112 10*3/uL — AB (ref 150–400)
RBC: 4.15 MIL/uL (ref 3.87–5.11)
RDW: 17.2 % — AB (ref 11.5–15.5)
WBC: 7.2 10*3/uL (ref 4.0–10.5)

## 2018-02-11 LAB — GLUCOSE, CAPILLARY: GLUCOSE-CAPILLARY: 92 mg/dL (ref 65–99)

## 2018-02-11 LAB — HEPARIN LEVEL (UNFRACTIONATED): HEPARIN UNFRACTIONATED: 0.62 [IU]/mL (ref 0.30–0.70)

## 2018-02-11 NOTE — Progress Notes (Addendum)
[facility]  Subjective  - Doing better over all.     Objective (!) 145/78 69 98.4 F (36.9 C) (Oral) 15 97%  Intake/Output Summary (Last 24 hours) at 02/11/2018 0725 Last data filed at 02/11/2018 0518 Gross per 24 hour  Intake 636 ml  Output 2800 ml  Net -2164 ml    Left UE with decreased edema.  N/V/M intact Right temp cath in place  Heart RRR Lungs non labored breathing   Assessment/Planning: POD # 10 s/p Angiojet L arm and central DVT complicated by AKI Heparin IV VTE treatment Cr 9.77 this am related to angiojet procedure started HD.      Mosetta Pigeonmma Maureen Collins 02/11/2018 7:25 AM --  Laboratory Lab Results: Recent Labs    02/09/18 0442 02/10/18 0533  WBC 6.7 7.6  HGB 8.7* 9.9*  HCT 27.5* 30.9*  PLT 169 103*   BMET Recent Labs    02/10/18 0533 02/11/18 0426  NA 138 140  K 3.8 3.7  CL 102 104  CO2 22 22  GLUCOSE 83 92  BUN 24* 32*  CREATININE 8.60* 9.77*  CALCIUM 8.2* 8.4*    COAG Lab Results  Component Value Date   INR 0.91 01/30/2018   No results found for: PTT  I agree with the above.  I have seen and evaluated the patient.  She continues to suffer from ATN.  In our experience this tends to resolve within 2-3 weeks.  Consider placement of a tunneled dialysis catheter so that this can be monitored from an outpatient perspective  Durene CalWells Brabham

## 2018-02-11 NOTE — Progress Notes (Signed)
PROGRESS NOTE [facility]   Heather Hanson   ZOX:[id number] DOB: 07/30/1961  DOA: 01/30/2018 PCP: Macy Mis, MD   Brief Narrative:  Heather Hanson is a 57 year old female with medical history significant for hyperlipidemia, GERD, hypothyroidism, depression and anxiety who presented to the emergency department complaining of left upper arm pain and swelling.  Upon ED evaluation patient was found to have left upper arm swelling venous Doppler showed deep venous thrombosis involving the brachial veins in the subclavians with a thrombus extending.  Also positive for superficial venous thrombosis involving the left basilic vein.  Vascular surgery was consulted and recommended admission for possible TPA.  Subjective: No acute issues overnight.   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  - Transition to coumadin if renal condition does not improve. Will wait.  - Vascular on board.   Acute kidney injury/ATN from contrast-induced nephropathy - Secondary to AngioJet thrombolysis, venogram on 2/21 and CT angiogram of the chest on admission, kidney function was normal prior to that - Non Oliguric ATN - Nephrology Managing. Hopes are that patients kidneys recover which it is suspected to recover it but recovery times vary. Patient requires as such inpatient monitoring and evaluation to correct any metabolic abnormalities that may arise as a result of her kidney injury  Left upper chest/Breast small hematoma -likely hematoma from recent procedure and anticoagulation  Anemia -stable, continue to monitor  Acute on chronic dysphagia /esophageal stricture -chronic problem, worse with cold liquids  -Barium esophagram concerning for possible stricture, able to tolerate current diet -Would benefit  from GI workup pending improvement in kidney function, has been seen and scoped by Dr. Loreta Ave in the past, will defer this to outpatient  Hx of seizures  Asymptomatic  Continue Topomax   HLD  Continue Zocor   Depression and Anxiety  Continue home medications, doses lowered for GFR -xananx PRN  DVT prophylaxis: Heparin ggt  Code Status: Full Code  Family Communication: None at bedside  Disposition Plan: Home pending improvement in kidney function  Consultants:   Vascular surgery   Nephrology   Procedures:   TPA   Antimicrobials: Anti-infectives (From admission, onward)   Start     Dose/Rate Route Frequency Ordered Stop   02/02/18 1200  fluconazole (DIFLUCAN) tablet 100 mg  Status:  Discontinued     100 mg Oral Daily 02/02/18 1155 02/07/18 0913   01/30/18 1515  clindamycin (CLEOCIN) IVPB 900 mg     900 mg 100 mL/hr over 30 Minutes Intravenous  Once 01/30/18 1510 01/30/18 1608          Objective: Vitals:   02/10/18 0754 02/10/18 2105 02/11/18 0601 02/11/18 1210  BP: (!) 159/61 (!) 156/80 (!) 145/78 (!) 148/78  Pulse: 72 73 69 95  Resp:  12 15 18   Temp:  98.1 F (36.7 C) 98.4 F (36.9 C) 98.3 F (36.8 C)  TempSrc:  Oral Oral Oral  SpO2:  99% 97% 98%  Weight:      Height:        Intake/Output Summary (Last 24 hours) at 02/11/2018 1737 Last data filed at 02/11/2018 1732 Gross per 24 hour  Intake 975 ml  Output 4100 ml  Net -3125 ml   Filed Weights   02/08/18 0300 02/09/18 0641 02/09/18 2016  Weight: 113 kg (249  lb 1.6 oz) 109.8 kg (242 lb) 105.9 kg (233 lb 7.5 oz)    Examination: Exam unchanged when compared to 02/10/2018 Gen: Awake, Alert, Oriented X 3, no distress HEENT: PERRLA, Neck supple, no JVD Lungs: CTAB, equal chest rise. Chest: small quarter sized oval mass in left upper breast/chest slightly tender-unchanged from 2/25 CVS: RRR,No Gallops,Rubs or new Murmurs Abd: soft, Non tender, non distended, BS present Extremities: mild left arm  swelling Skin: no new rashes Psychiatry:  Mood & affect appropriate.    Data Reviewed: I have personally reviewed following labs and imaging studies  CBC: Recent Labs  Lab 02/07/18 0610 02/08/18 0333 02/09/18 0442 02/10/18 0533 02/11/18 0426  WBC 7.1 6.8 6.7 7.6 7.2  HGB 8.4* 8.1* 8.7* 9.9* 9.0*  HCT 26.9* 25.9* 27.5* 30.9* 29.1*  MCV 69.0* 69.1* 69.6* 69.6* 70.1*  PLT 166 169 169 103* 112*   Basic Metabolic Panel: Recent Labs  Lab 02/07/18 0046 02/08/18 0333 02/09/18 0442 02/10/18 0533 02/11/18 0426  NA 139 140 143 138 140  K 4.3 4.0 4.1 3.8 3.7  CL 112* 108 109 102 104  CO2 16* 21* 21* 22 22  GLUCOSE 85 76 75 83 92  BUN 40* 29* 33* 24* 32*  CREATININE 12.26* 9.67* 11.03* 8.60* 9.77*  CALCIUM 7.6* 8.0* 8.3* 8.2* 8.4*  PHOS 6.2* 4.7* 5.6* 4.8* 5.3*   GFR: Estimated Creatinine Clearance: 8.1 mL/min (A) (by C-G formula based on SCr of 9.77 mg/dL (H)). Liver Function Tests: Recent Labs  Lab 02/07/18 0046 02/07/18 0048 02/08/18 0333 02/09/18 0442 02/10/18 0533 02/11/18 0426  ALT  --  47  --   --   --   --   ALBUMIN 2.9*  --  3.0* 3.2* 3.4* 3.4*   No results for input(s): LIPASE, AMYLASE in the last 168 hours. No results for input(s): AMMONIA in the last 168 hours. Coagulation Profile: No results for input(s): INR, PROTIME in the last 168 hours. Cardiac Enzymes: No results for input(s): CKTOTAL, CKMB, CKMBINDEX, TROPONINI in the last 168 hours. BNP (last 3 results) No results for input(s): PROBNP in the last 8760 hours. HbA1C: No results for input(s): HGBA1C in the last 72 hours. CBG: Recent Labs  Lab 02/07/18 0622 02/08/18 0555 02/09/18 0612 02/10/18 0633 02/11/18 0600  GLUCAP 66 89 77 88 92   Lipid Profile: No results for input(s): CHOL, HDL, LDLCALC, TRIG, CHOLHDL, LDLDIRECT in the last 72 hours. Thyroid Function Tests: No results for input(s): TSH, T4TOTAL, FREET4, T3FREE, THYROIDAB in the last 72 hours. Anemia Panel: No results for  input(s): VITAMINB12, FOLATE, FERRITIN, TIBC, IRON, RETICCTPCT in the last 72 hours. Sepsis Labs: No results for input(s): PROCALCITON, LATICACIDVEN in the last 168 hours.  No results found for this or any previous visit (from the past 240 hour(s)).    Radiology Studies: No results found.  Scheduled Meds: . amLODipine  5 mg Oral Daily  . ARIPiprazole  2 mg Oral Daily  . ezetimibe  10 mg Oral Daily  . famotidine  20 mg Oral Daily  . gabapentin  300 mg Oral QHS  . heparin  40 Units/kg Dialysis Once in dialysis  . levothyroxine  25 mcg Oral QAC breakfast  . polyethylene glycol  17 g Oral Daily  . senna-docusate  1 tablet Oral BID  . sodium chloride flush  3 mL Intravenous Q12H  . topiramate  100 mg Oral BID  . venlafaxine XR  75 mg Oral Q breakfast   Continuous Infusions: . sodium chloride    .  sodium chloride    . sodium chloride    . sodium chloride    . sodium chloride    . heparin 1,100 Units/hr (02/11/18 1625)     LOS: 11 days    Time spent: 15 min  Penny Pia, MD Pager: Text Page via [email or web address]   If 7PM-7AM, please contact night-coverage [email or web address] 02/11/2018, 5:37 PM

## 2018-02-11 NOTE — Progress Notes (Signed)
[facility] Rounding Note  Subjective/Interval History:  Making a lot of urine, creatinine rising post HD on 3/2 (HD 2/27, 3/2; has R temp cath) Says her "bladder is sore"  Objective Vital signs in last 24 hours: Vitals:   02/10/18 0450 02/10/18 0754 02/10/18 2105 02/11/18 0601  BP: (!) 161/85 (!) 159/61 (!) 156/80 (!) 145/78  Pulse: 73 72 73 69  Resp: 16  12 15   Temp: 98.7 F (37.1 C)  98.1 F (36.7 C) 98.4 F (36.9 C)  TempSrc: Oral  Oral Oral  SpO2: 98%  99% 97%  Weight:      Height:       Weight change:   Intake/Output Summary (Last 24 hours) at 02/11/2018 1050 Last data filed at 02/11/2018 0820 Gross per 24 hour  Intake 636 ml  Output 3200 ml  Net -2564 ml   Physical Exam:  Blood pressure (!) 145/78, pulse 69, temperature 98.4 F (36.9 C), temperature source Oral, resp. rate 15, height 5\' 9"  (1.753 m), weight 105.9 kg (233 lb 7.5 oz), SpO2 97 %.  Very pleasant, NAD L arm some edema (by report much less) Lungs clear S1S2 No S3 Abd soft No LE edema  Recent Labs  Lab 02/05/18 0227 02/06/18 0246 02/07/18 0046 02/08/18 0333 02/09/18 0442 02/10/18 0533 02/11/18 0426  NA 138 139 139 140 143 138 140  K 4.2 4.4 4.3 4.0 4.1 3.8 3.7  CL 112* 111 112* 108 109 102 104  CO2 18* 17* 16* 21* 21* 22 22  GLUCOSE 93 83 85 76 75 83 92  BUN 34* 35* 40* 29* 33* 24* 32*  CREATININE 10.29* 11.71* 12.26* 9.67* 11.03* 8.60* 9.77*  CALCIUM 8.1* 8.2* 7.6* 8.0* 8.3* 8.2* 8.4*  PHOS 5.1* 5.8* 6.2* 4.7* 5.6* 4.8* 5.3*    Recent Labs  Lab 02/07/18 0048  02/09/18 0442 02/10/18 0533 02/11/18 0426  ALT 47  --   --   --   --   ALBUMIN  --    < > 3.2* 3.4* 3.4*   < > = values in this interval not displayed.    Recent Labs  Lab 02/07/18 0610 02/08/18 0333 02/09/18 0442 02/10/18 0533  WBC 7.1 6.8 6.7 7.6  HGB 8.4* 8.1* 8.7* 9.9*  HCT 26.9* 25.9* 27.5* 30.9*  MCV 69.0* 69.1* 69.6* 69.6*  PLT 166 169 169 103*    Recent Labs  Lab 02/07/18 0622 02/08/18 0555 02/09/18 0612  02/10/18 0633 02/11/18 0600  GLUCAP 66 89 77 88 92   Medications: . sodium chloride    . sodium chloride    . sodium chloride    . sodium chloride    . sodium chloride    . heparin 1,100 Units/hr (02/10/18 2053)   . amLODipine  5 mg Oral Daily  . ARIPiprazole  2 mg Oral Daily  . ezetimibe  10 mg Oral Daily  . famotidine  20 mg Oral Daily  . gabapentin  300 mg Oral QHS  . heparin  40 Units/kg Dialysis Once in dialysis  . levothyroxine  25 mcg Oral QAC breakfast  . polyethylene glycol  17 g Oral Daily  . senna-docusate  1 tablet Oral BID  . sodium chloride flush  3 mL Intravenous Q12H  . topiramate  100 mg Oral BID  . venlafaxine XR  75 mg Oral Q breakfast    Assessment/Recommendations  1. AKI - post angioject procedure/venogram/CT angio for LUE DVT. Contrast + pigment nephropathy. Has required 2 HD TMTs, creatinine rising in  interdialytic interval but making good urine. Electrolytes fine. No indication for HD today. Reassess for need tomorrow. (These pts usually recover, but time course for prior cases variable) 2. LUE and central DVT - brachial/subclavian/innominate. Vascular following. Remains on heparin (not sure of need for other procedures yet so not transitioning to coumadin yet) 3. Anemia - Hb stable to sl better 4. Sz disorder - Topomax 5. Dysphagia - poss esoph stricture Ba esophagram 2/22. Outpt eval planned  Camille Bal, MD [facility] [phone] pager 02/11/2018, 10:50 AM

## 2018-02-11 NOTE — Progress Notes (Signed)
ANTICOAGULATION CONSULT NOTE - Follow Up Consult  Pharmacy Consult for Heparin Indication: DVT  Allergies  Allergen Reactions  . Aspirin Other (See Comments)    Per Dr  . Augmentin [Amoxicillin-Pot Clavulanate] Other (See Comments)    Blisters in the back of throat  . Dexilant [Dexlansoprazole] Other (See Comments)    Bowel urgency and liquid stools  . Hydrocodone-Acetaminophen Hives    REACTION: Rash  . Influenza Vaccines Swelling    Arm swelled to the size of a grapefruit  . Levsin [Hyoscyamine Sulfate] Other (See Comments)    Projectile vomitting  . Linzess [Linaclotide] Other (See Comments)    Bowel urgency and liquid stools  . Promethazine Nausea And Vomiting  . Propoxyphene N-Acetaminophen Itching    REACTION: rash  . Statins Other (See Comments)    myalgia  . Acetaminophen Itching and Rash  . Latex Itching and Rash    Rash-looks like she has the measles per pt  . Oxycodone-Acetaminophen Itching and Rash    REACTION: Rash    Patient Measurements: Height: 5\' 9"  (175.3 cm) Weight: 233 lb 7.5 oz (105.9 kg) IBW/kg (Calculated) : 66.2 Heparin Dosing Weight:  89.5 kg  Vital Signs: Temp: 98.4 F (36.9 C) (03/04 0601) Temp Source: Oral (03/04 0601) BP: 145/78 (03/04 0601) Pulse Rate: 69 (03/04 0601)  Labs: Recent Labs    02/09/18 0442 02/10/18 0533 02/11/18 0426  HGB 8.7* 9.9*  --   HCT 27.5* 30.9*  --   PLT 169 103*  --   HEPARINUNFRC 0.56 0.59 0.62  CREATININE 11.03* 8.60* 9.77*    Estimated Creatinine Clearance: 8.1 mL/min (A) (by C-G formula based on SCr of 9.77 mg/dL (H)).  Assessment: 56 yof continuing on heparin for extensive DVT of unknown etiology, s/p lysis 2/21. C/o another bleed in in L thigh and new mass on the back of L leg (some bruising and a small hematoma on the back of her left leg) another small palpable mass on the left posterior thigh as well.   Heparin level is therapeutic at 0.62. No issues with bleeding documented outside some  hematuria (noted 2/25 and ongoing per MD notes and talking to nursing). Hgb has been low but stable, plt down to 103 yesterday - no CBC today.  Goal of Therapy:  Heparin level 0.3-0.7 units/ml Monitor platelets by anticoagulation protocol: Yes   Plan:  - Continue IV heparin at 1100 units/hr - Heparin level daily with CBC daily - Monitor for increased s/sx bleeding - F/u plans for PO anticoagulation as appropriate  Girard CooterKimberly Perkins, PharmD Clinical Pharmacist  Pager: [phone] Clinical Phone for 02/11/2018 until 3:30pm: [phone] If after 3:30pm, please call main pharmacy at [phone] 02/11/2018 8:19 AM

## 2018-02-11 NOTE — Plan of Care (Signed)
  Progressing Health Behavior/Discharge Planning: Ability to manage health-related needs will improve 02/11/2018 2058 - Progressing by Renelda MomWorley, Soleia Badolato L, RN Clinical Measurements: Ability to maintain clinical measurements within normal limits will improve 02/11/2018 2058 - Progressing by Renelda MomWorley, Lebron Nauert L, RN Will remain free from infection 02/11/2018 2058 - Progressing by Renelda MomWorley, Mirian Casco L, RN Diagnostic test results will improve 02/11/2018 2058 - Progressing by Renelda MomWorley, Sergei Delo L, RN

## 2018-02-12 ENCOUNTER — Encounter (HOSPITAL_COMMUNITY): Payer: Self-pay | Admitting: Radiology

## 2018-02-12 ENCOUNTER — Inpatient Hospital Stay (HOSPITAL_COMMUNITY): Payer: Medicaid Other

## 2018-02-12 LAB — RENAL FUNCTION PANEL
ALBUMIN: 3.6 g/dL (ref 3.5–5.0)
ANION GAP: 15 (ref 5–15)
BUN: 38 mg/dL — ABNORMAL HIGH (ref 6–20)
CO2: 24 mmol/L (ref 22–32)
Calcium: 8.7 mg/dL — ABNORMAL LOW (ref 8.9–10.3)
Chloride: 103 mmol/L (ref 101–111)
Creatinine, Ser: 9.96 mg/dL — ABNORMAL HIGH (ref 0.44–1.00)
GFR calc non Af Amer: 4 mL/min — ABNORMAL LOW (ref >60)
GFR, EST AFRICAN AMERICAN: 4 mL/min — AB (ref >60)
Glucose, Bld: 91 mg/dL (ref 65–99)
PHOSPHORUS: 6.7 mg/dL — AB (ref 2.5–4.6)
POTASSIUM: 4.2 mmol/L (ref 3.5–5.1)
SODIUM: 142 mmol/L (ref 135–145)

## 2018-02-12 LAB — CBC
HEMATOCRIT: 29.5 % — AB (ref 36.0–46.0)
HEMOGLOBIN: 9.3 g/dL — AB (ref 12.0–15.0)
MCH: 22 pg — ABNORMAL LOW (ref 26.0–34.0)
MCHC: 31.5 g/dL (ref 30.0–36.0)
MCV: 69.9 fL — ABNORMAL LOW (ref 78.0–100.0)
Platelets: 120 10*3/uL — ABNORMAL LOW (ref 150–400)
RBC: 4.22 MIL/uL (ref 3.87–5.11)
RDW: 16.8 % — ABNORMAL HIGH (ref 11.5–15.5)
WBC: 8.6 10*3/uL (ref 4.0–10.5)

## 2018-02-12 LAB — HEPARIN LEVEL (UNFRACTIONATED)
HEPARIN UNFRACTIONATED: 0.6 [IU]/mL (ref 0.30–0.70)
Heparin Unfractionated: 0.76 IU/mL — ABNORMAL HIGH (ref 0.30–0.70)

## 2018-02-12 LAB — GLUCOSE, CAPILLARY: Glucose-Capillary: 87 mg/dL (ref 65–99)

## 2018-02-12 NOTE — Plan of Care (Signed)
  Progressing Health Behavior/Discharge Planning: Ability to manage health-related needs will improve 02/12/2018 2041 - Progressing by Renelda MomWorley, Kizer Nobbe L, RN Clinical Measurements: Ability to maintain clinical measurements within normal limits will improve 02/12/2018 2041 - Progressing by Renelda MomWorley, Taino Maertens L, RN Will remain free from infection 02/12/2018 2041 - Progressing by Renelda MomWorley, Janely Gullickson L, RN Diagnostic test results will improve 02/12/2018 2041 - Progressing by Renelda MomWorley, Jamilya Sarrazin L, RN

## 2018-02-12 NOTE — Progress Notes (Signed)
ANTICOAGULATION CONSULT NOTE - Follow Up Consult  Pharmacy Consult for Heparin Indication: DVT  Allergies  Allergen Reactions  . Aspirin Other (See Comments)    Per Dr  . Augmentin [Amoxicillin-Pot Clavulanate] Other (See Comments)    Blisters in the back of throat  . Dexilant [Dexlansoprazole] Other (See Comments)    Bowel urgency and liquid stools  . Hydrocodone-Acetaminophen Hives    REACTION: Rash  . Influenza Vaccines Swelling    Arm swelled to the size of a grapefruit  . Levsin [Hyoscyamine Sulfate] Other (See Comments)    Projectile vomitting  . Linzess [Linaclotide] Other (See Comments)    Bowel urgency and liquid stools  . Promethazine Nausea And Vomiting  . Propoxyphene N-Acetaminophen Itching    REACTION: rash  . Statins Other (See Comments)    myalgia  . Acetaminophen Itching and Rash  . Latex Itching and Rash    Rash-looks like she has the measles per pt  . Oxycodone-Acetaminophen Itching and Rash    REACTION: Rash    Patient Measurements: Height: 5\' 9"  (175.3 cm) Weight: 233 lb 7.5 oz (105.9 kg) IBW/kg (Calculated) : 66.2 Heparin Dosing Weight:  89.5 kg  Vital Signs: Temp: 98 F (36.7 C) (03/04 2103) Temp Source: Oral (03/04 2103) BP: 137/93 (03/04 2103) Pulse Rate: 68 (03/04 2103)  Labs: Recent Labs    02/09/18 0442 02/10/18 0533 02/11/18 0426 02/12/18 0332  HGB 8.7* 9.9* 9.0* 9.3*  HCT 27.5* 30.9* 29.1* 29.5*  PLT 169 103* 112* 120*  HEPARINUNFRC 0.56 0.59 0.62 0.76*  CREATININE 11.03* 8.60* 9.77*  --     Estimated Creatinine Clearance: 8.1 mL/min (A) (by C-G formula based on SCr of 9.77 mg/dL (H)).  Assessment: 56 yof continuing on heparin for extensive DVT of unknown etiology, s/p lysis 2/21. C/o another bleed in in L thigh and new mass on the back of L leg (some bruising and a small hematoma on the back of her left leg) another small palpable mass on the left posterior thigh as well.   Heparin level is above goal at 0.76. No issues  with bleeding documented outside some hematuria (noted 2/25 and ongoing per MD notes and talking to nursing). Hgb has been low but stable, plt now up to 120.  Goal of Therapy:  Heparin level 0.3-0.7 units/ml Monitor platelets by anticoagulation protocol: Yes   Plan:  - Reduce heparin to 1000 units/hr - Heparin level daily with CBC daily - Monitor for increased s/sx bleeding - F/u plans for PO anticoagulation as appropriate  Sheppard CoilFrank Sina Lucchesi PharmD., BCPS Clinical Pharmacist 02/12/2018 4:35 AM

## 2018-02-12 NOTE — Progress Notes (Signed)
ANTICOAGULATION CONSULT NOTE - Follow Up Consult  Pharmacy Consult for Heparin Indication: DVT  Allergies  Allergen Reactions  . Aspirin Other (See Comments)    Per Dr  . Augmentin [Amoxicillin-Pot Clavulanate] Other (See Comments)    Blisters in the back of throat  . Dexilant [Dexlansoprazole] Other (See Comments)    Bowel urgency and liquid stools  . Hydrocodone-Acetaminophen Hives    REACTION: Rash  . Influenza Vaccines Swelling    Arm swelled to the size of a grapefruit  . Levsin [Hyoscyamine Sulfate] Other (See Comments)    Projectile vomitting  . Linzess [Linaclotide] Other (See Comments)    Bowel urgency and liquid stools  . Promethazine Nausea And Vomiting  . Propoxyphene N-Acetaminophen Itching    REACTION: rash  . Statins Other (See Comments)    myalgia  . Acetaminophen Itching and Rash  . Latex Itching and Rash    Rash-looks like she has the measles per pt  . Oxycodone-Acetaminophen Itching and Rash    REACTION: Rash    Patient Measurements: Height: 5\' 9"  (175.3 cm) Weight: 220 lb (99.8 kg) IBW/kg (Calculated) : 66.2 Heparin Dosing Weight:  89.5 kg  Vital Signs: Temp: 97.9 F (36.6 C) (03/05 0438) Temp Source: Oral (03/05 1242) BP: 119/82 (03/05 1242) Pulse Rate: 90 (03/05 1242)  Labs: Recent Labs    02/10/18 0533 02/11/18 0426 02/12/18 0332 02/12/18 1306  HGB 9.9* 9.0* 9.3*  --   HCT 30.9* 29.1* 29.5*  --   PLT 103* 112* 120*  --   HEPARINUNFRC 0.59 0.62 0.76* 0.60  CREATININE 8.60* 9.77* 9.96*  --     Estimated Creatinine Clearance: 7.7 mL/min (A) (by C-G formula based on SCr of 9.96 mg/dL (H)).  Assessment: 56 yof continuing on heparin for extensive DVT of unknown etiology, s/p lysis 2/21. C/o another bleed in in L thigh and new mass on the back of L leg (some bruising and a small hematoma on the back of her left leg) another small palpable mass on the left posterior thigh as well.   Heparin level this afternoon is now within goal range at  0.6, on 1000 units/hr. No issues with bleeding documented outside hematuria (noted 2/25 and ongoing per MD notes and talking to nursing) - urology is coming to see patient. Hgb has been low but stable, plt now up to 120.  Goal of Therapy:  Heparin level 0.3-0.7 units/ml Monitor platelets by anticoagulation protocol: Yes   Plan:  - Continue heparin to 1000 units/hr - Heparin level daily with CBC daily - Monitor for s/sx bleeding - F/u plans for PO anticoagulation as appropriate  Girard CooterKimberly Perkins, PharmD Clinical Pharmacist  Pager: [phone] Clinical Phone for 02/12/2018 until 3:30pm: [phone] If after 3:30pm, please call main pharmacy at [phone] 02/12/2018 3:13 PM

## 2018-02-12 NOTE — Consult Note (Signed)
Reason for Consult: Gross Hematuria, Acute Renal Failure  Referring Physician: Velvet Bathe MD  Heather Hanson is an 57 y.o. female.   HPI:   1 - Gross Hematuria - new gross hematuria with clots after being put on heparin as apart of LUE DVT treatment. UA w/o infectious parameters. Renal US 2/23 w/o hydro and preserved ureteral jets. PVR 02/2018 "65m" (acceptable). Non-con CT 02/2018 w/o hydro or large formed clot in renal pelvis / bladder.    2 - Acute Renal Failure - Baseline Cr <1.2, acute rise to 9s after large contrast load (CTA, angiography) as part of work up and treatment of LUE DVT. Still making good urine volume. No hydro on Renal [place].   PMH sig for severe depression / suicide attempts, DVT, HTN  Today "Heather Hanson" is seen in consultation for above, specifically to assess need for possible bladder irrigation.   Past Medical History:  Diagnosis Date  . Adjustment disorder with mixed anxiety and depressed mood 09/28/2009   Qualifier: Diagnosis of  By: YWynona Luna  . Allergic state 04/09/2013  . Anemia    thalassemia  . Anxiety    takes Ativan daily prn  . Arthritis    sees Dr.Beekman  . Bursitis of right shoulder   . Cholecystitis, acute 08/30/2013  . Chronic insomnia   . Complication of anesthesia    hard to sedate  . Constipation    takes Linzess daily prn  . Depression    takes Effexor daily  . Difficulty swallowing solids   . [place]) [place] 01/[place]   LUE  . Fibromyalgia   . GERD (gastroesophageal reflux disease) 06/17/2013   takes Dexilant daily prn  . H. pylori infection 07/19/2013  . H/O hiatal hernia   . Headache(784.0) 05/22/2013  . Hereditary and idiopathic peripheral neuropathy 03/14/2015  . History of bronchitis    last time many yrs ago  . History of colonoscopy 2000  . Hyperlipidemia    not on any meds   . Hypoglycemia   . Hypothyroidism   . Lichen    [facility]   takes Topamax daily-last  migraine today  . Muscle spasms of head and/or neck    takes Norflex daily prn  . Nausea    takes Phenergan prn  . Obesity   . Peripheral edema    takes Lasix prn  . PONV (postoperative nausea and vomiting)    with tonsil removal surgery  . PUD (peptic ulcer disease)    Dr MCollene Mares( H. Pylori)  . [place] ([facility]   presumed secondary to ativan withdrawal -Dr HGaynell Face . [place]izures ([facility]    X 2  . Shortness of breath    pt states when Fibromyalgia flares up  . Sinusitis, acute 04/09/2013   takes Zyrtec daily    Past Surgical History:  Procedure Laterality Date  . ABDOMINAL HYSTERECTOMY  2000   partial  . CHOLECYSTECTOMY N/A 09/16/2013   Procedure: LAPAROSCOPIC CHOLECYSTECTOMY;  Surgeon: MRolm Bookbinder MD;  [place] [facility]  Service: General;  Laterality: N/A;  . COLONOSCOPY    . IR FLUORO GUIDE CV LINE RIGHT  02/06/2018  . IR [place] V[place] ACCESS RIGHT  02/06/2018  . PERIPHERAL VASCULAR THROMBECTOMY Left 01/31/2018   Procedure: PERIPHERAL VASCULAR THROMBECTOMY;  Surgeon: CConrad Enola MD;  [facility];  Service: Cardiovascular;  Laterality: Left;  . REMOVAL OF CERVICAL EXOSTOSIS N/A  06/08/2016   Procedure: REMOVAL OF  EXOSTOSIS ANTERIOR CERVICAL SPINE;  Surgeon: Melina Schools, MD;  Location: [facility];  Service: Orthopedics;  Laterality: N/A;  . TONSILLECTOMY  1972    Family History  Problem Relation Age of Onset  . Diabetes Mother        typer 2  . Heart disease Mother        PVD, PAD  . Cancer Father        prostate, lung, brain mets, smoker  . COPD Father   . Hyperlipidemia Father   . Hypertension Father   . Thyroid disease Father   . Hypertension Sister   . Thyroid disease Sister   . Arthritis Sister   . Diabetes Son 16       type 1  . Diabetes Maternal Grandmother   . Stroke Maternal Grandfather   . Cancer Paternal Grandmother 50       breast  . Alcohol abuse Paternal Grandfather   . Hypertension Sister   . Obesity Sister   .  Psoriasis Son   . Cancer Unknown        Breast < 49 yo, ,Prostate <50 yo  . Coronary artery disease Unknown        <60 yo  . Diabetes Unknown     Social History:  reports that  has never smoked. she has never used smokeless tobacco. She reports that she does not drink alcohol or use drugs.  Allergies:  Allergies  Allergen Reactions  . Aspirin Other (See Comments)    Per Dr  . Augmentin [Amoxicillin-Pot Clavulanate] Other (See Comments)    Blisters in the back of throat  . Dexilant [Dexlansoprazole] Other (See Comments)    Bowel urgency and liquid stools  . Hydrocodone-Acetaminophen Hives    REACTION: Rash  . Influenza Vaccines Swelling    Arm swelled to the size of a grapefruit  . Levsin [Hyoscyamine Sulfate] Other (See Comments)    Projectile vomitting  . Linzess [Linaclotide] Other (See Comments)    Bowel urgency and liquid stools  . Promethazine Nausea And Vomiting  . Propoxyphene N-Acetaminophen Itching    REACTION: rash  . Statins Other (See Comments)    myalgia  . Acetaminophen Itching and Rash  . Latex Itching and Rash    Rash-looks like she has the measles per pt  . Oxycodone-Acetaminophen Itching and Rash    REACTION: Rash    Medications: I have reviewed the patient's current medications.  Results for orders placed or performed during the hospital encounter of 01/30/18 (from the past 48 hour(s))  Heparin level (unfractionated)     Status: None   Collection Time: 02/11/18  4:26 AM  Result Value Ref Range   Heparin Unfractionated 0.62 0.30 - 0.70 IU/mL    Comment:        IF HEPARIN RESULTS ARE BELOW EXPECTED VALUES, AND PATIENT DOSAGE HAS BEEN CONFIRMED, SUGGEST FOLLOW UP TESTING OF ANTITHROMBIN III LEVELS. Performed at [facility], [place] [address], [place], [place] [address]   Renal function panel     Status: Abnormal   Collection Time: 02/11/18  4:26 AM  Result Value Ref Range   Sodium 140 135 - 145 mmol/L   Potassium 3.7 3.5 - 5.1 mmol/L    Chloride 104 101 - 111 mmol/L   CO2 22 22 - 32 mmol/L   Glucose, Bld 92 65 - 99 mg/dL   BUN 32 (H) 6 - 20 mg/dL   Creatinine, Ser 9.77 (H)  0.44 - 1.00 mg/dL   Calcium 8.4 (L) 8.9 - 10.3 mg/dL   Phosphorus 5.3 (H) 2.5 - 4.6 mg/dL   Albumin 3.4 (L) 3.5 - 5.0 g/dL   GFR calc non Af Amer 4 (L) >60 mL/min   GFR calc Af Amer 4 (L) >60 mL/min    Comment: (NOTE) The eGFR has been calculated using the CKD EPI equation. This calculation has not been validated in all clinical situations. eGFR's persistently <60 mL/min signify possible Chronic Kidney Disease.    Anion gap 14 5 - 15    Comment: Performed at [facility] [address], [place], [place] [address]  CBC     Status: Abnormal   Collection Time: 02/11/18  4:26 AM  Result Value Ref Range   WBC 7.2 4.0 - 10.5 K/uL   RBC 4.15 3.87 - 5.11 MIL/uL   Hemoglobin 9.0 (L) 12.0 - 15.0 g/dL   HCT 29.1 (L) 36.0 - 46.0 %   MCV 70.1 (L) 78.0 - 100.0 fL   MCH 21.7 (L) 26.0 - 34.0 pg   MCHC 30.9 30.0 - 36.0 g/dL   RDW 17.2 (H) 11.5 - 15.5 %   Platelets 112 (L) 150 - 400 K/uL    Comment: PLATELET COUNT CONFIRMED BY SMEAR Performed at [facility] [address], [place], [place] [address]   Glucose, capillary     Status: None   Collection Time: 02/11/18  6:00 AM  Result Value Ref Range   Glucose-Capillary 92 65 - 99 mg/dL  Heparin level (unfractionated)     Status: Abnormal   Collection Time: 02/12/18  3:32 AM  Result Value Ref Range   Heparin Unfractionated 0.76 (H) 0.30 - 0.70 IU/mL    Comment:        IF HEPARIN RESULTS ARE BELOW EXPECTED VALUES, AND PATIENT DOSAGE HAS BEEN CONFIRMED, SUGGEST FOLLOW UP TESTING OF ANTITHROMBIN III LEVELS. Performed at [facility], [place] [address] [place], [place] [address]   Renal function panel     Status: Abnormal   Collection Time: 02/12/18  3:32 AM  Result Value Ref Range   Sodium 142 135 - 145 mmol/L   Potassium 4.2 3.5 - 5.1 mmol/L   Chloride 103 101 - 111 mmol/L   CO2  24 22 - 32 mmol/L   Glucose, Bld 91 65 - 99 mg/dL   BUN 38 (H) 6 - 20 mg/dL   Creatinine, Ser 9.96 (H) 0.44 - 1.00 mg/dL   Calcium 8.7 (L) 8.9 - 10.3 mg/dL   Phosphorus 6.7 (H) 2.5 - 4.6 mg/dL   Albumin 3.6 3.5 - 5.0 g/dL   GFR calc non Af Amer 4 (L) >60 mL/min   GFR calc Af Amer 4 (L) >60 mL/min    Comment: (NOTE) The eGFR has been calculated using the CKD EPI equation. This calculation has not been validated in all clinical situations. eGFR's persistently <60 mL/min signify possible Chronic Kidney Disease.    Anion gap 15 5 - 15    Comment: Performed at [facility] [address], [place] [address]  CBC     Status: Abnormal   Collection Time: 02/12/18  3:32 AM  Result Value Ref Range   WBC 8.6 4.0 - 10.5 K/uL   RBC 4.22 3.87 - 5.11 MIL/uL   Hemoglobin 9.3 (L) 12.0 - 15.0 g/dL   HCT 29.5 (L) 36.0 - 46.0 %   MCV 69.9 (L) 78.0 - 100.0 fL  MCH 22.0 (L) 26.0 - 34.0 pg   MCHC 31.5 30.0 - 36.0 g/dL   RDW 16.8 (H) 11.5 - 15.5 %   Platelets 120 (L) 150 - 400 K/uL    Comment: Performed at [facility] [address] [place], [place] [address]  Glucose, capillary     Status: None   Collection Time: 02/12/18  6:26 AM  Result Value Ref Range   Glucose-Capillary 87 65 - 99 mg/dL    No results found.  Review of Systems  Constitutional: Positive for malaise/fatigue.  HENT: Negative.   Eyes: Negative.   Respiratory: Negative.   Cardiovascular: Negative.   Gastrointestinal: Positive for abdominal pain.  Genitourinary: Positive for hematuria and urgency.  Musculoskeletal: Negative.   Skin: Negative.   Neurological: Negative.   Endo/Heme/Allergies: Bruises/bleeds easily.  Psychiatric/Behavioral: Negative.    Blood pressure 119/82, pulse 90, temperature 97.9 F (36.6 C), temperature source Oral, resp. rate 18, height '5\' 9"'  (1.753 m), weight 99.8 kg (220 lb), SpO2 100 %. Physical Exam  Constitutional: She appears well-developed.  HENT:  Head:  Normocephalic.  Neck: Normal range of motion.  Cardiovascular: Normal rate.  Respiratory: Effort normal.  GI: Soft.  Large truncal obesity. Old infraumbilical scar w/o hernias.   Genitourinary: Vagina normal.  Genitourinary Comments: Minimal Rt CVAT.   Neurological: She is alert.  Skin: Skin is warm.  Psychiatric: She has a normal mood and affect.    Assessment/Plan:  1 - Gross Hematuria - likely renal source in setting of acute renal injury + necessary blood thinners. Fortunately she is emptying appropriately and no large / formed / refracotry renal or bladder clot on imaging and residual urine. At this point I do NOT recommend any bladder irrigation as no absolute indications and would make very accurate UOP nearly impossible to determine.   Should she develop frank clot retention, we are happy to re-assess. Symptoms of this were discussed with patient.  We will arrange for outpatient follow up to complete hematuria eval with office cysto.  2 - Acute Renal Failure - agree likely contrast nephropathy. No urinary retention / hydro to suggest obstructive etiology.   Please call me directly with questions anytime.     Traniya Prichett 02/12/2018, 1:04 PM

## 2018-02-12 NOTE — Progress Notes (Addendum)
[facility]  Subjective  - Doing much better with her left arm swelling.   Objective (!) 137/93 80 97.9 F (36.6 C) (Oral) 17 100%  Intake/Output Summary (Last 24 hours) at 02/12/2018 0752 Last data filed at 02/12/2018 0600 Gross per 24 hour  Intake 729.25 ml  Output 2200 ml  Net -1470.75 ml    Left arm soft without apparent edema.   Palpable radial pulses B, DP pulses B 2+ No LE edema. Heart RRR Non labored breathing  Assessment/Planning: POD # 11s/p Angiojet L arm and central DVT complicated by AKI Heparin IV VTE treatment Cr 9.96, K+ stable 4.2 HD course followed by Nephrology.  She has had 2 HD sessions so far.    UO 2,200 last 24 hours.    Heather Hanson 02/12/2018 7:52 AM --  Laboratory Lab Results: Recent Labs    02/11/18 0426 02/12/18 0332  WBC 7.2 8.6  HGB 9.0* 9.3*  HCT 29.1* 29.5*  PLT 112* 120*   BMET Recent Labs    02/11/18 0426 02/12/18 0332  NA 140 142  K 3.7 4.2  CL 104 103  CO2 22 24  GLUCOSE 92 91  BUN 32* 38*  CREATININE 9.77* 9.96*  CALCIUM 8.4* 8.7*    COAG Lab Results  Component Value Date   INR 0.91 01/30/2018   No results found for: PTT

## 2018-02-12 NOTE — Progress Notes (Signed)
PROGRESS NOTE [facility]   Heather Hanson   ZOX:[id number] DOB: 04-22-1961  DOA: 01/30/2018 PCP: Macy Mis, MD   Brief Narrative:  Heather Hanson is a 57 year old female with medical history significant for hyperlipidemia, GERD, hypothyroidism, depression and anxiety who presented to the emergency department complaining of left upper arm pain and swelling.  Upon ED evaluation patient was found to have left upper arm swelling venous Doppler showed deep venous thrombosis involving the brachial veins in the subclavians with a thrombus extending.  Also positive for superficial venous thrombosis involving the left basilic vein.  Vascular surgery was consulted and patient is s/p AngioJet thrombolysis with subsequent development of Acute Kidney injury suspected to be non oliguric ATN. Consulted Urology for current jelly type output from urethra as patient may require further testing and recommendations and possibly bladder irrigation.  Subjective: No acute issues overnight.   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  - once renal condition improves will need to decide on appropriate oral anticoagulant moving forward. - Vascular on board.   Acute kidney injury/ATN from contrast-induced nephropathy - Secondary to AngioJet thrombolysis, venogram on 2/21 and CT angiogram of the chest on admission, kidney function was normal prior to that - Non Oliguric ATN - Nephrology Managing. Suspect that condition will improve. Today recommending consultation to Urology due to currant jelly looking discharge from urethra with concerns that viscosity may cause outflow problems of urine. Urology consulted who will run further tests prior to intervention or recommendations.  Left upper chest/Breast small  hematoma -likely hematoma from recent procedure and anticoagulation  Anemia -stable, continue to monitor  Acute on chronic dysphagia /esophageal stricture -chronic problem, worse with cold liquids  -Barium esophagram concerning for possible stricture, able to tolerate current diet -Would benefit from GI workup pending improvement in kidney function, has been seen and scoped by Dr. Loreta Ave in the past. This can be worked up as an outpatient.  Hx of seizures  Asymptomatic  Continue Topomax   HLD  Continue Zocor   Depression and Anxiety  Continue home medications, doses lowered for GFR -xananx PRN  DVT prophylaxis: Heparin ggt  Code Status: Full Code  Family Communication: None at bedside  Disposition Plan: Home pending improvement in kidney function  Consultants:   Vascular surgery   Nephrology   Procedures:   TPA   Antimicrobials: Anti-infectives (From admission, onward)   Start     Dose/Rate Route Frequency Ordered Stop   02/02/18 1200  fluconazole (DIFLUCAN) tablet 100 mg  Status:  Discontinued     100 mg Oral Daily 02/02/18 1155 02/07/18 0913   01/30/18 1515  clindamycin (CLEOCIN) IVPB 900 mg     900 mg 100 mL/hr over 30 Minutes Intravenous  Once 01/30/18 1510 01/30/18 1608          Objective: Vitals:   02/11/18 2103 02/12/18 0438 02/12/18 0900 02/12/18 1242  BP: (!) 137/93 (!) 137/93  119/82  Pulse: 68 80  90  Resp: 16 17  18   Temp: 98 F (36.7 C) 97.9 F (36.6 C)    TempSrc: Oral Oral  Oral  SpO2: 100% 100%  100%  Weight:   99.8 kg (220 lb)   Height:        Intake/Output Summary (Last 24 hours) at 02/12/2018 1304 Last data  filed at 02/12/2018 1243 Gross per 24 hour  Intake 729.25 ml  Output 1900 ml  Net -1170.75 ml   Filed Weights   02/09/18 0641 02/09/18 2016 02/12/18 0900  Weight: 109.8 kg (242 lb) 105.9 kg (233 lb 7.5 oz) 99.8 kg (220 lb)    Examination: Exam unchanged when compared to 02/11/2018 Gen: Awake, Alert, Oriented X 3, no  distress HEENT: PERRLA, Neck supple, no JVD Lungs: CTAB, equal chest rise. Chest: small quarter sized oval mass in left upper breast/chest slightly tender-unchanged from 2/25 CVS: RRR,No Gallops,Rubs or new Murmurs Abd: soft, Non tender, non distended, BS present Extremities: mild left arm swelling Skin: no new rashes Psychiatry:  Mood & affect appropriate.    Data Reviewed: I have personally reviewed following labs and imaging studies  CBC: Recent Labs  Lab 02/08/18 0333 02/09/18 0442 02/10/18 0533 02/11/18 0426 02/12/18 0332  WBC 6.8 6.7 7.6 7.2 8.6  HGB 8.1* 8.7* 9.9* 9.0* 9.3*  HCT 25.9* 27.5* 30.9* 29.1* 29.5*  MCV 69.1* 69.6* 69.6* 70.1* 69.9*  PLT 169 169 103* 112* 120*   Basic Metabolic Panel: Recent Labs  Lab 02/08/18 0333 02/09/18 0442 02/10/18 0533 02/11/18 0426 02/12/18 0332  NA 140 143 138 140 142  K 4.0 4.1 3.8 3.7 4.2  CL 108 109 102 104 103  CO2 21* 21* 22 22 24   GLUCOSE 76 75 83 92 91  BUN 29* 33* 24* 32* 38*  CREATININE 9.67* 11.03* 8.60* 9.77* 9.96*  CALCIUM 8.0* 8.3* 8.2* 8.4* 8.7*  PHOS 4.7* 5.6* 4.8* 5.3* 6.7*   GFR: Estimated Creatinine Clearance: 7.7 mL/min (A) (by C-G formula based on SCr of 9.96 mg/dL (H)). Liver Function Tests: Recent Labs  Lab 02/07/18 0048 02/08/18 0333 02/09/18 0442 02/10/18 0533 02/11/18 0426 02/12/18 0332  ALT 47  --   --   --   --   --   ALBUMIN  --  3.0* 3.2* 3.4* 3.4* 3.6   No results for input(s): LIPASE, AMYLASE in the last 168 hours. No results for input(s): AMMONIA in the last 168 hours. Coagulation Profile: No results for input(s): INR, PROTIME in the last 168 hours. Cardiac Enzymes: No results for input(s): CKTOTAL, CKMB, CKMBINDEX, TROPONINI in the last 168 hours. BNP (last 3 results) No results for input(s): PROBNP in the last 8760 hours. HbA1C: No results for input(s): HGBA1C in the last 72 hours. CBG: Recent Labs  Lab 02/08/18 0555 02/09/18 0612 02/10/18 0633 02/11/18 0600  02/12/18 0626  GLUCAP 89 77 88 92 87   Lipid Profile: No results for input(s): CHOL, HDL, LDLCALC, TRIG, CHOLHDL, LDLDIRECT in the last 72 hours. Thyroid Function Tests: No results for input(s): TSH, T4TOTAL, FREET4, T3FREE, THYROIDAB in the last 72 hours. Anemia Panel: No results for input(s): VITAMINB12, FOLATE, FERRITIN, TIBC, IRON, RETICCTPCT in the last 72 hours. Sepsis Labs: No results for input(s): PROCALCITON, LATICACIDVEN in the last 168 hours.  No results found for this or any previous visit (from the past 240 hour(s)).    Radiology Studies: No results found.  Scheduled Meds: . amLODipine  5 mg Oral Daily  . ARIPiprazole  2 mg Oral Daily  . ezetimibe  10 mg Oral Daily  . famotidine  20 mg Oral Daily  . gabapentin  300 mg Oral QHS  . heparin  40 Units/kg Dialysis Once in dialysis  . levothyroxine  25 mcg Oral QAC breakfast  . polyethylene glycol  17 g Oral Daily  . senna-docusate  1 tablet Oral BID  .  sodium chloride flush  3 mL Intravenous Q12H  . topiramate  100 mg Oral BID  . venlafaxine XR  75 mg Oral Q breakfast   Continuous Infusions: . sodium chloride    . sodium chloride    . sodium chloride    . sodium chloride    . sodium chloride    . heparin 1,000 Units/hr (02/12/18 0600)     LOS: 12 days    Time spent: 15 min  Penny Piarlando Domnique Vanegas, MD Pager: Text Page via [email or web address]   If 7PM-7AM, please contact night-coverage [email or web address] 02/12/2018, 1:04 PM

## 2018-02-12 NOTE — Progress Notes (Addendum)
[facility] Rounding Note  Subjective/Interval History:  Urine is like currant jelly Passing clots  VERY upset, crying "nothing is being done for me"  HD 2/27, 3/2; has R temp cath) Creatinine may be reaching a plateau   Objective Vital signs in last 24 hours: Vitals:   02/11/18 0601 02/11/18 1210 02/11/18 2103 02/12/18 0438  BP: (!) 145/78 (!) 148/78 (!) 137/93 (!) 137/93  Pulse: 69 95 68 80  Resp: 15 18 16 17   Temp: 98.4 F (36.9 C) 98.3 F (36.8 C) 98 F (36.7 C) 97.9 F (36.6 C)  TempSrc: Oral Oral Oral Oral  SpO2: 97% 98% 100% 100%  Weight:      Height:       Weight change:   Intake/Output Summary (Last 24 hours) at 02/12/2018 1200 Last data filed at 02/12/2018 0900 Gross per 24 hour  Intake 729.25 ml  Output 2200 ml  Net -1470.75 ml   Physical Exam:  Blood pressure (!) 137/93, pulse 80, temperature 97.9 F (36.6 C), temperature source Oral, resp. rate 17, height 5\' 9"  (1.753 m), weight 105.9 kg (233 lb 7.5 oz), SpO2 100 %.  Extremely emotional, crying R IJ temp cath  L arm some edema (by report much less) Lungs clear S1S2 No S3 Abd soft No LE edema  Recent Labs  Lab 02/06/18 0246 02/07/18 0046 02/08/18 0333 02/09/18 0442 02/10/18 0533 02/11/18 0426 02/12/18 0332  NA 139 139 140 143 138 140 142  K 4.4 4.3 4.0 4.1 3.8 3.7 4.2  CL 111 112* 108 109 102 104 103  CO2 17* 16* 21* 21* 22 22 24   GLUCOSE 83 85 76 75 83 92 91  BUN 35* 40* 29* 33* 24* 32* 38*  CREATININE 11.71* 12.26* 9.67* 11.03* 8.60* 9.77* 9.96*  CALCIUM 8.2* 7.6* 8.0* 8.3* 8.2* 8.4* 8.7*  PHOS 5.8* 6.2* 4.7* 5.6* 4.8* 5.3* 6.7*    Recent Labs  Lab 02/07/18 0048  02/10/18 0533 02/11/18 0426 02/12/18 0332  ALT 47  --   --   --   --   ALBUMIN  --    < > 3.4* 3.4* 3.6   < > = values in this interval not displayed.    Recent Labs  Lab 02/09/18 0442 02/10/18 0533 02/11/18 0426 02/12/18 0332  WBC 6.7 7.6 7.2 8.6  HGB 8.7* 9.9* 9.0* 9.3*  HCT 27.5* 30.9* 29.1* 29.5*  MCV 69.6* 69.6*  70.1* 69.9*  PLT 169 103* 112* 120*    Recent Labs  Lab 02/08/18 0555 02/09/18 0612 02/10/18 0633 02/11/18 0600 02/12/18 0626  GLUCAP 89 77 88 92 87   Medications: . sodium chloride    . sodium chloride    . sodium chloride    . sodium chloride    . sodium chloride    . heparin 1,000 Units/hr (02/12/18 0600)   . amLODipine  5 mg Oral Daily  . ARIPiprazole  2 mg Oral Daily  . ezetimibe  10 mg Oral Daily  . famotidine  20 mg Oral Daily  . gabapentin  300 mg Oral QHS  . heparin  40 Units/kg Dialysis Once in dialysis  . levothyroxine  25 mcg Oral QAC breakfast  . polyethylene glycol  17 g Oral Daily  . senna-docusate  1 tablet Oral BID  . sodium chloride flush  3 mL Intravenous Q12H  . topiramate  100 mg Oral BID  . venlafaxine XR  75 mg Oral Q breakfast    Assessment/Recommendations   1. AKI - post  angioject procedure/venogram/CT angio for LUE DVT. Contrast + pigment nephropathy. Has required 2 HD TMTs, last one was 3/2, good UOP, creatinine may be reaching plateau. Electrolytes still fine. No indication for HD today. Reassess for need tomorrow. (These pts usually recover, but time course for prior cases variable) 2. LUE and central DVT - brachial/subclavian/innominate. Vascular following. Remains on heparin (not sure of need for other procedures yet so not transitioning to coumadin yet) 3. Gross hematuria with clots - still on heparin. Unknown if this is bladder or upper tract. Having some clot retention. Urine in bedside commode looks like currant jelly. I think needs for Urology to see esp since  4. Anemia - Hb stable to sl better 5. Sz disorder - Topomax 6. Dysphagia - poss esoph stricture Ba esophagram 2/22. Outpt eval planned  Heather Balynthia Jadden Yim, MD [facility] [phone] pager 02/12/2018, 12:00 PM

## 2018-02-13 DIAGNOSIS — D696 Thrombocytopenia, unspecified: Secondary | ICD-10-CM

## 2018-02-13 LAB — CBC
HEMATOCRIT: 26.2 % — AB (ref 36.0–46.0)
Hemoglobin: 8.1 g/dL — ABNORMAL LOW (ref 12.0–15.0)
MCH: 21.6 pg — ABNORMAL LOW (ref 26.0–34.0)
MCHC: 30.9 g/dL (ref 30.0–36.0)
MCV: 69.9 fL — ABNORMAL LOW (ref 78.0–100.0)
PLATELETS: 123 10*3/uL — AB (ref 150–400)
RBC: 3.75 MIL/uL — ABNORMAL LOW (ref 3.87–5.11)
RDW: 17.1 % — AB (ref 11.5–15.5)
WBC: 7.8 10*3/uL (ref 4.0–10.5)

## 2018-02-13 LAB — RENAL FUNCTION PANEL
ALBUMIN: 3.3 g/dL — AB (ref 3.5–5.0)
Anion gap: 15 (ref 5–15)
BUN: 40 mg/dL — AB (ref 6–20)
CALCIUM: 8.2 mg/dL — AB (ref 8.9–10.3)
CO2: 21 mmol/L — ABNORMAL LOW (ref 22–32)
Chloride: 102 mmol/L (ref 101–111)
Creatinine, Ser: 9.2 mg/dL — ABNORMAL HIGH (ref 0.44–1.00)
GFR calc Af Amer: 5 mL/min — ABNORMAL LOW (ref >60)
GFR, EST NON AFRICAN AMERICAN: 4 mL/min — AB (ref >60)
GLUCOSE: 109 mg/dL — AB (ref 65–99)
PHOSPHORUS: 6.2 mg/dL — AB (ref 2.5–4.6)
POTASSIUM: 3.9 mmol/L (ref 3.5–5.1)
SODIUM: 138 mmol/L (ref 135–145)

## 2018-02-13 LAB — HEPARIN LEVEL (UNFRACTIONATED): HEPARIN UNFRACTIONATED: 0.56 [IU]/mL (ref 0.30–0.70)

## 2018-02-13 LAB — GLUCOSE, CAPILLARY: Glucose-Capillary: 84 mg/dL (ref 65–99)

## 2018-02-13 MED ORDER — LIDOCAINE HCL 2 % EX GEL
1.0000 "application " | Freq: Once | CUTANEOUS | Status: DC
  Filled 2018-02-13: qty 5

## 2018-02-13 NOTE — Progress Notes (Signed)
Called into patients room patient complaining pain on attempted urination bladder scanned patient for 400 cc notified MD. Orders received to place large lumen foley catheter 20 french if possible.

## 2018-02-13 NOTE — Progress Notes (Signed)
    Subjective  -   Painful urination last night due to clots.  Says warm cloth helps Feel she is owed some money for all of this   Physical Exam:  Edema in left hand is much better.  Palpable radial pulse Left breast hematoma is softer   Assessment/Plan:    Renal:  Hopefully creatinine has peaked.  Has had 2 runs of HD Hematuria:  Urology consulted.  No role for CBI currently continue IV heparin May benefit from hematology consult to assist with duration of anticoagulation given unprovoked DVT.  Testing is limited while on heparin, however it would be good to establish care   Wells Heather Hanson 02/13/2018 4:11 PM --  Vitals:   02/13/18 0944 02/13/18 1311  BP: 126/83 112/70  Pulse:    Resp:  20  Temp:  (!) 97.4 F (36.3 C)  SpO2:  100%    Intake/Output Summary (Last 24 hours) at 02/13/2018 1611 Last data filed at 02/13/2018 1423 Gross per 24 hour  Intake 390 ml  Output 2943 ml  Net -2553 ml     Laboratory CBC    Component Value Date/Time   WBC 7.8 02/13/2018 0330   HGB 8.1 (L) 02/13/2018 0330   HGB 11.7 07/06/2014 1504   HGB 11.7 07/01/2009 0910   HCT 26.2 (L) 02/13/2018 0330   HCT 36.8 07/06/2014 1504   HCT 36.4 07/01/2009 0910   PLT 123 (L) 02/13/2018 0330   PLT 204 07/06/2014 1504   PLT 190 07/01/2009 0910    BMET    Component Value Date/Time   NA 138 02/13/2018 0330   NA 143 06/09/2013 1118   K 3.9 02/13/2018 0330   K 3.7 06/09/2013 1118   CL 102 02/13/2018 0330   CL 108 06/09/2013 1118   CO2 21 (L) 02/13/2018 0330   CO2 27 06/09/2013 1118   GLUCOSE 109 (H) 02/13/2018 0330   GLUCOSE 97 06/09/2013 1118   BUN 40 (H) 02/13/2018 0330   BUN 12 06/09/2013 1118   CREATININE 9.20 (H) 02/13/2018 0330   CREATININE 1.09 03/09/2014 0745   CALCIUM 8.2 (L) 02/13/2018 0330   CALCIUM 9.6 06/09/2013 1118   GFRNONAA 4 (L) 02/13/2018 0330   GFRAA 5 (L) 02/13/2018 0330    COAG Lab Results  Component Value Date   INR 0.91 01/30/2018   No results found for:  PTT  Antibiotics Anti-infectives (From admission, onward)   Start     Dose/Rate Route Frequency Ordered Stop   02/02/18 1200  fluconazole (DIFLUCAN) tablet 100 mg  Status:  Discontinued     100 mg Oral Daily 02/02/18 1155 02/07/18 0913   01/30/18 1515  clindamycin (CLEOCIN) IVPB 900 mg     900 mg 100 mL/hr over 30 Minutes Intravenous  Once 01/30/18 1510 01/30/18 1608       V. Charlena CrossWells Kalaya Infantino IV, M.D. [facility] Office: [phone] Pager:  [phone]

## 2018-02-13 NOTE — Progress Notes (Signed)
PROGRESS NOTE    Heather Hanson   ZOX:[id number]  DOB: 02-15-1961  DOA: 01/30/2018 PCP: Macy Mis, MD   Brief Narrative:  Heather Hanson  is a 57 year old female with medical history significant for hyperlipidemia, GERD, hypothyroidism, depression and anxiety who presented to the emergency department complaining of left upper arm pain and swelling.   ED evaluation revealed left upper arm swelling and venous Doppler showed deep venous thrombosis involving the brachial veins in the subclavians with a thrombus extending.  Also had superficial venous thrombosis involving the left basilic vein.   Vascular surgery was consulted and recommended admission for possible TPA.   Subjective: No complaint other than severe pain with urination today. Still passing blood.  ROS: no complaints of nausea, vomiting, constipation diarrhea, cough, dyspnea. No other complaints.   Assessment & Plan:   Principal Problem:   Acute deep vein thrombosis (DVT) of left upper extremity  - cause uncertain- s/p TPA - will need outpt hypercoagulable work up and hematology follow up  Active Problems: AKI -  Contrast induced nephropathy - appreciate management per nephrology- s/p dialysis x 2 - follow Cr - no dialysis needs today  Hematuria with passage of blood clots - unfortunately, unable to stop Heparin infusion - urology feels that as long as she is voiding, she does not need any further intervention   - has no urinary retention but voiding is painful - needs outpt follow up if hematuria continues  Left breast Hematoma - due to TPA- stable   Acute on chronic dysphagia - cont to follow - needs outpt eval  Anemia of chronic disease - follow hemoglobin daily -  drop in Hb from yesterday may be due to hematuria  Acute thrombocytopenia - follow daily while on Heparin infusion- may be consumption due to acute clot and then due to hematuria  DVT prophylaxis: Heparin infusion Code Status: full  code Family Communication:  Disposition Plan: home when stable Consultants:   Vascular surgery  nephrology Procedures:   TPA  Dialysis cath Antimicrobials:  Anti-infectives (From admission, onward)   Start     Dose/Rate Route Frequency Ordered Stop   02/02/18 1200  fluconazole (DIFLUCAN) tablet 100 mg  Status:  Discontinued     100 mg Oral Daily 02/02/18 1155 02/07/18 0913   01/30/18 1515  clindamycin (CLEOCIN) IVPB 900 mg     900 mg 100 mL/hr over 30 Minutes Intravenous  Once 01/30/18 1510 01/30/18 1608       Objective: Vitals:   02/12/18 2156 02/13/18 0518 02/13/18 0944 02/13/18 1311  BP: (!) 142/74 131/85 126/83 112/70  Pulse: 69 74    Resp: 18 18  20   Temp: 97.8 F (36.6 C) 97.8 F (36.6 C)  (!) 97.4 F (36.3 C)  TempSrc: Oral Oral  Oral  SpO2: 99% 100%  100%  Weight:  104.1 kg (229 lb 9.6 oz)    Height:        Intake/Output Summary (Last 24 hours) at 02/13/2018 1712 Last data filed at 02/13/2018 1423 Gross per 24 hour  Intake 390 ml  Output 2943 ml  Net -2553 ml   Filed Weights   02/09/18 2016 02/12/18 0900 02/13/18 0518  Weight: 105.9 kg (233 lb 7.5 oz) 99.8 kg (220 lb) 104.1 kg (229 lb 9.6 oz)    Examination: General exam: Appears comfortable  HEENT: PERRLA, oral mucosa moist, no sclera icterus or thrush Respiratory system: Clear to auscultation. Respiratory effort normal. Cardiovascular system: S1 & S2 heard,  RRR.  No murmurs  Gastrointestinal system: Abdomen soft, non-tender, nondistended. Normal bowel sound. No organomegaly Central nervous system: Alert and oriented. No focal neurological deficits. Extremities: No cyanosis, clubbing or edema Skin: No rashes or ulcers Psychiatry:  Mood & affect appropriate.     Data Reviewed: I have personally reviewed following labs and imaging studies  CBC: Recent Labs  Lab 02/09/18 0442 02/10/18 0533 02/11/18 0426 02/12/18 0332 02/13/18 0330  WBC 6.7 7.6 7.2 8.6 7.8  HGB 8.7* 9.9* 9.0* 9.3* 8.1*    HCT 27.5* 30.9* 29.1* 29.5* 26.2*  MCV 69.6* 69.6* 70.1* 69.9* 69.9*  PLT 169 103* 112* 120* 123*   Basic Metabolic Panel: Recent Labs  Lab 02/09/18 0442 02/10/18 0533 02/11/18 0426 02/12/18 0332 02/13/18 0330  NA 143 138 140 142 138  K 4.1 3.8 3.7 4.2 3.9  CL 109 102 104 103 102  CO2 21* 22 22 24  21*  GLUCOSE 75 83 92 91 109*  BUN 33* 24* 32* 38* 40*  CREATININE 11.03* 8.60* 9.77* 9.96* 9.20*  CALCIUM 8.3* 8.2* 8.4* 8.7* 8.2*  PHOS 5.6* 4.8* 5.3* 6.7* 6.2*   GFR: Estimated Creatinine Clearance: 8.6 mL/min (A) (by C-G formula based on SCr of 9.2 mg/dL (H)). Liver Function Tests: Recent Labs  Lab 02/07/18 0048  02/09/18 0442 02/10/18 0533 02/11/18 0426 02/12/18 0332 02/13/18 0330  ALT 47  --   --   --   --   --   --   ALBUMIN  --    < > 3.2* 3.4* 3.4* 3.6 3.3*   < > = values in this interval not displayed.   No results for input(s): LIPASE, AMYLASE in the last 168 hours. No results for input(s): AMMONIA in the last 168 hours. Coagulation Profile: No results for input(s): INR, PROTIME in the last 168 hours. Cardiac Enzymes: No results for input(s): CKTOTAL, CKMB, CKMBINDEX, TROPONINI in the last 168 hours. BNP (last 3 results) No results for input(s): PROBNP in the last 8760 hours. HbA1C: No results for input(s): HGBA1C in the last 72 hours. CBG: Recent Labs  Lab 02/09/18 0612 02/10/18 0633 02/11/18 0600 02/12/18 0626 02/13/18 0616  GLUCAP 77 88 92 87 84   Lipid Profile: No results for input(s): CHOL, HDL, LDLCALC, TRIG, CHOLHDL, LDLDIRECT in the last 72 hours. Thyroid Function Tests: No results for input(s): TSH, T4TOTAL, FREET4, T3FREE, THYROIDAB in the last 72 hours. Anemia Panel: No results for input(s): VITAMINB12, FOLATE, FERRITIN, TIBC, IRON, RETICCTPCT in the last 72 hours. Urine analysis:    Component Value Date/Time   COLORURINE RED (A) 02/08/2018 1144   APPEARANCEUR CLOUDY (A) 02/08/2018 1144   LABSPEC  02/08/2018 1144    TEST NOT  REPORTED DUE TO COLOR INTERFERENCE OF URINE PIGMENT   LABSPEC 1.020 06/05/2006 0845   PHURINE  02/08/2018 1144    TEST NOT REPORTED DUE TO COLOR INTERFERENCE OF URINE PIGMENT   GLUCOSEU (A) 02/08/2018 1144    TEST NOT REPORTED DUE TO COLOR INTERFERENCE OF URINE PIGMENT   HGBUR (A) 02/08/2018 1144    TEST NOT REPORTED DUE TO COLOR INTERFERENCE OF URINE PIGMENT   BILIRUBINUR (A) 02/08/2018 1144    TEST NOT REPORTED DUE TO COLOR INTERFERENCE OF URINE PIGMENT   BILIRUBINUR Negative 06/05/2006 0845   KETONESUR (A) 02/08/2018 1144    TEST NOT REPORTED DUE TO COLOR INTERFERENCE OF URINE PIGMENT   PROTEINUR (A) 02/08/2018 1144    TEST NOT REPORTED DUE TO COLOR INTERFERENCE OF URINE PIGMENT   UROBILINOGEN  0.2 09/30/2013 2344   NITRITE (A) 02/08/2018 1144    TEST NOT REPORTED DUE TO COLOR INTERFERENCE OF URINE PIGMENT   LEUKOCYTESUR (A) 02/08/2018 1144    TEST NOT REPORTED DUE TO COLOR INTERFERENCE OF URINE PIGMENT   LEUKOCYTESUR Negative 06/05/2006 0845   Sepsis Labs: @LABRCNTIP (procalcitonin:4,lacticidven:4) )No results found for this or any previous visit (from the past 240 hour(s)).       Radiology Studies: Ct Renal Stone Study  Result Date: 02/12/2018 CLINICAL DATA:  Hematuria and right-sided abdominal pain x2 weeks. Rule out urolithiasis. EXAM: CT ABDOMEN AND PELVIS WITHOUT CONTRAST TECHNIQUE: Multidetector CT imaging of the abdomen and pelvis was performed following the standard protocol without IV contrast. COMPARISON:  07/16/2013 CT, 02/02/2018 renal ultrasound FINDINGS: Lower chest: Normal heart size without pericardial effusion. Hepatobiliary: Small subcentimeter hypodensities in the left hepatic lobe statistically consistent with cysts or hemangiomata, relatively stable since 2014 and consistent with benign findings. Status post cholecystectomy. No biliary dilatation. Pancreas: No ductal dilatation or inflammation. No mass is identified given limitations of a noncontrast study.  Spleen: Small adjacent splenule.  Normal size spleen. Adrenals/Urinary Tract: Normal bilateral adrenal glands. Punctate nonobstructing interpolar right renal calculus best seen on coronal reformatted image 57. No hydroureteronephrosis. Stable right-sided phleboliths are noted within the pelvis relative to previous CT from 2014. No bladder mass or calculus is apparent on this unenhanced study. Slightly thick-walled appearance of the bladder is likely due to underdistention. The possibility of a cystitis is not entirely excluded a can be correlated. Stomach/Bowel: Stomach is within normal limits. Appendix appears normal. A large amount of fecal retention is seen throughout the colon. No evidence of bowel wall thickening, distention, or inflammatory changes. Vascular/Lymphatic: Aortic atherosclerosis. No enlarged abdominal or pelvic lymph nodes. Reproductive: Status post hysterectomy. No adnexal masses. Other: No free air.  No abdominopelvic ascites. Musculoskeletal: Marked joint space narrowing of both hips. No acute nor suspicious osseous abnormalities. IMPRESSION: 1. Punctate right-sided nonobstructing interpolar renal calculus. No hydroureteronephrosis. 2. Slightly thick-walled appearance of the urinary bladder is likely due to underdistention. Cystitis is not excluded but believed less likely. 3. Small subcentimeter hypodensities in the left hepatic lobe dating back to 2014 consistent with benign findings, statistically more likely to represent hepatic cysts or hemangiomata. 4. Increased fecal residue throughout the colon. Query constipation. Electronically Signed   By: Tollie Ethavid  Kwon M.D.   On: 02/12/2018 17:14      Scheduled Meds: . amLODipine  5 mg Oral Daily  . ARIPiprazole  2 mg Oral Daily  . ezetimibe  10 mg Oral Daily  . famotidine  20 mg Oral Daily  . gabapentin  300 mg Oral QHS  . heparin  40 Units/kg Dialysis Once in dialysis  . levothyroxine  25 mcg Oral QAC breakfast  . lidocaine  1  application Urethral Once  . polyethylene glycol  17 g Oral Daily  . senna-docusate  1 tablet Oral BID  . sodium chloride flush  3 mL Intravenous Q12H  . topiramate  100 mg Oral BID  . venlafaxine XR  75 mg Oral Q breakfast   Continuous Infusions: . sodium chloride    . sodium chloride    . sodium chloride    . sodium chloride    . sodium chloride    . heparin 1,000 Units/hr (02/13/18 1043)     LOS: 13 days    Time spent in minutes: 40    Calvert CantorSaima Alysson Geist, MD [facility] Pager: [email or web address] Password [email or web address] 02/13/2018, 5:12 PM

## 2018-02-13 NOTE — Progress Notes (Signed)
Patient was able to void 400 ml urine with clots will delay placing urinary catheter for now.

## 2018-02-13 NOTE — Progress Notes (Addendum)
[facility] Rounding Note  Subjective/Interval History:  Seen by Urology Since able to void out the clots no irrigation for now  [facility] "[facility] one out like [facility]"with her last void Urine still like "jam" Better spirits today despite the urine issues  HD 2/27, 3/2; has R temp cath) Creatinine is starting to come down today for the first time  Objective Vital signs in last 24 hours: Vitals:   02/12/18 1242 02/12/18 2156 02/13/18 0518 02/13/18 0944  BP: 119/82 (!) 142/74 131/85 126/83  Pulse: 90 69 74   Resp: 18 18 18    Temp:  97.8 F (36.6 C) 97.8 F (36.6 C)   TempSrc: Oral Oral Oral   SpO2: 100% 99% 100%   Weight:   104.1 kg (229 lb 9.6 oz)   Height:       Weight change:   Intake/Output Summary (Last 24 hours) at 02/13/2018 1248 Last data filed at 02/13/2018 1134 Gross per 24 hour  Intake 470 ml  Output 3579 ml  Net -3109 ml   Physical Exam:  Blood pressure 126/83, pulse 74, temperature 97.8 F (36.6 C), temperature source Oral, resp. rate 18, height 5\' 9"  (1.753 m), weight 104.1 kg (229 lb 9.6 oz), SpO2 100 %.  R IJ temp cath site OK L arm min edema Lungs clear S1S2 No S3 Abd soft No LE edema  Recent Labs  Lab 02/07/18 0046 02/08/18 0333 02/09/18 0442 02/10/18 0533 02/11/18 0426 02/12/18 0332 02/13/18 0330  NA 139 140 143 138 140 142 138  K 4.3 4.0 4.1 3.8 3.7 4.2 3.9  CL 112* 108 109 102 104 103 102  CO2 16* 21* 21* 22 22 24  21*  GLUCOSE 85 76 75 83 92 91 109*  BUN 40* 29* 33* 24* 32* 38* 40*  CREATININE 12.26* 9.67* 11.03* 8.60* 9.77* 9.96* 9.20*  CALCIUM 7.6* 8.0* 8.3* 8.2* 8.4* 8.7* 8.2*  PHOS 6.2* 4.7* 5.6* 4.8* 5.3* 6.7* 6.2*    Recent Labs  Lab 02/07/18 0048  02/11/18 0426 02/12/18 0332 02/13/18 0330  ALT 47  --   --   --   --   ALBUMIN  --    < > 3.4* 3.6 3.3*   < > = values in this interval not displayed.    Recent Labs  Lab 02/10/18 0533 02/11/18 0426 02/12/18 0332 02/13/18 0330  WBC 7.6 7.2 8.6 7.8  HGB 9.9* 9.0* 9.3* 8.1*   HCT 30.9* 29.1* 29.5* 26.2*  MCV 69.6* 70.1* 69.9* 69.9*  PLT 103* 112* 120* 123*    Recent Labs  Lab 02/09/18 0612 02/10/18 0633 02/11/18 0600 02/12/18 0626 02/13/18 0616  GLUCAP 77 88 92 87 84   Medications: . sodium chloride    . sodium chloride    . sodium chloride    . sodium chloride    . sodium chloride    . heparin 1,000 Units/hr (02/13/18 1043)   . amLODipine  5 mg Oral Daily  . ARIPiprazole  2 mg Oral Daily  . ezetimibe  10 mg Oral Daily  . famotidine  20 mg Oral Daily  . gabapentin  300 mg Oral QHS  . heparin  40 Units/kg Dialysis Once in dialysis  . levothyroxine  25 mcg Oral QAC breakfast  . lidocaine  1 application Urethral Once  . polyethylene glycol  17 g Oral Daily  . senna-docusate  1 tablet Oral BID  . sodium chloride flush  3 mL Intravenous Q12H  . topiramate  100  mg Oral BID  . venlafaxine XR  75 mg Oral Q breakfast    Assessment/Recommendations   1. AKI - post angioject procedure/venogram/CT angio for LUE DVT. Contrast + pigment nephropathy. Has required 2 HD TMTs, last one was 3/2, good UOP, creatinine down today for the first time. No indication for HD. Trend labs and exam. (These pts recover, but time course for prior cases variable) 2. Gross hematuria with clots - Dr. Berneice Heinrich has seen. No bladder irrigation unless frank clot retention. Will need outpt cysto per his note.  3. LUE and central DVT - brachial/subclavian/innominate. Vascular following. Remains on heparin  4. Anemia - Hb dropping with sig gross hematuria. Transfuse prn. Check Fe studies. 5. Sz disorder - Topomax 6. Dysphagia - poss esoph stricture Ba esophagram 2/22. Outpt eval planned  Camille Bal, MD [facility] [phone] pager 02/13/2018, 12:48 PM

## 2018-02-13 NOTE — Progress Notes (Signed)
ANTICOAGULATION CONSULT NOTE - Follow Up Consult  Pharmacy Consult for Heparin Indication: DVT  Allergies  Allergen Reactions  . Aspirin Other (See Comments)    Per Dr  . Augmentin [Amoxicillin-Pot Clavulanate] Other (See Comments)    Blisters in the back of throat  . Dexilant [Dexlansoprazole] Other (See Comments)    Bowel urgency and liquid stools  . Hydrocodone-Acetaminophen Hives    REACTION: Rash  . Influenza Vaccines Swelling    Arm swelled to the size of a grapefruit  . Levsin [Hyoscyamine Sulfate] Other (See Comments)    Projectile vomitting  . Linzess [Linaclotide] Other (See Comments)    Bowel urgency and liquid stools  . Promethazine Nausea And Vomiting  . Propoxyphene N-Acetaminophen Itching    REACTION: rash  . Statins Other (See Comments)    myalgia  . Acetaminophen Itching and Rash  . Latex Itching and Rash    Rash-looks like she has the measles per pt  . Oxycodone-Acetaminophen Itching and Rash    REACTION: Rash    Patient Measurements: Height: 5\' 9"  (175.3 cm) Weight: 229 lb 9.6 oz (104.1 kg) IBW/kg (Calculated) : 66.2 Heparin Dosing Weight:  89.5 kg  Vital Signs: Temp: 97.8 F (36.6 C) (03/06 0518) Temp Source: Oral (03/06 0518) BP: 131/85 (03/06 0518) Pulse Rate: 74 (03/06 0518)  Labs: Recent Labs    02/11/18 0426 02/12/18 0332 02/12/18 1306 02/13/18 0330  HGB 9.0* 9.3*  --  8.1*  HCT 29.1* 29.5*  --  26.2*  PLT 112* 120*  --  123*  HEPARINUNFRC 0.62 0.76* 0.60 0.56  CREATININE 9.77* 9.96*  --  9.20*    Estimated Creatinine Clearance: 8.6 mL/min (A) (by C-G formula based on SCr of 9.2 mg/dL (H)).  Assessment: 56 yof continuing on heparin for extensive DVT of unknown etiology, s/p lysis 2/21. C/o another bleed in in L thigh and new mass on the back of L leg (some bruising and a small hematoma on the back of her left leg) another small palpable mass on the left posterior thigh as well.   Heparin level this morning is therapeutic at  0.56, on 1000 units/hr. No issues with bleeding documented outside hematuria (noted 2/25 and ongoing per MD notes and talking to nursing) - urine still with clots. Hgb has been low (at 8.1 today), plt now up to 123 - slowly trending upward.  Goal of Therapy:  Heparin level 0.3-0.7 units/ml Monitor platelets by anticoagulation protocol: Yes   Plan:  - Continue heparin to 1000 units/hr - Heparin level daily with CBC daily - Monitor for s/sx bleeding - F/u plans for PO anticoagulation as appropriate  Girard CooterKimberly Perkins, PharmD Clinical Pharmacist  Pager: [phone] Clinical Phone for 02/13/2018 until 3:30pm: [phone] If after 3:30pm, please call main pharmacy at [phone] 02/13/2018 8:54 AM

## 2018-02-14 DIAGNOSIS — I8290 Acute embolism and thrombosis of unspecified vein: Secondary | ICD-10-CM

## 2018-02-14 DIAGNOSIS — D62 Acute posthemorrhagic anemia: Secondary | ICD-10-CM

## 2018-02-14 DIAGNOSIS — N186 End stage renal disease: Secondary | ICD-10-CM

## 2018-02-14 DIAGNOSIS — R310 Gross hematuria: Secondary | ICD-10-CM

## 2018-02-14 LAB — RENAL FUNCTION PANEL
ANION GAP: 13 (ref 5–15)
Albumin: 3.3 g/dL — ABNORMAL LOW (ref 3.5–5.0)
BUN: 39 mg/dL — ABNORMAL HIGH (ref 6–20)
CO2: 23 mmol/L (ref 22–32)
CREATININE: 8.47 mg/dL — AB (ref 0.44–1.00)
Calcium: 8.4 mg/dL — ABNORMAL LOW (ref 8.9–10.3)
Chloride: 103 mmol/L (ref 101–111)
GFR, EST AFRICAN AMERICAN: 5 mL/min — AB (ref >60)
GFR, EST NON AFRICAN AMERICAN: 5 mL/min — AB (ref >60)
Glucose, Bld: 95 mg/dL (ref 65–99)
POTASSIUM: 4.6 mmol/L (ref 3.5–5.1)
Phosphorus: 6.5 mg/dL — ABNORMAL HIGH (ref 2.5–4.6)
Sodium: 139 mmol/L (ref 135–145)

## 2018-02-14 LAB — HEPARIN LEVEL (UNFRACTIONATED): HEPARIN UNFRACTIONATED: 0.57 [IU]/mL (ref 0.30–0.70)

## 2018-02-14 LAB — URINALYSIS, ROUTINE W REFLEX MICROSCOPIC
Leukocytes, UA: NEGATIVE
Nitrite: NEGATIVE

## 2018-02-14 LAB — CBC
HEMATOCRIT: 25.2 % — AB (ref 36.0–46.0)
HEMOGLOBIN: 7.7 g/dL — AB (ref 12.0–15.0)
MCH: 21.6 pg — AB (ref 26.0–34.0)
MCHC: 30.6 g/dL (ref 30.0–36.0)
MCV: 70.6 fL — AB (ref 78.0–100.0)
PLATELETS: 154 10*3/uL (ref 150–400)
RBC: 3.57 MIL/uL — AB (ref 3.87–5.11)
RDW: 16.9 % — ABNORMAL HIGH (ref 11.5–15.5)
WBC: 8.1 10*3/uL (ref 4.0–10.5)

## 2018-02-14 LAB — URINALYSIS, MICROSCOPIC (REFLEX)

## 2018-02-14 LAB — IRON AND TIBC
Iron: 69 ug/dL (ref 28–170)
SATURATION RATIOS: 30 % (ref 10.4–31.8)
TIBC: 232 ug/dL — ABNORMAL LOW (ref 250–450)
UIBC: 163 ug/dL

## 2018-02-14 LAB — ABO/RH: ABO/RH(D): AB POS

## 2018-02-14 LAB — HEMOGLOBIN AND HEMATOCRIT, BLOOD
HEMATOCRIT: 29.5 % — AB (ref 36.0–46.0)
HEMOGLOBIN: 9.2 g/dL — AB (ref 12.0–15.0)

## 2018-02-14 LAB — PREPARE RBC (CROSSMATCH)

## 2018-02-14 LAB — GLUCOSE, CAPILLARY: GLUCOSE-CAPILLARY: 72 mg/dL (ref 65–99)

## 2018-02-14 MED ORDER — SODIUM CHLORIDE 0.9 % IV SOLN: Freq: Once | INTRAVENOUS | Status: DC

## 2018-02-14 NOTE — Progress Notes (Addendum)
[facility] Rounding Note  Subjective/Interval History:  Seen by Urology Since able to void out the clots no irrigation for now Says she has had to "push urine out like a baby" Urine still like "jam" Spirits wax and wane Says getting blood today  HD 2/27, 3/2; still has R temp cath Creatinine clearly starting to fall now and urine volumes good  Objective Vital signs in last 24 hours: Vitals:   02/13/18 0944 02/13/18 1311 02/13/18 2034 02/14/18 0623  BP: 126/83 112/70 (!) 148/80 136/75  Pulse:   72 76  Resp:  20 20 20   Temp:  (!) 97.4 F (36.3 C) 98 F (36.7 C) 98.1 F (36.7 C)  TempSrc:  Oral Oral Oral  SpO2:  100% 100% 100%  Weight:      Height:       Weight change:   Intake/Output Summary (Last 24 hours) at 02/14/2018 1112 Last data filed at 02/14/2018 0641 Gross per 24 hour  Intake 240 ml  Output 2750 ml  Net -2510 ml   Physical Exam:  Blood pressure 136/75, pulse 76, temperature 98.1 F (36.7 C), temperature source Oral, resp. rate 20, height 5\' 9"  (1.753 m), weight 104.1 kg (229 lb 9.6 oz), SpO2 100 %.  R IJ temp cath site OK L arm min edema Lungs clear S1S2 No S3 Abd soft No LE edema  Recent Labs  Lab 02/08/18 0333 02/09/18 0442 02/10/18 0533 02/11/18 0426 02/12/18 0332 02/13/18 0330 02/14/18 0236  NA 140 143 138 140 142 138 139  K 4.0 4.1 3.8 3.7 4.2 3.9 4.6  CL 108 109 102 104 103 102 103  CO2 21* 21* 22 22 24  21* 23  GLUCOSE 76 75 83 92 91 109* 95  BUN 29* 33* 24* 32* 38* 40* 39*  CREATININE 9.67* 11.03* 8.60* 9.77* 9.96* 9.20* 8.47*  CALCIUM 8.0* 8.3* 8.2* 8.4* 8.7* 8.2* 8.4*  PHOS 4.7* 5.6* 4.8* 5.3* 6.7* 6.2* 6.5*    Recent Labs  Lab 02/12/18 0332 02/13/18 0330 02/14/18 0236  ALBUMIN 3.6 3.3* 3.3*   Iron/TIBC/Ferritin/ %Sat    Component Value Date/Time   IRON 69 02/14/2018 0236   TIBC 232 (L) 02/14/2018 0236   IRONPCTSAT 30 02/14/2018 0236   Recent Labs  Lab 02/11/18 0426 02/12/18 0332 02/13/18 0330 02/14/18 0236  WBC 7.2 8.6 7.8  8.1  HGB 9.0* 9.3* 8.1* 7.7*  HCT 29.1* 29.5* 26.2* 25.2*  MCV 70.1* 69.9* 69.9* 70.6*  PLT 112* 120* 123* 154    Recent Labs  Lab 02/10/18 0633 02/11/18 0600 02/12/18 0626 02/13/18 0616 02/14/18 0625  GLUCAP 88 92 87 84 72   Medications: . sodium chloride    . sodium chloride    . sodium chloride    . sodium chloride    . sodium chloride    . sodium chloride    . heparin 1,000 Units/hr (02/13/18 1043)   . amLODipine  5 mg Oral Daily  . ARIPiprazole  2 mg Oral Daily  . ezetimibe  10 mg Oral Daily  . famotidine  20 mg Oral Daily  . gabapentin  300 mg Oral QHS  . heparin  40 Units/kg Dialysis Once in dialysis  . levothyroxine  25 mcg Oral QAC breakfast  . lidocaine  1 application Urethral Once  . polyethylene glycol  17 g Oral Daily  . senna-docusate  1 tablet Oral BID  . sodium chloride flush  3 mL Intravenous Q12H  . topiramate  100 mg Oral BID  .  venlafaxine XR  75 mg Oral Q breakfast    Assessment/Recommendations   1. AKI - post angioject procedure/venogram/CT angio for LUE DVT. Contrast + pigment nephropathy. Has required 2 HD TMTs, last one was 3/2, good UOP, creatinine has turned the corner, clearly falling now. I think in recovery phase 1. No indication for HD.  If down again tomorrow can d/c the temp cath 2. Trend labs and exam.  2. Gross hematuria with clots - Dr. Berneice Heinrich has seen. No bladder irrigation unless frank clot retention. Will need outpt cysto per his note.  3. LUE and central DVT - brachial/subclavian/innominate. Vascular following. Remains on heparin  4. Anemia - Hb dropping with sig gross hematuria. For transfusion today. TSat good. Looks like all blood loss currently. 5. Sz disorder - Topomax 6. Dysphagia - poss esoph stricture Ba esophagram 2/22. Outpt eval planned  Camille Bal, MD [facility] [phone] pager 02/14/2018, 11:12 AM

## 2018-02-14 NOTE — Progress Notes (Signed)
  Progress Note    02/14/2018 7:46 AM 14 Days Post-Op  Subjective:  Pt upset about the pain she is having.  Says when she tries to pee, it hurts so bad.  Says she would rather go off and have a 12lb baby by herself than continue to have this pain.  She is tearful this morning.  Afebrile HR 90's-100's  130's - 140's systolic 100% RA  Vitals:   02/13/18 2034 02/14/18 0623  BP: (!) 148/80 136/75  Pulse: 72 76  Resp: 20 20  Temp: 98 F (36.7 C) 98.1 F (36.7 C)  SpO2: 100% 100%    Physical Exam: general:  Tearful and upset Lungs:  Non labored Extremities:  Left radial pulse is palpable; swelling in left arm is better   CBC    Component Value Date/Time   WBC 8.1 02/14/2018 0236   RBC 3.57 (L) 02/14/2018 0236   HGB 7.7 (L) 02/14/2018 0236   HGB 11.7 07/06/2014 1504   HGB 11.7 07/01/2009 0910   HCT 25.2 (L) 02/14/2018 0236   HCT 36.8 07/06/2014 1504   HCT 36.4 07/01/2009 0910   PLT 154 02/14/2018 0236   PLT 204 07/06/2014 1504   PLT 190 07/01/2009 0910   MCV 70.6 (L) 02/14/2018 0236   MCV 72 (L) 07/06/2014 1504   MCV 70.7 (L) 07/01/2009 0910   MCH 21.6 (L) 02/14/2018 0236   MCHC 30.6 02/14/2018 0236   RDW 16.9 (H) 02/14/2018 0236   RDW 14.8 07/06/2014 1504   RDW 15.0 (H) 07/01/2009 0910   LYMPHSABS 1.7 01/30/2018 1248   LYMPHSABS 1.4 07/06/2014 1504   LYMPHSABS 1.0 07/01/2009 0910   MONOABS 0.5 01/30/2018 1248   MONOABS 0.4 07/01/2009 0910   EOSABS 0.3 01/30/2018 1248   EOSABS 0.2 07/06/2014 1504   BASOSABS 0.0 01/30/2018 1248   BASOSABS 0.0 07/06/2014 1504   BASOSABS 0.0 07/01/2009 0910    BMET    Component Value Date/Time   NA 139 02/14/2018 0236   NA 143 06/09/2013 1118   K 4.6 02/14/2018 0236   K 3.7 06/09/2013 1118   CL 103 02/14/2018 0236   CL 108 06/09/2013 1118   CO2 23 02/14/2018 0236   CO2 27 06/09/2013 1118   GLUCOSE 95 02/14/2018 0236   GLUCOSE 97 06/09/2013 1118   BUN 39 (H) 02/14/2018 0236   BUN 12 06/09/2013 1118   CREATININE 8.47  (H) 02/14/2018 0236   CREATININE 1.09 03/09/2014 0745   CALCIUM 8.4 (L) 02/14/2018 0236   CALCIUM 9.6 06/09/2013 1118   GFRNONAA 5 (L) 02/14/2018 0236   GFRAA 5 (L) 02/14/2018 0236    INR    Component Value Date/Time   INR 0.91 01/30/2018 1248     Intake/Output Summary (Last 24 hours) at 02/14/2018 0746 Last data filed at 02/14/2018 0100 Gross per 24 hour  Intake 240 ml  Output 3100 ml  Net -2860 ml     Assessment:  57 y.o. female is s/p:  Angiojet L arm and central DVT complicated by AKI  14 Days Post-Op  Plan: -continue heparin -pt making urine-creatinine decreased. -continues to have painful urination-she had a u/a on 3/1-may benefit from another. -urology consulted on pt 02/12/18 -DVT prophylaxis:  Heparin gtt   Doreatha MassedSamantha Emrie Gayle, PA-C [facility] [phone] 02/14/2018 7:46 AM

## 2018-02-14 NOTE — Progress Notes (Signed)
PROGRESS NOTE    Heather Hanson   WUJ:[id number]  DOB: October 14, 1961  DOA: 01/30/2018 PCP: Macy Mis, MD   Brief Narrative:  Heather Hanson  is a 57 year old female with medical history significant for hyperlipidemia, GERD, hypothyroidism, depression and anxiety who presented to the emergency department complaining of left upper arm pain and swelling.   ED evaluation revealed left upper arm swelling and venous Doppler showed Left arm occlusive subclavian DVT and innominate vein nonocclusive DVT and Left basilic superficial venous thrombosis.  Vascular surgery was consulted and recommended admission for possible TPA.   Subjective: Still having pain with urination and bloody urine. States she is also having bladder spasms. She has not other complaints.  ROS: no complaints of nausea, vomiting, constipation diarrhea, cough, dyspnea. N  Assessment & Plan:   Principal Problem:   Acute deep vein thrombosis (DVT) of left upper extremity  - etiology uncertain- will need outpt hypercoagulable work up and hematology follow up - s/p angiojet thrombectomy on 2/21 with good results - she does have remaining clot and needs to continue anticoagulation - currently on a heparin infusion being managed by vascular surgery   Active Problems: AKI -  Contrast induced nephropathy - appreciate management per nephrology- s/p dialysis x 2 - follow Cr - no dialysis needs today- if she remains stable, can d/c temp dialysis cath tomorrow  Gross Hematuria with passage of blood clots - unfortunately, unable to stop Heparin infusion due to above - urology feels that as long as she is voiding, she does not need any further intervention   - has no urinary retention but voiding is painful-she is having bladder spasms- d/w Dr Berneice Heinrich- he plans on seeing her again today  Left breast Hematoma - due to TPA- stable   Chronic dysphagia - cont to follow - needs outpt eval  Anemia of chronic disease -  following hemoglobin daily and noted to drop to 7.7 today- will be transfusing 1 U PRBC - d/w patient who is in agreement  Acute thrombocytopenia -  may be consumption due to acute clot and then due to hematuria - has normalized   Seizure disorder - cont Topamax  DVT prophylaxis: Heparin infusion Code Status: full code Family Communication:  Disposition Plan: home when stable Consultants:   Vascular surgery  nephrology Procedures:  Venous duplex L arm IMPRESSION: 1. Positive for deep venous thrombosis involving the paired brachial veins in the subclavian vein with thrombus extending into the innominate vein. The brachial venous DVT is occlusive while the subclavian and innominate DVT is nonocclusive. 2. Positive for superficial venous thrombosis involving the left basilic vein.  TPA  Dialysis cath Antimicrobials:  Anti-infectives (From admission, onward)   Start     Dose/Rate Route Frequency Ordered Stop   02/02/18 1200  fluconazole (DIFLUCAN) tablet 100 mg  Status:  Discontinued     100 mg Oral Daily 02/02/18 1155 02/07/18 0913   01/30/18 1515  clindamycin (CLEOCIN) IVPB 900 mg     900 mg 100 mL/hr over 30 Minutes Intravenous  Once 01/30/18 1510 01/30/18 1608       Objective: Vitals:   02/13/18 0944 02/13/18 1311 02/13/18 2034 02/14/18 0623  BP: 126/83 112/70 (!) 148/80 136/75  Pulse:   72 76  Resp:  20 20 20   Temp:  (!) 97.4 F (36.3 C) 98 F (36.7 C) 98.1 F (36.7 C)  TempSrc:  Oral Oral Oral  SpO2:  100% 100% 100%  Weight:  Height:        Intake/Output Summary (Last 24 hours) at 02/14/2018 1114 Last data filed at 02/14/2018 0641 Gross per 24 hour  Intake 240 ml  Output 2750 ml  Net -2510 ml   Filed Weights   02/09/18 2016 02/12/18 0900 02/13/18 0518  Weight: 105.9 kg (233 lb 7.5 oz) 99.8 kg (220 lb) 104.1 kg (229 lb 9.6 oz)    Examination: General exam: Appears comfortable  HEENT: PERRLA, oral mucosa moist, no sclera icterus or  thrush Respiratory system: Clear to auscultation. Respiratory effort normal. Cardiovascular system: S1 & S2 heard, RRR.  No murmurs  Gastrointestinal system: Abdomen soft,  Mildly tender in suprapubic area, nondistended. Normal bowel sound. No organomegaly Central nervous system: Alert and oriented. No focal neurological deficits. Extremities: No cyanosis, clubbing or edema Skin: No rashes or ulcers Psychiatry:  Mood & affect appropriate.     Data Reviewed: I have personally reviewed following labs and imaging studies  CBC: Recent Labs  Lab 02/10/18 0533 02/11/18 0426 02/12/18 0332 02/13/18 0330 02/14/18 0236  WBC 7.6 7.2 8.6 7.8 8.1  HGB 9.9* 9.0* 9.3* 8.1* 7.7*  HCT 30.9* 29.1* 29.5* 26.2* 25.2*  MCV 69.6* 70.1* 69.9* 69.9* 70.6*  PLT 103* 112* 120* 123* 154   Basic Metabolic Panel: Recent Labs  Lab 02/10/18 0533 02/11/18 0426 02/12/18 0332 02/13/18 0330 02/14/18 0236  NA 138 140 142 138 139  K 3.8 3.7 4.2 3.9 4.6  CL 102 104 103 102 103  CO2 22 22 24  21* 23  GLUCOSE 83 92 91 109* 95  BUN 24* 32* 38* 40* 39*  CREATININE 8.60* 9.77* 9.96* 9.20* 8.47*  CALCIUM 8.2* 8.4* 8.7* 8.2* 8.4*  PHOS 4.8* 5.3* 6.7* 6.2* 6.5*   GFR: Estimated Creatinine Clearance: 9.3 mL/min (A) (by C-G formula based on SCr of 8.47 mg/dL (H)). Liver Function Tests: Recent Labs  Lab 02/10/18 0533 02/11/18 0426 02/12/18 0332 02/13/18 0330 02/14/18 0236  ALBUMIN 3.4* 3.4* 3.6 3.3* 3.3*   No results for input(s): LIPASE, AMYLASE in the last 168 hours. No results for input(s): AMMONIA in the last 168 hours. Coagulation Profile: No results for input(s): INR, PROTIME in the last 168 hours. Cardiac Enzymes: No results for input(s): CKTOTAL, CKMB, CKMBINDEX, TROPONINI in the last 168 hours. BNP (last 3 results) No results for input(s): PROBNP in the last 8760 hours. HbA1C: No results for input(s): HGBA1C in the last 72 hours. CBG: Recent Labs  Lab 02/10/18 0633 02/11/18 0600  02/12/18 0626 02/13/18 0616 02/14/18 0625  GLUCAP 88 92 87 84 72   Lipid Profile: No results for input(s): CHOL, HDL, LDLCALC, TRIG, CHOLHDL, LDLDIRECT in the last 72 hours. Thyroid Function Tests: No results for input(s): TSH, T4TOTAL, FREET4, T3FREE, THYROIDAB in the last 72 hours. Anemia Panel: Recent Labs    02/14/18 0236  TIBC 232*  IRON 69   Urine analysis:    Component Value Date/Time   COLORURINE RED (A) 02/14/2018 1048   APPEARANCEUR TURBID (A) 02/14/2018 1048   LABSPEC  02/14/2018 1048    TEST NOT REPORTED DUE TO COLOR INTERFERENCE OF URINE PIGMENT   LABSPEC 1.020 06/05/2006 0845   PHURINE  02/14/2018 1048    TEST NOT REPORTED DUE TO COLOR INTERFERENCE OF URINE PIGMENT   GLUCOSEU (A) 02/14/2018 1048    TEST NOT REPORTED DUE TO COLOR INTERFERENCE OF URINE PIGMENT   HGBUR (A) 02/14/2018 1048    TEST NOT REPORTED DUE TO COLOR INTERFERENCE OF URINE PIGMENT   BILIRUBINUR (  A) 02/14/2018 1048    TEST NOT REPORTED DUE TO COLOR INTERFERENCE OF URINE PIGMENT   BILIRUBINUR Negative 06/05/2006 0845   KETONESUR (A) 02/14/2018 1048    TEST NOT REPORTED DUE TO COLOR INTERFERENCE OF URINE PIGMENT   PROTEINUR (A) 02/14/2018 1048    TEST NOT REPORTED DUE TO COLOR INTERFERENCE OF URINE PIGMENT   UROBILINOGEN 0.2 09/30/2013 2344   NITRITE NEGATIVE 02/14/2018 1048   LEUKOCYTESUR NEGATIVE 02/14/2018 1048   LEUKOCYTESUR Negative 06/05/2006 0845   Sepsis Labs: @LABRCNTIP (procalcitonin:4,lacticidven:4) )No results found for this or any previous visit (from the past 240 hour(s)).       Radiology Studies: Ct Renal Stone Study  Result Date: 02/12/2018 CLINICAL DATA:  Hematuria and right-sided abdominal pain x2 weeks. Rule out urolithiasis. EXAM: CT ABDOMEN AND PELVIS WITHOUT CONTRAST TECHNIQUE: Multidetector CT imaging of the abdomen and pelvis was performed following the standard protocol without IV contrast. COMPARISON:  07/16/2013 CT, 02/02/2018 renal ultrasound FINDINGS: Lower  chest: Normal heart size without pericardial effusion. Hepatobiliary: Small subcentimeter hypodensities in the left hepatic lobe statistically consistent with cysts or hemangiomata, relatively stable since 2014 and consistent with benign findings. Status post cholecystectomy. No biliary dilatation. Pancreas: No ductal dilatation or inflammation. No mass is identified given limitations of a noncontrast study. Spleen: Small adjacent splenule.  Normal size spleen. Adrenals/Urinary Tract: Normal bilateral adrenal glands. Punctate nonobstructing interpolar right renal calculus best seen on coronal reformatted image 57. No hydroureteronephrosis. Stable right-sided phleboliths are noted within the pelvis relative to previous CT from 2014. No bladder mass or calculus is apparent on this unenhanced study. Slightly thick-walled appearance of the bladder is likely due to underdistention. The possibility of a cystitis is not entirely excluded a can be correlated. Stomach/Bowel: Stomach is within normal limits. Appendix appears normal. A large amount of fecal retention is seen throughout the colon. No evidence of bowel wall thickening, distention, or inflammatory changes. Vascular/Lymphatic: Aortic atherosclerosis. No enlarged abdominal or pelvic lymph nodes. Reproductive: Status post hysterectomy. No adnexal masses. Other: No free air.  No abdominopelvic ascites. Musculoskeletal: Marked joint space narrowing of both hips. No acute nor suspicious osseous abnormalities. IMPRESSION: 1. Punctate right-sided nonobstructing interpolar renal calculus. No hydroureteronephrosis. 2. Slightly thick-walled appearance of the urinary bladder is likely due to underdistention. Cystitis is not excluded but believed less likely. 3. Small subcentimeter hypodensities in the left hepatic lobe dating back to 2014 consistent with benign findings, statistically more likely to represent hepatic cysts or hemangiomata. 4. Increased fecal residue  throughout the colon. Query constipation. Electronically Signed   By: Tollie Ethavid  Kwon M.D.   On: 02/12/2018 17:14      Scheduled Meds: . amLODipine  5 mg Oral Daily  . ARIPiprazole  2 mg Oral Daily  . ezetimibe  10 mg Oral Daily  . famotidine  20 mg Oral Daily  . gabapentin  300 mg Oral QHS  . heparin  40 Units/kg Dialysis Once in dialysis  . levothyroxine  25 mcg Oral QAC breakfast  . lidocaine  1 application Urethral Once  . polyethylene glycol  17 g Oral Daily  . senna-docusate  1 tablet Oral BID  . sodium chloride flush  3 mL Intravenous Q12H  . topiramate  100 mg Oral BID  . venlafaxine XR  75 mg Oral Q breakfast   Continuous Infusions: . sodium chloride    . sodium chloride    . sodium chloride    . sodium chloride    . sodium chloride    .  sodium chloride    . heparin 1,000 Units/hr (02/13/18 1043)     LOS: 14 days    Time spent in minutes: 30    Calvert Cantor, MD [facility] Pager: [email or web address] Password [email or web address] 02/14/2018, 11:14 AM

## 2018-02-14 NOTE — Progress Notes (Signed)
ANTICOAGULATION CONSULT NOTE - Follow Up Consult  Pharmacy Consult for Heparin Indication: DVT  Allergies  Allergen Reactions  . Aspirin Other (See Comments)    Per Dr  . Augmentin [Amoxicillin-Pot Clavulanate] Other (See Comments)    Blisters in the back of throat  . Dexilant [Dexlansoprazole] Other (See Comments)    Bowel urgency and liquid stools  . Hydrocodone-Acetaminophen Hives    REACTION: Rash  . Influenza Vaccines Swelling    Arm swelled to the size of a grapefruit  . Levsin [Hyoscyamine Sulfate] Other (See Comments)    Projectile vomitting  . Linzess [Linaclotide] Other (See Comments)    Bowel urgency and liquid stools  . Promethazine Nausea And Vomiting  . Propoxyphene N-Acetaminophen Itching    REACTION: rash  . Statins Other (See Comments)    myalgia  . Acetaminophen Itching and Rash  . Latex Itching and Rash    Rash-looks like she has the measles per pt  . Oxycodone-Acetaminophen Itching and Rash    REACTION: Rash    Patient Measurements: Height: 5\' 9"  (175.3 cm) Weight: 229 lb 9.6 oz (104.1 kg) IBW/kg (Calculated) : 66.2 Heparin Dosing Weight:  89.5 kg  Vital Signs: Temp: 98.1 F (36.7 C) (03/07 0623) Temp Source: Oral (03/07 0623) BP: 136/75 (03/07 0623) Pulse Rate: 76 (03/07 0623)  Labs: Recent Labs    02/12/18 0332 02/12/18 1306 02/13/18 0330 02/14/18 0236  HGB 9.3*  --  8.1* 7.7*  HCT 29.5*  --  26.2* 25.2*  PLT 120*  --  123* 154  HEPARINUNFRC 0.76* 0.60 0.56 0.57  CREATININE 9.96*  --  9.20* 8.47*    Estimated Creatinine Clearance: 9.3 mL/min (A) (by C-G formula based on SCr of 8.47 mg/dL (H)).  Assessment: 56 yof continuing on heparin for extensive DVT of unknown etiology, s/p lysis 2/21. C/o another bleed in in L thigh and new mass on the back of L leg (some bruising and a small hematoma on the back of her left leg) another small palpable mass on the left posterior thigh as well.   Heparin level this morning remains therapeutic at  0.57, on 1000 units/hr. Continues to have hematuria and receiving blood transfusion today- urology following. No other signs/symptoms of bleeding noted per nursing. No infusion issues. Hgb down to 7.7, plts up to 154 today.   Goal of Therapy:  Heparin level 0.3-0.7 units/ml Monitor platelets by anticoagulation protocol: Yes   Plan:  - Continue heparin to 1000 units/hr - Heparin level daily with CBC daily - Monitor for s/sx bleeding - F/u plans for PO anticoagulation as appropriate  Girard CooterKimberly Perkins, PharmD Clinical Pharmacist  Pager: [phone] Clinical Phone for 02/14/2018 until 3:30pm: [phone] If after 3:30pm, please call main pharmacy at [phone] 02/14/2018 10:56 AM

## 2018-02-14 NOTE — Progress Notes (Signed)
Subjective/Chief Complaint:   1 - Gross Hematuria - new gross hematuria with clots after being put on heparin as apart of LUE DVT treatment. UA w/o infectious parameters. Renal US 2/23 w/o hydro and preserved ureteral jets. PVR 02/2018 "0-3689mL" (acceptable). Non-con CT 02/2018 w/o hydro or large formed clot in renal pelvis / bladder.   2 - Acute Renal Failure - Baseline Cr <1.2, acute rise to 9s after large contrast load (CTA, angiography) as part of work up and treatment of LUE DVT. Still making good urine volume. No hydro on Renal US or CT.    Today "Gwen" is stable. Cr starting to trend down. UOP remains vigorous. She remains with gross hematuria and is receiving transfusion today.    Objective: Vital signs in last 24 hours: Temp:  [97.4 F (36.3 C)-98.1 F (36.7 C)] 98.1 F (36.7 C) (03/07 0623) Pulse Rate:  [72-76] 76 (03/07 0623) Resp:  [20] 20 (03/07 0623) BP: (112-148)/(70-83) 136/75 (03/07 0623) SpO2:  [100 %] 100 % (03/07 0623) Last BM Date: 02/13/18  Intake/Output from previous day: 03/06 0701 - 03/07 0700 In: 240 [P.O.:240] Out: 3700 [Urine:3700] Intake/Output this shift: No intake/output data recorded.   Physical Exam  Constitutional: She appears well-developed.  HENT:  Head: Normocephalic.  Neck: Normal range of motion.  Cardiovascular: Normal rate.  Respiratory: Effort normal.  GI: Soft.  Large truncal obesity. Old infraumbilical scar w/o hernias.   Genitourinary: Vagina normal.  Genitourinary Comments:    Neurological: She is alert.  Skin: Skin is warm.  Psychiatric: She has a normal mood and affect.     Lab Results:  Recent Labs    02/13/18 0330 02/14/18 0236  WBC 7.8 8.1  HGB 8.1* 7.7*  HCT 26.2* 25.2*  PLT 123* 154   BMET Recent Labs    02/13/18 0330 02/14/18 0236  NA 138 139  K 3.9 4.6  CL 102 103  CO2 21* 23  GLUCOSE 109* 95  BUN 40* 39*  CREATININE 9.20* 8.47*  CALCIUM 8.2* 8.4*   PT/INR No results for input(s):  LABPROT, INR in the last 72 hours. ABG No results for input(s): PHART, HCO3 in the last 72 hours.  Invalid input(s): PCO2, PO2  Studies/Results: Ct Renal Stone Study  Result Date: 02/12/2018 CLINICAL DATA:  Hematuria and right-sided abdominal pain x2 weeks. Rule out urolithiasis. EXAM: CT ABDOMEN AND PELVIS WITHOUT CONTRAST TECHNIQUE: Multidetector CT imaging of the abdomen and pelvis was performed following the standard protocol without IV contrast. COMPARISON:  07/16/2013 CT, 02/02/2018 renal ultrasound FINDINGS: Lower chest: Normal heart size without pericardial effusion. Hepatobiliary: Small subcentimeter hypodensities in the left hepatic lobe statistically consistent with cysts or hemangiomata, relatively stable since 2014 and consistent with benign findings. Status post cholecystectomy. No biliary dilatation. Pancreas: No ductal dilatation or inflammation. No mass is identified given limitations of a noncontrast study. Spleen: Small adjacent splenule.  Normal size spleen. Adrenals/Urinary Tract: Normal bilateral adrenal glands. Punctate nonobstructing interpolar right renal calculus best seen on coronal reformatted image 57. No hydroureteronephrosis. Stable right-sided phleboliths are noted within the pelvis relative to previous CT from 2014. No bladder mass or calculus is apparent on this unenhanced study. Slightly thick-walled appearance of the bladder is likely due to underdistention. The possibility of a cystitis is not entirely excluded a can be correlated. Stomach/Bowel: Stomach is within normal limits. Appendix appears normal. A large amount of fecal retention is seen throughout the colon. No evidence of bowel wall thickening, distention, or inflammatory changes. Vascular/Lymphatic: Aortic  atherosclerosis. No enlarged abdominal or pelvic lymph nodes. Reproductive: Status post hysterectomy. No adnexal masses. Other: No free air.  No abdominopelvic ascites. Musculoskeletal: Marked joint space  narrowing of both hips. No acute nor suspicious osseous abnormalities. IMPRESSION: 1. Punctate right-sided nonobstructing interpolar renal calculus. No hydroureteronephrosis. 2. Slightly thick-walled appearance of the urinary bladder is likely due to underdistention. Cystitis is not excluded but believed less likely. 3. Small subcentimeter hypodensities in the left hepatic lobe dating back to 2014 consistent with benign findings, statistically more likely to represent hepatic cysts or hemangiomata. 4. Increased fecal residue throughout the colon. Query constipation. Electronically Signed   By: Tollie Eth M.D.   On: 02/12/2018 17:14    Anti-infectives: Anti-infectives (From admission, onward)   Start     Dose/Rate Route Frequency Ordered Stop   02/02/18 1200  fluconazole (DIFLUCAN) tablet 100 mg  Status:  Discontinued     100 mg Oral Daily 02/02/18 1155 02/07/18 0913   01/30/18 1515  clindamycin (CLEOCIN) IVPB 900 mg     900 mg 100 mL/hr over 30 Minutes Intravenous  Once 01/30/18 1510 01/30/18 1608      Assessment/Plan:   1 - Gross Hematuria -  likely renal source in setting of acute renal injury + necessary blood thinners. Again do NOT favor and sort of large catheters or irrigation at this point as she is emptying well, and that could actually worsen amount of blood loss via local tissue irritation.   Pt has good understanding that should frank clot retention develop, large catheter / irrigation would be necessary.  She will need cysto as outpatient, and we will arrange for that to complete eval, but imaging rules out any large neoplasia.    2 - Acute Renal Failure - agree likely SEVERE contrast nephropathy. No urinary retention / hydro to suggest obstructive etiology.   Please call me directly with questions anytime.      [facility], Timofey Carandang 02/14/2018

## 2018-02-15 DIAGNOSIS — R319 Hematuria, unspecified: Secondary | ICD-10-CM

## 2018-02-15 LAB — TYPE AND SCREEN
ABO/RH(D): AB POS
Antibody Screen: NEGATIVE
Unit division: 0

## 2018-02-15 LAB — RENAL FUNCTION PANEL
Albumin: 3.7 g/dL (ref 3.5–5.0)
Anion gap: 15 (ref 5–15)
BUN: 40 mg/dL — AB (ref 6–20)
CHLORIDE: 104 mmol/L (ref 101–111)
CO2: 22 mmol/L (ref 22–32)
Calcium: 9 mg/dL (ref 8.9–10.3)
Creatinine, Ser: 7.53 mg/dL — ABNORMAL HIGH (ref 0.44–1.00)
GFR calc Af Amer: 6 mL/min — ABNORMAL LOW (ref >60)
GFR calc non Af Amer: 5 mL/min — ABNORMAL LOW (ref >60)
Glucose, Bld: 88 mg/dL (ref 65–99)
Phosphorus: 6.5 mg/dL — ABNORMAL HIGH (ref 2.5–4.6)
Potassium: 5.3 mmol/L — ABNORMAL HIGH (ref 3.5–5.1)
Sodium: 141 mmol/L (ref 135–145)

## 2018-02-15 LAB — CBC
HCT: 29.3 % — ABNORMAL LOW (ref 36.0–46.0)
Hemoglobin: 9.1 g/dL — ABNORMAL LOW (ref 12.0–15.0)
MCH: 23 pg — AB (ref 26.0–34.0)
MCHC: 31.1 g/dL (ref 30.0–36.0)
MCV: 74.2 fL — AB (ref 78.0–100.0)
PLATELETS: 186 10*3/uL (ref 150–400)
RBC: 3.95 MIL/uL (ref 3.87–5.11)
RDW: 18.7 % — AB (ref 11.5–15.5)
WBC: 8.8 10*3/uL (ref 4.0–10.5)

## 2018-02-15 LAB — GLUCOSE, CAPILLARY: Glucose-Capillary: 80 mg/dL (ref 65–99)

## 2018-02-15 LAB — BASIC METABOLIC PANEL
ANION GAP: 13 (ref 5–15)
BUN: 41 mg/dL — ABNORMAL HIGH (ref 6–20)
CALCIUM: 8.8 mg/dL — AB (ref 8.9–10.3)
CO2: 22 mmol/L (ref 22–32)
Chloride: 105 mmol/L (ref 101–111)
Creatinine, Ser: 7.25 mg/dL — ABNORMAL HIGH (ref 0.44–1.00)
GFR, EST AFRICAN AMERICAN: 6 mL/min — AB (ref >60)
GFR, EST NON AFRICAN AMERICAN: 6 mL/min — AB (ref >60)
Glucose, Bld: 107 mg/dL — ABNORMAL HIGH (ref 65–99)
Potassium: 4.9 mmol/L (ref 3.5–5.1)
SODIUM: 140 mmol/L (ref 135–145)

## 2018-02-15 LAB — BPAM RBC
Blood Product Expiration Date: 201903262359
ISSUE DATE / TIME: 201903071201
Unit Type and Rh: 8400

## 2018-02-15 LAB — HEPARIN LEVEL (UNFRACTIONATED): HEPARIN UNFRACTIONATED: 0.74 [IU]/mL — AB (ref 0.30–0.70)

## 2018-02-15 MED ORDER — OXYCODONE HCL 5 MG PO TABS
5.0000 mg | ORAL_TABLET | Freq: Four times a day (QID) | ORAL | Status: DC | PRN
  Administered 2018-02-15 – 2018-02-17 (×2): 5 mg via ORAL
  Filled 2018-02-15 (×2): qty 1

## 2018-02-15 NOTE — Progress Notes (Signed)
PROGRESS NOTE    Heather Hanson   ZOX:[id number]  DOB: Jan 29, 1961  DOA: 01/30/2018 PCP: Macy Mis, MD   Brief Narrative:  Heather Hanson  is a 57 year old female with medical history significant for hyperlipidemia, GERD, hypothyroidism, depression and anxiety who presented to the emergency department complaining of left upper arm pain and swelling.   ED evaluation revealed left upper arm swelling and venous Doppler showed Left arm occlusive subclavian DVT and innominate vein nonocclusive DVT and Left basilic superficial venous thrombosis.  Vascular surgery was consulted and recommended admission for possible TPA.   Subjective: Still having bloody urine and pain when trying to pass urine. ROS: no complaints of nausea, vomiting, constipation diarrhea, cough, dyspnea. N  Assessment & Plan:   Principal Problem:   Acute deep vein thrombosis (DVT) of left upper extremity  - etiology uncertain- will need outpt hypercoagulable work up and hematology follow up - s/p angiojet thrombectomy on 2/21 with good results - she does have remaining clot and needs to continue anticoagulation - currently on a heparin infusion being managed by vascular surgery   Active Problems: AKI -  Contrast induced nephropathy - appreciate management per nephrology- s/p dialysis x 2 -  - Cr dropping daily now  Gross Hematuria with passage of blood clots - unfortunately, unable to stop Heparin infusion due to above - urology feels that as long as she is voiding, she does not need any further intervention   - has no urinary retention but voiding is painful-she is having bladder spasms- Dr Berneice Heinrich following- cont to follow  Mild hyperkalemia today - repeat this evening  Left breast Hematoma - due to TPA- stable   Chronic dysphagia - cont to follow - needs outpt eval  Anemia of chronic disease - following hemoglobin daily and noted to drop to 7.7 today - transfused 1 U PRBC on 3/7- cont to  follow Hb   Acute thrombocytopenia -  may be consumption due to acute clot and then due to hematuria - has normalized   Seizure disorder - cont Topamax  DVT prophylaxis: Heparin infusion Code Status: full code Family Communication:  Disposition Plan: home when stable Consultants:   Vascular surgery  nephrology Procedures:  Venous duplex L arm IMPRESSION: 1. Positive for deep venous thrombosis involving the paired brachial veins in the subclavian vein with thrombus extending into the innominate vein. The brachial venous DVT is occlusive while the subclavian and innominate DVT is nonocclusive. 2. Positive for superficial venous thrombosis involving the left basilic vein.  TPA  Dialysis cath Antimicrobials:  Anti-infectives (From admission, onward)   Start     Dose/Rate Route Frequency Ordered Stop   02/02/18 1200  fluconazole (DIFLUCAN) tablet 100 mg  Status:  Discontinued     100 mg Oral Daily 02/02/18 1155 02/07/18 0913   01/30/18 1515  clindamycin (CLEOCIN) IVPB 900 mg     900 mg 100 mL/hr over 30 Minutes Intravenous  Once 01/30/18 1510 01/30/18 1608       Objective: Vitals:   02/14/18 1419 02/14/18 1950 02/15/18 0420 02/15/18 1053  BP: 115/69 (!) 138/96 113/69 130/88  Pulse: 70  66 68  Resp: 18 20 16 17   Temp: 98 F (36.7 C) 98.1 F (36.7 C) 97.9 F (36.6 C) 97.8 F (36.6 C)  TempSrc: Oral Oral Oral Oral  SpO2:  100% 100% 100%  Weight:      Height:        Intake/Output Summary (Last 24 hours) at 02/15/2018  1553 Last data filed at 02/15/2018 1100 Gross per 24 hour  Intake 529.73 ml  Output 950 ml  Net -420.27 ml   Filed Weights   02/09/18 2016 02/12/18 0900 02/13/18 0518  Weight: 105.9 kg (233 lb 7.5 oz) 99.8 kg (220 lb) 104.1 kg (229 lb 9.6 oz)    Examination: General exam: Appears comfortable  HEENT: PERRLA, oral mucosa moist, no sclera icterus or thrush Respiratory system: Clear to auscultation. Respiratory effort normal. Cardiovascular  system: S1 & S2 heard, RRR.  No murmurs  Gastrointestinal system: Abdomen soft,  Mildly tender in suprapubic area, nondistended. Normal bowel sound. No organomegaly Central nervous system: Alert and oriented. No focal neurological deficits. Extremities: No cyanosis, clubbing or edema Skin: No rashes or ulcers Psychiatry:  Mood & affect appropriate.     Data Reviewed: I have personally reviewed following labs and imaging studies  CBC: Recent Labs  Lab 02/11/18 0426 02/12/18 0332 02/13/18 0330 02/14/18 0236 02/14/18 1716 02/15/18 0730  WBC 7.2 8.6 7.8 8.1  --  8.8  HGB 9.0* 9.3* 8.1* 7.7* 9.2* 9.1*  HCT 29.1* 29.5* 26.2* 25.2* 29.5* 29.3*  MCV 70.1* 69.9* 69.9* 70.6*  --  74.2*  PLT 112* 120* 123* 154  --  186   Basic Metabolic Panel: Recent Labs  Lab 02/11/18 0426 02/12/18 0332 02/13/18 0330 02/14/18 0236 02/15/18 0730  NA 140 142 138 139 141  K 3.7 4.2 3.9 4.6 5.3*  CL 104 103 102 103 104  CO2 22 24 21* 23 22  GLUCOSE 92 91 109* 95 88  BUN 32* 38* 40* 39* 40*  CREATININE 9.77* 9.96* 9.20* 8.47* 7.53*  CALCIUM 8.4* 8.7* 8.2* 8.4* 9.0  PHOS 5.3* 6.7* 6.2* 6.5* 6.5*   GFR: Estimated Creatinine Clearance: 10.5 mL/min (A) (by C-G formula based on SCr of 7.53 mg/dL (H)). Liver Function Tests: Recent Labs  Lab 02/11/18 0426 02/12/18 0332 02/13/18 0330 02/14/18 0236 02/15/18 0730  ALBUMIN 3.4* 3.6 3.3* 3.3* 3.7   No results for input(s): LIPASE, AMYLASE in the last 168 hours. No results for input(s): AMMONIA in the last 168 hours. Coagulation Profile: No results for input(s): INR, PROTIME in the last 168 hours. Cardiac Enzymes: No results for input(s): CKTOTAL, CKMB, CKMBINDEX, TROPONINI in the last 168 hours. BNP (last 3 results) No results for input(s): PROBNP in the last 8760 hours. HbA1C: No results for input(s): HGBA1C in the last 72 hours. CBG: Recent Labs  Lab 02/11/18 0600 02/12/18 0626 02/13/18 0616 02/14/18 0625 02/15/18 0624  GLUCAP 92 87  84 72 80   Lipid Profile: No results for input(s): CHOL, HDL, LDLCALC, TRIG, CHOLHDL, LDLDIRECT in the last 72 hours. Thyroid Function Tests: No results for input(s): TSH, T4TOTAL, FREET4, T3FREE, THYROIDAB in the last 72 hours. Anemia Panel: Recent Labs    02/14/18 0236  TIBC 232*  IRON 69   Urine analysis:    Component Value Date/Time   COLORURINE RED (A) 02/14/2018 1048   APPEARANCEUR TURBID (A) 02/14/2018 1048   LABSPEC  02/14/2018 1048    TEST NOT REPORTED DUE TO COLOR INTERFERENCE OF URINE PIGMENT   LABSPEC 1.020 06/05/2006 0845   PHURINE  02/14/2018 1048    TEST NOT REPORTED DUE TO COLOR INTERFERENCE OF URINE PIGMENT   GLUCOSEU (A) 02/14/2018 1048    TEST NOT REPORTED DUE TO COLOR INTERFERENCE OF URINE PIGMENT   HGBUR (A) 02/14/2018 1048    TEST NOT REPORTED DUE TO COLOR INTERFERENCE OF URINE PIGMENT   BILIRUBINUR (A) 02/14/2018  1048    TEST NOT REPORTED DUE TO COLOR INTERFERENCE OF URINE PIGMENT   BILIRUBINUR Negative 06/05/2006 0845   KETONESUR (A) 02/14/2018 1048    TEST NOT REPORTED DUE TO COLOR INTERFERENCE OF URINE PIGMENT   PROTEINUR (A) 02/14/2018 1048    TEST NOT REPORTED DUE TO COLOR INTERFERENCE OF URINE PIGMENT   UROBILINOGEN 0.2 09/30/2013 2344   NITRITE NEGATIVE 02/14/2018 1048   LEUKOCYTESUR NEGATIVE 02/14/2018 1048   LEUKOCYTESUR Negative 06/05/2006 0845   Sepsis Labs: @LABRCNTIP (procalcitonin:4,lacticidven:4) )No results found for this or any previous visit (from the past 240 hour(s)).       Radiology Studies: No results found.    Scheduled Meds: . amLODipine  5 mg Oral Daily  . ARIPiprazole  2 mg Oral Daily  . ezetimibe  10 mg Oral Daily  . famotidine  20 mg Oral Daily  . gabapentin  300 mg Oral QHS  . heparin  40 Units/kg Dialysis Once in dialysis  . levothyroxine  25 mcg Oral QAC breakfast  . lidocaine  1 application Urethral Once  . polyethylene glycol  17 g Oral Daily  . senna-docusate  1 tablet Oral BID  . sodium chloride  flush  3 mL Intravenous Q12H  . topiramate  100 mg Oral BID  . venlafaxine XR  75 mg Oral Q breakfast   Continuous Infusions: . sodium chloride    . sodium chloride    . sodium chloride    . sodium chloride    . sodium chloride    . sodium chloride    . heparin 900 Units/hr (02/15/18 1100)     LOS: 15 days    Time spent in minutes: 30    Calvert CantorSaima Annet Manukyan, MD [facility] Pager: [email or web address] Password [email or web address] 02/15/2018, 3:53 PM

## 2018-02-15 NOTE — Progress Notes (Signed)
ANTICOAGULATION CONSULT NOTE - Follow Up Consult  Pharmacy Consult for Heparin Indication: DVT  Allergies  Allergen Reactions  . Aspirin Other (See Comments)    Per Dr  . Augmentin [Amoxicillin-Pot Clavulanate] Other (See Comments)    Blisters in the back of throat  . Dexilant [Dexlansoprazole] Other (See Comments)    Bowel urgency and liquid stools  . Hydrocodone-Acetaminophen Hives    REACTION: Rash  . Influenza Vaccines Swelling    Arm swelled to the size of a grapefruit  . Levsin [Hyoscyamine Sulfate] Other (See Comments)    Projectile vomitting  . Linzess [Linaclotide] Other (See Comments)    Bowel urgency and liquid stools  . Promethazine Nausea And Vomiting  . Propoxyphene N-Acetaminophen Itching    REACTION: rash  . Statins Other (See Comments)    myalgia  . Acetaminophen Itching and Rash  . Latex Itching and Rash    Rash-looks like she has the measles per pt  . Oxycodone-Acetaminophen Itching and Rash    REACTION: Rash    Patient Measurements: Height: 5\' 9"  (175.3 cm) Weight: 229 lb 9.6 oz (104.1 kg) IBW/kg (Calculated) : 66.2 Heparin Dosing Weight:  89.5 kg  Vital Signs: Temp: 97.9 F (36.6 C) (03/08 0420) Temp Source: Oral (03/08 0420) BP: 113/69 (03/08 0420) Pulse Rate: 66 (03/08 0420)  Labs: Recent Labs    02/13/18 0330 02/14/18 0236 02/14/18 1716 02/15/18 0730  HGB 8.1* 7.7* 9.2* 9.1*  HCT 26.2* 25.2* 29.5* 29.3*  PLT 123* 154  --  186  HEPARINUNFRC 0.56 0.57  --  0.74*  CREATININE 9.20* 8.47*  --  7.53*    Estimated Creatinine Clearance: 10.5 mL/min (A) (by C-G formula based on SCr of 7.53 mg/dL (H)).  Assessment: 56 yof continuing on heparin for extensive DVT of unknown etiology, s/p lysis 2/21. C/o another bleed in in L thigh and new mass on the back of L leg (some bruising and a small hematoma on the back of her left leg) another small palpable mass on the left posterior thigh as well.   Heparin level this morning slightly high at 0.74  on 1000 units/hr.  Continues to have hematuria, HgB improved today to 9.1 after blood transfusion  Goal of Therapy:  Heparin level 0.3-0.7 units/ml Monitor platelets by anticoagulation protocol: Yes   Plan:  - Decrease heparin to 900 units / hr - Heparin level daily with CBC daily - Monitor for s/sx bleeding - F/u plans for PO anticoagulation as appropriate  Thank you Okey RegalLisa Jovaun Levene, PharmD [phone] 02/15/2018 9:52 AM

## 2018-02-15 NOTE — Progress Notes (Signed)
[facility] Rounding Note  Subjective/Interval History:  Persistent gross hematuria Transfused yesterday HD 2/27, 3/2; still has R temp cath and I think can now remove Creatinine clearly starting to fall now and urine volumes good  Objective Vital signs in last 24 hours: Vitals:   02/14/18 1950 02/15/18 0420 02/15/18 1053 02/15/18 1644  BP: (!) 138/96 113/69 130/88 134/90  Pulse:  66 68 70  Resp: 20 16 17 18   Temp: 98.1 F (36.7 C) 97.9 F (36.6 C) 97.8 F (36.6 C) 98.6 F (37 C)  TempSrc: Oral Oral Oral Oral  SpO2: 100% 100% 100% 100%  Weight:      Height:       Weight change:   Intake/Output Summary (Last 24 hours) at 02/15/2018 1713 Last data filed at 02/15/2018 1600 Gross per 24 hour  Intake 529.73 ml  Output 1400 ml  Net -870.27 ml   Physical Exam:  Blood pressure 134/90, pulse 70, temperature 98.6 F (37 C), temperature source Oral, resp. rate 18, height 5\' 9"  (1.753 m), weight 104.1 kg (229 lb 9.6 oz), SpO2 100 %.  R IJ temp cath site OK L arm min edema Lungs clear S1S2 No S3 Abd soft No LE edema  Recent Labs  Lab 02/09/18 0442 02/10/18 0533 02/11/18 0426 02/12/18 0332 02/13/18 0330 02/14/18 0236 02/15/18 0730  NA 143 138 140 142 138 139 141  K 4.1 3.8 3.7 4.2 3.9 4.6 5.3*  CL 109 102 104 103 102 103 104  CO2 21* 22 22 24  21* 23 22  GLUCOSE 75 83 92 91 109* 95 88  BUN 33* 24* 32* 38* 40* 39* 40*  CREATININE 11.03* 8.60* 9.77* 9.96* 9.20* 8.47* 7.53*  CALCIUM 8.3* 8.2* 8.4* 8.7* 8.2* 8.4* 9.0  PHOS 5.6* 4.8* 5.3* 6.7* 6.2* 6.5* 6.5*    Recent Labs  Lab 02/13/18 0330 02/14/18 0236 02/15/18 0730  ALBUMIN 3.3* 3.3* 3.7   Iron/TIBC/Ferritin/ %Sat    Component Value Date/Time   IRON 69 02/14/2018 0236   TIBC 232 (L) 02/14/2018 0236   IRONPCTSAT 30 02/14/2018 0236   Recent Labs  Lab 02/12/18 0332 02/13/18 0330 02/14/18 0236 02/14/18 1716 02/15/18 0730  WBC 8.6 7.8 8.1  --  8.8  HGB 9.3* 8.1* 7.7* 9.2* 9.1*  HCT 29.5* 26.2* 25.2* 29.5* 29.3*   MCV 69.9* 69.9* 70.6*  --  74.2*  PLT 120* 123* 154  --  186    Recent Labs  Lab 02/11/18 0600 02/12/18 0626 02/13/18 0616 02/14/18 0625 02/15/18 0624  GLUCAP 92 87 84 72 80   Medications: . sodium chloride    . sodium chloride    . sodium chloride    . sodium chloride    . sodium chloride    . sodium chloride    . heparin 900 Units/hr (02/15/18 1100)   . amLODipine  5 mg Oral Daily  . ARIPiprazole  2 mg Oral Daily  . ezetimibe  10 mg Oral Daily  . famotidine  20 mg Oral Daily  . gabapentin  300 mg Oral QHS  . heparin  40 Units/kg Dialysis Once in dialysis  . levothyroxine  25 mcg Oral QAC breakfast  . lidocaine  1 application Urethral Once  . polyethylene glycol  17 g Oral Daily  . senna-docusate  1 tablet Oral BID  . sodium chloride flush  3 mL Intravenous Q12H  . topiramate  100 mg Oral BID  . venlafaxine XR  75 mg Oral Q breakfast    Assessment/Recommendations  1. AKI - post angioject procedure/venogram/CT angio for LUE DVT. Contrast + pigment nephropathy. Has required 2 HD TMTs, last one was 3/2, good UOP, creatinine has turned the corner, clearly falling now.  1. I think in recovery phase and will not require further HD 2. Will have temp cath removed 3. Continue to trend labs 2. Gross hematuria with clots - Dr. Berneice HeinrichManny has seen. No bladder irrigation unless frank clot retention. Will need outpt cysto per his note.  3. Hyperkalemia - mild. Change diet to renal (heart healthy high in K) 4. LUE and central DVT - brachial/subclavian/innominate. Vascular following. Remains on heparin  5. Anemia - Hb drop with sig gross hematuria. Transfused 1 unit on 02/14/18.  TSat good. Looks like all blood loss currently. 6. Sz disorder - Topomax 7. Dysphagia - poss esoph stricture Ba esophagram 2/22. Outpt eval planned  Camille Balynthia Xane Amsden, MD [facility] [phone] pager 02/15/2018, 5:13 PM

## 2018-02-16 LAB — RENAL FUNCTION PANEL
ANION GAP: 17 — AB (ref 5–15)
Albumin: 3.9 g/dL (ref 3.5–5.0)
BUN: 43 mg/dL — ABNORMAL HIGH (ref 6–20)
CALCIUM: 9 mg/dL (ref 8.9–10.3)
CO2: 19 mmol/L — ABNORMAL LOW (ref 22–32)
CREATININE: 6.94 mg/dL — AB (ref 0.44–1.00)
Chloride: 103 mmol/L (ref 101–111)
GFR, EST AFRICAN AMERICAN: 7 mL/min — AB (ref >60)
GFR, EST NON AFRICAN AMERICAN: 6 mL/min — AB (ref >60)
Glucose, Bld: 95 mg/dL (ref 65–99)
Phosphorus: 6.3 mg/dL — ABNORMAL HIGH (ref 2.5–4.6)
Potassium: 5 mmol/L (ref 3.5–5.1)
Sodium: 139 mmol/L (ref 135–145)

## 2018-02-16 LAB — CBC
HCT: 30.8 % — ABNORMAL LOW (ref 36.0–46.0)
Hemoglobin: 9.5 g/dL — ABNORMAL LOW (ref 12.0–15.0)
MCH: 23 pg — ABNORMAL LOW (ref 26.0–34.0)
MCHC: 30.8 g/dL (ref 30.0–36.0)
MCV: 74.6 fL — ABNORMAL LOW (ref 78.0–100.0)
PLATELETS: 226 10*3/uL (ref 150–400)
RBC: 4.13 MIL/uL (ref 3.87–5.11)
RDW: 18.9 % — ABNORMAL HIGH (ref 11.5–15.5)
WBC: 10.6 10*3/uL — AB (ref 4.0–10.5)

## 2018-02-16 LAB — GLUCOSE, CAPILLARY: GLUCOSE-CAPILLARY: 90 mg/dL (ref 65–99)

## 2018-02-16 LAB — HEPARIN LEVEL (UNFRACTIONATED): HEPARIN UNFRACTIONATED: 0.6 [IU]/mL (ref 0.30–0.70)

## 2018-02-16 MED ORDER — APIXABAN 5 MG PO TABS
5.0000 mg | ORAL_TABLET | Freq: Once | ORAL | Status: AC
  Administered 2018-02-16: 5 mg via ORAL

## 2018-02-16 MED ORDER — APIXABAN 5 MG PO TABS
5.0000 mg | ORAL_TABLET | Freq: Two times a day (BID) | ORAL | Status: DC
  Administered 2018-02-17 – 2018-02-19 (×6): 5 mg via ORAL
  Filled 2018-02-16 (×7): qty 1

## 2018-02-16 NOTE — Progress Notes (Addendum)
PROGRESS NOTE    Heather Hanson   ZOX:[id number]  DOB: [birth year]  DOA: 01/30/2018 PCP: Macy Mis, MD   Brief Narrative:  Heather Hanson  is a 57 year old female with medical history significant for hyperlipidemia, GERD, hypothyroidism, depression and anxiety who presented to the emergency department complaining of left upper arm pain and swelling.   ED evaluation revealed left upper arm swelling and venous Doppler showed Left arm occlusive subclavian DVT and innominate vein nonocclusive DVT and Left basilic superficial venous thrombosis.   Subjective: Continues to have grossly bloody urine and pain with voiding.  ROS: no complaints of nausea, vomiting, constipation diarrhea, cough, dyspnea.  Assessment & Plan:   Principal Problem:   Acute deep vein thrombosis (DVT) of left upper extremity  - note venous duplex report below - etiology uncertain - will need outpt hypercoagulable work up and hematology follow up - s/p angiojet thrombectomy on 2/21 with good results - she does have remaining clot and needs to continue anticoagulation - currently on a heparin infusion being managed by vascular surgery- spoke with Dr Arbie Cookey  - he states its reasonable to switch to oral anticoagulation - Cr too high for Xarelto-  Has been started on Eliquis   Active Problems: AKI -  Contrast induced nephropathy - appreciate management per nephrology- s/p dialysis x 2 -  - Cr dropping daily now and no further needs for dialysis- temp cath removed on 3/8  Gross Hematuria with passage of blood clots - unfortunately, unable to stop Heparin infusion due DVT - urology feels that as long as she is voiding, she does not need any further intervention   - has no urinary retention but voiding is painful- Urology following intermittently  Anemia of chronic disease and acute blood loss - Hb 12.1 on admission- noted to steadily drop to 7.7 on 3/6 - transfused 1 U PRBC on 3/7- cont to follow Hb  -  anemia panel shows adequate stores of Iron  Mild hyperkalemia 3/8 -improving  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 Positive for deep venous thrombosis involving the paired brachial veins in the subclavian vein with thrombus extending into the innominate vein. The brachial venous DVT is occlusive while the subclavian and innominate DVT is nonocclusive. 2. Positive for superficial venous thrombosis involving the left basilic vein.  TPA  Dialysis cath Antimicrobials:  Anti-infectives (From admission, onward)   Start     Dose/Rate Route Frequency Ordered Stop   02/02/18 1200  fluconazole (DIFLUCAN) tablet 100 mg  Status:  Discontinued     100 mg Oral Daily 02/02/18 1155 02/07/18 0913   01/30/18 1515  clindamycin (CLEOCIN) IVPB 900 mg     900 mg 100 mL/hr over 30 Minutes Intravenous  Once 01/30/18 1510 01/30/18 1608       Objective: Vitals:   02/15/18 2136 02/16/18 0218 02/16/18 0629 02/16/18 1423  BP: 132/90  (!) 135/91 109/63  Pulse:   85 97  Resp: 16     Temp: 97.8 F (36.6 C)  (!) 97.3 F (36.3 C) (!) 97.5 F (  36.4 C)  TempSrc:   Oral Oral  SpO2:   97% 98%  Weight:  101.7 kg (224 lb 4.8 oz)    Height:        Intake/Output Summary (Last 24 hours) at 02/16/2018 1514 Last data filed at 02/16/2018 0443 Gross per 24 hour  Intake -  Output 1500 ml  Net -1500 ml   Filed Weights   02/12/18 0900 02/13/18 0518 02/16/18 0218  Weight: 99.8 kg (220 lb) 104.1 kg (229 lb 9.6 oz) 101.7 kg (224 lb 4.8 oz)    Examination: General exam: Appears  comfortable  HEENT: PERRLA, oral mucosa moist, no sclera icterus or thrush Respiratory system: Clear to auscultation. Respiratory effort normal. Cardiovascular system: S1 & S2 heard, RRR.  No murmurs  Gastrointestinal system: Abdomen soft,  Mildly tender in suprapubic area, nondistended. Normal bowel sound. No organomegaly Central nervous system: Alert and oriented. No focal neurological deficits. Extremities: No cyanosis, clubbing or edema Skin: No rashes or ulcers Psychiatry:  Mood & affect appropriate.     Data Reviewed: I have personally reviewed following labs and imaging studies  CBC: Recent Labs  Lab 02/12/18 0332 02/13/18 0330 02/14/18 0236 02/14/18 1716 02/15/18 0730 02/16/18 0305  WBC 8.6 7.8 8.1  --  8.8 10.6*  HGB 9.3* 8.1* 7.7* 9.2* 9.1* 9.5*  HCT 29.5* 26.2* 25.2* 29.5* 29.3* 30.8*  MCV 69.9* 69.9* 70.6*  --  74.2* 74.6*  PLT 120* 123* 154  --  186 226   Basic Metabolic Panel: Recent Labs  Lab 02/12/18 0332 02/13/18 0330 02/14/18 0236 02/15/18 0730 02/15/18 1703 02/16/18 0305  NA 142 138 139 141 140 139  K 4.2 3.9 4.6 5.3* 4.9 5.0  CL 103 102 103 104 105 103  CO2 24 21* 23 22 22  19*  GLUCOSE 91 109* 95 88 107* 95  BUN 38* 40* 39* 40* 41* 43*  CREATININE 9.96* 9.20* 8.47* 7.53* 7.25* 6.94*  CALCIUM 8.7* 8.2* 8.4* 9.0 8.8* 9.0  PHOS 6.7* 6.2* 6.5* 6.5*  --  6.3*   GFR: Estimated Creatinine Clearance: 11.2 mL/min (A) (by C-G formula based on SCr of 6.94 mg/dL (H)). Liver Function Tests: Recent Labs  Lab 02/12/18 0332 02/13/18 0330 02/14/18 0236 02/15/18 0730 02/16/18 0305  ALBUMIN 3.6 3.3* 3.3* 3.7 3.9   No results for input(s): LIPASE, AMYLASE in the last 168 hours. No results for input(s): AMMONIA in the last 168 hours. Coagulation Profile: No results for input(s): INR, PROTIME in the last 168 hours. Cardiac Enzymes: No results for input(s): CKTOTAL, CKMB, CKMBINDEX, TROPONINI in the last 168 hours. BNP (last 3 results) No results for  input(s): PROBNP in the last 8760 hours. HbA1C: No results for input(s): HGBA1C in the last 72 hours. CBG: Recent Labs  Lab 02/12/18 0626 02/13/18 0616 02/14/18 0625 02/15/18 0624 02/16/18 0622  GLUCAP 87 84 72 80 90   Lipid Profile: No results for input(s): CHOL, HDL, LDLCALC, TRIG, CHOLHDL, LDLDIRECT in the last 72 hours. Thyroid Function Tests: No results for input(s): TSH, T4TOTAL, FREET4, T3FREE, THYROIDAB in the last 72 hours. Anemia Panel: Recent Labs    02/14/18 0236  TIBC 232*  IRON 69   Urine analysis:    Component Value Date/Time   COLORURINE RED (A) 02/14/2018 1048   APPEARANCEUR TURBID (A) 02/14/2018 1048   LABSPEC  02/14/2018 1048    TEST NOT REPORTED DUE TO COLOR INTERFERENCE OF URINE PIGMENT   LABSPEC 1.020 06/05/2006 0845   PHURINE  02/14/2018 1048  TEST NOT REPORTED DUE TO COLOR INTERFERENCE OF URINE PIGMENT   GLUCOSEU (A) 02/14/2018 1048    TEST NOT REPORTED DUE TO COLOR INTERFERENCE OF URINE PIGMENT   HGBUR (A) 02/14/2018 1048    TEST NOT REPORTED DUE TO COLOR INTERFERENCE OF URINE PIGMENT   BILIRUBINUR (A) 02/14/2018 1048    TEST NOT REPORTED DUE TO COLOR INTERFERENCE OF URINE PIGMENT   BILIRUBINUR Negative 06/05/2006 0845   KETONESUR (A) 02/14/2018 1048    TEST NOT REPORTED DUE TO COLOR INTERFERENCE OF URINE PIGMENT   PROTEINUR (A) 02/14/2018 1048    TEST NOT REPORTED DUE TO COLOR INTERFERENCE OF URINE PIGMENT   UROBILINOGEN 0.2 09/30/2013 2344   NITRITE NEGATIVE 02/14/2018 1048   LEUKOCYTESUR NEGATIVE 02/14/2018 1048   LEUKOCYTESUR Negative 06/05/2006 0845   Sepsis Labs: @LABRCNTIP (procalcitonin:4,lacticidven:4) )No results found for this or any previous visit (from the past 240 hour(s)).       Radiology Studies: No results found.    Scheduled Meds: . amLODipine  5 mg Oral Daily  . ARIPiprazole  2 mg Oral Daily  . ezetimibe  10 mg Oral Daily  . famotidine  20 mg Oral Daily  . gabapentin  300 mg Oral QHS  . heparin  40  Units/kg Dialysis Once in dialysis  . levothyroxine  25 mcg Oral QAC breakfast  . lidocaine  1 application Urethral Once  . polyethylene glycol  17 g Oral Daily  . senna-docusate  1 tablet Oral BID  . sodium chloride flush  3 mL Intravenous Q12H  . topiramate  100 mg Oral BID  . venlafaxine XR  75 mg Oral Q breakfast   Continuous Infusions: . sodium chloride    . sodium chloride    . sodium chloride    . sodium chloride    . sodium chloride    . sodium chloride    . heparin 900 Units/hr (02/15/18 1100)     LOS: 16 days    Time spent in minutes: 30    Calvert CantorSaima Jochebed Bills, MD [facility] Pager: [email or web address] Password [email or web address] 02/16/2018, 3:14 PM

## 2018-02-16 NOTE — Progress Notes (Signed)
[facility] Rounding Note  Subjective/Interval History:   Last HD 3/2 Steady fall in creatinine Temp cath removed yesterday Notes reviewed  Objective Vital signs in last 24 hours: Vitals:   02/15/18 1644 02/15/18 2136 02/16/18 0218 02/16/18 0629  BP: 134/90 132/90  (!) 135/91  Pulse: 70   85  Resp: 18 16    Temp: 98.6 F (37 C) 97.8 F (36.6 C)  (!) 97.3 F (36.3 C)  TempSrc: Oral   Oral  SpO2: 100%   97%  Weight:   101.7 kg (224 lb 4.8 oz)   Height:       Weight change:   Intake/Output Summary (Last 24 hours) at 02/16/2018 1210 Last data filed at 02/16/2018 0443 Gross per 24 hour  Intake -  Output 1500 ml  Net -1500 ml   Physical Exam:  Blood pressure (!) 135/91, pulse 85, temperature (!) 97.3 F (36.3 C), temperature source Oral, resp. rate 16, height 5\' 9"  (1.753 m), weight 101.7 kg (224 lb 4.8 oz), SpO2 97 %.   L arm min edema Lungs clear S1S2 No S3 Abd soft No LE edema  Recent Labs  Lab 02/10/18 0533 02/11/18 0426 02/12/18 0332 02/13/18 0330 02/14/18 0236 02/15/18 0730 02/15/18 1703 02/16/18 0305  NA 138 140 142 138 139 141 140 139  K 3.8 3.7 4.2 3.9 4.6 5.3* 4.9 5.0  CL 102 104 103 102 103 104 105 103  CO2 22 22 24  21* 23 22 22  19*  GLUCOSE 83 92 91 109* 95 88 107* 95  BUN 24* 32* 38* 40* 39* 40* 41* 43*  CREATININE 8.60* 9.77* 9.96* 9.20* 8.47* 7.53* 7.25* 6.94*  CALCIUM 8.2* 8.4* 8.7* 8.2* 8.4* 9.0 8.8* 9.0  PHOS 4.8* 5.3* 6.7* 6.2* 6.5* 6.5*  --  6.3*    Recent Labs  Lab 02/14/18 0236 02/15/18 0730 02/16/18 0305  ALBUMIN 3.3* 3.7 3.9      Component Value Date/Time   IRON 69 02/14/2018 0236   TIBC 232 (L) 02/14/2018 0236   IRONPCTSAT 30 02/14/2018 0236   Recent Labs  Lab 02/13/18 0330 02/14/18 0236 02/14/18 1716 02/15/18 0730 02/16/18 0305  WBC 7.8 8.1  --  8.8 10.6*  HGB 8.1* 7.7* 9.2* 9.1* 9.5*  HCT 26.2* 25.2* 29.5* 29.3* 30.8*  MCV 69.9* 70.6*  --  74.2* 74.6*  PLT 123* 154  --  186 226    Recent Labs  Lab 02/12/18 0626  02/13/18 0616 02/14/18 0625 02/15/18 0624 02/16/18 0622  GLUCAP 87 84 72 80 90   Medications: . sodium chloride    . sodium chloride    . sodium chloride    . sodium chloride    . sodium chloride    . sodium chloride    . heparin 900 Units/hr (02/15/18 1100)   . amLODipine  5 mg Oral Daily  . ARIPiprazole  2 mg Oral Daily  . ezetimibe  10 mg Oral Daily  . famotidine  20 mg Oral Daily  . gabapentin  300 mg Oral QHS  . heparin  40 Units/kg Dialysis Once in dialysis  . levothyroxine  25 mcg Oral QAC breakfast  . lidocaine  1 application Urethral Once  . polyethylene glycol  17 g Oral Daily  . senna-docusate  1 tablet Oral BID  . sodium chloride flush  3 mL Intravenous Q12H  . topiramate  100 mg Oral BID  . venlafaxine XR  75 mg Oral Q breakfast    Assessment/Recommendations   1. AKI -  post angioject procedure/venogram/CT angio for LUE DVT. Contrast + pigment nephropathy. Required 2 HDX2, last 3/2, good UOP, creatinine has turned the corner, clearly falling now. Baseline creatinine 1.07 01/31/18.  1. I think in recovery phase and will not require further HD 2. Unclear where renal function will level off as this was significant AKI 3. Continue to trend labs. No new recommendations. 2. Gross hematuria with clots - Dr. Berneice HeinrichManny has seen. No bladder irrigation unless frank clot retention. Will need outpt cysto per his note. Sooner rather than later would be my recommendation here 3. Hyperkalemia - mild. Changed diet to renal (heart healthy high in K) 4. LUE and central DVT - brachial/subclavian/innominate. Vascular following. Remains on heparin. See Dr. Bosie HelperEarly's note re coumadin.  5. Anemia - Hb drop with sig gross hematuria. Transfused 1 unit on 02/14/18.  TSat good. Looks like all blood loss currently. 6. Sz disorder - Topomax 7. Dysphagia - poss esoph stricture Ba esophagram 2/22. Outpt eval planned  No additional renal recommendations at this time. Continue to trend labs. If renal  function does not return to baseline (BL 1.07) happy to see as outpt for f/u.    Heather Balynthia Uchenna Seufert, MD [facility] [phone] pager 02/16/2018, 12:10 PM

## 2018-02-16 NOTE — Progress Notes (Signed)
ANTICOAGULATION CONSULT NOTE - Follow Up Consult  Pharmacy Consult for Heparin Indication: DVT  Allergies  Allergen Reactions  . Aspirin Other (See Comments)    Per Dr  . Augmentin [Amoxicillin-Pot Clavulanate] Other (See Comments)    Blisters in the back of throat  . Dexilant [Dexlansoprazole] Other (See Comments)    Bowel urgency and liquid stools  . Hydrocodone-Acetaminophen Hives    REACTION: Rash  . Influenza Vaccines Swelling    Arm swelled to the size of a grapefruit  . Levsin [Hyoscyamine Sulfate] Other (See Comments)    Projectile vomitting  . Linzess [Linaclotide] Other (See Comments)    Bowel urgency and liquid stools  . Promethazine Nausea And Vomiting  . Propoxyphene N-Acetaminophen Itching    REACTION: rash  . Statins Other (See Comments)    myalgia  . Acetaminophen Itching and Rash  . Latex Itching and Rash    Rash-looks like she has the measles per pt  . Oxycodone-Acetaminophen Itching and Rash    REACTION: Rash    Patient Measurements: Height: 5\' 9"  (175.3 cm) Weight: 224 lb 4.8 oz (101.7 kg) IBW/kg (Calculated) : 66.2 Heparin Dosing Weight:  89.5 kg  Vital Signs: Temp: 97.3 F (36.3 C) (03/09 0629) Temp Source: Oral (03/09 0629) BP: 135/91 (03/09 0629) Pulse Rate: 85 (03/09 0629)  Labs: Recent Labs    02/14/18 0236 02/14/18 1716 02/15/18 0730 02/15/18 1703 02/16/18 0305  HGB 7.7* 9.2* 9.1*  --  9.5*  HCT 25.2* 29.5* 29.3*  --  30.8*  PLT 154  --  186  --  226  HEPARINUNFRC 0.57  --  0.74*  --  0.60  CREATININE 8.47*  --  7.53* 7.25* 6.94*    Estimated Creatinine Clearance: 11.2 mL/min (A) (by C-G formula based on SCr of 6.94 mg/dL (H)).  Assessment: 56 yof continuing on heparin for extensive DVT of unknown etiology, s/p lysis 2/21. C/o another bleed in in L thigh and new mass on the back of L leg (some bruising and a small hematoma on the back of her left leg) another small palpable mass on the left posterior thigh as well.   Heparin  level therapeutic this AM Continues to have hematuria, HgB improved today to 9.5 after blood transfusion  Goal of Therapy:  Heparin level 0.3-0.7 units/ml Monitor platelets by anticoagulation protocol: Yes   Plan:  -Continue heparin at 900 units / hr - Heparin level daily with CBC daily - Monitor for s/sx bleeding - F/u plans for PO anticoagulation as appropriate  Thank you Okey RegalLisa Tila Millirons, PharmD [phone] 02/16/2018 11:28 AM

## 2018-02-16 NOTE — Progress Notes (Signed)
Patient ID: Heather Hanson, female   DOB: 1961-10-25, 57 y.o.   MRN: [id number] The patient does not have any left arm complaints.  He does not have any swelling.  Does have a 2+ left radial pulse.  She is now off hemodialysis and her temporary catheter was removed yesterday.  Her main complaint continues to be hematuria.  She reports this is quite painful and that she is voiding" macaroni".  Discussed her case with Dr.Rizwan.  He will have to be converted over to oral anticoagulant.  I discussed this with the patient as well.  I feel it is appropriate to transition to oral anticoagulation but discontinue heparin.  She is not having any bleeding.  The patient immediately stopped the anticoagulation with the new oral agent.  The patient does not want to be placed on Coumadin.  I explained that this may be related to financial well.

## 2018-02-17 DIAGNOSIS — N179 Acute kidney failure, unspecified: Secondary | ICD-10-CM

## 2018-02-17 DIAGNOSIS — R310 Gross hematuria: Secondary | ICD-10-CM

## 2018-02-17 LAB — RENAL FUNCTION PANEL
Albumin: 3.7 g/dL (ref 3.5–5.0)
Anion gap: 13 (ref 5–15)
BUN: 37 mg/dL — AB (ref 6–20)
CO2: 21 mmol/L — ABNORMAL LOW (ref 22–32)
CREATININE: 6.28 mg/dL — AB (ref 0.44–1.00)
Calcium: 9.3 mg/dL (ref 8.9–10.3)
Chloride: 106 mmol/L (ref 101–111)
GFR calc Af Amer: 8 mL/min — ABNORMAL LOW (ref >60)
GFR calc non Af Amer: 7 mL/min — ABNORMAL LOW (ref >60)
GLUCOSE: 108 mg/dL — AB (ref 65–99)
PHOSPHORUS: 6.1 mg/dL — AB (ref 2.5–4.6)
POTASSIUM: 4.9 mmol/L (ref 3.5–5.1)
Sodium: 140 mmol/L (ref 135–145)

## 2018-02-17 LAB — GLUCOSE, CAPILLARY: Glucose-Capillary: 98 mg/dL (ref 65–99)

## 2018-02-17 NOTE — Progress Notes (Signed)
ANTICOAGULATION CONSULT NOTE - Follow Up Consult  Pharmacy Consult for Heparin to Eliquis Indication: DVT  Allergies  Allergen Reactions  . Aspirin Other (See Comments)    Per Dr  . Augmentin [Amoxicillin-Pot Clavulanate] Other (See Comments)    Blisters in the back of throat  . Dexilant [Dexlansoprazole] Other (See Comments)    Bowel urgency and liquid stools  . Hydrocodone-Acetaminophen Hives    REACTION: Rash  . Influenza Vaccines Swelling    Arm swelled to the size of a grapefruit  . Levsin [Hyoscyamine Sulfate] Other (See Comments)    Projectile vomitting  . Linzess [Linaclotide] Other (See Comments)    Bowel urgency and liquid stools  . Promethazine Nausea And Vomiting  . Propoxyphene N-Acetaminophen Itching    REACTION: rash  . Statins Other (See Comments)    myalgia  . Acetaminophen Itching and Rash  . Latex Itching and Rash    Rash-looks like she has the measles per pt  . Oxycodone-Acetaminophen Itching and Rash    REACTION: Rash    Patient Measurements: Height: 5\' 9"  (175.3 cm) Weight: 224 lb 4.8 oz (101.7 kg) IBW/kg (Calculated) : 66.2 Heparin Dosing Weight:  89.5 kg  Vital Signs: Temp: 98.5 F (36.9 C) (03/10 0547) Temp Source: Oral (03/10 0547) BP: 125/79 (03/10 0741) Pulse Rate: 92 (03/10 0741)  Labs: Recent Labs    02/14/18 1716  02/15/18 0730 02/15/18 1703 02/16/18 0305 02/17/18 0428  HGB 9.2*  --  9.1*  --  9.5*  --   HCT 29.5*  --  29.3*  --  30.8*  --   PLT  --   --  186  --  226  --   HEPARINUNFRC  --   --  0.74*  --  0.60  --   CREATININE  --    < > 7.53* 7.25* 6.94* 6.28*   < > = values in this interval not displayed.    Estimated Creatinine Clearance: 12.4 mL/min (A) (by C-G formula based on SCr of 6.28 mg/dL (H)).  Assessment: 56 yof continuing on heparin for extensive DVT of unknown etiology, s/p lysis 2/21. C/o another bleed in in L thigh and new mass on the back of L leg (some bruising and a small hematoma on the back of her  left leg) another small palpable mass on the left posterior thigh as well.   Continues to have hematuria, HgB improved today to 9.5 after blood transfusion  Transitioned to Eliquis 3/9 - has been on heparin since 2/22,  Have started Eliquis at 5 mg po BID avoiding the 10 mg po BID x 7 days due to hematuria, AKI, and prolonged course of heparin s/p lysis for DVT   Goal of Therapy:  Monitor platelets by anticoagulation protocol: Yes   Plan:  Eliquis 5 mg po BID  Thank you Okey RegalLisa Bo Teicher, PharmD [phone] 02/17/2018 12:53 PM

## 2018-02-17 NOTE — Progress Notes (Signed)
Patient up ambulating in hall and room. Multiple family members in room. Patient does c/o of SOB but is able to carry on fluent conversation. No evidence of labored breathing. States that she has felt tired the whole day. States that urine is still red but is a bit better less urinary pain.

## 2018-02-18 LAB — CBC
HEMATOCRIT: 25.7 % — AB (ref 36.0–46.0)
HEMATOCRIT: 26.5 % — AB (ref 36.0–46.0)
HEMOGLOBIN: 7.8 g/dL — AB (ref 12.0–15.0)
Hemoglobin: 8.2 g/dL — ABNORMAL LOW (ref 12.0–15.0)
MCH: 22.7 pg — ABNORMAL LOW (ref 26.0–34.0)
MCH: 23.4 pg — ABNORMAL LOW (ref 26.0–34.0)
MCHC: 30.4 g/dL (ref 30.0–36.0)
MCHC: 30.9 g/dL (ref 30.0–36.0)
MCV: 74.9 fL — ABNORMAL LOW (ref 78.0–100.0)
MCV: 75.5 fL — AB (ref 78.0–100.0)
PLATELETS: 258 10*3/uL (ref 150–400)
Platelets: 257 10*3/uL (ref 150–400)
RBC: 3.43 MIL/uL — ABNORMAL LOW (ref 3.87–5.11)
RBC: 3.51 MIL/uL — AB (ref 3.87–5.11)
RDW: 18.6 % — ABNORMAL HIGH (ref 11.5–15.5)
RDW: 18.9 % — AB (ref 11.5–15.5)
WBC: 9.2 10*3/uL (ref 4.0–10.5)
WBC: 7.8 10*3/uL (ref 4.0–10.5)

## 2018-02-18 LAB — RENAL FUNCTION PANEL
ALBUMIN: 3.6 g/dL (ref 3.5–5.0)
ANION GAP: 13 (ref 5–15)
BUN: 38 mg/dL — ABNORMAL HIGH (ref 6–20)
CALCIUM: 9 mg/dL (ref 8.9–10.3)
CO2: 22 mmol/L (ref 22–32)
Chloride: 104 mmol/L (ref 101–111)
Creatinine, Ser: 5.57 mg/dL — ABNORMAL HIGH (ref 0.44–1.00)
GFR, EST AFRICAN AMERICAN: 9 mL/min — AB (ref >60)
GFR, EST NON AFRICAN AMERICAN: 8 mL/min — AB (ref >60)
Glucose, Bld: 102 mg/dL — ABNORMAL HIGH (ref 65–99)
PHOSPHORUS: 5.8 mg/dL — AB (ref 2.5–4.6)
POTASSIUM: 4.7 mmol/L (ref 3.5–5.1)
SODIUM: 139 mmol/L (ref 135–145)

## 2018-02-18 LAB — GLUCOSE, CAPILLARY: GLUCOSE-CAPILLARY: 84 mg/dL (ref 65–99)

## 2018-02-18 NOTE — Progress Notes (Addendum)
PROGRESS NOTE    Heather Hanson   BJY:[id number]  DOB: May 04, 1961  DOA: 01/30/2018 PCP: Macy Mis, MD   Brief Narrative:  Heather Hanson  is a 57 year old female with medical history significant for hyperlipidemia, GERD, hypothyroidism, depression and anxiety who presented to the emergency department complaining of left upper arm pain and swelling.   ED evaluation revealed left upper arm swelling and venous Doppler showed Left arm occlusive subclavian DVT and innominate vein nonocclusive DVT and Left basilic superficial venous thrombosis.   Subjective: Ongoing bloody urine with pain with voiding.  ROS: no complaints of nausea, vomiting, constipation diarrhea, cough, dyspnea.  Assessment & Plan:   Principal Problem:   Acute deep vein thrombosis (DVT) of left upper extremity  - note venous duplex report below - etiology uncertain - will need outpt hypercoagulable work up and hematology follow up - s/p angiojet thrombectomy on 2/21 with good results - she does have remaining clot and needs to continue anticoagulation - currently on a heparin infusion being managed by vascular surgery- spoke with Dr Arbie Cookey  - he states its reasonable to switch to oral anticoagulation - Cr too high for Xarelto-  Has been started on Eliquis   Active Problems: AKI -  Contrast induced nephropathy - appreciate management per nephrology- s/p dialysis x 2 -  - Cr dropping daily and no further needs for dialysis- temp cath removed on 3/8  Gross Hematuria with passage of blood clots - unfortunately, unable to stop Heparin infusion due DVT - urology feels that as long as she is voiding, she does not need any further intervention   - has no urinary retention but voiding is painful- Urology following intermittently  Anemia of chronic disease and acute blood loss - Hb 12.1 on admission- noted to steadily drop to 7.7 on 3/6 - transfused 1 U PRBC on 3/7- cont to follow Hb  - anemia panel shows  adequate stores of Iron  Mild hyperkalemia 3/8 -improving  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 Positive for deep venous thrombosis involving the paired brachial veins in the subclavian vein with thrombus extending into the innominate vein. The brachial venous DVT is occlusive while the subclavian and innominate DVT is nonocclusive. 2. Positive for superficial venous thrombosis involving the left basilic vein.  TPA  Dialysis cath Antimicrobials:  Anti-infectives (From admission, onward)   Start     Dose/Rate Route Frequency Ordered Stop   02/02/18 1200  fluconazole (DIFLUCAN) tablet 100 mg  Status:  Discontinued     100 mg Oral Daily 02/02/18 1155 02/07/18 0913   01/30/18 1515  clindamycin (CLEOCIN) IVPB 900 mg     900 mg 100 mL/hr over 30 Minutes Intravenous  Once 01/30/18 1510 01/30/18 1608       Objective: Vitals:   02/17/18 0741 02/17/18 1354 02/17/18 2000 02/18/18 0626  BP: 125/79 105/85 124/80 135/82  Pulse: 92  86   Resp:      Temp:  98.3 F (36.8 C) 98.1 F (36.7 C) 98.5 F (36.9 C)  TempSrc:  Oral Oral  Oral  SpO2:  97% 97% 100%  Weight:      Height:       No intake or output data in the 24 hours ending 02/18/18 0725 Filed Weights   02/12/18 0900 02/13/18 0518 02/16/18 0218  Weight: 99.8 kg (220 lb) 104.1 kg (229 lb 9.6 oz) 101.7 kg (224 lb 4.8 oz)    Examination: General exam: Appears comfortable  HEENT: PERRLA, oral mucosa moist, no sclera icterus or thrush Respiratory system: Clear to auscultation. Respiratory effort  normal. Cardiovascular system: S1 & S2 heard, RRR.  No murmurs  Gastrointestinal system: Abdomen soft,  Mildly tender in suprapubic area, nondistended. Normal bowel sound. No organomegaly Central nervous system: Alert and oriented. No focal neurological deficits. Extremities: No cyanosis, clubbing or edema Skin: No rashes or ulcers Psychiatry:  Mood & affect appropriate.     Data Reviewed: I have personally reviewed following labs and imaging studies  CBC: Recent Labs  Lab 02/13/18 0330 02/14/18 0236 02/14/18 1716 02/15/18 0730 02/16/18 0305 02/18/18 0319  WBC 7.8 8.1  --  8.8 10.6* 9.2  HGB 8.1* 7.7* 9.2* 9.1* 9.5* 7.8*  HCT 26.2* 25.2* 29.5* 29.3* 30.8* 25.7*  MCV 69.9* 70.6*  --  74.2* 74.6* 74.9*  PLT 123* 154  --  186 226 257   Basic Metabolic Panel: Recent Labs  Lab 02/14/18 0236 02/15/18 0730 02/15/18 1703 02/16/18 0305 02/17/18 0428 02/18/18 0319  NA 139 141 140 139 140 139  K 4.6 5.3* 4.9 5.0 4.9 4.7  CL 103 104 105 103 106 104  CO2 23 22 22  19* 21* 22  GLUCOSE 95 88 107* 95 108* 102*  BUN 39* 40* 41* 43* 37* 38*  CREATININE 8.47* 7.53* 7.25* 6.94* 6.28* 5.57*  CALCIUM 8.4* 9.0 8.8* 9.0 9.3 9.0  PHOS 6.5* 6.5*  --  6.3* 6.1* 5.8*   GFR: Estimated Creatinine Clearance: 14 mL/min (A) (by C-G formula based on SCr of 5.57 mg/dL (H)). Liver Function Tests: Recent Labs  Lab 02/14/18 0236 02/15/18 0730 02/16/18 0305 02/17/18 0428 02/18/18 0319  ALBUMIN 3.3* 3.7 3.9 3.7 3.6   No results for input(s): LIPASE, AMYLASE in the last 168 hours. No results for input(s): AMMONIA in the last 168 hours. Coagulation Profile: No results for input(s): INR, PROTIME in the last 168 hours. Cardiac Enzymes: No results for input(s): CKTOTAL, CKMB, CKMBINDEX, TROPONINI in the last 168 hours. BNP (last 3 results) No results for input(s): PROBNP in the last 8760 hours. HbA1C: No results for input(s): HGBA1C in the last 72 hours. CBG: Recent Labs  Lab 02/14/18 0625  02/15/18 0624 02/16/18 0622 02/17/18 0601 02/18/18 0627  GLUCAP 72 80 90 98 84   Lipid Profile: No results for input(s): CHOL, HDL, LDLCALC, TRIG, CHOLHDL, LDLDIRECT in the last 72 hours. Thyroid Function Tests: No results for input(s): TSH, T4TOTAL, FREET4, T3FREE, THYROIDAB in the last 72 hours. Anemia Panel: No results for input(s): VITAMINB12, FOLATE, FERRITIN, TIBC, IRON, RETICCTPCT in the last 72 hours. Urine analysis:    Component Value Date/Time   COLORURINE RED (A) 02/14/2018 1048   APPEARANCEUR TURBID (A) 02/14/2018 1048   LABSPEC  02/14/2018 1048    TEST NOT REPORTED DUE TO COLOR INTERFERENCE OF URINE PIGMENT   LABSPEC 1.020 06/05/2006 0845   PHURINE  02/14/2018 1048    TEST NOT REPORTED DUE TO COLOR INTERFERENCE OF URINE PIGMENT   GLUCOSEU (A) 02/14/2018 1048    TEST NOT REPORTED DUE TO COLOR INTERFERENCE OF URINE PIGMENT   HGBUR (  A) 02/14/2018 1048    TEST NOT REPORTED DUE TO COLOR INTERFERENCE OF URINE PIGMENT   BILIRUBINUR (A) 02/14/2018 1048    TEST NOT REPORTED DUE TO COLOR INTERFERENCE OF URINE PIGMENT   BILIRUBINUR Negative 06/05/2006 0845   KETONESUR (A) 02/14/2018 1048    TEST NOT REPORTED DUE TO COLOR INTERFERENCE OF URINE PIGMENT   PROTEINUR (A) 02/14/2018 1048    TEST NOT REPORTED DUE TO COLOR INTERFERENCE OF URINE PIGMENT   UROBILINOGEN 0.2 09/30/2013 2344   NITRITE NEGATIVE 02/14/2018 1048   LEUKOCYTESUR NEGATIVE 02/14/2018 1048   LEUKOCYTESUR Negative 06/05/2006 0845   Sepsis Labs: @LABRCNTIP (procalcitonin:4,lacticidven:4) )No results found for this or any previous visit (from the past 240 hour(s)).       Radiology Studies: No results found.    Scheduled Meds: . amLODipine  5 mg Oral Daily  . apixaban  5 mg Oral BID  . ARIPiprazole  2 mg Oral Daily  . ezetimibe  10 mg Oral Daily  . famotidine  20 mg Oral Daily  . gabapentin  300 mg Oral QHS  . heparin  40 Units/kg Dialysis Once in dialysis  . levothyroxine  25 mcg Oral QAC  breakfast  . lidocaine  1 application Urethral Once  . polyethylene glycol  17 g Oral Daily  . senna-docusate  1 tablet Oral BID  . sodium chloride flush  3 mL Intravenous Q12H  . topiramate  100 mg Oral BID  . venlafaxine XR  75 mg Oral Q breakfast   Continuous Infusions: . sodium chloride    . sodium chloride    . sodium chloride    . sodium chloride    . sodium chloride    . sodium chloride       LOS: 18 days    Time spent in minutes: 30    Calvert Cantor, MD [facility] Pager: [email or web address] Password [facility] 02/18/2018, 7:25 AM

## 2018-02-18 NOTE — Progress Notes (Signed)
  Progress Note    02/18/2018 9:54 AM 18 Days Post-Op  Subjective:  Unhappy with her entire treatment team and wants to be discharged   Vitals:   02/17/18 2000 02/18/18 0626  BP: 124/80 135/82  Pulse: 86   Resp:    Temp: 98.1 F (36.7 C) 98.5 F (36.9 C)  SpO2: 97% 100%   Physical Exam: Lungs:  No inc respiratory effort Extremities: palpable L radial; no edema L versus R arm Abdomen:  Soft Neurologic: A&O  CBC    Component Value Date/Time   WBC 7.8 02/18/2018 0837   RBC 3.51 (L) 02/18/2018 0837   HGB 8.2 (L) 02/18/2018 0837   HGB 11.7 07/06/2014 1504   HGB 11.7 07/01/2009 0910   HCT 26.5 (L) 02/18/2018 0837   HCT 36.8 07/06/2014 1504   HCT 36.4 07/01/2009 0910   PLT 258 02/18/2018 0837   PLT 204 07/06/2014 1504   PLT 190 07/01/2009 0910   MCV 75.5 (L) 02/18/2018 0837   MCV 72 (L) 07/06/2014 1504   MCV 70.7 (L) 07/01/2009 0910   MCH 23.4 (L) 02/18/2018 0837   MCHC 30.9 02/18/2018 0837   RDW 18.9 (H) 02/18/2018 0837   RDW 14.8 07/06/2014 1504   RDW 15.0 (H) 07/01/2009 0910   LYMPHSABS 1.7 01/30/2018 1248   LYMPHSABS 1.4 07/06/2014 1504   LYMPHSABS 1.0 07/01/2009 0910   MONOABS 0.5 01/30/2018 1248   MONOABS 0.4 07/01/2009 0910   EOSABS 0.3 01/30/2018 1248   EOSABS 0.2 07/06/2014 1504   BASOSABS 0.0 01/30/2018 1248   BASOSABS 0.0 07/06/2014 1504   BASOSABS 0.0 07/01/2009 0910    BMET    Component Value Date/Time   NA 139 02/18/2018 0319   NA 143 06/09/2013 1118   K 4.7 02/18/2018 0319   K 3.7 06/09/2013 1118   CL 104 02/18/2018 0319   CL 108 06/09/2013 1118   CO2 22 02/18/2018 0319   CO2 27 06/09/2013 1118   GLUCOSE 102 (H) 02/18/2018 0319   GLUCOSE 97 06/09/2013 1118   BUN 38 (H) 02/18/2018 0319   BUN 12 06/09/2013 1118   CREATININE 5.57 (H) 02/18/2018 0319   CREATININE 1.09 03/09/2014 0745   CALCIUM 9.0 02/18/2018 0319   CALCIUM 9.6 06/09/2013 1118   GFRNONAA 8 (L) 02/18/2018 0319   GFRAA 9 (L) 02/18/2018 0319    INR    Component  Value Date/Time   INR 0.91 01/30/2018 1248    No intake or output data in the 24 hours ending 02/18/18 0954   Assessment/Plan:  57 y.o. female is s/p angiojet L arm and central DVT complicated by AKI 18 Days Post-Op   Continue Eliquis for residual clot burden Urology activity following for hematuria and bladder pain Ok for discharge from vascular standpoint  Heather RutterMatthew Declan Mier, PA-C [facility] [phone] 02/18/2018 9:54 AM

## 2018-02-18 NOTE — Progress Notes (Signed)
PROGRESS NOTE    Heather Hanson   ZOX:[id number]  DOB: 1961-09-08  DOA: 01/30/2018 PCP: Macy Mis, MD   Brief Narrative:  Heather Hanson  is a 57 year old female with medical history significant for hyperlipidemia, GERD, hypothyroidism, depression and anxiety who presented to the emergency department complaining of left upper arm pain and swelling.   ED evaluation revealed left upper arm swelling and venous Doppler showed Left arm occlusive subclavian DVT and innominate vein nonocclusive DVT and Left basilic superficial venous thrombosis.   Subjective: Ongoing bloody urine with pain with voiding. She does state that her urine is not as blood as before and there are not much clots anymore. She feels lightheaded when she walks today.  ROS: no complaints of nausea, vomiting, constipation diarrhea, cough, dyspnea.  Assessment & Plan:   Principal Problem:   Acute deep vein thrombosis (DVT) of left upper extremity  - note venous duplex report below - etiology uncertain - will need outpt hypercoagulable work up and hematology follow up - s/p angiojet thrombectomy on 2/21 with good results - she does have remaining clot and needs to continue anticoagulation - currently on a heparin infusion being managed by vascular surgery- spoke with Dr Arbie Cookey  - he states its reasonable to switch to oral anticoagulation - Cr too high for Xarelto-  Has been started on Eliquis  Active Problems: AKI -  Contrast induced nephropathy - appreciate management per nephrology- s/p dialysis x 2 -  - Cr dropping daily and no further needs for dialysis- temp cath removed on 3/8  Gross Hematuria with passage of blood clots - unfortunately, unable to stop Heparin infusion due DVT - urology feels that as long as she is voiding, she does not need any further intervention   - has no urinary retention but voiding is painful- Urology following intermittently - have asked RV to check post void residual today to  ensure she is not retaining- PVR is 15 and urine is pink today as opposed to dark red as it has been for days  Anemia of chronic disease and acute blood loss - Hb 12.1 on admission- noted to steadily drop to 7.7 on 3/6 - transfused 1 U PRBC on 3/7- cont to follow Hb  - anemia panel shows adequate stores of Iron - Hb now 8.2  Dizziness - no cause found- Hb is not low enough to be causing it- pulse ox on ambulation is normal and she was abe to ambulate down the hall today - does not appears clinically dehydrated - BP is normal- may be deconditioned- have asked RN to ambulate her a few more times today  Mild hyperkalemia 3/8 -improving  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 Positive for deep venous thrombosis involving the paired brachial veins in the subclavian vein with thrombus extending into the innominate vein. The brachial venous DVT is occlusive while the subclavian and innominate DVT is nonocclusive. 2. Positive for superficial venous thrombosis involving the left basilic vein.  TPA  Dialysis cath Antimicrobials:  Anti-infectives (From admission, onward)   Start  Dose/Rate Route Frequency Ordered Stop   02/02/18 1200  fluconazole (DIFLUCAN) tablet 100 mg  Status:  Discontinued     100 mg Oral Daily 02/02/18 1155 02/07/18 0913   01/30/18 1515  clindamycin (CLEOCIN) IVPB 900 mg     900 mg 100 mL/hr over 30 Minutes Intravenous  Once 01/30/18 1510 01/30/18 1608       Objective: Vitals:   02/17/18 0741 02/17/18 1354 02/17/18 2000 02/18/18  0626  BP: 125/79 105/85 124/80 135/82  Pulse: 92  86   Resp:      Temp:  98.3 F (36.8 C) 98.1 F (36.7 C) 98.5 F (36.9 C)  TempSrc:  Oral Oral Oral  SpO2:  97% 97% 100%  Weight:      Height:        Intake/Output Summary (Last 24 hours) at 02/18/2018 1410 Last data filed at 02/18/2018 0955 Gross per 24 hour  Intake 240 ml  Output -  Net 240 ml   Filed Weights   02/12/18 0900 02/13/18 0518 02/16/18 0218  Weight: 99.8 kg (220 lb) 104.1 kg (229 lb 9.6 oz) 101.7 kg (224 lb 4.8 oz)    Examination: General exam: Appears comfortable  HEENT: PERRLA, oral mucosa moist, no sclera icterus or thrush Respiratory system: Clear to auscultation. Respiratory effort normal. Cardiovascular system: S1 & S2 heard, RRR.  No murmurs  Gastrointestinal system: Abdomen soft, nondistended. Normal bowel sound. No organomegaly Central nervous system: Alert and oriented. No focal neurological deficits. Extremities: No cyanosis, clubbing or edema Skin: No rashes or ulcers Psychiatry:  Mood & affect appropriate.     Data Reviewed: I have personally reviewed following labs and imaging studies  CBC: Recent Labs  Lab 02/14/18 0236 02/14/18 1716 02/15/18 0730 02/16/18 0305 02/18/18 0319 02/18/18 0837  WBC 8.1  --  8.8 10.6* 9.2 7.8  HGB 7.7* 9.2* 9.1* 9.5* 7.8* 8.2*  HCT 25.2* 29.5* 29.3* 30.8* 25.7* 26.5*  MCV 70.6*  --  74.2* 74.6* 74.9* 75.5*  PLT 154  --  186 226 257 258   Basic Metabolic Panel: Recent Labs  Lab 02/14/18 0236 02/15/18 0730 02/15/18 1703 02/16/18 0305 02/17/18 0428 02/18/18 0319  NA 139 141 140 139 140 139  K 4.6 5.3* 4.9 5.0 4.9 4.7  CL 103 104 105 103 106 104  CO2 23 22 22  19* 21* 22  GLUCOSE 95 88 107* 95 108* 102*  BUN 39* 40* 41* 43* 37* 38*  CREATININE 8.47* 7.53* 7.25* 6.94* 6.28* 5.57*  CALCIUM 8.4* 9.0 8.8* 9.0 9.3 9.0  PHOS 6.5* 6.5*  --  6.3* 6.1* 5.8*   GFR: Estimated Creatinine Clearance: 14 mL/min (A) (by C-G formula based on SCr of 5.57 mg/dL  (H)). Liver Function Tests: Recent Labs  Lab 02/14/18 0236 02/15/18 0730 02/16/18 0305 02/17/18 0428 02/18/18 0319  ALBUMIN 3.3* 3.7 3.9 3.7 3.6   No results for input(s): LIPASE, AMYLASE in the last 168 hours. No results for input(s): AMMONIA in the last 168 hours. Coagulation Profile: No results for input(s): INR, PROTIME in the last 168 hours. Cardiac Enzymes: No results for input(s): CKTOTAL, CKMB, CKMBINDEX, TROPONINI in the last 168 hours. BNP (last 3 results) No results for input(s): PROBNP in the last 8760 hours. HbA1C: No results for input(s): HGBA1C in the last 72 hours. CBG: Recent Labs  Lab 02/14/18 0625 02/15/18 0624 02/16/18 0622 02/17/18 0601 02/18/18 0627  GLUCAP 72 80 90 98 84   Lipid Profile: No results for input(s): CHOL, HDL, LDLCALC, TRIG,  CHOLHDL, LDLDIRECT in the last 72 hours. Thyroid Function Tests: No results for input(s): TSH, T4TOTAL, FREET4, T3FREE, THYROIDAB in the last 72 hours. Anemia Panel: No results for input(s): VITAMINB12, FOLATE, FERRITIN, TIBC, IRON, RETICCTPCT in the last 72 hours. Urine analysis:    Component Value Date/Time   COLORURINE RED (A) 02/14/2018 1048   APPEARANCEUR TURBID (A) 02/14/2018 1048   LABSPEC  02/14/2018 1048    TEST NOT REPORTED DUE TO COLOR INTERFERENCE OF URINE PIGMENT   LABSPEC 1.020 06/05/2006 0845   PHURINE  02/14/2018 1048    TEST NOT REPORTED DUE TO COLOR INTERFERENCE OF URINE PIGMENT   GLUCOSEU (A) 02/14/2018 1048    TEST NOT REPORTED DUE TO COLOR INTERFERENCE OF URINE PIGMENT   HGBUR (A) 02/14/2018 1048    TEST NOT REPORTED DUE TO COLOR INTERFERENCE OF URINE PIGMENT   BILIRUBINUR (A) 02/14/2018 1048    TEST NOT REPORTED DUE TO COLOR INTERFERENCE OF URINE PIGMENT   BILIRUBINUR Negative 06/05/2006 0845   KETONESUR (A) 02/14/2018 1048    TEST NOT REPORTED DUE TO COLOR INTERFERENCE OF URINE PIGMENT   PROTEINUR (A) 02/14/2018 1048    TEST NOT REPORTED DUE TO COLOR INTERFERENCE OF URINE PIGMENT     UROBILINOGEN 0.2 09/30/2013 2344   NITRITE NEGATIVE 02/14/2018 1048   LEUKOCYTESUR NEGATIVE 02/14/2018 1048   LEUKOCYTESUR Negative 06/05/2006 0845   Sepsis Labs: @LABRCNTIP (procalcitonin:4,lacticidven:4) )No results found for this or any previous visit (from the past 240 hour(s)).       Radiology Studies: No results found.    Scheduled Meds: . amLODipine  5 mg Oral Daily  . apixaban  5 mg Oral BID  . ARIPiprazole  2 mg Oral Daily  . ezetimibe  10 mg Oral Daily  . famotidine  20 mg Oral Daily  . gabapentin  300 mg Oral QHS  . heparin  40 Units/kg Dialysis Once in dialysis  . levothyroxine  25 mcg Oral QAC breakfast  . lidocaine  1 application Urethral Once  . polyethylene glycol  17 g Oral Daily  . senna-docusate  1 tablet Oral BID  . sodium chloride flush  3 mL Intravenous Q12H  . topiramate  100 mg Oral BID  . venlafaxine XR  75 mg Oral Q breakfast   Continuous Infusions: . sodium chloride    . sodium chloride    . sodium chloride    . sodium chloride    . sodium chloride    . sodium chloride       LOS: 18 days    Time spent in minutes: 30    Calvert CantorSaima Chyrel Taha, MD [facility] Pager: [email or web address] Password [email or web address] 02/18/2018, 2:10 PM

## 2018-02-18 NOTE — Plan of Care (Signed)
  Progressing Health Behavior/Discharge Planning: Ability to manage health-related needs will improve 02/18/2018 1536 - Progressing by Ree ShayGreene, Bitania Shankland N, RN Clinical Measurements: Will remain free from infection 02/18/2018 1536 - Progressing by Ree ShayGreene, Masiyah Engen N, RN Note Pt has shown no new signs of infection during my care.  Pain Managment: General experience of comfort will improve 02/18/2018 1536 - Progressing by Ree ShayGreene, Stephaun Million N, RN Note Pt has complained of no pain during my care.

## 2018-02-19 LAB — CBC
HCT: 27.6 % — ABNORMAL LOW (ref 36.0–46.0)
Hemoglobin: 8.5 g/dL — ABNORMAL LOW (ref 12.0–15.0)
MCH: 23 pg — ABNORMAL LOW (ref 26.0–34.0)
MCHC: 30.8 g/dL (ref 30.0–36.0)
MCV: 74.6 fL — ABNORMAL LOW (ref 78.0–100.0)
Platelets: 324 K/uL (ref 150–400)
RBC: 3.7 MIL/uL — ABNORMAL LOW (ref 3.87–5.11)
RDW: 18.3 % — ABNORMAL HIGH (ref 11.5–15.5)
WBC: 12 K/uL — ABNORMAL HIGH (ref 4.0–10.5)

## 2018-02-19 LAB — RENAL FUNCTION PANEL
Albumin: 3.9 g/dL (ref 3.5–5.0)
Anion gap: 12 (ref 5–15)
BUN: 36 mg/dL — AB (ref 6–20)
CALCIUM: 9.1 mg/dL (ref 8.9–10.3)
CHLORIDE: 106 mmol/L (ref 101–111)
CO2: 20 mmol/L — AB (ref 22–32)
Creatinine, Ser: 4.96 mg/dL — ABNORMAL HIGH (ref 0.44–1.00)
GFR calc non Af Amer: 9 mL/min — ABNORMAL LOW (ref >60)
GFR, EST AFRICAN AMERICAN: 10 mL/min — AB (ref >60)
Glucose, Bld: 121 mg/dL — ABNORMAL HIGH (ref 65–99)
Phosphorus: 5 mg/dL — ABNORMAL HIGH (ref 2.5–4.6)
Potassium: 3.9 mmol/L (ref 3.5–5.1)
SODIUM: 138 mmol/L (ref 135–145)

## 2018-02-19 LAB — URINALYSIS, ROUTINE W REFLEX MICROSCOPIC
Bilirubin Urine: NEGATIVE
Glucose, UA: NEGATIVE mg/dL
Ketones, ur: NEGATIVE mg/dL
Nitrite: NEGATIVE
Protein, ur: NEGATIVE mg/dL
Specific Gravity, Urine: 1.015 (ref 1.005–1.030)
pH: 6 (ref 5.0–8.0)

## 2018-02-19 LAB — GLUCOSE, CAPILLARY: GLUCOSE-CAPILLARY: 117 mg/dL — AB (ref 65–99)

## 2018-02-19 LAB — URINALYSIS, MICROSCOPIC (REFLEX)

## 2018-02-19 MED ORDER — APIXABAN 5 MG PO TABS: 5.0000 mg | ORAL_TABLET | Freq: Two times a day (BID) | ORAL | 0 refills | Status: DC

## 2018-02-19 MED ORDER — GABAPENTIN 300 MG PO CAPS: 300.0000 mg | ORAL_CAPSULE | Freq: Every day | ORAL | 0 refills | Status: DC

## 2018-02-19 MED FILL — ELIQUIS 5 MG TABLET: 5 | 30 days supply | Qty: 60 | Fill #0

## 2018-02-19 MED FILL — GABAPENTIN 300 MG CAPSULE: 300 | 30 days supply | Qty: 30 | Fill #0

## 2018-02-19 NOTE — Progress Notes (Signed)
Pt reviewed discharge summary and medications reviewed.  Discharge summary signed.  Belongings collected by pt.  Eliquis 22:00 dose give-ok per pharmacy d/t pt pharmacy already closed at discharge and unable to get medication.  No IV inplace.   Pt assisted to private vehicle by staff w.o difficulty.    Heather HoesMichelle Riddick Nuon RN

## 2018-02-19 NOTE — Discharge Summary (Signed)
Physician Discharge Summary  Heather Hanson ZOX:[id number] DOB: 27-Feb-1961 DOA: 01/30/2018  PCP: Macy Mis, MD  Admit date: 01/30/2018 Discharge date: 02/19/2018  Admitted From: home Disposition:  home   Recommendations for Outpatient Follow-up:  1. F/u on hematuria 2. Need outpt hematology referral 3. Repeat BMet in 1 wk 4. F/u Hb in 3-4 wks  Home Health:  ordered     Discharge Condition:  stable   CODE STATUS:  Full code   Consultations:  Vascular surgery  Nephrology    Discharge Diagnoses:  Principal Problem:   Acute deep vein thrombosis (DVT) of left upper extremity (HCC) Active Problems:   AKI (acute kidney injury) (HCC)   Gross hematuria   Hyperlipidemia, mixed   Obesity   Depression with anxiety   Seizure (HCC)   GERD (gastroesophageal reflux disease)   Hypothyroidism   Hypokalemia    Brief Summary: Heather Hanson is a 57 year old female with medical history significant for hyperlipidemia, GERD, hypothyroidism, depression and anxiety with a suicide attempt who presented to the emergency department complaining of left upper arm pain and swelling for about 4 days.  ED evaluation revealed left upper arm swelling and venous Doppler showed Left arm occlusive subclavian DVT and innominate vein nonocclusive DVT and Left basilic superficial venous thrombosis.    Hospital Course:  Principal Problem:   Acute deep vein thrombosis (DVT) of left upper extremity  - please note venous duplex report below - etiology uncertain and will need outpt hematology eval and hypercoagulable work up   - she underwent an angiojet thrombectomy on 2/21 by Dr Myra Gianotti with good results - she does have remaining clot and needs to continue anticoagulation - Has been started on Eliquis and is tolerating it well - Dr Myra Gianotti will arrange and outpt follow up with her  Active Problems: AKI -  Contrast induced nephropathy - appreciate management per nephrology- she presented  with a CR of 1.14 and Cr rose as high as 12.26 on 2/27 - s/p dialysis x 2 treatments-  - Cr dropping daily since second dialysis and no further needs for dialysis - see labs below  - temp cath removed on 3/8 - BUN 36 and Cr 4.96 today- would like PCP to recheck Bmet in about 1 wk to determine if it renal function has returned to baseline  Gross Hematuria with passage of blood clots - unfortunately, unable to stop Heparin infusion due DVT a - she continued to have severe hematuria with blood clots for a number of days and required a blood transfusion on 3/7 - urology feels it is related to above renal failure and feels that as long as she is voiding and emptying her bladder well, she does not need any further intervention   - has not had urinary retention (checked with bladder scans repeatedly) but voiding has been painful - Urology following intermittently - as of today , she no longer has gross hematuria - a UA reveals that she still has microscopic hematuria- no signs of a UTI - she complains of "pain in her bladder" but on exam, it appears that the pain is actually in the lower abdomen and specifically in her pannus- advised to apply heat at home - she is stable to be discharged from the hospital    Anemia of chronic disease and acute blood loss - Hb 12.1 on admission- noted to steadily drop to 7.7 on 3/6 - transfused 1 U PRBC on 3/7-   - anemia panel shows adequate  stores of Iron - Hb now 8.5  Dizziness - starting yesterday - no cause found- Hb is not low enough to be causing it- BP is stable - pulse ox on ambulation is normal and she was abe to ambulate down the hall   - does not appears clinically dehydrated  l- may be deconditioned- I asked RN to ambulate her a few more times yesterday and today prior to discharge  Mild hyperkalemia 3/8 -improving  Left breast Hematoma - due to TPA- stable   Chronic dysphagia - cont to follow -   Acute thrombocytopenia -  may have  been consumption due to acute clot and then due to hematuria - has normalized   Seizure disorder - cont Topamax  Depression and anxiety - had a [facility] after a suicide attempt  Discharge Exam: Vitals:   02/19/18 0601 02/19/18 1254  BP: 119/62 (!) 145/77  Pulse: 92 (!) 103  Resp: 18 18  Temp: 98.6 F (37 C) 98 F (36.7 C)  SpO2: 96% 98%   Vitals:   02/18/18 0626 02/18/18 2051 02/19/18 0601 02/19/18 1254  BP: 135/82 139/66 119/62 (!) 145/77  Pulse:  88 92 (!) 103  Resp:  18 18 18   Temp: 98.5 F (36.9 C) 98.1 F (36.7 C) 98.6 F (37 C) 98 F (36.7 C)  TempSrc: Oral Oral Oral Oral  SpO2: 100% 100% 96% 98%  Weight:   99 kg (218 lb 4.8 oz)   Height:        General: Pt is alert, awake, not in acute distress Cardiovascular: RRR, S1/S2 +, no rubs, no gallops Respiratory: CTA bilaterally, no wheezing, no rhonchi Abdominal: Soft, tenderness in abdominal pannus, bowel sounds + Extremities: no edema, no cyanosis   Discharge Instructions  Discharge Instructions    Diet - low sodium heart healthy   Complete by:  As directed    Increase activity slowly   Complete by:  As directed      Allergies as of 02/19/2018      Reactions   Aspirin Other (See Comments)   Per Dr   Augmentin [amoxicillin-pot Clavulanate] Other (See Comments)   Blisters in the back of throat   Dexilant [dexlansoprazole] Other (See Comments)   Bowel urgency and liquid stools   Hydrocodone-acetaminophen Hives   REACTION: Rash   Influenza Vaccines Swelling   Arm swelled to the size of a grapefruit   Levsin [hyoscyamine Sulfate] Other (See Comments)   Projectile vomitting   Linzess [linaclotide] Other (See Comments)   Bowel urgency and liquid stools   Promethazine Nausea And Vomiting   Propoxyphene N-acetaminophen Itching   REACTION: rash   Statins Other (See Comments)   myalgia   Acetaminophen Itching, Rash   Latex Itching, Rash   Rash-looks like  she has the measles per pt   Oxycodone-acetaminophen Itching, Rash   REACTION: Rash      Medication List    STOP taking these medications   cyclobenzaprine 10 MG tablet Commonly known as:  FLEXERIL   oxyCODONE 5 MG immediate release tablet Commonly known as:  Oxy IR/ROXICODONE     TAKE these medications   apixaban 5 MG Tabs tablet Commonly known as:  ELIQUIS Take 1 tablet (5 mg total) by mouth 2 (two) times daily.   ARIPiprazole 2 MG tablet Commonly known as:  ABILIFY Take 1 tablet (2 mg total) by mouth daily. For mood control   BENGAY ARTHRITIS FORMULA EX Apply 1  application topically as needed (neck and shoulder pain).   DULCOLAX PO Take 10 mg by mouth as needed (constipation).   gabapentin 300 MG capsule Commonly known as:  NEURONTIN Take 1 capsule (300 mg total) by mouth at bedtime. What changed:    how much to take  when to take this   hydrOXYzine 50 MG tablet Commonly known as:  ATARAX/VISTARIL Take 1 tablet (50 mg total) by mouth 3 (three) times daily as needed for anxiety.   levothyroxine 25 MCG tablet Commonly known as:  SYNTHROID, LEVOTHROID TAKE 1 TABLET BY MOUTH DAILY BEFORE BREAKFAST   mirtazapine 15 MG tablet Commonly known as:  REMERON Take 1 tablet (15 mg total) by mouth at bedtime. For mood control   topiramate 100 MG tablet Commonly known as:  TOPAMAX Take 1 tablet (100 mg total) by mouth 2 (two) times daily.   venlafaxine XR 75 MG 24 hr capsule Commonly known as:  EFFEXOR-XR Take 3 capsules (225 mg total) by mouth daily with breakfast.   ZETIA 10 MG tablet Generic drug:  ezetimibe Take 10 mg by mouth daily.       Allergies  Allergen Reactions  . Aspirin Other (See Comments)    Per Dr  . Augmentin [Amoxicillin-Pot Clavulanate] Other (See Comments)    Blisters in the back of throat  . Dexilant [Dexlansoprazole] Other (See Comments)    Bowel urgency and liquid stools  . Hydrocodone-Acetaminophen Hives    REACTION: Rash  .  Influenza Vaccines Swelling    Arm swelled to the size of a grapefruit  . Levsin [Hyoscyamine Sulfate] Other (See Comments)    Projectile vomitting  . Linzess [Linaclotide] Other (See Comments)    Bowel urgency and liquid stools  . Promethazine Nausea And Vomiting  . Propoxyphene N-Acetaminophen Itching    REACTION: rash  . Statins Other (See Comments)    myalgia  . Acetaminophen Itching and Rash  . Latex Itching and Rash    Rash-looks like she has the measles per pt  . Oxycodone-Acetaminophen Itching and Rash    REACTION: Rash     Procedures/Studies:  IMPRESSION: 1. Positive for deep venous thrombosis involving the paired brachial veins in the subclavian vein with thrombus extending into the innominate vein. The brachial venous DVT is occlusive while the subclavian and innominate DVT is nonocclusive. 2. Positive for superficial venous thrombosis involving the left basilic vein.  2/21- Thrombectomy   Dialysis cath    X-ray Chest Pa Or Ap  Result Date: 02/01/2018 CLINICAL DATA:  Recent thrombolysis and thrombectomy of left arm DVT. EXAM: CHEST 1 VIEW COMPARISON:  CT chest dated January 30, 2018. Chest x-ray dated October 27, 2013. FINDINGS: The heart size and mediastinal contours are within normal limits. Both lungs are clear. The visualized skeletal structures are unremarkable. Oral contrast in the stomach. IMPRESSION: No active disease. Electronically Signed   By: Obie Dredge M.D.   On: 02/01/2018 16:53   X-ray Cervical Spine Ap And Lateral  Result Date: 02/01/2018 CLINICAL DATA:  Concern for hardware left behind after neck surgery in 2017. EXAM: CERVICAL SPINE - 2-3 VIEW COMPARISON:  08/22/2017 radiographs and 06/08/2016 intraoperative radiographs FINDINGS: There is degenerative disc disease and spondylosis with disc space flattening from C3 through C6 with anterior osteophytes. No prevertebral soft tissue swelling is noted. The atlantodental interval is maintained.  There is slight straightening of the cervical curvature. The overlying airway is patent. Atherosclerotic calcification along the extracranial carotid arteries. No radiopaque foreign body is seen  radiographically. Cardiac monitoring devices are noted along the upper anterior chest with film marker noted anterior to the neck. There is a thin band along the surface of the skull base seen on the lateral view that may be related to patient clothing artifact or jewelry. IMPRESSION: Chronic degenerative disc disease and spondylosis. No acute osseous abnormality. No radiopaque foreign body is identified radiographically. If this persists to be of concern, cross-sectional imaging such as CT may be more sensitive. Electronically Signed   By: Tollie Ethavid  Kwon M.D.   On: 02/01/2018 16:58   Ct Angio Chest Pe W And/or Wo Contrast  Result Date: 01/30/2018 CLINICAL DATA:  Suspect pulmonary embolus. History of blood clot and left arm. EXAM: CT ANGIOGRAPHY CHEST WITH CONTRAST TECHNIQUE: Multidetector CT imaging of the chest was performed using the standard protocol during bolus administration of intravenous contrast. Multiplanar CT image reconstructions and MIPs were obtained to evaluate the vascular anatomy. CONTRAST:  100mL ISOVUE-370 IOPAMIDOL (ISOVUE-370) INJECTION 76% COMPARISON:  None. FINDINGS: Cardiovascular: The heart size appears within normal limits. No pericardial effusion visualized. Aortic atherosclerosis identified. The main pulmonary artery is patent. No saddle embolus or central obstructing emboli identified. No lobar or segmental pulmonary emboli identified. Mediastinum/Nodes: Normal appearance of the thyroid gland. The trachea appears patent and is midline. Normal appearance of the esophagus. No enlarged mediastinal or hilar lymph nodes. Lungs/Pleura: No pleural effusion. No pneumothorax, airspace consolidation or atelectasis. Pulmonary nodule in the right upper lobe measures 3 mm, image 41 of series 5. Upper  Abdomen: Cysts noted within left lobe of liver. No acute abnormality identified within the upper abdomen. Musculoskeletal: There is degenerative disc disease identified within the thoracic spine. No aggressive lytic or sclerotic bone lesions identified. Review of the MIP images confirms the above findings. IMPRESSION: 1. No evidence for acute pulmonary embolus. No active cardiopulmonary abnormalities noted. 2.  Aortic Atherosclerosis (ICD10-I70.0). 3. Right upper lobe pulmonary nodule measures 3 mm. No follow-up needed if patient is low-risk. Non-contrast chest CT can be considered in 12 months if patient is high-risk. This recommendation follows the consensus statement: Guidelines for Management of Incidental Pulmonary Nodules Detected on CT Images: From the Fleischner Society 2017; Radiology 2017; 284:228-243. Electronically Signed   By: Signa Kellaylor  Stroud M.D.   On: 01/30/2018 14:01   Dg Esophagus  Result Date: 02/01/2018 CLINICAL DATA:  Esophageal dysphagia.  Food getting stuck. EXAM: ESOPHOGRAM/BARIUM SWALLOW TECHNIQUE: Single contrast examination was performed using  thin barium. FLUOROSCOPY TIME:  Fluoroscopy Time:  2 minutes and 30 seconds Radiation Exposure Index (if provided by the fluoroscopic device): 77.9 mGy Number of Acquired Spot Images: 0 COMPARISON:  Chest CT 01/30/2018 FINDINGS: Initial barium swallows demonstrate normal pharyngeal motion with swallowing. No laryngeal penetration or aspiration. No upper esophageal webs, strictures or diverticuli. Mild impression on the upper esophagus by the thoracic aortic arch. No hiatal hernia was demonstrated. Distally there is a smooth strictured narrowing. No obvious mass. 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 The 13 mm barium pill would not pass through this area. No reflux was demonstrated. 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 The 13 mm barium pill would not pass through this area. Electronically Signed    By: Rudie MeyerP.  Gallerani M.D.   On: 02/01/2018 10:53   [place] Renal  Result Date: 02/02/2018 CLINICAL DATA:  57 year old female with acute renal insufficiency elevated creatinine. EXAM: RENAL / URINARY TRACT ULTRASOUND COMPLETE COMPARISON:  None. FINDINGS: Right Kidney: Length: 10.7 cm. The right kidney is  mildly echogenic. No hydronephrosis or shadowing stone Left Kidney: Length: 11.0 cm. The left kidney is mildly echogenic. No hydronephrosis or shadowing stone Bladder: Appears normal for degree of bladder distention. There is a 1.5 x 1.5 x 1.1 cm low attenuating lesion in the liver. IMPRESSION: 1. Mildly echogenic kidneys may represent underlying medical renal disease. Clinical correlation is recommended 2. No hydronephrosis or shadowing stone. Electronically Signed   By: Elgie Collard M.D.   On: 02/02/2018 23:02   US Venous Img Upper Uni Left  Result Date: 01/30/2018 CLINICAL DATA:  57 year old female with left upper extremity pain and swelling EXAM: LEFT UPPER EXTREMITY VENOUS DOPPLER ULTRASOUND TECHNIQUE: Gray-scale sonography with graded compression, as well as color Doppler and duplex ultrasound were performed to evaluate the upper extremity deep venous system from the level of the subclavian vein and including the jugular, axillary, basilic, radial, ulnar and upper cephalic vein. Spectral Doppler was utilized to evaluate flow at rest and with distal augmentation maneuvers. COMPARISON:  None. FINDINGS: Contralateral Subclavian Vein: Respiratory phasicity is normal and symmetric with the symptomatic side. No evidence of thrombus. Normal compressibility. Internal Jugular Vein: No evidence of thrombus. Normal compressibility, respiratory phasicity and response to augmentation. Subclavian Vein: Echogenic material present within the left innominate vein extending into the left subclavian vein. There is evidence of some flow on color Doppler imaging suggesting that this thrombus is nonocclusive. Axillary Vein: No  evidence of thrombus. Normal compressibility, respiratory phasicity and response to augmentation. Cephalic Vein: No evidence of thrombus. Normal compressibility, respiratory phasicity and response to augmentation. Basilic Vein: The basilic vein is not compressible. The lumen is filled with low-level internal echoes. No evidence of flow on color Doppler imaging. Brachial Veins: Noncompressible paired brachial veins. No evidence of flow on color Doppler imaging. Radial Veins: No evidence of thrombus. Normal compressibility, respiratory phasicity and response to augmentation. Ulnar Veins: No evidence of thrombus. Normal compressibility, respiratory phasicity and response to augmentation. Venous Reflux:  None visualized. Other Findings:  None visualized. IMPRESSION: 1. Positive for deep venous thrombosis involving the paired brachial veins in the subclavian vein with thrombus extending into the innominate vein. The brachial venous DVT is occlusive while the subclavian and innominate DVT is nonocclusive. 2. Positive for superficial venous thrombosis involving the left basilic vein. Signed, Sterling Big, MD [facility] Electronically Signed   By: Malachy Moan M.D.   On: 01/30/2018 12:58   Ir Fluoro Guide Cv Line Right  Result Date: 02/07/2018 INDICATION: Renal failure EXAM: NON TUNNELED DIALYSIS CATHETER RIGHT JUGULAR MEDICATIONS: None ANESTHESIA/SEDATION: None FLUOROSCOPY TIME:  Fluoroscopy Time:  minutes 6 seconds (1 mGy). COMPLICATIONS: None immediate. PROCEDURE: Informed written consent was obtained from the patient after a thorough discussion of the procedural risks, benefits and alternatives. All questions were addressed. Maximal Sterile Barrier Technique was utilized including caps, mask, sterile gowns, sterile gloves, sterile drape, hand hygiene and skin antiseptic. A timeout was performed prior to the initiation of the procedure. The right  neck was prepped and draped in a sterile fashion. 1% lidocaine was utilized for local anesthesia. Under sonographic guidance, a micropuncture needle was inserted into the right internal jugular vein and removed over a 018 wire which was up sized to a 3 J. the tract was dilated to 12 [place] and 12 [place] temporary dialysis catheter was advanced over the wire. It was flushed and sewn in place. FINDINGS: Fluoroscopic and ultrasound imaging confirms vascular access and placement of a dialysis catheter with  its tip at the cavoatrial junction. IMPRESSION: Successful placement of a non tunneled right jugular dialysis catheter with its tip at the cavoatrial junction. Electronically Signed   By: Jolaine Click M.D.   On: 02/07/2018 08:44   Ir US Guide Vasc Access Right  Result Date: 02/07/2018 INDICATION: Renal failure EXAM: NON TUNNELED DIALYSIS CATHETER RIGHT JUGULAR MEDICATIONS: None ANESTHESIA/SEDATION: None FLUOROSCOPY TIME:  Fluoroscopy Time:  minutes 6 seconds (1 mGy). COMPLICATIONS: None immediate. PROCEDURE: Informed written consent was obtained from the patient after a thorough discussion of the procedural risks, benefits and alternatives. All questions were addressed. Maximal Sterile Barrier Technique was utilized including caps, mask, sterile gowns, sterile gloves, sterile drape, hand hygiene and skin antiseptic. A timeout was performed prior to the initiation of the procedure. The right neck was prepped and draped in a sterile fashion. 1% lidocaine was utilized for local anesthesia. Under sonographic guidance, a micropuncture needle was inserted into the right internal jugular vein and removed over a 018 wire which was up sized to a 3 J. the tract was dilated to 12 [place] and 12 [place] temporary dialysis catheter was advanced over the wire. It was flushed and sewn in place. FINDINGS: Fluoroscopic and ultrasound imaging confirms vascular access and placement of a dialysis catheter with its tip at the cavoatrial  junction. IMPRESSION: Successful placement of a non tunneled right jugular dialysis catheter with its tip at the cavoatrial junction. Electronically Signed   By: Jolaine Click M.D.   On: 02/07/2018 08:44   US Breast Ltd Uni Left Inc Axilla  Result Date: 02/06/2018 CLINICAL DATA:  Patient was admitted 01/30/2018 for left upper extremity DVT for which she underwent thrombolysis and thrombectomy. She now has swelling involving the upper left breast. These images are just presented to me for interpretation today. EXAM: ULTRASOUND OF THE LEFT BREAST COMPARISON:  None. FINDINGS: At the 12 o'clock position of the left breast approximately 5 cm from nipple there is an oval parallel circumscribed nearly anechoic mass with scattered internal echoes measuring approximately 2.1 x 1.3 x 1.3 cm, demonstrating posterior acoustic enhancement and no internal color Doppler flow. There is no peripheral hyperemia on the color Doppler evaluation. IMPRESSION: Likely benign approximate 2 cm hematoma involving the upper left breast. Abscess is felt less likely in the absence of hyperemia. RECOMMENDATION: 1. I note the patient has not had a mammogram since 2014. Therefore, a diagnostic bilateral mammogram should obtained at the [facility] after discharge. 2. Follow-up left breast ultrasound will be performed at the time of the diagnostic mammogram to confirm resolution of the likely benign hematoma. BI-RADS CATEGORY  0: Incomplete. Need additional imaging evaluation and/or prior mammograms for comparison. Electronically Signed   By: Hulan Saas M.D.   On: 02/06/2018 13:38   Ct Renal Stone Study  Result Date: 02/12/2018 CLINICAL DATA:  Hematuria and right-sided abdominal pain x2 weeks. Rule out urolithiasis. EXAM: CT ABDOMEN AND PELVIS WITHOUT CONTRAST TECHNIQUE: Multidetector CT imaging of the abdomen and pelvis was performed following the standard protocol without IV contrast. COMPARISON:  07/16/2013 CT,  02/02/2018 renal ultrasound FINDINGS: Lower chest: Normal heart size without pericardial effusion. Hepatobiliary: Small subcentimeter hypodensities in the left hepatic lobe statistically consistent with cysts or hemangiomata, relatively stable since 2014 and consistent with benign findings. Status post cholecystectomy. No biliary dilatation. Pancreas: No ductal dilatation or inflammation. No mass is identified given limitations of a noncontrast study. Spleen: Small adjacent splenule.  Normal size spleen. Adrenals/Urinary Tract: Normal bilateral adrenal  glands. Punctate nonobstructing interpolar right renal calculus best seen on coronal reformatted image 57. No hydroureteronephrosis. Stable right-sided phleboliths are noted within the pelvis relative to previous CT from 2014. No bladder mass or calculus is apparent on this unenhanced study. Slightly thick-walled appearance of the bladder is likely due to underdistention. The possibility of a cystitis is not entirely excluded a can be correlated. Stomach/Bowel: Stomach is within normal limits. Appendix appears normal. A large amount of fecal retention is seen throughout the colon. No evidence of bowel wall thickening, distention, or inflammatory changes. Vascular/Lymphatic: Aortic atherosclerosis. No enlarged abdominal or pelvic lymph nodes. Reproductive: Status post hysterectomy. No adnexal masses. Other: No free air.  No abdominopelvic ascites. Musculoskeletal: Marked joint space narrowing of both hips. No acute nor suspicious osseous abnormalities. IMPRESSION: 1. Punctate right-sided nonobstructing interpolar renal calculus. No hydroureteronephrosis. 2. Slightly thick-walled appearance of the urinary bladder is likely due to underdistention. Cystitis is not excluded but believed less likely. 3. Small subcentimeter hypodensities in the left hepatic lobe dating back to 2014 consistent with benign findings, statistically more likely to represent hepatic cysts or  hemangiomata. 4. Increased fecal residue throughout the colon. Query constipation. Electronically Signed   By: Tollie Eth M.D.   On: 02/12/2018 17:14     The results of significant diagnostics from this hospitalization (including imaging, microbiology, ancillary and laboratory) are listed below for reference.     Microbiology: No results found for this or any previous visit (from the past 240 hour(s)).   Labs: BNP (last 3 results) Recent Labs    01/30/18 1248  BNP 18.7   Basic Metabolic Panel: Recent Labs  Lab 02/15/18 0730 02/15/18 1703 02/16/18 0305 02/17/18 0428 02/18/18 0319 02/19/18 0229  NA 141 140 139 140 139 138  K 5.3* 4.9 5.0 4.9 4.7 3.9  CL 104 105 103 106 104 106  CO2 22 22 19* 21* 22 20*  GLUCOSE 88 107* 95 108* 102* 121*  BUN 40* 41* 43* 37* 38* 36*  CREATININE 7.53* 7.25* 6.94* 6.28* 5.57* 4.96*  CALCIUM 9.0 8.8* 9.0 9.3 9.0 9.1  PHOS 6.5*  --  6.3* 6.1* 5.8* 5.0*   Liver Function Tests: Recent Labs  Lab 02/15/18 0730 02/16/18 0305 02/17/18 0428 02/18/18 0319 02/19/18 0229  ALBUMIN 3.7 3.9 3.7 3.6 3.9   No results for input(s): LIPASE, AMYLASE in the last 168 hours. No results for input(s): AMMONIA in the last 168 hours. CBC: Recent Labs  Lab 02/15/18 0730 02/16/18 0305 02/18/18 0319 02/18/18 0837 02/19/18 0229  WBC 8.8 10.6* 9.2 7.8 12.0*  HGB 9.1* 9.5* 7.8* 8.2* 8.5*  HCT 29.3* 30.8* 25.7* 26.5* 27.6*  MCV 74.2* 74.6* 74.9* 75.5* 74.6*  PLT 186 226 257 258 324   Cardiac Enzymes: No results for input(s): CKTOTAL, CKMB, CKMBINDEX, TROPONINI in the last 168 hours. BNP: Invalid input(s): POCBNP CBG: Recent Labs  Lab 02/15/18 0624 02/16/18 0622 02/17/18 0601 02/18/18 0627 02/19/18 0619  GLUCAP 80 90 98 84 117*   D-Dimer No results for input(s): DDIMER in the last 72 hours. Hgb A1c No results for input(s): HGBA1C in the last 72 hours. Lipid Profile No results for input(s): CHOL, HDL, LDLCALC, TRIG, CHOLHDL, LDLDIRECT in the  last 72 hours. Thyroid function studies No results for input(s): TSH, T4TOTAL, T3FREE, THYROIDAB in the last 72 hours.  Invalid input(s): FREET3 Anemia work up No results for input(s): VITAMINB12, FOLATE, FERRITIN, TIBC, IRON, RETICCTPCT in the last 72 hours. Urinalysis    Component Value Date/Time  COLORURINE YELLOW 02/19/2018 1240   APPEARANCEUR CLEAR 02/19/2018 1240   LABSPEC 1.015 02/19/2018 1240   LABSPEC 1.020 06/05/2006 0845   PHURINE 6.0 02/19/2018 1240   GLUCOSEU NEGATIVE 02/19/2018 1240   HGBUR LARGE (A) 02/19/2018 1240   BILIRUBINUR NEGATIVE 02/19/2018 1240   BILIRUBINUR Negative 06/05/2006 0845   KETONESUR NEGATIVE 02/19/2018 1240   PROTEINUR NEGATIVE 02/19/2018 1240   UROBILINOGEN 0.2 09/30/2013 2344   NITRITE NEGATIVE 02/19/2018 1240   LEUKOCYTESUR TRACE (A) 02/19/2018 1240   LEUKOCYTESUR Negative 06/05/2006 0845   Sepsis Labs Invalid input(s): PROCALCITONIN,  WBC,  LACTICIDVEN Microbiology No results found for this or any previous visit (from the past 240 hour(s)).   Time coordinating discharge: Over 30 minutes  SIGNED:   Calvert Cantor, MD  [facility] 02/19/2018, 2:21 PM Pager   If 7PM-7AM, please contact night-coverage [email or web address] Password [email or web address]

## 2018-02-19 NOTE — Progress Notes (Signed)
Pt is in much better spirits this morning.  Says she does not have anymore hematuria, but still hurts to void.    Renal function continues to improve.    Continue Eliquis.    Okay for discharge from vascular standpoint.

## 2018-02-19 NOTE — Care Management Note (Addendum)
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 Provided pt with Eliquis 30 day free trial card.   Offered choice for HH/list provided. Pt agreeable to [facility]. [facility].   PCP Delbert HarnessBriscoe, Kim MD  Expected Discharge Date:                 Expected Discharge Plan:  Home/Self Care  In-House Referral:  NA  Discharge planning Services  CM Consult, Medication Assistance  Post Acute Care Choice:  NA Choice offered to:  NA  DME Arranged:  N/A DME Agency:  NA  HH Arranged:  NA HH Agency:  NA  Status of Service:  Completed, signed off  If discussed at Long Length of Stay Meetings, dates discussed:    Additional Comments:  Elliot CousinShavis, Meaghan Whistler Ellen, RN 02/19/2018, 1:59 PM

## 2018-02-19 NOTE — Discharge Instructions (Signed)
Information on my medicine - ELIQUIS (apixaban)  This medication education was reviewed with me or my healthcare representative as part of my discharge preparation.   Why was Eliquis prescribed for you? Eliquis was prescribed to treat blood clots that may have been found in the veins of your legs (deep vein thrombosis) or in your lungs (pulmonary embolism) and to reduce the risk of them occurring again.  What do You need to know about Eliquis ? Take ONE 5 mg tablet taken TWICE daily.  Eliquis may be taken with or without food.   Try to take the dose about the same time in the morning and in the evening. If you have difficulty swallowing the tablet whole please discuss with your pharmacist how to take the medication safely.  Take Eliquis exactly as prescribed and DO NOT stop taking Eliquis without talking to the doctor who prescribed the medication.  Stopping may increase your risk of developing a new blood clot.  Refill your prescription before you run out.  After discharge, you should have regular check-up appointments with your healthcare provider that is prescribing your Eliquis.    What do you do if you miss a dose? If a dose of ELIQUIS is not taken at the scheduled time, take it as soon as possible on the same day and twice-daily administration should be resumed. The dose should not be doubled to make up for a missed dose.  Important Safety Information A possible side effect of Eliquis is bleeding. You should call your healthcare provider right away if you experience any of the following: ? Bleeding from an injury or your nose that does not stop. ? Unusual colored urine (red or dark brown) or unusual colored stools (red or black). ? Unusual bruising for unknown reasons. ? A serious fall or if you hit your head (even if there is no bleeding).  Some medicines may interact with Eliquis and might increase your risk of bleeding or clotting while on Eliquis. To help avoid this,  consult your healthcare provider or pharmacist prior to using any new prescription or non-prescription medications, including herbals, vitamins, non-steroidal anti-inflammatory drugs (NSAIDs) and supplements.  This website has more information on Eliquis (apixaban): http://www.eliquis.com/eliquis/home

## 2018-02-19 NOTE — Progress Notes (Signed)
Acute renal failure: Patient's creatinine is trending down.  Temporary dialysis catheter has been removed.  She continues to have good urine output  Hematuria: According to the patient this is better today however she still has difficulty with voiding.  Urinalysis will be checked later today.  Left arm DVT: Swelling is much better.  She will need to be on anticoagulation with an outpatient follow-up with hematology to determine the duration of anticoagulation therapy.  Okay to discharge from a vascular perspective.  I will schedule the patient for follow-up in my office in 1 month  Heather Hanson

## 2018-02-20 ENCOUNTER — Telehealth: Payer: Self-pay | Admitting: Surgery

## 2018-02-20 NOTE — Telephone Encounter (Signed)
Left pt vm re appt. Mailed letter. °

## 2018-02-20 NOTE — Telephone Encounter (Signed)
-----   Message from Sharee PimpleMarilyn K McChesney, RN sent at 02/19/2018  9:59 AM EDT ----- Regarding: 1 month with DVT scan   ----- Message ----- From: Nada LibmanBrabham, Vance W, MD Sent: 02/19/2018   9:45 AM To: Vvs Charge Pool  Schedule follow-up 1 month with a left arm venous ultrasound for DVT

## 2018-02-20 NOTE — Care Management Note (Signed)
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 Provided pt with Eliquis 30 day free trial card.   Offered choice for HH/list provided. [facility]able [facility]. [facility]ed [facility] with new referral.   PCP Delbert HarnessBriscoe, Kim MD  Expected Discharge Date:                 Expected Discharge Plan:  Home w Home Health Services  In-House Referral:  NA  Discharge planning Services  CM Consult, Medication Assistance  Post Acute Care Choice:  Home Health Choice offered to:  Patient  DME Arranged:  N/A DME Agency:  NA  HH Arranged:  RN, PT HH Agency:  [facility]  Status of Service:  Completed, signed off  If discussed at Long Length of Stay Meetings, dates discussed:    Additional Comments:  02/20/18- 1530- Donn PieriniKristi Vernee Baines RN, CM- [facility]- [facility] be [facility] to [facility] due to psych needs-(recent suicide attempt) have reached out to all other agencies and no one will accept referral at this time.   Darrold SpanWebster, Bellany Elbaum Hall, RN 02/20/2018, 3:39 PM

## 2018-03-13 MED FILL — POTASSIUM CL ER 20 MEQ TAB: 20 | 30 days supply | Qty: 30 | Fill #0

## 2018-03-14 MED FILL — oxyCODONE HCL 5 MG TABS: 5 | 30 days supply | Qty: 90 | Fill #0

## 2018-03-14 MED FILL — LEVOTHYROXINE 25 MCG TABLET: 25 | 30 days supply | Qty: 30 | Fill #0

## 2018-03-25 ENCOUNTER — Other Ambulatory Visit: Payer: Self-pay

## 2018-03-25 DIAGNOSIS — I82622 Acute embolism and thrombosis of deep veins of left upper extremity: Secondary | ICD-10-CM

## 2018-03-26 MED FILL — QUETIAPINE FUMARATE 25 MG T: 25 | 30 days supply | Qty: 30 | Fill #0

## 2018-03-26 MED FILL — BUTRANS 15 MCG/HR PATCH: 15 | 28 days supply | Qty: 4 | Fill #0

## 2018-04-01 MED FILL — ELIQUIS 5 MG TABLET: 5 | 30 days supply | Qty: 60 | Fill #0

## 2018-04-05 ENCOUNTER — Telehealth: Payer: Self-pay | Admitting: Hematology

## 2018-04-05 ENCOUNTER — Encounter: Payer: Self-pay | Admitting: Hematology

## 2018-04-05 NOTE — Telephone Encounter (Signed)
Appt has been scheduled for the pt to see Dr. Candise CheKale on 5/16 at 2pm. Pt aware to arrive 30 minutes early. Letter mailed to the pt with the appt information.

## 2018-04-15 ENCOUNTER — Ambulatory Visit (HOSPITAL_COMMUNITY)
Admission: RE | Admit: 2018-04-15 | Discharge: 2018-04-15 | Disposition: A | Discharge status: Home / Self Care | Payer: Medicaid Other | Source: Ambulatory Visit | Attending: Surgery | Admitting: Surgery

## 2018-04-15 ENCOUNTER — Other Ambulatory Visit: Payer: Self-pay

## 2018-04-15 ENCOUNTER — Ambulatory Visit (INDEPENDENT_AMBULATORY_CARE_PROVIDER_SITE_OTHER): Payer: Medicaid Other | Admitting: Surgery

## 2018-04-15 ENCOUNTER — Encounter: Payer: Self-pay | Admitting: Surgery

## 2018-04-15 VITALS — BP 141/81 | HR 67 | Temp 98.9°F | Resp 16 | Ht 68.0 in | Wt 228.0 lb

## 2018-04-15 DIAGNOSIS — I82622 Acute embolism and thrombosis of deep veins of left upper extremity: Secondary | ICD-10-CM | POA: Diagnosis not present

## 2018-04-15 NOTE — Progress Notes (Signed)
[facility] [place]  Patient name: Heather Hanson MRN: [id number] DOB: [birth year] Sex: female   REASON FOR VISIT:    Follow up DVT  HISOTRY OF PRESENT ILLNESS:    Heather Hanson is a 57 y.o. female who presented to the hospital on 01/31/2018 with a DVT involving the paired brachial veins in the subclavian vein extending into the innominate vein.  She underwent AngioJet thrombectomy of the left innominate vein and subclavian vein along with IV administration of TPA on 01/31/2018.  She had complete resolution of her clot.  Her postprocedure course was complicated by renal failure requiring 2 episodes of dialysis.  She also developed significant clots within her bladder.  She was ultimately able to be discharged from the hospital.  She is back for follow-up  The patient states that she is incontinent.  The hematoma in her left breast and left thigh have essentially resolved.  She does not have any swelling or pain in her left arm.   PAST MEDICAL HISTORY:   Past Medical History:  Diagnosis Date  . Adjustment disorder with mixed anxiety and depressed mood 09/28/2009   Qualifier: Diagnosis of  By: Nena Jordan   . Allergic state 04/09/2013  . Anemia    thalassemia  . Anxiety    takes Ativan daily prn  . Arthritis    sees Dr.Beekman  . Bursitis of right shoulder   . Cholecystitis, acute 08/30/2013  . Chronic insomnia   . Complication of anesthesia    hard to sedate  . Constipation    takes Linzess daily prn  . Depression    takes Effexor daily  . Difficulty swallowing solids   . DVT (deep venous thrombosis) (HCC) 01/31/2018   LUE  . Fibromyalgia   . GERD (gastroesophageal reflux disease) 06/17/2013   takes Dexilant daily prn  . H. pylori infection 07/19/2013  . H/O hiatal hernia   . Headache(784.0) 05/22/2013  . Hereditary and idiopathic peripheral neuropathy 03/14/2015  . History of bronchitis    last time many  yrs ago  . History of colonoscopy 2000  . Hyperlipidemia    not on any meds   . Hypoglycemia   . Hypothyroidism   . Lichen    [facility].Fleisher at [facility]  . Migraines    takes Topamax daily-last migraine today  . Muscle spasms of head and/or neck    takes Norflex daily prn  . Nausea    takes Phenergan prn  . Obesity   . Peripheral edema    takes Lasix prn  . PONV (postoperative nausea and vomiting)    with tonsil removal surgery  . PUD (peptic ulcer disease)    Dr Loreta Ave ( [facility])  . Seizure disorder (HCC) x 2   presumed secondary to ativan withdrawal -Dr Sharene Skeans  . Seizures (HCC)    X 2  . Shortness of breath    pt states when Fibromyalgia flares up  . Sinusitis, acute 04/09/2013   takes Zyrtec daily     FAMILY HISTORY:   Family History  Problem Relation Age of Onset  . Diabetes Mother        typer 2  . Heart disease Mother        PVD, PAD  . Cancer Father        prostate, lung, brain mets, smoker  . COPD Father   . Hyperlipidemia Father   . Hypertension Father   . Thyroid disease Father   .  Hypertension Sister   . Thyroid disease Sister   . Arthritis Sister   . Diabetes Son 16       type 1  . Diabetes Maternal Grandmother   . Stroke Maternal Grandfather   . Cancer Paternal Grandmother 10       breast  . Alcohol abuse Paternal Grandfather   . Hypertension Sister   . Obesity Sister   . Psoriasis Son   . Cancer Unknown        Breast < 68 yo, ,Prostate <50 yo  . Coronary artery disease Unknown        <60 yo  . Diabetes Unknown     SOCIAL HISTORY:   Social History   Tobacco Use  . Smoking status: Never Smoker  . Smokeless tobacco: Never Used  Substance Use Topics  . Alcohol use: No    Frequency: Never     ALLERGIES:   Allergies  Allergen Reactions  . Aspirin Other (See Comments)    Per Dr  . Augmentin [Amoxicillin-Pot Clavulanate] Other (See Comments)    Blisters in the back of throat  . Dexilant [Dexlansoprazole] Other (See Comments)     Bowel urgency and liquid stools  . Hydrocodone-Acetaminophen Hives    REACTION: Rash  . Influenza Vaccines Swelling    Arm swelled to the size of a grapefruit  . Levsin [Hyoscyamine Sulfate] Other (See Comments)    Projectile vomitting  . Linzess [Linaclotide] Other (See Comments)    Bowel urgency and liquid stools  . Promethazine Nausea And Vomiting  . Propoxyphene N-Acetaminophen Itching    REACTION: rash  . Statins Other (See Comments)    myalgia  . Acetaminophen Itching and Rash  . Latex Itching and Rash    Rash-looks like she has the measles per pt  . Oxycodone-Acetaminophen Itching and Rash    REACTION: Rash     CURRENT MEDICATIONS:   Current Outpatient Medications  Medication Sig Dispense Refill  . apixaban (ELIQUIS) 5 MG TABS tablet Take 1 tablet (5 mg total) by mouth 2 (two) times daily. 60 tablet 0  . Bisacodyl (DULCOLAX PO) Take 10 mg by mouth as needed (constipation).    . gabapentin (NEURONTIN) 300 MG capsule Take 1 capsule (300 mg total) by mouth at bedtime. 30 capsule 0  . hydrOXYzine (ATARAX/VISTARIL) 50 MG tablet Take 1 tablet (50 mg total) by mouth 3 (three) times daily as needed for anxiety. 30 tablet 0  . levothyroxine (SYNTHROID, LEVOTHROID) 25 MCG tablet TAKE 1 TABLET BY MOUTH DAILY BEFORE BREAKFAST 30 tablet 0  . topiramate (TOPAMAX) 100 MG tablet Take 1 tablet (100 mg total) by mouth 2 (two) times daily. 60 tablet 0  . venlafaxine XR (EFFEXOR-XR) 75 MG 24 hr capsule Take 3 capsules (225 mg total) by mouth daily with breakfast. 90 capsule 0  . ZETIA 10 MG tablet Take 10 mg by mouth daily.  2  . ARIPiprazole (ABILIFY) 2 MG tablet Take 1 tablet (2 mg total) by mouth daily. For mood control (Patient not taking: Reported on 04/15/2018) 30 tablet 0  . Menthol-Methyl Salicylate (BENGAY ARTHRITIS FORMULA EX) Apply 1 application topically as needed (neck and shoulder pain).    . mirtazapine (REMERON) 15 MG tablet Take 1 tablet (15 mg total) by mouth at bedtime. For  mood control (Patient not taking: Reported on 04/15/2018) 30 tablet 0   No current facility-administered medications for this visit.     REVIEW OF SYSTEMS:    denotes positive finding,   denotes negative finding Cardiac  Comments:  Chest pain or chest pressure:    Shortness of breath upon exertion:    Short of breath when lying flat:    Irregular heart rhythm:        Vascular    Pain in calf, thigh, or hip brought on by ambulation:    Pain in feet at night that wakes you up from your sleep:     Blood clot in your veins:    Leg swelling:         Pulmonary    Oxygen at home:    Productive cough:     Wheezing:         Neurologic    Sudden weakness in arms or legs:     Sudden numbness in arms or legs:     Sudden onset of difficulty speaking or slurred speech:    Temporary loss of vision in one eye:     Problems with dizziness:         Gastrointestinal    Blood in stool:     Vomited blood:         Genitourinary    Burning when urinating:     Blood in urine:        Psychiatric    Major depression:         Hematologic    Bleeding problems:    Problems with blood clotting too easily:        Skin    Rashes or ulcers:        Constitutional    Fever or chills:      PHYSICAL EXAM:   Vitals:   04/15/18 1607 04/15/18 1611  BP: (!) 149/86 (!) 141/81  Pulse: 67 67  Resp: 16   Temp: 98.9 F (37.2 C)   TempSrc: Oral   SpO2: 99%   Weight: 228 lb (103.4 kg)   Height:  (1.727 m)     GENERAL: The patient is a well-nourished female, in no acute distress. The vital signs are documented above. CARDIAC: There is a regular rate and rhythm.  VASCULAR: Palpable radial pulse on the left.  No significant hematoma in the left breast or left thigh.  The patient was examined in the presence of Clinton Gallant PULMONARY: Non-labored respirations MUSCULOSKELETAL: There are no major deformities or cyanosis. NEUROLOGIC: No focal weakness or paresthesias are detected. SKIN:  There are no ulcers or rashes noted. PSYCHIATRIC: The patient has a normal affect.  STUDIES:   I have ordered and reviewed her ultrasound today.  There is no evidence of obstruction in the right subclavian vein.  There is no evidence of obstruction in the central veins or arm veins or subclavian vein on the left  MEDICAL ISSUES:   Left upper extremity DVT: No residual evidence of thrombus was seen.  The patient has an appointment CT hematology next month to help determine the duration of anticoagulation given that this was an unprovoked DVT.  I have her scheduled for follow-up in 3 months.    Durene Cal, MD [facility] [phone] Pager [phone]

## 2018-04-18 MED FILL — POTASSIUM CL ER 20 MEQ TAB: 20 | 30 days supply | Qty: 30 | Fill #1

## 2018-04-18 MED FILL — LEVOTHYROXINE 25 MCG TABLET: 25 | 30 days supply | Qty: 30 | Fill #1

## 2018-04-23 MED FILL — BUTRANS 15 MCG/HR PATCH: 15 | 28 days supply | Qty: 4 | Fill #1

## 2018-04-23 MED FILL — QUETIAPINE 25 MG TABLET: 25 | 30 days supply | Qty: 30 | Fill #0

## 2018-04-24 MED FILL — BELBUCA 300 MCG FILM: 300 | 30 days supply | Qty: 60 | Fill #0

## 2018-04-25 ENCOUNTER — Inpatient Hospital Stay: Payer: Medicaid Other

## 2018-04-25 ENCOUNTER — Encounter: Payer: Self-pay | Admitting: Hematology

## 2018-04-25 ENCOUNTER — Inpatient Hospital Stay: Payer: Medicaid Other | Attending: Hematology | Admitting: Hematology

## 2018-04-25 VITALS — BP 148/92 | HR 89 | Temp 98.4°F | Resp 16 | Ht 68.0 in | Wt 226.1 lb

## 2018-04-25 DIAGNOSIS — R740 Nonspecific elevation of levels of transaminase and lactic acid dehydrogenase [LDH]: Secondary | ICD-10-CM | POA: Diagnosis not present

## 2018-04-25 DIAGNOSIS — D500 Iron deficiency anemia secondary to blood loss (chronic): Secondary | ICD-10-CM | POA: Insufficient documentation

## 2018-04-25 DIAGNOSIS — I82622 Acute embolism and thrombosis of deep veins of left upper extremity: Secondary | ICD-10-CM

## 2018-04-25 DIAGNOSIS — D6859 Other primary thrombophilia: Secondary | ICD-10-CM

## 2018-04-25 DIAGNOSIS — Z801 Family history of malignant neoplasm of trachea, bronchus and lung: Secondary | ICD-10-CM | POA: Insufficient documentation

## 2018-04-25 DIAGNOSIS — Z8042 Family history of malignant neoplasm of prostate: Secondary | ICD-10-CM | POA: Diagnosis not present

## 2018-04-25 DIAGNOSIS — Z808 Family history of malignant neoplasm of other organs or systems: Secondary | ICD-10-CM

## 2018-04-25 DIAGNOSIS — Z86718 Personal history of other venous thrombosis and embolism: Secondary | ICD-10-CM | POA: Insufficient documentation

## 2018-04-25 LAB — CMP (CANCER CENTER ONLY)
ALT: 11 U/L (ref 0–55)
AST: 18 U/L (ref 5–34)
Albumin: 4 g/dL (ref 3.5–5.0)
Alkaline Phosphatase: 89 U/L (ref 40–150)
Anion gap: 9 (ref 3–11)
BILIRUBIN TOTAL: 0.3 mg/dL (ref 0.2–1.2)
BUN: 12 mg/dL (ref 7–26)
CHLORIDE: 102 mmol/L (ref 98–109)
CO2: 29 mmol/L (ref 22–29)
Calcium: 9.3 mg/dL (ref 8.4–10.4)
Creatinine: 1.26 mg/dL — ABNORMAL HIGH (ref 0.60–1.10)
GFR, EST AFRICAN AMERICAN: 53 mL/min — AB (ref >60)
GFR, EST NON AFRICAN AMERICAN: 46 mL/min — AB (ref >60)
Glucose, Bld: 90 mg/dL (ref 70–140)
POTASSIUM: 3.5 mmol/L (ref 3.5–5.1)
Sodium: 140 mmol/L (ref 136–145)
TOTAL PROTEIN: 7.6 g/dL (ref 6.4–8.3)

## 2018-04-25 LAB — CBC WITH DIFFERENTIAL/PLATELET
BASOS ABS: 0 10*3/uL (ref 0.0–0.1)
Basophils Relative: 0 %
EOS ABS: 0.2 10*3/uL (ref 0.0–0.5)
Eosinophils Relative: 3 %
HEMATOCRIT: 35.7 % (ref 34.8–46.6)
Hemoglobin: 10.9 g/dL — ABNORMAL LOW (ref 11.6–15.9)
Lymphocytes Relative: 21 %
Lymphs Abs: 1.3 10*3/uL (ref 0.9–3.3)
MCH: 22.3 pg — ABNORMAL LOW (ref 25.1–34.0)
MCHC: 30.5 g/dL — ABNORMAL LOW (ref 31.5–36.0)
MCV: 73.2 fL — ABNORMAL LOW (ref 79.5–101.0)
MONO ABS: 0.4 10*3/uL (ref 0.1–0.9)
MONOS PCT: 6 %
Neutro Abs: 4.3 10*3/uL (ref 1.5–6.5)
Neutrophils Relative %: 70 %
Platelets: 271 10*3/uL (ref 145–400)
RBC: 4.88 MIL/uL (ref 3.70–5.45)
RDW: 14.3 % (ref 11.2–14.5)
WBC: 6.2 10*3/uL (ref 3.9–10.3)

## 2018-04-25 LAB — RETICULOCYTES
RBC.: 4.88 MIL/uL (ref 3.70–5.45)
RETIC COUNT ABSOLUTE: 78.1 10*3/uL (ref 33.7–90.7)
RETIC CT PCT: 1.6 % (ref 0.7–2.1)

## 2018-04-25 LAB — SEDIMENTATION RATE: Sed Rate: 36 mm/hr — ABNORMAL HIGH (ref 0–22)

## 2018-04-25 LAB — LACTATE DEHYDROGENASE: LDH: 297 U/L — AB (ref 125–245)

## 2018-04-25 LAB — ANTITHROMBIN III: ANTITHROMB III FUNC: 113 % (ref 75–120)

## 2018-04-25 NOTE — Progress Notes (Signed)
HEMATOLOGY/ONCOLOGY CONSULTATION NOTE  Date of Service: 04/25/18  Patient Care Team: Karle Plumber, MD as PCP - General (Internal Medicine)  CHIEF COMPLAINTS/PURPOSE OF CONSULTATION:  History of DVT of Left Upper Extremity  HISTORY OF PRESENTING ILLNESS:   Heather Hanson is a wonderful 57 y.o. female who has been referred to [place] by Dr Barney Drain for evaluation and management of History of DVT.   The pt reports that she had an acute DVT of her left upper extremity on 01/31/18 for which she underwent an angiojet thrombectomy and TPA. Her hospital stay was complicated by significant clots within her bladder and renal failure requiring 2 dialysis episodes. She was started on Eliquis while an inpatient at the time, and has since seen vascular surgeon Dr. Coral Else on 04/15/18, with a follow up in August.  This was her first clot. She denies ever having an in-dewlling catheter, central line or PICC lines.   She notes that her arm began swelling in February. She denies unusual or new activities, falls, blood being drawn, or any changes in her lifestyle or medications. She waited four days from the onset of her arm swelling before presenting to the ED. She denies having kidney problems before this and she is now seeing Dr. Camille Bal as her nephrologist and is seeing Dr Berneice Heinrich for incontinence in urology, which developed after a reaction to contrast.  She denies anemia problems in the past outside of pregnancy. She denies any concern for sickle cell disease in her family, and denies blood diseases as well.   She has two boys and was pregnant twice with no blood clots. She used PO contraceptive pills for a couple years without blood clots. She had cholecystectomy and a partial hysterectomy for fibroids.  She received steroid injections for disk disease in her neck, and has not recently taken any steroids. She also had a surgery to remove bone spurs from her neck in 2017 without any  blood clots.   She has continued to take Eliquis since February.   Most recent lab results (02/19/18) of CBC  is as follows: all values are WNL except for WBC at 12.0k, RBC at 3.70, Hgb at 8.5, HCT at 27.6, MCV at 74.6, MCH at 23.0, RDW at 18.3. Renal function panel 02/19/18 revealed BUN at 36, Creatinine at 4.96, Phosphorous at 5.0, GRF at 10.   On review of systems, pt reports resolved hematomas, resolved arm swelling, neck pain, back pain, and denies chest pain, shortness of breath, new painful or swollen joints, skin rashes, abdominal pains, blood in the stools, blood in the urine, nose bleeds, gum bleeds, and any other symptoms.   On PMHx the pt reports acute cholecystitis 2014, depression, DVT 2019, fibromyalgia 2014, GERD 2014, hereditary and idiopathic peripheral neuropathy 2016, hyperlipidemia, hypoglycemia, and two seizures. On Social Hx the pt reports never smoking or consuming ETOH, and denies IV drug use.  On Family Hx the pt reports maternal type 2 DM and heart disease, and paternal HTN, thyroid disease, and prostate, lung, brain mets cancer (smoker). She denies blood diseases or blood clots.    MEDICAL HISTORY:  Past Medical History:  Diagnosis Date  . Adjustment disorder with mixed anxiety and depressed mood 09/28/2009   Qualifier: Diagnosis of  By: Nena Jordan   . Allergic state 04/09/2013  . Anemia    thalassemia  . Anxiety    takes Ativan daily prn  . Arthritis    sees Dr.Beekman  .  Bursitis of right shoulder   . Cholecystitis, acute 08/30/2013  . Chronic insomnia   . Complication of anesthesia    hard to sedate  . Constipation    takes Linzess daily prn  . Depression    takes Effexor daily  . Difficulty swallowing solids   . DVT (deep venous thrombosis) (HCC) 01/31/2018   LUE  . Fibromyalgia   . GERD (gastroesophageal reflux disease) 06/17/2013   takes Dexilant daily prn  . H. pylori infection 07/19/2013  . H/O hiatal hernia   . Headache(784.0) 05/22/2013    . Hereditary and idiopathic peripheral neuropathy 03/14/2015  . History of bronchitis    last time many yrs ago  . History of colonoscopy 2000  . Hyperlipidemia    not on any meds   . Hypoglycemia   . Hypothyroidism   . Lichen    Dr[facility]Fleisher at [facility]  . Migraines    takes Topamax daily-last migraine today  . Muscle spasms of head and/or neck    takes Norflex daily prn  . Nausea    takes Phenergan prn  . Obesity   . Peripheral edema    takes Lasix prn  . PONV (postoperative nausea and vomiting)    with tonsil removal surgery  . PUD (peptic ulcer disease)    Dr Loreta Ave ( [facility])  . Seizure disorder (HCC) x 2   presumed secondary to ativan withdrawal -Dr Sharene Skeans  . Seizures (HCC)    X 2  . Shortness of breath    pt states when Fibromyalgia flares up  . Sinusitis, acute 04/09/2013   takes Zyrtec daily    SURGICAL HISTORY: Past Surgical History:  Procedure Laterality Date  . ABDOMINAL HYSTERECTOMY  2000   partial  . CHOLECYSTECTOMY N/A 09/16/2013   Procedure: LAPAROSCOPIC CHOLECYSTECTOMY;  Surgeon: Emelia Loron, MD;  Location: [facility] OR;  Service: General;  Laterality: N/A;  . COLONOSCOPY    . IR FLUORO GUIDE CV LINE RIGHT  02/06/2018  . IR US GUIDE VASC ACCESS RIGHT  02/06/2018  . PERIPHERAL VASCULAR THROMBECTOMY Left 01/31/2018   Procedure: PERIPHERAL VASCULAR THROMBECTOMY;  Surgeon: Fransisco Hertz, MD;  Location: [facility] INVASIVE CV LAB;  Service: Cardiovascular;  Laterality: Left;  . REMOVAL OF CERVICAL EXOSTOSIS N/A 06/08/2016   Procedure: REMOVAL OF  EXOSTOSIS ANTERIOR CERVICAL SPINE;  Surgeon: Venita Lick, MD;  Location: [facility] OR;  Service: Orthopedics;  Laterality: N/A;  . TONSILLECTOMY  1972    SOCIAL HISTORY: Social History   Socioeconomic History  . Marital status: Divorced    Spouse name: Not on file  . Number of children: 2  . Years of education: Not on file  . Highest education level: Not on file  Occupational History  . Occupation: LAB TECH    Employer:  [employer]    Comment: phlebotomist  Social Needs  . Financial resource strain: Not on file  . Food insecurity:    Worry: Not on file    Inability: Not on file  . Transportation needs:    Medical: Not on file    Non-medical: Not on file  Tobacco Use  . Smoking status: Never Smoker  . Smokeless tobacco: Never Used  Substance and Sexual Activity  . Alcohol use: Yes    Frequency: Never    Comment: rare use  . Drug use: No    Comment: Rare  . Sexual activity: Yes    Birth control/protection: Surgical    Comment: lives with,   Lifestyle  .  Physical activity:    Days per week: Not on file    Minutes per session: Not on file  . Stress: Not on file  Relationships  . Social connections:    Talks on phone: Not on file    Gets together: Not on file    Attends religious service: Not on file    Active member of club or organization: Not on file    Attends meetings of clubs or organizations: Not on file    Relationship status: Not on file  . Intimate partner violence:    Fear of current or ex partner: Not on file    Emotionally abused: Not on file    Physically abused: Not on file    Forced sexual activity: Not on file  Other Topics Concern  . Not on file  Social History Narrative   Occupation: Water quality scientist   Divorced     2 sons 26, 47      Never Smoked     Alcohol use-no       FAMILY HISTORY: Family History  Problem Relation Age of Onset  . Diabetes Mother        typer 2  . Heart disease Mother        PVD, PAD  . Cancer Father        prostate, lung, brain mets, smoker  . COPD Father   . Hyperlipidemia Father   . Hypertension Father   . Thyroid disease Father   . Hypertension Sister   . Thyroid disease Sister   . Arthritis Sister   . Diabetes Son 16       type 1  . Diabetes Maternal Grandmother   . Stroke Maternal Grandfather   . Cancer Paternal Grandmother 85       breast  . Alcohol abuse Paternal Grandfather   . Hypertension Sister   . Obesity  Sister   . Psoriasis Son   . Cancer Unknown        Breast < 60 yo, ,Prostate <50 yo  . Coronary artery disease Unknown        <60 yo  . Diabetes Unknown     ALLERGIES:  is allergic to aspirin; augmentin [amoxicillin-pot clavulanate]; dexilant [dexlansoprazole]; hydrocodone-acetaminophen; influenza vaccines; levsin [hyoscyamine sulfate]; linzess [linaclotide]; promethazine; propoxyphene n-acetaminophen; statins; acetaminophen; latex; and oxycodone-acetaminophen.  MEDICATIONS:  Current Outpatient Medications  Medication Sig Dispense Refill  . apixaban (ELIQUIS) 5 MG TABS tablet Take 1 tablet (5 mg total) by mouth 2 (two) times daily. 60 tablet 0  . Bisacodyl (DULCOLAX PO) Take 10 mg by mouth as needed (constipation).    . gabapentin (NEURONTIN) 300 MG capsule Take 1 capsule (300 mg total) by mouth at bedtime. 30 capsule 0  . hydrOXYzine (ATARAX/VISTARIL) 50 MG tablet Take 1 tablet (50 mg total) by mouth 3 (three) times daily as needed for anxiety. 30 tablet 0  . levothyroxine (SYNTHROID, LEVOTHROID) 25 MCG tablet TAKE 1 TABLET BY MOUTH DAILY BEFORE BREAKFAST 30 tablet 0  . topiramate (TOPAMAX) 100 MG tablet Take 1 tablet (100 mg total) by mouth 2 (two) times daily. 60 tablet 0  . venlafaxine XR (EFFEXOR-XR) 75 MG 24 hr capsule Take 3 capsules (225 mg total) by mouth daily with breakfast. 90 capsule 0  . ZETIA 10 MG tablet Take 10 mg by mouth daily.  2   No current facility-administered medications for this visit.     REVIEW OF SYSTEMS:    10 Point review of Systems was done is negative  except as noted above.  PHYSICAL EXAMINATION:  . Vitals:   04/25/18 1252  BP: (!) 148/92  Pulse: 89  Resp: 16  Temp: 98.4 F (36.9 C)  SpO2: 100%   Filed Weights   04/25/18 1252  Weight: 226 lb 1.6 oz (102.6 kg)   .Body mass index is 34.38 kg/m.  GENERAL:alert, in no acute distress and comfortable SKIN: no acute rashes, no significant lesions EYES: conjunctiva are pink and non-injected,  sclera anicteric OROPHARYNX: MMM, no exudates, no oropharyngeal erythema or ulceration NECK: supple, no JVD LYMPH:  no palpable lymphadenopathy in the cervical, axillary or inguinal regions LUNGS: clear to auscultation b/l with normal respiratory effort HEART: regular rate & rhythm ABDOMEN:  normoactive bowel sounds , non tender, not distended. Extremity: no pedal edema PSYCH: alert & oriented x 3 with fluent speech NEURO: no focal motor/sensory deficits  LABORATORY DATA:  I have reviewed the data as listed  . CBC Latest Ref Rng & Units 04/25/2018 02/19/2018 02/18/2018  WBC 3.9 - 10.3 K/uL 6.2 12.0(H) 7.8  Hemoglobin 11.6 - 15.9 g/dL 10.9(L) 8.5(L) 8.2(L)  Hematocrit 34.8 - 46.6 % 35.7 27.6(L) 26.5(L)  Platelets 145 - 400 K/uL 271 324 258    . CMP Latest Ref Rng & Units 04/25/2018 02/19/2018 02/18/2018  Glucose 70 - 140 mg/dL 90 161(W) 960(A)  BUN 7 - 26 mg/dL 12 54(U) 98(J)  Creatinine 0.60 - 1.10 mg/dL 1.91(Y) 7.82(N) 5.62(Z)  Sodium 136 - 145 mmol/L 140 138 139  Potassium 3.5 - 5.1 mmol/L 3.5 3.9 4.7  Chloride 98 - 109 mmol/L 102 106 104  CO2 22 - 29 mmol/L 29 20(L) 22  Calcium 8.4 - 10.4 mg/dL 9.3 9.1 9.0  Total Protein 6.4 - 8.3 g/dL 7.6 - -  Total Bilirubin 0.2 - 1.2 mg/dL 0.3 - -  Alkaline Phos 40 - 150 U/L 89 - -  AST 5 - 34 U/L 18 - -  ALT 0 - 55 U/L 11 - -    [place] ext venous 01/30/2018: IMPRESSION: 1. Positive for deep venous thrombosis involving the paired brachial veins in the subclavian vein with thrombus extending into the innominate vein. The brachial venous DVT is occlusive while the subclavian and innominate DVT is nonocclusive. 2. Positive for superficial venous thrombosis involving the left basilic vein.  CTA chest 01/30/2018: IMPRESSION: 1. No evidence for acute pulmonary embolus. No active cardiopulmonary abnormalities noted. 2.  Aortic Atherosclerosis (ICD10-I70.0). 3. Right upper lobe pulmonary nodule measures 3 mm. No follow-up needed if patient is  low-risk. Non-contrast chest CT can be considered in 12 months if patient is high-risk. This recommendation follows the consensus statement: Guidelines for Management of Incidental Pulmonary Nodules Detected on CT Images: From the Fleischner Society 2017; Radiology 2017; 284:228-243.   Electronically Signed   By: Signa Kell M.D.   On: 01/30/2018 14:01  CT abd: 02/12/2018 MPRESSION: 1. Punctate right-sided nonobstructing interpolar renal calculus. No hydroureteronephrosis. 2. Slightly thick-walled appearance of the urinary bladder is likely due to underdistention. Cystitis is not excluded but believed less likely. 3. Small subcentimeter hypodensities in the left hepatic lobe dating back to 2014 consistent with benign findings, statistically more likely to represent hepatic cysts or hemangiomata. 4. Increased fecal residue throughout the colon. Query constipation.   Electronically Signed   By: Tollie Eth M.D.    RADIOGRAPHIC STUDIES: I have personally reviewed the radiological images as listed and agreed with the findings in the report. No results found.  ASSESSMENT & PLAN:   57  y.o. female with   1. History of extensive DVT left upper extremity on 01/31/18.extending into innominate veins. Appears to have had superficial venous thrombosis involving the left basilic vein.  ? DVT caused by clot extension from superficial venous thrombosis ?related to IV line. Patient denies IV access in the area. ? Trigger or  Provoking factors.   2. S/p AKI creatinine 4.96 on 02/19/2018 Creatinine in now down to 1.26.   PLAN - -Will collect labs to rule out any obvious trigger factors or genetic predispositions -Recommend that pt continue with Eliqius for at least 6 months, and pending labs will likely recommend long term blood thinners.  -Recommend staying well hydrated, not crossing her legs, using compression stockings on long distance travels, minimizing ETOH and caffeine  consumption. -Recommend age appropriate and symptom directed cancer screening with PCP -would recommend f/u with vascular surgery to have them weight in regarding any local vascular compressive or other risk factors that contributed to extensive LUE DVT. -recent CTA chest in 01/2018 and subsequent CTA abd/pelvis in 02/2018 --given ont show any overt hostilities. -Will see pt back in 2 weeks with labs   3. Anemia due to bleeding s/p TPA. hgb up from 8.5---> 10.9. Plan -Iron replacement  4. Elevated LDH -- likely from DVT related tissue injury.no LNadenopathy clinically or on recent scan to  Suggest a lymphoproliferative disorder.  Labs today RTC with Dr Candise Che in 2 weeks    All of the patients questions were answered with apparent satisfaction. The patient knows to call the clinic with any problems, questions or concerns.  The toal time spent in the appt was 45 minutes and more than 50% was on counseling and direct patient cares.    Wyvonnia Lora MD MS AAHIVMS [facility] Hematology/Oncology Physician [facility]  (Office):       [phone] (Work cell):  [phone] (Fax):           [phone]  04/25/2018 1:38 PM  This document serves as a record of services personally performed by Wyvonnia Lora, MD. It was created on his behalf by Marcelline Mates, a trained medical scribe. The creation of this record is based on the scribe's personal observations and the provider's statements to them.   .I have reviewed the above documentation for accuracy and completeness, and I agree with the above. Johney Maine MD MS

## 2018-04-26 LAB — PROTEIN C ACTIVITY: PROTEIN C ACTIVITY: 143 % (ref 73–180)

## 2018-04-26 LAB — PROTEIN S ACTIVITY: Protein S Activity: 58 % — ABNORMAL LOW (ref 63–140)

## 2018-04-26 LAB — KAPPA/LAMBDA LIGHT CHAINS
Kappa free light chain: 29.1 mg/L — ABNORMAL HIGH (ref 3.3–19.4)
Kappa, lambda light chain ratio: 2.01 — ABNORMAL HIGH (ref 0.26–1.65)
LAMDA FREE LIGHT CHAINS: 14.5 mg/L (ref 5.7–26.3)

## 2018-04-26 LAB — LUPUS ANTICOAGULANT PANEL
DRVVT: 48.8 s — ABNORMAL HIGH (ref 0.0–47.0)
PTT Lupus Anticoagulant: 33 s (ref 0.0–51.9)

## 2018-04-26 LAB — DRVVT MIX: dRVVT Mix: 43.2 s (ref 0.0–47.0)

## 2018-04-30 LAB — MULTIPLE MYELOMA PANEL, SERUM
ALBUMIN/GLOB SERPL: 1.2 (ref 0.7–1.7)
Albumin SerPl Elph-Mcnc: 3.8 g/dL (ref 2.9–4.4)
Alpha 1: 0.2 g/dL (ref 0.0–0.4)
Alpha2 Glob SerPl Elph-Mcnc: 0.9 g/dL (ref 0.4–1.0)
B-Globulin SerPl Elph-Mcnc: 1.2 g/dL (ref 0.7–1.3)
Gamma Glob SerPl Elph-Mcnc: 0.9 g/dL (ref 0.4–1.8)
Globulin, Total: 3.2 g/dL (ref 2.2–3.9)
IGA: 168 mg/dL (ref 87–352)
IGM (IMMUNOGLOBULIN M), SRM: 83 mg/dL (ref 26–217)
IgG (Immunoglobin G), Serum: 1086 mg/dL (ref 700–1600)
Total Protein ELP: 7 g/dL (ref 6.0–8.5)

## 2018-04-30 LAB — CARDIOLIPIN ANTIBODIES, IGG, IGM, IGA: Anticardiolipin IgA: <9 APL U/mL (ref 0–11)

## 2018-04-30 LAB — BETA-2-GLYCOPROTEIN I ABS, IGG/M/A: Beta-2-Glycoprotein I IgA: <9 GPI IgA units (ref 0–25)

## 2018-05-01 LAB — FACTOR 5 LEIDEN

## 2018-05-02 LAB — PROTHROMBIN GENE MUTATION

## 2018-05-10 NOTE — Progress Notes (Signed)
HEMATOLOGY/ONCOLOGY CONSULTATION NOTE  Date of Service: 05/13/18  Patient Care Team: Karle Plumber, MD as PCP - General (Internal Medicine)  CHIEF COMPLAINTS/PURPOSE OF CONSULTATION:  History of DVT of Left Upper Extremity  HISTORY OF PRESENTING ILLNESS:   Heather Hanson is a wonderful 57 y.o. female who has been referred to [place] by Dr Barney Drain for evaluation and management of History of DVT.   The pt reports that she had an acute DVT of her left upper extremity on 01/31/18 for which she underwent an angiojet thrombectomy and TPA. Her hospital stay was complicated by significant clots within her bladder and renal failure requiring 2 dialysis episodes. She was started on Eliquis while an inpatient at the time, and has since seen vascular surgeon Dr. Coral Else on 04/15/18, with a follow up in August.  This was her first clot. She denies ever having an in-dewlling catheter, central line or PICC lines.   She notes that her arm began swelling in February. She denies unusual or new activities, falls, blood being drawn, or any changes in her lifestyle or medications. She waited four days from the onset of her arm swelling before presenting to the ED. She denies having kidney problems before this and she is now seeing Dr. Camille Bal as her nephrologist and is seeing Dr Berneice Heinrich for incontinence in urology, which developed after a reaction to contrast.  She denies anemia problems in the past outside of pregnancy. She denies any concern for sickle cell disease in her family, and denies blood diseases as well.   She has two boys and was pregnant twice with no blood clots. She used PO contraceptive pills for a couple years without blood clots. She had cholecystectomy and a partial hysterectomy for fibroids.  She received steroid injections for disk disease in her neck, and has not recently taken any steroids. She also had a surgery to remove bone spurs from her neck in 2017 without any  blood clots.   She has continued to take Eliquis since February.   Most recent lab results (02/19/18) of CBC  is as follows: all values are WNL except for WBC at 12.0k, RBC at 3.70, Hgb at 8.5, HCT at 27.6, MCV at 74.6, MCH at 23.0, RDW at 18.3. Renal function panel 02/19/18 revealed BUN at 36, Creatinine at 4.96, Phosphorous at 5.0, GRF at 10.   On review of systems, pt reports resolved hematomas, resolved arm swelling, neck pain, back pain, and denies chest pain, shortness of breath, new painful or swollen joints, skin rashes, abdominal pains, blood in the stools, blood in the urine, nose bleeds, gum bleeds, and any other symptoms.   On PMHx the pt reports acute cholecystitis 2014, depression, DVT 2019, fibromyalgia 2014, GERD 2014, hereditary and idiopathic peripheral neuropathy 2016, hyperlipidemia, hypoglycemia, and two seizures. On Social Hx the pt reports never smoking or consuming ETOH, and denies IV drug use.  On Family Hx the pt reports maternal type 2 DM and heart disease, and paternal HTN, thyroid disease, and prostate, lung, brain mets cancer (smoker). She denies blood diseases or blood clots.    Interval History:   Heather Hanson returns today regarding her acute DVT. The patient's last visit with [place] was on 04/25/18. She is accompanied today by her niece. The pt reports that she is doing well overall.   The pt reports that her fingers feel tight in both of her hands. She also notes significant anxiety about having a stroke and  notes that over the last 3 months she has lost 20 pounds, citing stress as the reason for this. She does not have any immediate follow ups with Dr Coral Else.   She notes heart palpitations when she lies down after taking her Eliquis, which goes away after 3 hours after taking Eliquis. She notes that it feels like her heart beats very hard every few beats. She denies chest pain, light headedness, or dizziness. She denies any A-fib or heart arrhythmias. She  has spoken with her cardiologist and notes that an EKG was unremarkable. She also notes that she doesn't drink much water.   Lab results (04/25/18) of CBC, CMP, and Reticulocytes is as follows: all values are WNL except for Hgb at 10.9, MCV at 73.2, MCH at 22.3, MCHC at 30.5, Creatinine at 1.26. MMP 04/25/18 is WNL Kappa/Lambda 05/13/18 showed Kappa at 29.1 and K:L ratio at 2.01 LDH 04/25/18 elevated at 297  Sed Rate 04/25/18 is elevated at 36 Factor V Leiden 04/25/18 was negative Prothrombin gene mutation 04/25/18 was negative Protein C activity 04/25/18 was WNL at 143 Protein S activity 04/25/18 was slightly low at 58 Antithrombin III 04/25/18 was WNL at 113.  Cardiolipin antibodies 04/25/18 was WNL Lupus anticoagulant panel showed DRVVT elevated at 48.8  On review of systems, pt reports occasional palpitations, weight loss of 20 pounds in last 3 months, intermittent neck pain, and denies dizziness, light headedness, bleeding problems, chest pain, and any other symptoms.    MEDICAL HISTORY:  Past Medical History:  Diagnosis Date  . Adjustment disorder with mixed anxiety and depressed mood 09/28/2009   Qualifier: Diagnosis of  By: Nena Jordan   . Allergic state 04/09/2013  . Anemia    thalassemia  . Anxiety    takes Ativan daily prn  . Arthritis    sees Dr.Beekman  . Bursitis of right shoulder   . Cholecystitis, acute 08/30/2013  . Chronic insomnia   . Complication of anesthesia    hard to sedate  . Constipation    takes Linzess daily prn  . Depression    takes Effexor daily  . Difficulty swallowing solids   . DVT (deep venous thrombosis) (HCC) 01/31/2018   LUE  . Fibromyalgia   . GERD (gastroesophageal reflux disease) 06/17/2013   takes Dexilant daily prn  . H. pylori infection 07/19/2013  . H/O hiatal hernia   . Headache(784.0) 05/22/2013  . Hereditary and idiopathic peripheral neuropathy 03/14/2015  . History of bronchitis    last time many yrs ago  . History of colonoscopy  2000  . Hyperlipidemia    not on any meds   . Hypoglycemia   . Hypothyroidism   . Lichen    Dr.Fleisher at [facility]  . Migraines    takes Topamax daily-last migraine today  . Muscle spasms of head and/or neck    takes Norflex daily prn  . Nausea    takes Phenergan prn  . Obesity   . Peripheral edema    takes Lasix prn  . PONV (postoperative nausea and vomiting)    with tonsil removal surgery  . PUD (peptic ulcer disease)    Dr Loreta Ave ( [facility])  . Seizure disorder (HCC) x 2   presumed secondary to ativan withdrawal -Dr Sharene Skeans  . Seizures (HCC)    X 2  . Shortness of breath    pt states when Fibromyalgia flares up  . Sinusitis, acute 04/09/2013   takes Zyrtec daily    SURGICAL  HISTORY: Past Surgical History:  Procedure Laterality Date  . ABDOMINAL HYSTERECTOMY  2000   partial  . CHOLECYSTECTOMY N/A 09/16/2013   Procedure: LAPAROSCOPIC CHOLECYSTECTOMY;  Surgeon: Emelia Loron, MD;  Location: [facility] OR;  Service: General;  Laterality: N/A;  . COLONOSCOPY    . IR FLUORO GUIDE CV LINE RIGHT  02/06/2018  . IR US GUIDE VASC ACCESS RIGHT  02/06/2018  . PERIPHERAL VASCULAR THROMBECTOMY Left 01/31/2018   Procedure: PERIPHERAL VASCULAR THROMBECTOMY;  Surgeon: Fransisco Hertz, MD;  Location: [facility] INV[facility]IVE [facility] LAB;  Service: Cardiovascular;  Laterality: Left;  . REMOVAL OF CERVICAL EXOSTOSIS N/A 06/08/2016   Procedure: REMOVAL OF  EXOSTOSIS ANTERIOR CERVICAL SPINE;  Surgeon: Venita Lick, MD;  Location: [facility] OR;  Service: Orthopedics;  Laterality: N/A;  . TONSILLECTOMY  1972    SOCIAL HISTORY: Social History   Socioeconomic History  . Marital status: Divorced    Spouse name: Not on file  . Number of children: 2  . Years of education: Not on file  . Highest education level: Not on file  Occupational History  . Occupation: LAB TECH    Employer: [employer]    Comment: phlebotomist  Social Needs  . Financial resource strain: Not on file  . Food insecurity:    Worry: Not on  file    Inability: Not on file  . Transportation needs:    Medical: Not on file    Non-medical: Not on file  Tobacco Use  . Smoking status: Never Smoker  . Smokeless tobacco: Never Used  Substance and Sexual Activity  . Alcohol use: Yes    Frequency: Never    Comment: rare use  . Drug use: No    Comment: Rare  . Sexual activity: Yes    Birth control/protection: Surgical    Comment: lives with,   Lifestyle  . Physical activity:    Days per week: Not on file    Minutes per session: Not on file  . Stress: Not on file  Relationships  . Social connections:    Talks on phone: Not on file    Gets together: Not on file    Attends religious service: Not on file    Active member of club or organization: Not on file    Attends meetings of clubs or organizations: Not on file    Relationship status: Not on file  . Intimate partner violence:    Fear of current or ex partner: Not on file    Emotionally abused: Not on file    Physically abused: Not on file    Forced sexual activity: Not on file  Other Topics Concern  . Not on file  Social History Narrative   Occupation: Water quality scientist   Divorced     2 sons 26, 91      Never Smoked     Alcohol use-no       FAMILY HISTORY: Family History  Problem Relation Age of Onset  . Diabetes Mother        typer 2  . Heart disease Mother        PVD, PAD  . Cancer Father        prostate, lung, brain mets, smoker  . COPD Father   . Hyperlipidemia Father   . Hypertension Father   . Thyroid disease Father   . Hypertension Sister   . Thyroid disease Sister   . Arthritis Sister   . Diabetes Son 28       type  1  . Diabetes Maternal Grandmother   . Stroke Maternal Grandfather   . Cancer Paternal Grandmother 51       breast  . Alcohol abuse Paternal Grandfather   . Hypertension Sister   . Obesity Sister   . Psoriasis Son   . Cancer Unknown        Breast < 61 yo, ,Prostate <50 yo  . Coronary artery disease Unknown        <60 yo  .  Diabetes Unknown     ALLERGIES:  is allergic to aspirin; augmentin [amoxicillin-pot clavulanate]; dexilant [dexlansoprazole]; hydrocodone-acetaminophen; influenza vaccines; levsin [hyoscyamine sulfate]; linzess [linaclotide]; promethazine; propoxyphene n-acetaminophen; statins; acetaminophen; latex; and oxycodone-acetaminophen.  MEDICATIONS:  Current Outpatient Medications  Medication Sig Dispense Refill  . apixaban (ELIQUIS) 5 MG TABS tablet Take 1 tablet (5 mg total) by mouth 2 (two) times daily. 60 tablet 0  . Bisacodyl (DULCOLAX PO) Take 10 mg by mouth as needed (constipation).    . gabapentin (NEURONTIN) 300 MG capsule Take 1 capsule (300 mg total) by mouth at bedtime. 30 capsule 0  . hydrOXYzine (ATARAX/VISTARIL) 50 MG tablet Take 1 tablet (50 mg total) by mouth 3 (three) times daily as needed for anxiety. 30 tablet 0  . levothyroxine (SYNTHROID, LEVOTHROID) 25 MCG tablet TAKE 1 TABLET BY MOUTH DAILY BEFORE BREAKFAST 30 tablet 0  . topiramate (TOPAMAX) 100 MG tablet Take 1 tablet (100 mg total) by mouth 2 (two) times daily. 60 tablet 0  . venlafaxine XR (EFFEXOR-XR) 75 MG 24 hr capsule Take 3 capsules (225 mg total) by mouth daily with breakfast. 90 capsule 0  . ZETIA 10 MG tablet Take 10 mg by mouth daily.  2   No current facility-administered medications for this visit.     REVIEW OF SYSTEMS:    A 10+ POINT REVIEW OF SYSTEMS WAS OBTAINED including neurology, dermatology, psychiatry, cardiac, respiratory, lymph, extremities, GI, GU, Musculoskeletal, constitutional, breasts, reproductive, HEENT.  All pertinent positives are noted in the HPI.  All others are negative.   PHYSICAL EXAMINATION:  . Vitals:   05/13/18 1419  BP: (!) 179/87  Pulse: 64  Resp: 17  Temp: 97.6 F (36.4 C)  SpO2: 100%   Filed Weights   05/13/18 1419  Weight: 227 lb 4.8 oz (103.1 kg)   .Body mass index is 34.56 kg/m.  GENERAL:alert, in no acute distress and comfortable SKIN: no acute rashes, no  significant lesions EYES: conjunctiva are pink and non-injected, sclera anicteric OROPHARYNX: MMM, no exudates, no oropharyngeal erythema or ulceration NECK: supple, no JVD LYMPH:  no palpable lymphadenopathy in the cervical, axillary or inguinal regions LUNGS: clear to auscultation b/l with normal respiratory effort HEART: regular rate & rhythm ABDOMEN:  normoactive bowel sounds , non tender, not distended. Extremity: no pedal edema PSYCH: alert & oriented x 3 with fluent speech NEURO: no focal motor/sensory deficits   LABORATORY DATA:  I have reviewed the data as listed  . CBC Latest Ref Rng & Units 04/25/2018 02/19/2018 02/18/2018  WBC 3.9 - 10.3 K/uL 6.2 12.0(H) 7.8  Hemoglobin 11.6 - 15.9 g/dL 10.9(L) 8.5(L) 8.2(L)  Hematocrit 34.8 - 46.6 % 35.7 27.6(L) 26.5(L)  Platelets 145 - 400 K/uL 271 324 258    . CMP Latest Ref Rng & Units 04/25/2018 02/19/2018 02/18/2018  Glucose 70 - 140 mg/dL 90 811(B) 147(W)  BUN 7 - 26 mg/dL 12 29(F) 62(Z)  Creatinine 0.60 - 1.10 mg/dL 3.08(M) 5.78(I) 6.96(E)  Sodium 136 - 145 mmol/L 140 138 139  Potassium 3.5 - 5.1 mmol/L 3.5 3.9 4.7  Chloride 98 - 109 mmol/L 102 106 104  CO2 22 - 29 mmol/L 29 20(L) 22  Calcium 8.4 - 10.4 mg/dL 9.3 9.1 9.0  Total Protein 6.4 - 8.3 g/dL 7.6 - -  Total Bilirubin 0.2 - 1.2 mg/dL 0.3 - -  Alkaline Phos 40 - 150 U/L 89 - -  AST 5 - 34 U/L 18 - -  ALT 0 - 55 U/L 11 - -   .\ Component     Latest Ref Rng & Units 04/25/2018  IgG (Immunoglobin G), Serum     700 - 1,600 mg/dL 5,784  IgA     87 - 696 mg/dL 295  IgM (Immunoglobulin M), Srm     26 - 217 mg/dL 83  Total Protein ELP     6.0 - 8.5 g/dL 7.0  Albumin SerPl Elph-Mcnc     2.9 - 4.4 g/dL 3.8  Alpha 1     0.0 - 0.4 g/dL 0.2  Alpha2 Glob SerPl Elph-Mcnc     0.4 - 1.0 g/dL 0.9  B-Globulin SerPl Elph-Mcnc     0.7 - 1.3 g/dL 1.2  Gamma Glob SerPl Elph-Mcnc     0.4 - 1.8 g/dL 0.9  M Protein SerPl Elph-Mcnc     Not Observed g/dL Not Observed  Globulin,  Total     2.2 - 3.9 g/dL 3.2  Albumin/Glob SerPl     0.7 - 1.7 1.2  IFE 1      Comment  Please Note (HCV):      Comment  Kappa free light chain     3.3 - 19.4 mg/L 29.1 (H)  Lamda free light chains     5.7 - 26.3 mg/L 14.5  Kappa, lamda light chain ratio     0.26 - 1.65 2.01 (H)  LDH     125 - 245 U/L 297 (H)  Sed Rate     0 - 22 mm/hr 36 (H)    [place] ext venous 01/30/2018: IMPRESSION: 1. Positive for deep venous thrombosis involving the paired brachial veins in the subclavian vein with thrombus extending into the innominate vein. The brachial venous DVT is occlusive while the subclavian and innominate DVT is nonocclusive. 2. Positive for superficial venous thrombosis involving the left basilic vein.  CTA chest 01/30/2018: IMPRESSION: 1. No evidence for acute pulmonary embolus. No active cardiopulmonary abnormalities noted. 2.  Aortic Atherosclerosis (ICD10-I70.0). 3. Right upper lobe pulmonary nodule measures 3 mm. No follow-up needed if patient is low-risk. Non-contrast chest CT can be considered in 12 months if patient is high-risk. This recommendation follows the consensus statement: Guidelines for Management of Incidental Pulmonary Nodules Detected on CT Images: From the Fleischner Society 2017; Radiology 2017; 284:228-243.   Electronically Signed   By: Signa Kell M.D.   On: 01/30/2018 14:01  CT abd: 02/12/2018 MPRESSION: 1. Punctate right-sided nonobstructing interpolar renal calculus. No hydroureteronephrosis. 2. Slightly thick-walled appearance of the urinary bladder is likely due to underdistention. Cystitis is not excluded but believed less likely. 3. Small subcentimeter hypodensities in the left hepatic lobe dating back to 2014 consistent with benign findings, statistically more likely to represent hepatic cysts or hemangiomata. 4. Increased fecal residue throughout the colon. Query constipation.   Electronically Signed   By: Tollie Eth  M.D.    RADIOGRAPHIC STUDIES: I have personally reviewed the radiological images as listed and agreed with the findings in the report. No results found.  ASSESSMENT & PLAN:  57 y.o. female with   1. History of extensive DVT left upper extremity on 01/31/18.extending into innominate veins. Appears to have had superficial venous thrombosis involving the left basilic vein.  ? DVT caused by clot extension from superficial venous thrombosis ?related to IV line. Patient denies IV access in the area. ? Trigger or  Provoking factors.   2. S/p AKI creatinine 4.96 on 02/19/2018 Creatinine in now down to 1.26.   PLAN -Recommend that pt continue with Eliqius for at least 6 months, and pending labs will likely recommend long term blood thinners.  -Recommend age appropriate and symptom directed cancer screening with PCP -would recommend f/u with vascular surgery to have them weight in regarding any local vascular compressive or other risk factors that contributed to extensive LUE DVT. -recent CTA chest in 01/2018 and subsequent CTA abd/pelvis in 02/2018 --given ont show any overt abnormalities -Discussed pt labwork, 04/25/18; blood counts and chemistries are stable.  -Factor V Leiden 04/25/18 was negative Prothrombin gene mutation 04/25/18 was negative Protein C activity 04/25/18 was WNL at 143 Protein S activity 04/25/18 was slightly low at 58 Antithrombin III 04/25/18 was WNL at 113.  Cardiolipin antibodies 04/25/18 was WNL  -Would like to monitor Protein S activity in 3 months before taking pt off of blood thinners -Avoid oral contraceptives or estrogen containing pills -Increase hydration -Compression socks on long travels, avoiding excess alcohol and caffeine as well -Discussed Virchow's triad of risk factors with pt and what we can do to lower her risk of blood clots  -Will see pt back in 3 months with Protein S activity  -Will order Left upper extremity US with next visit  3. Anemia due to  bleeding s/p TPA. hgb up from 8.5---> 10.9. Plan -Iron replacement  4. Elevated LDH -- likely from DVT related tissue injury.no LNadenopathy clinically or on recent scan to  Suggest a lymphoproliferative disorder.   [place] upper extremity in 11 weeks Labs in 11 weeks RTC with Dr Candise Che in 12 weeks    All of the patients questions were answered with apparent satisfaction. The patient knows to call the clinic with any problems, questions or concerns.  The toal time spent in the appt was 25 minutes and more than 50% was on counseling and direct patient cares.    Wyvonnia Lora MD MS AAHIVMS [facility] Hematology/Oncology Physician [facility]  (Office):       [phone] (Work cell):  [phone] (Fax):           [phone]  05/13/2018 2:24 PM  I, Marcelline Mates, am acting as a Neurosurgeon for Dr Candise Che.   .I have reviewed the above documentation for accuracy and completeness, and I agree with the above. Johney Maine MD

## 2018-05-13 ENCOUNTER — Telehealth: Payer: Self-pay

## 2018-05-13 ENCOUNTER — Inpatient Hospital Stay: Payer: Medicaid Other | Attending: Hematology | Admitting: Hematology

## 2018-05-13 VITALS — BP 179/87 | HR 64 | Temp 97.6°F | Resp 17 | Ht 68.0 in | Wt 227.3 lb

## 2018-05-13 DIAGNOSIS — D500 Iron deficiency anemia secondary to blood loss (chronic): Secondary | ICD-10-CM | POA: Diagnosis not present

## 2018-05-13 DIAGNOSIS — Z86718 Personal history of other venous thrombosis and embolism: Secondary | ICD-10-CM | POA: Insufficient documentation

## 2018-05-13 DIAGNOSIS — I82622 Acute embolism and thrombosis of deep veins of left upper extremity: Secondary | ICD-10-CM

## 2018-05-13 DIAGNOSIS — R740 Nonspecific elevation of levels of transaminase and lactic acid dehydrogenase [LDH]: Secondary | ICD-10-CM | POA: Insufficient documentation

## 2018-05-13 DIAGNOSIS — D6859 Other primary thrombophilia: Secondary | ICD-10-CM

## 2018-05-13 DIAGNOSIS — Z7901 Long term (current) use of anticoagulants: Secondary | ICD-10-CM | POA: Diagnosis not present

## 2018-05-13 NOTE — Telephone Encounter (Signed)
Printed avs and calender of upcoming appointment. Per 6/3 los 

## 2018-05-21 MED FILL — EZETIMIBE 10 MG TABLET: 10 | 30 days supply | Qty: 30 | Fill #0

## 2018-05-21 MED FILL — VENLAFAXINE HCL ER 150 MG C: 150 | 30 days supply | Qty: 30 | Fill #0

## 2018-05-21 MED FILL — QUETIAPINE 25 MG TABLET: 25 | 30 days supply | Qty: 30 | Fill #1

## 2018-05-21 MED FILL — LEVOTHYROXINE 25 MCG TABLET: 25 | 30 days supply | Qty: 30 | Fill #2

## 2018-05-21 MED FILL — ELIQUIS 5 MG TABLET: 5 | 30 days supply | Qty: 60 | Fill #1

## 2018-05-22 MED FILL — BELBUCA 300 MCG FILM: 300 | 30 days supply | Qty: 60 | Fill #0

## 2018-06-17 MED FILL — ELIQUIS 5 MG TABLET: 5 | 30 days supply | Qty: 60 | Fill #2

## 2018-06-17 MED FILL — QUETIAPINE 25 MG TABLET: 25 | 30 days supply | Qty: 30 | Fill #2

## 2018-06-17 MED FILL — VENLAFAXINE HCL ER 150 MG C: 150 | 30 days supply | Qty: 30 | Fill #1

## 2018-06-18 MED FILL — BELBUCA 300 MCG FILM: 300 | 30 days supply | Qty: 60 | Fill #0

## 2018-06-18 MED FILL — tiZANidine HCL 4 MG TABS: 4 | 30 days supply | Qty: 30 | Fill #0

## 2018-06-20 MED FILL — HYDROXYZINE PAM 25 MG CAP: 25 | 21 days supply | Qty: 41 | Fill #0

## 2018-06-20 MED FILL — SYMBYAX 3-25 MG CAPSULE: 3-25 | 30 days supply | Qty: 30 | Fill #0

## 2018-07-03 DIAGNOSIS — Z0279 Encounter for issue of other medical certificate: Secondary | ICD-10-CM

## 2018-07-11 MED FILL — AMLODIPINE BESYLATE 5 MG TA: 5 | 30 days supply | Qty: 30 | Fill #0

## 2018-07-16 MED FILL — VENLAFAXINE HCL ER 150 MG C: 150 | 30 days supply | Qty: 30 | Fill #2

## 2018-07-16 MED FILL — QUETIAPINE 25 MG TABLET: 25 | 30 days supply | Qty: 30 | Fill #0

## 2018-07-17 MED FILL — tiZANidine HCL 4 MG TABS: 4 | 30 days supply | Qty: 30 | Fill #0

## 2018-07-22 ENCOUNTER — Ambulatory Visit: Payer: Self-pay | Admitting: Surgery

## 2018-07-22 MED FILL — LEVOTHYROXINE 25 MCG TABLET: 25 | 30 days supply | Qty: 30 | Fill #3

## 2018-07-22 MED FILL — EZETIMIBE 10 MG TABLET: 10 | 30 days supply | Qty: 30 | Fill #1

## 2018-07-24 MED FILL — BELBUCA 300 MCG FILM: 300 | 30 days supply | Qty: 60 | Fill #0

## 2018-07-24 MED FILL — POTASSIUM CL ER 20 MEQ TAB: 20 | 30 days supply | Qty: 30 | Fill #2

## 2018-07-29 ENCOUNTER — Encounter: Payer: Self-pay | Admitting: Surgery

## 2018-07-29 ENCOUNTER — Ambulatory Visit: Payer: Medicaid Other | Admitting: Surgery

## 2018-07-29 ENCOUNTER — Inpatient Hospital Stay: Payer: Medicaid Other

## 2018-08-01 ENCOUNTER — Inpatient Hospital Stay: Payer: Medicaid Other | Attending: Hematology

## 2018-08-01 DIAGNOSIS — R740 Nonspecific elevation of levels of transaminase and lactic acid dehydrogenase [LDH]: Secondary | ICD-10-CM | POA: Insufficient documentation

## 2018-08-01 DIAGNOSIS — Z7901 Long term (current) use of anticoagulants: Secondary | ICD-10-CM | POA: Insufficient documentation

## 2018-08-01 DIAGNOSIS — Z86718 Personal history of other venous thrombosis and embolism: Secondary | ICD-10-CM | POA: Diagnosis not present

## 2018-08-01 DIAGNOSIS — I82622 Acute embolism and thrombosis of deep veins of left upper extremity: Secondary | ICD-10-CM

## 2018-08-01 DIAGNOSIS — D6859 Other primary thrombophilia: Secondary | ICD-10-CM

## 2018-08-01 DIAGNOSIS — D500 Iron deficiency anemia secondary to blood loss (chronic): Secondary | ICD-10-CM | POA: Diagnosis present

## 2018-08-01 LAB — CBC WITH DIFFERENTIAL/PLATELET
Basophils Absolute: 0 10*3/uL (ref 0.0–0.1)
Basophils Relative: 0 %
EOS PCT: 3 %
Eosinophils Absolute: 0.2 10*3/uL (ref 0.0–0.5)
HEMATOCRIT: 36.6 % (ref 34.8–46.6)
Hemoglobin: 11.5 g/dL — ABNORMAL LOW (ref 11.6–15.9)
LYMPHS PCT: 22 %
Lymphs Abs: 1.5 10*3/uL (ref 0.9–3.3)
MCH: 20.8 pg — AB (ref 25.1–34.0)
MCHC: 31.3 g/dL — AB (ref 31.5–36.0)
MCV: 66.6 fL — AB (ref 79.5–101.0)
MONO ABS: 0.5 10*3/uL (ref 0.1–0.9)
MONOS PCT: 8 %
NEUTROS ABS: 4.6 10*3/uL (ref 1.5–6.5)
Neutrophils Relative %: 67 %
PLATELETS: 264 10*3/uL (ref 145–400)
RBC: 5.5 MIL/uL — ABNORMAL HIGH (ref 3.70–5.45)
RDW: 16.8 % — AB (ref 11.2–14.5)
WBC: 6.8 10*3/uL (ref 3.9–10.3)

## 2018-08-01 LAB — CMP (CANCER CENTER ONLY)
ALT: 16 U/L (ref 0–44)
ANION GAP: 11 (ref 5–15)
AST: 22 U/L (ref 15–41)
Albumin: 4 g/dL (ref 3.5–5.0)
Alkaline Phosphatase: 114 U/L (ref 38–126)
BUN: 11 mg/dL (ref 6–20)
CO2: 26 mmol/L (ref 22–32)
Calcium: 9.5 mg/dL (ref 8.9–10.3)
Chloride: 104 mmol/L (ref 98–111)
Creatinine: 1.2 mg/dL — ABNORMAL HIGH (ref 0.44–1.00)
GFR, EST AFRICAN AMERICAN: 56 mL/min — AB (ref >60)
GFR, EST NON AFRICAN AMERICAN: 48 mL/min — AB (ref >60)
Glucose, Bld: 95 mg/dL (ref 70–99)
POTASSIUM: 3.5 mmol/L (ref 3.5–5.1)
Sodium: 141 mmol/L (ref 135–145)
TOTAL PROTEIN: 8 g/dL (ref 6.5–8.1)
Total Bilirubin: 0.5 mg/dL (ref 0.3–1.2)

## 2018-08-02 NOTE — Progress Notes (Signed)
HEMATOLOGY/ONCOLOGY CONSULTATION NOTE  Date of Service: 08/05/18    Patient Care Team: Karle PlumberArvind, Moogali M, MD as PCP - General (Internal Medicine)  CHIEF COMPLAINTS/PURPOSE OF CONSULTATION:  History of DVT of Left Upper Extremity  HISTORY OF PRESENTING ILLNESS:   Heather Hanson is a wonderful 57 y.o. female who has been referred to us by Dr Barney DrainMoogali Arvind for evaluation and management of History of DVT.   The pt reports that she had an acute DVT of her left upper extremity on 01/31/18 for which she underwent an angiojet thrombectomy and TPA. Her hospital stay was complicated by significant clots within her bladder and renal failure requiring 2 dialysis episodes. She was started on Eliquis while an inpatient at the time, and has since seen vascular surgeon Dr. Coral ElseVance Brabham on 04/15/18, with a follow up in August.  This was her first clot. She denies ever having an in-dewlling catheter, central line or PICC lines.   She notes that her arm began swelling in February. She denies unusual or new activities, falls, blood being drawn, or any changes in her lifestyle or medications. She waited four days from the onset of her arm swelling before presenting to the ED. She denies having kidney problems before this and she is now seeing Dr. Camille Balynthia Dunham as her nephrologist and is seeing Dr Berneice HeinrichManny for incontinence in urology, which developed after a reaction to contrast.  She denies anemia problems in the past outside of pregnancy. She denies any concern for sickle cell disease in her family, and denies blood diseases as well.   She has two boys and was pregnant twice with no blood clots. She used PO contraceptive pills for a couple years without blood clots. She had cholecystectomy and a partial hysterectomy for fibroids.  She received steroid injections for disk disease in her neck, and has not recently taken any steroids. She also had a surgery to remove bone spurs from her neck in 2017 without any  blood clots.   She has continued to take Eliquis since February.   Most recent lab results (02/19/18) of CBC  is as follows: all values are WNL except for WBC at 12.0k, RBC at 3.70, Hgb at 8.5, HCT at 27.6, MCV at 74.6, MCH at 23.0, RDW at 18.3. Renal function panel 02/19/18 revealed BUN at 36, Creatinine at 4.96, Phosphorous at 5.0, GRF at 10.   On review of systems, pt reports resolved hematomas, resolved arm swelling, neck pain, back pain, and denies chest pain, shortness of breath, new painful or swollen joints, skin rashes, abdominal pains, blood in the stools, blood in the urine, nose bleeds, gum bleeds, and any other symptoms.   On PMHx the pt reports acute cholecystitis 2014, depression, DVT 2019, fibromyalgia 2014, GERD 2014, hereditary and idiopathic peripheral neuropathy 2016, hyperlipidemia, hypoglycemia, and two seizures. On Social Hx the pt reports never smoking or consuming ETOH, and denies IV drug use.  On Family Hx the pt reports maternal type 2 DM and heart disease, and paternal HTN, thyroid disease, and prostate, lung, brain mets cancer (smoker). She denies blood diseases or blood clots.    Interval History:   Heather Hanson returns today regarding her acute DVT. The patient's last visit with us was on 05/13/18. The pt reports that she is doing well overall.   The pt reports that she has no remaining swelling or discomfort in her left arm. She notes that when she takes Eliquis, she occasionally notices increased palpitations which  last for 2-3 hours and notes that it resolves on its own. She notes that she takes Eliquis every night. The pt began BP medication in the interim. The pt notes that she has not had any bleeding problems while on Eliquis. The pt also notes that she would not like to pursue 81mg  aspirin instead of Eliquis as this previously caused her stomach pain and nausea.   The pt notes that she missed her follow up with Dr. Myra Gianotti in vascular surgery this month,  and did not have a repeated VAS [place] Upper Extremity as ordered.   Lab results (08/01/18) of CBC w/diff, CMP is as follows: all values are WNL except for RBC at 5.50, HGB at 11.5, MCV at 66.6, MCH at 20.8, MCHC at 31.3, RDW at 16.8, Creatinine at 1.20, GFR at 56. 08/01/18 Protein S Panel revealed all values WNL  On review of systems, pt reports heart palpitations, resolved left arm swelling and discomfort, stable energy levels, and denies blood in the stools, concerns for bleeding, nose bleeds, gum bleeds, CP, abdominal pains, and any other symptoms.   MEDICAL HISTORY:  Past Medical History:  Diagnosis Date  . Adjustment disorder with mixed anxiety and depressed mood 09/28/2009   Qualifier: Diagnosis of  By: Nena Jordan   . Allergic state 04/09/2013  . Anemia    thalassemia  . Anxiety    takes Ativan daily prn  . Arthritis    sees Dr.Beekman  . Bursitis of right shoulder   . Cholecystitis, acute 08/30/2013  . Chronic insomnia   . Complication of anesthesia    hard to sedate  . Constipation    takes Linzess daily prn  . Depression    takes Effexor daily  . Difficulty swallowing solids   . DVT (deep venous thrombosis) (HCC) 01/31/2018   LUE  . Fibromyalgia   . GERD (gastroesophageal reflux disease) 06/17/2013   takes Dexilant daily prn  . H. pylori infection 07/19/2013  . H/O hiatal hernia   . Headache(784.0) 05/22/2013  . Hereditary and idiopathic peripheral neuropathy 03/14/2015  . History of bronchitis    last time many yrs ago  . History of colonoscopy 2000  . Hyperlipidemia    not on any meds   . Hypoglycemia   . Hypothyroidism   . Lichen    [facility]Fleisher at [facility]  . Migraines    takes Topamax daily-last migraine today  . Muscle spasms of head and/or neck    takes Norflex daily prn  . Nausea    takes Phenergan prn  . Obesity   . Peripheral edema    takes Lasix prn  . PONV (postoperative nausea and vomiting)    with tonsil removal surgery  . PUD (peptic ulcer  disease)    Dr Loreta Ave ( [facility])  . Seizure disorder (HCC) x 2   presumed secondary to ativan withdrawal -Dr Sharene Skeans  . Seizures (HCC)    X 2  . Shortness of breath    pt states when Fibromyalgia flares up  . Sinusitis, acute 04/09/2013   takes Zyrtec daily    SURGICAL HISTORY: Past Surgical History:  Procedure Laterality Date  . ABDOMINAL HYSTERECTOMY  2000   partial  . CHOLECYSTECTOMY N/A 09/16/2013   Procedure: LAPAROSCOPIC CHOLECYSTECTOMY;  Surgeon: Emelia Loron, MD;  Location: [facility] OR;  Service: General;  Laterality: N/A;  . COLONOSCOPY    . IR FLUORO GUIDE CV LINE RIGHT  02/06/2018  . IR US GUIDE VASC ACCESS RIGHT  02/06/2018  . PERIPHERAL VASCULAR THROMBECTOMY Left 01/31/2018   Procedure: PERIPHERAL VASCULAR THROMBECTOMY;  Surgeon: Fransisco Hertz, MD;  Location: [facility] INVASIVE CV LAB;  Service: Cardiovascular;  Laterality: Left;  . REMOVAL OF CERVICAL EXOSTOSIS N/A 06/08/2016   Procedure: REMOVAL OF  EXOSTOSIS ANTERIOR CERVICAL SPINE;  Surgeon: Venita Lick, MD;  Location: [facility] OR;  Service: Orthopedics;  Laterality: N/A;  . TONSILLECTOMY  1972    SOCIAL HISTORY: Social History   Socioeconomic History  . Marital status: Divorced    Spouse name: Not on file  . Number of children: 2  . Years of education: Not on file  . Highest education level: Not on file  Occupational History  . Occupation: LAB TECH    Employer: [employer]    Comment: phlebotomist  Social Needs  . Financial resource strain: Not on file  . Food insecurity:    Worry: Not on file    Inability: Not on file  . Transportation needs:    Medical: Not on file    Non-medical: Not on file  Tobacco Use  . Smoking status: Never Smoker  . Smokeless tobacco: Never Used  Substance and Sexual Activity  . Alcohol use: Yes    Frequency: Never    Comment: rare use  . Drug use: No    Comment: Rare  . Sexual activity: Yes    Birth control/protection: Surgical    Comment: lives with,   Lifestyle  .  Physical activity:    Days per week: Not on file    Minutes per session: Not on file  . Stress: Not on file  Relationships  . Social connections:    Talks on phone: Not on file    Gets together: Not on file    Attends religious service: Not on file    Active member of club or organization: Not on file    Attends meetings of clubs or organizations: Not on file    Relationship status: Not on file  . Intimate partner violence:    Fear of current or ex partner: Not on file    Emotionally abused: Not on file    Physically abused: Not on file    Forced sexual activity: Not on file  Other Topics Concern  . Not on file  Social History Narrative   Occupation: Water quality scientist   Divorced     2 sons 26, 27      Never Smoked     Alcohol use-no       FAMILY HISTORY: Family History  Problem Relation Age of Onset  . Diabetes Mother        typer 2  . Heart disease Mother        PVD, PAD  . Cancer Father        prostate, lung, brain mets, smoker  . COPD Father   . Hyperlipidemia Father   . Hypertension Father   . Thyroid disease Father   . Hypertension Sister   . Thyroid disease Sister   . Arthritis Sister   . Diabetes Son 16       type 1  . Diabetes Maternal Grandmother   . Stroke Maternal Grandfather   . Cancer Paternal Grandmother 68       breast  . Alcohol abuse Paternal Grandfather   . Hypertension Sister   . Obesity Sister   . Psoriasis Son   . Cancer Unknown        Breast < 65 yo, ,Prostate <50 yo  .  Coronary artery disease Unknown        <60 yo  . Diabetes Unknown     ALLERGIES:  is allergic to aspirin; augmentin [amoxicillin-pot clavulanate]; dexilant [dexlansoprazole]; hydrocodone-acetaminophen; influenza vaccines; levsin [hyoscyamine sulfate]; linzess [linaclotide]; promethazine; propoxyphene n-acetaminophen; statins; acetaminophen; latex; and oxycodone-acetaminophen.  MEDICATIONS:  Current Outpatient Medications  Medication Sig Dispense Refill  . Bisacodyl  (DULCOLAX PO) Take 10 mg by mouth as needed (constipation).    . gabapentin (NEURONTIN) 300 MG capsule Take 1 capsule (300 mg total) by mouth at bedtime. 30 capsule 0  . hydrOXYzine (ATARAX/VISTARIL) 50 MG tablet Take 1 tablet (50 mg total) by mouth 3 (three) times daily as needed for anxiety. 30 tablet 0  . levothyroxine (SYNTHROID, LEVOTHROID) 25 MCG tablet TAKE 1 TABLET BY MOUTH DAILY BEFORE BREAKFAST 30 tablet 0  . rivaroxaban (XARELTO) 20 MG TABS tablet Take 1 tablet (20 mg total) by mouth daily with supper. Stop taking Eliquis and start taking Xarelto as prescribed. Future refills with PCP. 30 tablet 1  . topiramate (TOPAMAX) 100 MG tablet Take 1 tablet (100 mg total) by mouth 2 (two) times daily. 60 tablet 0  . venlafaxine XR (EFFEXOR-XR) 75 MG 24 hr capsule Take 3 capsules (225 mg total) by mouth daily with breakfast. 90 capsule 0  . ZETIA 10 MG tablet Take 10 mg by mouth daily.  2   No current facility-administered medications for this visit.     REVIEW OF SYSTEMS:    A 10+ POINT REVIEW OF SYSTEMS WAS OBTAINED including neurology, dermatology, psychiatry, cardiac, respiratory, lymph, extremities, GI, GU, Musculoskeletal, constitutional, breasts, reproductive, HEENT.  All pertinent positives are noted in the HPI.  All others are negative.   PHYSICAL EXAMINATION:  . Vitals:   08/05/18 1421  BP: (!) 155/82  Pulse: 86  Resp: 18  Temp: 98.7 F (37.1 C)  SpO2: 100%   Filed Weights   08/05/18 1421  Weight: 225 lb 8 oz (102.3 kg)   .Body mass index is 34.29 kg/m.  GENERAL:alert, in no acute distress and comfortable SKIN: no acute rashes, no significant lesions EYES: conjunctiva are pink and non-injected, sclera anicteric OROPHARYNX: MMM, no exudates, no oropharyngeal erythema or ulceration NECK: supple, no JVD LYMPH:  no palpable lymphadenopathy in the cervical, axillary or inguinal regions LUNGS: clear to auscultation b/l with normal respiratory effort HEART: regular rate  & rhythm ABDOMEN:  normoactive bowel sounds , non tender, not distended. No palpable hepatosplenomegaly.  Extremity: no pedal edema PSYCH: alert & oriented x 3 with fluent speech NEURO: no focal motor/sensory deficits    LABORATORY DATA:  I have reviewed the data as listed  . CBC Latest Ref Rng & Units 08/01/2018 04/25/2018 02/19/2018  WBC 3.9 - 10.3 K/uL 6.8 6.2 12.0(H)  Hemoglobin 11.6 - 15.9 g/dL 11.5(L) 10.9(L) 8.5(L)  Hematocrit 34.8 - 46.6 % 36.6 35.7 27.6(L)  Platelets 145 - 400 K/uL 264 271 324    . CMP Latest Ref Rng & Units 08/01/2018 04/25/2018 02/19/2018  Glucose 70 - 99 mg/dL 95 90 161(W)  BUN 6 - 20 mg/dL 11 12 96(E)  Creatinine 0.44 - 1.00 mg/dL 4.54(U) 9.81(X) 9.14(N)  Sodium 135 - 145 mmol/L 141 140 138  Potassium 3.5 - 5.1 mmol/L 3.5 3.5 3.9  Chloride 98 - 111 mmol/L 104 102 106  CO2 22 - 32 mmol/L 26 29 20(L)  Calcium 8.9 - 10.3 mg/dL 9.5 9.3 9.1  Total Protein 6.5 - 8.1 g/dL 8.0 7.6 -  Total Bilirubin 0.3 -  1.2 mg/dL 0.5 0.3 -  Alkaline Phos 38 - 126 U/L 114 89 -  AST 15 - 41 U/L 22 18 -  ALT 0 - 44 U/L 16 11 -   .\ Component     Latest Ref Rng & Units 04/25/2018  IgG (Immunoglobin G), Serum     700 - 1,600 mg/dL 1,191  IgA     87 - 478 mg/dL 295  IgM (Immunoglobulin M), Srm     26 - 217 mg/dL 83  Total Protein ELP     6.0 - 8.5 g/dL 7.0  Albumin SerPl Elph-Mcnc     2.9 - 4.4 g/dL 3.8  Alpha 1     0.0 - 0.4 g/dL 0.2  Alpha2 Glob SerPl Elph-Mcnc     0.4 - 1.0 g/dL 0.9  B-Globulin SerPl Elph-Mcnc     0.7 - 1.3 g/dL 1.2  Gamma Glob SerPl Elph-Mcnc     0.4 - 1.8 g/dL 0.9  M Protein SerPl Elph-Mcnc     Not Observed g/dL Not Observed  Globulin, Total     2.2 - 3.9 g/dL 3.2  Albumin/Glob SerPl     0.7 - 1.7 1.2  IFE 1      Comment  Please Note (HCV):      Comment  Kappa free light chain     3.3 - 19.4 mg/L 29.1 (H)  Lamda free light chains     5.7 - 26.3 mg/L 14.5  Kappa, lamda light chain ratio     0.26 - 1.65 2.01 (H)  LDH     125 - 245  U/L 297 (H)  Sed Rate     0 - 22 mm/hr 36 (H)    [place] ext venous 01/30/2018: IMPRESSION: 1. Positive for deep venous thrombosis involving the paired brachial veins in the subclavian vein with thrombus extending into the innominate vein. The brachial venous DVT is occlusive while the subclavian and innominate DVT is nonocclusive. 2. Positive for superficial venous thrombosis involving the left basilic vein.  CTA chest 01/30/2018: IMPRESSION: 1. No evidence for acute pulmonary embolus. No active cardiopulmonary abnormalities noted. 2.  Aortic Atherosclerosis (ICD10-I70.0). 3. Right upper lobe pulmonary nodule measures 3 mm. No follow-up needed if patient is low-risk. Non-contrast chest CT can be considered in 12 months if patient is high-risk. This recommendation follows the consensus statement: Guidelines for Management of Incidental Pulmonary Nodules Detected on CT Images: From the Fleischner Society 2017; Radiology 2017; 284:228-243.   Electronically Signed   By: Signa Kell M.D.   On: 01/30/2018 14:01  CT abd: 02/12/2018 MPRESSION: 1. Punctate right-sided nonobstructing interpolar renal calculus. No hydroureteronephrosis. 2. Slightly thick-walled appearance of the urinary bladder is likely due to underdistention. Cystitis is not excluded but believed less likely. 3. Small subcentimeter hypodensities in the left hepatic lobe dating back to 2014 consistent with benign findings, statistically more likely to represent hepatic cysts or hemangiomata. 4. Increased fecal residue throughout the colon. Query constipation.   Electronically Signed   By: Tollie Eth M.D.    RADIOGRAPHIC STUDIES: I have personally reviewed the radiological images as listed and agreed with the findings in the report. No results found.  ASSESSMENT & PLAN:   57 y.o. female with   1. History of extensive DVT left upper extremity on 01/31/18.extending into innominate veins. Appears to have had  superficial venous thrombosis involving the left basilic vein.  ? DVT caused by clot extension from superficial venous thrombosis ?related to IV line. Patient denies IV  access in the area. ? Trigger or  Provoking factors.   2. S/p AKI creatinine 4.96 on 02/19/2018 Creatinine in now down to 1.26.   PLAN: -Recommend age appropriate and symptom directed cancer screening with PCP -would recommend f/u with vascular surgery to have them weigh in regarding any local vascular compressive or other risk factors that contributed to extensive LUE DVT. -recent CTA chest in 01/2018 and subsequent CTA abd/pelvis in 02/2018 --given ont show any overt abnormalities -Factor V Leiden 04/25/18 was negative Prothrombin gene mutation 04/25/18 was negative Protein C activity 04/25/18 was WNL at 143 Protein S activity 04/25/18 was slightly low at 58 but normal on rpt Antithrombin III 04/25/18 was WNL at 113.  Cardiolipin antibodies 04/25/18 was WNL  -Avoid oral contraceptives or estrogen containing pills -Increase hydration -Compression socks on long travels, avoiding excess alcohol and caffeine as well -Discussed pt labwork from 08/01/18; Protein S panel was normal, blood counts and chemistries are stable -Protein S normalized since May  -Discussed the benefits and risks of staying on long-term blood thinners, pt prefers to remain on long term blood thinners but would like to switch from Eliquis to Xarelto -Advised taking Xarelto at the same time each day -Recommend that PCP Dr. Barney Drain continue to follow the pt's long term Xarelto use unless pt develops concerns of bleeding -Will see the pt back if any new concerns develop   3. Anemia due to bleeding s/p TPA. hgb up from 8.5---> 10.9--_ 11.5 Plan -Iron replacement  4. Elevated LDH -- likely from DVT related tissue injury.no LNadenopathy clinically or on recent scan to  Suggest a lymphoproliferative disorder.   RTC with Dr Candise Che as needed   All of the  patients questions were answered with apparent satisfaction. The patient knows to call the clinic with any problems, questions or concerns.  The total time spent in the appt was 25 minutes and more than 50% was on counseling and direct patient cares.    Wyvonnia Lora MD MS AAHIVMS [facility] Hematology/Oncology Physician [facility]  (Office):       [phone] (Work cell):  [phone] (Fax):           [phone]  08/05/2018 3:12 PM  I, Marcelline Mates, am acting as a scribe for Dr. Candise Che  .I have reviewed the above documentation for accuracy and completeness, and I agree with the above. Johney Maine MD

## 2018-08-03 LAB — PROTEIN S PANEL
PROTEIN S AG FREE: 109 % (ref 57–157)
Protein S Activity: 68 % (ref 63–140)
Protein S Ag, Total: 100 % (ref 60–150)

## 2018-08-05 ENCOUNTER — Inpatient Hospital Stay (HOSPITAL_BASED_OUTPATIENT_CLINIC_OR_DEPARTMENT_OTHER): Payer: Medicaid Other | Admitting: Hematology

## 2018-08-05 VITALS — BP 155/82 | HR 86 | Temp 98.7°F | Resp 18 | Ht 68.0 in | Wt 225.5 lb

## 2018-08-05 DIAGNOSIS — Z86718 Personal history of other venous thrombosis and embolism: Secondary | ICD-10-CM

## 2018-08-05 DIAGNOSIS — R740 Nonspecific elevation of levels of transaminase and lactic acid dehydrogenase [LDH]: Secondary | ICD-10-CM

## 2018-08-05 DIAGNOSIS — I82622 Acute embolism and thrombosis of deep veins of left upper extremity: Secondary | ICD-10-CM

## 2018-08-05 DIAGNOSIS — D500 Iron deficiency anemia secondary to blood loss (chronic): Secondary | ICD-10-CM

## 2018-08-05 DIAGNOSIS — D6859 Other primary thrombophilia: Secondary | ICD-10-CM

## 2018-08-05 DIAGNOSIS — Z7901 Long term (current) use of anticoagulants: Secondary | ICD-10-CM

## 2018-08-05 MED ORDER — RIVAROXABAN 20 MG PO TABS: 20.0000 mg | ORAL_TABLET | Freq: Every day | ORAL | 1 refills | Status: DC

## 2018-08-05 MED FILL — XARELTO 20 MG TABLET: 20 | 30 days supply | Qty: 30 | Fill #0

## 2018-08-06 ENCOUNTER — Telehealth: Payer: Self-pay

## 2018-08-06 NOTE — Telephone Encounter (Signed)
RTC with Dr Candise CheKale as needed. Per 8/26 los

## 2018-08-07 ENCOUNTER — Encounter: Payer: Self-pay | Admitting: Hematology

## 2018-08-09 MED FILL — SYMBYAX 3-25 MG CAPSULE: 3-25 | 30 days supply | Qty: 30 | Fill #0

## 2018-08-09 MED FILL — AMLODIPINE BESYLATE 5 MG TA: 5 | 30 days supply | Qty: 30 | Fill #1

## 2018-08-09 MED FILL — VENLAFAXINE HCL ER 150 MG C: 150 | 30 days supply | Qty: 30 | Fill #0

## 2018-08-09 MED FILL — HYDROXYZINE PAM 25 MG CAP: 25 | 22 days supply | Qty: 45 | Fill #0

## 2018-08-09 MED FILL — QUETIAPINE 25 MG TABLET: 25 | 90 days supply | Qty: 90 | Fill #0

## 2018-08-13 MED FILL — BELBUCA 450 MCG FILM: 450 | 30 days supply | Qty: 60 | Fill #0

## 2018-08-13 MED FILL — CELECOXIB 200 MG CAP: 200 | 30 days supply | Qty: 30 | Fill #0

## 2018-08-14 MED FILL — LEVOTHYROXINE 25 MCG TABLET: 25 | 30 days supply | Qty: 30 | Fill #0

## 2018-08-16 MED FILL — AMLODIPINE BESYLATE 10 MG T: 10 | 30 days supply | Qty: 30 | Fill #0

## 2018-08-21 MED FILL — tiZANidine HCL 4 MG TABS: 4 | 30 days supply | Qty: 30 | Fill #0

## 2018-09-09 MED FILL — CELECOXIB 200 MG CAP: 200 | 30 days supply | Qty: 30 | Fill #0

## 2018-09-09 MED FILL — POTASSIUM CL ER 20 MEQ TAB: 20 | 30 days supply | Qty: 30 | Fill #0

## 2018-09-09 MED FILL — LEVOTHYROXINE 25 MCG TABLET: 25 | 30 days supply | Qty: 30 | Fill #1

## 2018-09-09 MED FILL — EZETIMIBE 10 MG TABLET: 10 | 30 days supply | Qty: 30 | Fill #2

## 2018-09-09 MED FILL — tiZANidine HCL 4 MG TABS: 4 | 30 days supply | Qty: 30 | Fill #0

## 2018-09-09 MED FILL — VENLAFAXINE HCL ER 150 MG C: 150 | 30 days supply | Qty: 30 | Fill #1

## 2018-09-09 MED FILL — AMLODIPINE BESYLATE 10 MG T: 10 | 30 days supply | Qty: 30 | Fill #1

## 2018-09-09 MED FILL — SYMBYAX 3-25 MG CAPSULE: 3-25 | 30 days supply | Qty: 30 | Fill #1

## 2018-09-09 MED FILL — XARELTO 20 MG TABLET: 20 | 30 days supply | Qty: 30 | Fill #1

## 2018-09-10 MED FILL — HYDROXYZINE PAM 25 MG CAP: 25 | 23 days supply | Qty: 45 | Fill #0

## 2018-09-13 MED FILL — BELBUCA 450 MCG FILM: 450 | 30 days supply | Qty: 60 | Fill #0

## 2018-10-07 MED FILL — EZETIMIBE 10 MG TABLET: 10 | 30 days supply | Qty: 30 | Fill #3

## 2018-10-07 MED FILL — SYMBYAX 3-25 MG CAPSULE: 3-25 | 30 days supply | Qty: 30 | Fill #2

## 2018-10-07 MED FILL — AMLODIPINE BESYLATE 10 MG T: 10 | 30 days supply | Qty: 30 | Fill #2

## 2018-10-07 MED FILL — tiZANidine HCL 4 MG TABS: 4 | 30 days supply | Qty: 30 | Fill #1

## 2018-10-07 MED FILL — LEVOTHYROXINE 25 MCG TABLET: 25 | 30 days supply | Qty: 30 | Fill #2

## 2018-10-07 MED FILL — VENLAFAXINE HCL ER 150 MG C: 150 | 30 days supply | Qty: 30 | Fill #2

## 2018-10-08 MED FILL — XARELTO 20 MG TABLET: 20 | 30 days supply | Qty: 30 | Fill #0

## 2018-10-08 MED FILL — PHENTERMINE 37.5 MG TABLET: 37.5 | 30 days supply | Qty: 30 | Fill #0

## 2018-10-08 MED FILL — BELBUCA 600 MCG FILM: 600 | 30 days supply | Qty: 60 | Fill #0

## 2018-10-09 MED FILL — HYDROXYZINE PAM 25 MG CAP: 25 | 30 days supply | Qty: 60 | Fill #0

## 2018-10-14 MED FILL — LABETALOL HCL 100 MG TABS: 100 | 30 days supply | Qty: 60 | Fill #0

## 2018-11-04 MED FILL — HYDROXYZINE PAM 25 MG CAP: 25 | 30 days supply | Qty: 60 | Fill #1

## 2018-11-04 MED FILL — tiZANidine HCL 4 MG TABS: 4 | 30 days supply | Qty: 30 | Fill #2

## 2018-11-04 MED FILL — VENLAFAXINE HCL ER 150 MG C: 150 | 30 days supply | Qty: 30 | Fill #3

## 2018-11-04 MED FILL — LEVOTHYROXINE 25 MCG TABLET: 25 | 30 days supply | Qty: 30 | Fill #3

## 2018-11-04 MED FILL — AMLODIPINE BESYLATE 10 MG T: 10 | 30 days supply | Qty: 30 | Fill #3

## 2018-11-04 MED FILL — XARELTO 20 MG TABLET: 20 | 30 days supply | Qty: 30 | Fill #1

## 2018-11-04 MED FILL — QUETIAPINE 25 MG TABLET: 25 | 90 days supply | Qty: 90 | Fill #0

## 2018-11-04 MED FILL — VIT D2 1.25 MG (50,000 UNIT: 1.25 MG | 28 days supply | Qty: 4 | Fill #0

## 2018-11-05 MED FILL — SYMBYAX 3-25 MG CAPSULE: 3-25 | 30 days supply | Qty: 30 | Fill #0

## 2018-11-05 MED FILL — BELBUCA 600 MCG FILM: 600 | 30 days supply | Qty: 60 | Fill #0

## 2018-11-05 MED FILL — PHENTERMINE 37.5 MG TABLET: 37.5 | 30 days supply | Qty: 30 | Fill #0

## 2018-11-05 MED FILL — EZETIMIBE 10 MG TABS: 10 | 30 days supply | Qty: 30 | Fill #0

## 2018-11-27 MED FILL — CIPROFLOXACIN HCL 500 MG TA: 500 | 7 days supply | Qty: 14 | Fill #0

## 2019-01-10 MED FILL — VENLAFAXINE HCL ER 150 MG C: 150 | 30 days supply | Qty: 30 | Fill #0

## 2019-01-10 MED FILL — QUETIAPINE 25 MG TABLET: 25 | 30 days supply | Qty: 30 | Fill #0

## 2019-01-10 MED FILL — tiZANidine HCL 4 MG TABS: 4 | 30 days supply | Qty: 30 | Fill #0

## 2019-01-13 MED FILL — PHENTERMINE 37.5 MG TABLET: 37.5 | 30 days supply | Qty: 30 | Fill #0

## 2019-01-13 MED FILL — XARELTO 20 MG TABLET: 20 | 30 days supply | Qty: 30 | Fill #3

## 2019-01-13 MED FILL — SYMBYAX 3-25 MG CAPSULE: 3-25 | 30 days supply | Qty: 30 | Fill #2

## 2019-01-13 MED FILL — AMLODIPINE BESYLATE 10 MG T: 10 | 30 days supply | Qty: 30 | Fill #5

## 2019-01-13 MED FILL — BELBUCA 600 MCG FILM: 600 | 30 days supply | Qty: 60 | Fill #0

## 2019-01-13 MED FILL — EZETIMIBE 10 MG TABS: 10 | 30 days supply | Qty: 30 | Fill #2

## 2019-01-13 MED FILL — LABETALOL HCL 100 MG TABS: 100 | 30 days supply | Qty: 60 | Fill #2

## 2019-01-13 MED FILL — HYDROXYZINE PAM 25 MG CAP: 25 | 30 days supply | Qty: 60 | Fill #1

## 2019-01-13 MED FILL — LEVOTHYROXINE 25 MCG TABLET: 25 | 30 days supply | Qty: 30 | Fill #1

## 2019-01-16 MED FILL — LABETALOL HCL 200 MG TABS: 200 | 30 days supply | Qty: 60 | Fill #0

## 2019-02-11 MED FILL — tiZANidine HCL 4 MG TABS: 4 | 30 days supply | Qty: 30 | Fill #1

## 2019-02-11 MED FILL — LEVOTHYROXINE 25 MCG TABLET: 25 | 30 days supply | Qty: 30 | Fill #2

## 2019-02-11 MED FILL — QUETIAPINE FUMARATE 50 MG T: 50 | 90 days supply | Qty: 90 | Fill #0

## 2019-02-11 MED FILL — EZETIMIBE 10 MG TABS: 10 | 30 days supply | Qty: 30 | Fill #3

## 2019-02-11 MED FILL — XARELTO 20 MG TABLET: 20 | 30 days supply | Qty: 30 | Fill #4

## 2019-02-11 MED FILL — BELBUCA 600 MCG FILM: 600 | 30 days supply | Qty: 60 | Fill #0

## 2019-02-11 MED FILL — LORazepam 1 MG TABS: 1 | 15 days supply | Qty: 15 | Fill #0

## 2019-02-11 MED FILL — LABETALOL HCL 200 MG TABS: 200 | 30 days supply | Qty: 60 | Fill #1

## 2019-02-11 MED FILL — VENLAFAXINE HCL ER 150 MG C: 150 | 30 days supply | Qty: 30 | Fill #1

## 2019-02-12 MED FILL — AMLODIPINE BESYLATE 10 MG T: 10 | 30 days supply | Qty: 30 | Fill #0

## 2019-02-12 MED FILL — PHENTERMINE 37.5 MG TABLET: 37.5 | 30 days supply | Qty: 30 | Fill #0

## 2019-02-25 MED FILL — LORazepam 1 MG TABS: 1 | 30 days supply | Qty: 30 | Fill #0

## 2019-03-17 MED FILL — VENLAFAXINE HCL ER 150 MG C: 150 | 30 days supply | Qty: 30 | Fill #2

## 2019-03-17 MED FILL — tiZANidine HCL 4 MG TABS: 4 | 30 days supply | Qty: 30 | Fill #2

## 2019-03-17 MED FILL — LABETALOL HCL 200 MG TABLET: 200 | 30 days supply | Qty: 60 | Fill #2

## 2019-03-25 MED FILL — PHENTERMINE 37.5 MG TABLET: 37.5 | 30 days supply | Qty: 30 | Fill #0

## 2019-03-25 MED FILL — BELBUCA 600 MCG FILM: 600 | 30 days supply | Qty: 60 | Fill #0

## 2019-03-25 MED FILL — LORazepam 1 MG TABS: 1 | 30 days supply | Qty: 30 | Fill #0

## 2019-04-14 MED FILL — AMLODIPINE BESYLATE 10 MG T: 10 | 30 days supply | Qty: 30 | Fill #1

## 2019-04-14 MED FILL — XARELTO 20 MG TABLET: 20 | 30 days supply | Qty: 30 | Fill #5

## 2019-04-14 MED FILL — LEVOTHYROXINE 25 MCG TABLET: 25 | 30 days supply | Qty: 30 | Fill #3

## 2019-04-14 MED FILL — EZETIMIBE 10 MG TABS: 10 | 30 days supply | Qty: 30 | Fill #0

## 2019-04-14 MED FILL — tiZANidine HCL 4 MG TABS: 4 | 30 days supply | Qty: 30 | Fill #3

## 2019-04-14 MED FILL — VENLAFAXINE HCL ER 150 MG C: 150 | 30 days supply | Qty: 30 | Fill #3

## 2019-04-14 MED FILL — LABETALOL HCL 200 MG TABLET: 200 | 30 days supply | Qty: 60 | Fill #3

## 2019-04-22 MED FILL — HYDROXYZINE PAM 25 MG CAP: 25 | 30 days supply | Qty: 30 | Fill #0

## 2019-04-22 MED FILL — LORazepam 1 MG TABS: 1 | 30 days supply | Qty: 30 | Fill #0

## 2019-04-22 MED FILL — PHENTERMINE 37.5 MG TABLET: 37.5 | 30 days supply | Qty: 30 | Fill #0

## 2019-05-02 MED FILL — BELBUCA 600 MCG FILM: 600 | 30 days supply | Qty: 60 | Fill #0

## 2019-05-06 MED FILL — QUETIAPINE FUMARATE 50 MG T: 50 | 90 days supply | Qty: 90 | Fill #0

## 2019-05-14 MED FILL — XARELTO 20 MG TABLET: 20 | 30 days supply | Qty: 30 | Fill #6

## 2019-05-14 MED FILL — LABETALOL HCL 200 MG TABLET: 200 | 30 days supply | Qty: 60 | Fill #4

## 2019-05-14 MED FILL — AMLODIPINE BESYLATE 10 MG T: 10 | 30 days supply | Qty: 30 | Fill #2

## 2019-05-14 MED FILL — EZETIMIBE 10 MG TABS: 10 | 30 days supply | Qty: 30 | Fill #1

## 2019-05-16 MED FILL — tiZANidine HCL 4 MG TABS: 4 | 30 days supply | Qty: 30 | Fill #0

## 2019-05-16 MED FILL — VENLAFAXINE HCL ER 150 MG C: 150 | 30 days supply | Qty: 30 | Fill #3

## 2019-05-16 MED FILL — LEVOTHYROXINE 25 MCG TABLET: 25 | 30 days supply | Qty: 30 | Fill #0

## 2019-05-22 MED FILL — PHENTERMINE 37.5 MG TABLET: 37.5 | 30 days supply | Qty: 30 | Fill #0

## 2019-05-22 MED FILL — LORazepam 1 MG TABS: 1 | 15 days supply | Qty: 30 | Fill #0

## 2019-05-22 MED FILL — HYDROXYZINE PAM 25 MG CAP: 25 | 30 days supply | Qty: 90 | Fill #0

## 2019-05-27 MED FILL — DOXEPIN 25 MG CAPSULE: 25 | 21 days supply | Qty: 42 | Fill #0

## 2019-06-05 MED FILL — LORazepam 1 MG TABS: 1 | 30 days supply | Qty: 30 | Fill #0

## 2019-06-13 MED FILL — AMLODIPINE BESYLATE 10 MG T: 10 | 30 days supply | Qty: 30 | Fill #3

## 2019-06-13 MED FILL — EZETIMIBE 10 MG TABS: 10 | 30 days supply | Qty: 30 | Fill #2

## 2019-06-13 MED FILL — LABETALOL HCL 200 MG TABLET: 200 | 30 days supply | Qty: 60 | Fill #5

## 2019-06-17 MED FILL — valACYclovir HCL 1 GM TABS: 1 | 7 days supply | Qty: 21 | Fill #0

## 2019-06-17 MED FILL — BELBUCA 600 MCG FILM: 600 | 30 days supply | Qty: 60 | Fill #0

## 2019-06-17 MED FILL — tiZANidine HCL 4 MG TABS: 4 | 30 days supply | Qty: 30 | Fill #0

## 2019-06-20 MED FILL — LORazepam 1 MG TABS: 1 | 30 days supply | Qty: 60 | Fill #0

## 2019-06-20 MED FILL — VENLAFAXINE HCL ER 150 MG C: 150 | 30 days supply | Qty: 30 | Fill #0

## 2019-07-01 MED FILL — DOXEPIN 25 MG CAPSULE: 25 | 33 days supply | Qty: 100 | Fill #0

## 2019-07-10 MED FILL — AMLODIPINE BESYLATE 10 MG T: 10 | 30 days supply | Qty: 30 | Fill #4

## 2019-07-10 MED FILL — EZETIMIBE 10 MG TABS: 10 | 30 days supply | Qty: 30 | Fill #3

## 2019-07-11 MED FILL — LABETALOL HCL 200 MG TABLET: 200 | 30 days supply | Qty: 30 | Fill #0

## 2019-07-15 ENCOUNTER — Other Ambulatory Visit: Payer: Self-pay | Admitting: Physician Assistant

## 2019-07-15 ENCOUNTER — Other Ambulatory Visit: Payer: Self-pay | Admitting: Internal Medicine

## 2019-07-15 DIAGNOSIS — Z1231 Encounter for screening mammogram for malignant neoplasm of breast: Secondary | ICD-10-CM

## 2019-07-15 MED FILL — PHENTERMINE 37.5 MG TABLET: 37.5 | 30 days supply | Qty: 30 | Fill #0

## 2019-07-22 MED FILL — BELBUCA 600 MCG FILM: 600 | 30 days supply | Qty: 60 | Fill #0

## 2019-07-22 MED FILL — LORAZEPAM 1 MG TABS: 1 | 30 days supply | Qty: 60 | Fill #0

## 2019-07-22 MED FILL — LABETALOL HCL 200 MG TABLET: 200 | 30 days supply | Qty: 30 | Fill #0

## 2019-07-22 MED FILL — AMLODIPINE BESYLATE 10 MG T: 10 | 30 days supply | Qty: 30 | Fill #4

## 2019-07-22 MED FILL — SULFAMETHOXAZOLE-TMP DS TAB: 800-160 | 8 days supply | Qty: 16 | Fill #0

## 2019-07-22 MED FILL — tiZANidine HCL 4 MG TABS: 4 | 30 days supply | Qty: 30 | Fill #1

## 2019-07-22 MED FILL — EZETIMIBE 10 MG TABS: 10 | 30 days supply | Qty: 30 | Fill #3

## 2019-08-04 MED FILL — QUETIAPINE FUMARATE 50 MG T: 50 | 30 days supply | Qty: 30 | Fill #0

## 2019-08-05 MED FILL — VIT D2 1.25 MG (50,000 UNIT: 1.25 MG | 28 days supply | Qty: 4 | Fill #0

## 2019-08-05 MED FILL — HYDROXYZINE PAM 25 MG CAP: 25 | 30 days supply | Qty: 90 | Fill #1

## 2019-08-06 MED FILL — VENLAFAXINE HCL ER 150 MG C: 150 | 30 days supply | Qty: 30 | Fill #0

## 2019-08-13 MED FILL — DOXYCYCLINE MONO 100 MG CAP: 100 | 30 days supply | Qty: 60 | Fill #0

## 2019-08-13 MED FILL — CLOBETASOL PROP 0.05% OINT: 0.05 | 30 days supply | Qty: 60 | Fill #0

## 2019-08-13 MED FILL — VENLAFAXINE HCL ER 150 MG C: 150 | 30 days supply | Qty: 30 | Fill #0

## 2019-08-22 MED FILL — BELBUCA 600 MCG FILM: 600 | 30 days supply | Qty: 60 | Fill #0

## 2019-08-22 MED FILL — tiZANidine HCL 4 MG TABS: 4 | 30 days supply | Qty: 30 | Fill #2

## 2019-08-22 MED FILL — methIMAzole 10 MG TABS: 10 | 30 days supply | Qty: 60 | Fill #0

## 2019-08-25 MED FILL — LORAZEPAM 1 MG TABS: 1 | 30 days supply | Qty: 60 | Fill #0

## 2019-08-25 MED FILL — LABETALOL HCL 200 MG TABLET: 200 | 30 days supply | Qty: 30 | Fill #1

## 2019-09-01 ENCOUNTER — Ambulatory Visit
Admission: RE | Admit: 2019-09-01 | Discharge: 2019-09-01 | Disposition: A | Discharge status: Home / Self Care | Payer: Medicaid Other | Source: Ambulatory Visit | Attending: Physician Assistant | Admitting: Physician Assistant

## 2019-09-01 ENCOUNTER — Other Ambulatory Visit: Payer: Self-pay

## 2019-09-01 DIAGNOSIS — Z1231 Encounter for screening mammogram for malignant neoplasm of breast: Secondary | ICD-10-CM

## 2019-09-01 MED FILL — QUETIAPINE FUMARATE 50 MG T: 50 | 30 days supply | Qty: 30 | Fill #1

## 2019-09-11 MED FILL — FLUCONAZOLE 150 MG TABS: 150 | 3 days supply | Qty: 3 | Fill #0

## 2019-09-19 MED FILL — LABETALOL HCL 200 MG TABLET: 200 | 30 days supply | Qty: 30 | Fill #2

## 2019-09-19 MED FILL — tiZANidine HCL 4 MG TABS: 4 | 30 days supply | Qty: 30 | Fill #3

## 2019-09-19 MED FILL — methIMAzole 10 MG TABS: 10 | 30 days supply | Qty: 60 | Fill #1

## 2019-09-23 MED FILL — HYDROCORTISONE-PRAMOXINE CR: 2.5-1 | 28 days supply | Qty: 57 | Fill #0

## 2019-09-23 MED FILL — BELBUCA 600 MCG FILM: 600 | 30 days supply | Qty: 60 | Fill #0

## 2019-09-23 MED FILL — LORAZEPAM 1 MG TABS: 1 | 30 days supply | Qty: 60 | Fill #0

## 2019-09-29 MED FILL — AMLODIPINE BESYLATE 10 MG T: 10 | 30 days supply | Qty: 30 | Fill #5

## 2019-09-29 MED FILL — QUETIAPINE FUMARATE 50 MG T: 50 | 30 days supply | Qty: 30 | Fill #2

## 2019-09-29 MED FILL — VENLAFAXINE HCL ER 150 MG C: 150 | 30 days supply | Qty: 30 | Fill #1

## 2019-09-29 MED FILL — HYDROXYZINE PAM 25 MG CAP: 25 | 30 days supply | Qty: 90 | Fill #2

## 2019-10-16 MED FILL — methIMAzole 10 MG TABS: 10 | 30 days supply | Qty: 60 | Fill #2

## 2019-10-16 MED FILL — VIT D2 1.25 MG (50,000 UNIT: 1.25 MG | 28 days supply | Qty: 4 | Fill #1

## 2019-10-16 MED FILL — LABETALOL HCL 200 MG TABS: 200 | 30 days supply | Qty: 30 | Fill #3

## 2019-10-17 MED FILL — tiZANidine HCL 4 MG TABS: 4 | 30 days supply | Qty: 30 | Fill #0

## 2019-10-21 MED FILL — BELBUCA 600 MCG FILM: 600 | 30 days supply | Qty: 60 | Fill #0

## 2019-10-22 MED FILL — LORAZEPAM 1 MG TABS: 1 | 30 days supply | Qty: 60 | Fill #0

## 2019-10-22 MED FILL — HYDROCORTISONE-PRAMOXINE CR: 2.5-1 | 28 days supply | Qty: 57 | Fill #1

## 2019-10-27 MED FILL — AMLODIPINE BESYLATE 10 MG T: 10 | 30 days supply | Qty: 30 | Fill #0

## 2019-10-27 MED FILL — QUETIAPINE FUMARATE 50 MG T: 50 | 30 days supply | Qty: 30 | Fill #3

## 2019-10-27 MED FILL — VENLAFAXINE HCL ER 150 MG C: 150 | 30 days supply | Qty: 30 | Fill #2

## 2019-10-27 MED FILL — HYDROXYZINE PAM 25 MG CAP: 25 | 30 days supply | Qty: 90 | Fill #0

## 2019-11-13 MED FILL — LABETALOL HCL 200 MG TABS: 200 | 30 days supply | Qty: 30 | Fill #4

## 2019-11-13 MED FILL — VIT D2 1.25 MG (50,000 UNIT: 1.25 MG | 28 days supply | Qty: 4 | Fill #2

## 2019-11-13 MED FILL — tiZANidine HCL 4 MG TABS: 4 | 30 days supply | Qty: 30 | Fill #1

## 2019-11-18 MED FILL — LORAZEPAM 1 MG TABS: 1 | 30 days supply | Qty: 60 | Fill #0

## 2019-11-19 MED FILL — BELBUCA 600 MCG FILM: 600 | 30 days supply | Qty: 60 | Fill #0

## 2019-11-19 MED FILL — AMLODIPINE BESYLATE 10 MG T: 10 | 30 days supply | Qty: 30 | Fill #1

## 2019-11-19 MED FILL — HYDROXYZINE PAM 25 MG CAP: 25 | 30 days supply | Qty: 90 | Fill #1

## 2019-11-19 MED FILL — QUETIAPINE FUMARATE 50 MG T: 50 | 30 days supply | Qty: 30 | Fill #4

## 2019-11-19 MED FILL — VENLAFAXINE HCL ER 150 MG C: 150 | 30 days supply | Qty: 30 | Fill #3

## 2019-12-08 MED FILL — QUETIAPINE FUMARATE 50 MG T: 50 | 90 days supply | Qty: 180 | Fill #0

## 2019-12-08 MED FILL — PROPRANOLOL 20 MG TABLET: 20 | 30 days supply | Qty: 60 | Fill #0

## 2019-12-09 MED FILL — hydrALAZINE HCL 50 MG TABS: 50 | 30 days supply | Qty: 60 | Fill #0

## 2019-12-16 MED FILL — methIMAzole 10 MG TABS: 10 | 30 days supply | Qty: 60 | Fill #0

## 2019-12-19 MED FILL — tiZANidine HCL 4 MG TABS: 4 | 30 days supply | Qty: 30 | Fill #2

## 2019-12-19 MED FILL — HYDROXYZINE PAM 25 MG CAP: 25 | 30 days supply | Qty: 90 | Fill #2

## 2019-12-19 MED FILL — VENLAFAXINE HCL ER 150 MG C: 150 | 30 days supply | Qty: 30 | Fill #0

## 2019-12-19 MED FILL — VIT D2 1.25 MG (50,000 UNIT: 1.25 MG | 28 days supply | Qty: 4 | Fill #3

## 2019-12-19 MED FILL — LORAZEPAM 1 MG TABS: 1 | 30 days supply | Qty: 60 | Fill #0

## 2019-12-19 MED FILL — BELBUCA 600 MCG FILM: 600 | 30 days supply | Qty: 60 | Fill #0

## 2020-01-06 MED FILL — PROPRANOLOL 20 MG TABLET: 20 | 30 days supply | Qty: 60 | Fill #0

## 2020-01-09 MED FILL — VIT D2 1.25 MG (50,000 UNIT: 1.25 MG | 28 days supply | Qty: 4 | Fill #0

## 2020-01-12 MED FILL — HYDROXYZINE PAM 25 MG CAP: 25 | 30 days supply | Qty: 90 | Fill #0

## 2020-01-16 MED FILL — VENLAFAXINE HCL ER 150 MG C: 150 | 30 days supply | Qty: 30 | Fill #0

## 2020-01-16 MED FILL — methIMAzole 10 MG TABS: 10 | 30 days supply | Qty: 60 | Fill #1

## 2020-01-16 MED FILL — tiZANidine HCL 4 MG TABS: 4 | 30 days supply | Qty: 30 | Fill #3

## 2020-01-16 MED FILL — hydrALAZINE HCL 50 MG TABS: 50 | 30 days supply | Qty: 60 | Fill #1

## 2020-01-16 MED FILL — BELBUCA 600 MCG FILM: 600 | 30 days supply | Qty: 60 | Fill #0

## 2020-01-19 MED FILL — LORAZEPAM 1 MG TABS: 1 | 30 days supply | Qty: 60 | Fill #0

## 2020-01-20 MED FILL — SUPREP BOWEL PREP KIT: 17.5-3.13-1 | 1 days supply | Qty: 355 | Fill #0

## 2020-02-12 MED FILL — PROPRANOLOL 20 MG TABLET: 20 | 30 days supply | Qty: 60 | Fill #1

## 2020-02-12 MED FILL — VIT D2 1.25 MG (50,000 UNIT: 1.25 MG | 28 days supply | Qty: 4 | Fill #1

## 2020-02-12 MED FILL — hydrALAZINE HCL 50 MG TABS: 50 | 30 days supply | Qty: 60 | Fill #2

## 2020-02-12 MED FILL — HYDROXYZINE PAM 25 MG CAP: 25 | 30 days supply | Qty: 90 | Fill #1

## 2020-02-12 MED FILL — VENLAFAXINE HCL ER 150 MG C: 150 | 30 days supply | Qty: 30 | Fill #1

## 2020-02-12 MED FILL — methIMAzole 10 MG TABS: 10 | 30 days supply | Qty: 60 | Fill #2

## 2020-02-13 MED FILL — tiZANidine HCL 4 MG TABS: 4 | 30 days supply | Qty: 30 | Fill #0

## 2020-02-16 MED FILL — LORAZEPAM 1 MG TABS: 1 | 30 days supply | Qty: 60 | Fill #1

## 2020-02-18 MED FILL — BELBUCA 600 MCG FILM: 600 | 30 days supply | Qty: 60 | Fill #0

## 2020-03-02 MED FILL — VENLAFAXINE HCL 25 MG TAB: 25 | 30 days supply | Qty: 30 | Fill #0

## 2020-03-09 MED FILL — hydrALAZINE HCL 50 MG TABS: 50 | 30 days supply | Qty: 60 | Fill #3

## 2020-03-09 MED FILL — VENLAFAXINE HCL ER 150 MG C: 150 | 30 days supply | Qty: 30 | Fill #0

## 2020-03-09 MED FILL — PROPRANOLOL 20 MG TABLET: 20 | 30 days supply | Qty: 60 | Fill #0

## 2020-03-09 MED FILL — VIT D2 1.25 MG (50,000 UNIT: 1.25 MG | 28 days supply | Qty: 4 | Fill #2

## 2020-03-09 MED FILL — tiZANidine HCL 4 MG TABS: 4 | 30 days supply | Qty: 30 | Fill #1

## 2020-03-09 MED FILL — QUETIAPINE FUMARATE 50 MG T: 50 | 90 days supply | Qty: 180 | Fill #0

## 2020-03-09 MED FILL — HYDROXYZINE PAM 25 MG CAP: 25 | 30 days supply | Qty: 90 | Fill #2

## 2020-03-18 MED FILL — LORAZEPAM 1 MG TABS: 1 | 30 days supply | Qty: 60 | Fill #0

## 2020-03-18 MED FILL — methIMAzole 10 MG TABS: 10 | 30 days supply | Qty: 60 | Fill #0

## 2020-03-19 MED FILL — BELBUCA 600 MCG FILM: 600 | 30 days supply | Qty: 60 | Fill #0

## 2020-04-07 MED FILL — VENLAFAXINE HCL 25 MG TAB: 25 | 30 days supply | Qty: 30 | Fill #0

## 2020-04-07 MED FILL — VENLAFAXINE HCL ER 150 MG C: 150 | 30 days supply | Qty: 30 | Fill #1

## 2020-04-07 MED FILL — tiZANidine HCL 4 MG TABS: 4 | 30 days supply | Qty: 30 | Fill #2

## 2020-04-07 MED FILL — PROPRANOLOL 20 MG TABLET: 20 | 30 days supply | Qty: 60 | Fill #1

## 2020-04-07 MED FILL — hydrALAZINE HCL 50 MG TABS: 50 | 30 days supply | Qty: 60 | Fill #4

## 2020-04-07 MED FILL — VIT D2 1.25 MG (50,000 UNIT: 1.25 MG | 28 days supply | Qty: 4 | Fill #3

## 2020-04-14 MED FILL — HYDROXYZINE PAM 25 MG CAP: 25 | 30 days supply | Qty: 90 | Fill #0

## 2020-04-14 MED FILL — LORazepam 1 MG TABS: 1 | 30 days supply | Qty: 60 | Fill #1

## 2020-04-16 MED FILL — BELBUCA 600 MCG FILM: 600 | 30 days supply | Qty: 60 | Fill #0

## 2020-05-03 MED FILL — PROPRANOLOL 20 MG TABLET: 20 | 30 days supply | Qty: 60 | Fill #0

## 2020-05-03 MED FILL — hydrALAZINE HCL 50 MG TABS: 50 | 30 days supply | Qty: 60 | Fill #5

## 2020-05-03 MED FILL — VIT D2 1.25 MG (50,000 UNIT: 1.25 MG | 28 days supply | Qty: 4 | Fill #0

## 2020-05-03 MED FILL — VENLAFAXINE HCL ER 150 MG C: 150 | 30 days supply | Qty: 30 | Fill #0

## 2020-05-03 MED FILL — VENLAFAXINE HCL 25 MG TAB: 25 | 30 days supply | Qty: 30 | Fill #1

## 2020-05-03 MED FILL — tiZANidine HCL 4 MG TABS: 4 | 30 days supply | Qty: 30 | Fill #3

## 2020-05-11 MED FILL — BELBUCA 600 MCG FILM: 600 | 30 days supply | Qty: 60 | Fill #0

## 2020-05-14 MED FILL — LORazepam 1 MG TABS: 1 | 30 days supply | Qty: 60 | Fill #0

## 2020-05-14 MED FILL — methIMAzole 10 MG TABS: 10 | 30 days supply | Qty: 60 | Fill #1

## 2020-06-03 ENCOUNTER — Other Ambulatory Visit (HOSPITAL_BASED_OUTPATIENT_CLINIC_OR_DEPARTMENT_OTHER): Payer: Self-pay | Admitting: Nephrology

## 2020-06-03 MED FILL — tiZANidine HCL 4 MG TABS: 4 | 30 days supply | Qty: 30 | Fill #0

## 2020-06-03 MED FILL — VENLAFAXINE HCL 25 MG TAB: 25 | 30 days supply | Qty: 30 | Fill #0

## 2020-06-03 MED FILL — PROPRANOLOL 20 MG TABLET: 20 | 30 days supply | Qty: 60 | Fill #1

## 2020-06-03 MED FILL — hydrALAZINE HCL 50 MG TABS: 50 | 30 days supply | Qty: 60 | Fill #0

## 2020-06-03 MED FILL — VENLAFAXINE HCL ER 150 MG C: 150 | 30 days supply | Qty: 30 | Fill #0

## 2020-06-04 MED FILL — KETOCONAZOLE 2% CREAM: 2 | 14 days supply | Qty: 60 | Fill #0

## 2020-06-04 MED FILL — VIT D2 1.25 MG (50,000 UNIT: 1.25 MG | 28 days supply | Qty: 4 | Fill #1

## 2020-06-04 MED FILL — QUETIAPINE FUMARATE 50 MG T: 50 | 90 days supply | Qty: 180 | Fill #0

## 2020-06-04 MED FILL — FLUCONAZOLE 150 MG TABS: 150 | 3 days supply | Qty: 3 | Fill #0

## 2020-06-11 MED FILL — LORAZEPAM 1 MG TABS: 1 | 30 days supply | Qty: 60 | Fill #1

## 2020-06-11 MED FILL — BELBUCA 600 MCG FILM: 600 | 30 days supply | Qty: 60 | Fill #0

## 2020-07-06 MED FILL — BELBUCA 600 MCG FILM: 600 | 30 days supply | Qty: 60 | Fill #0

## 2020-07-06 MED FILL — tiZANidine HCL 4 MG TABS: 4 | 30 days supply | Qty: 30 | Fill #1

## 2020-07-06 MED FILL — VIT D2 1.25 MG (50,000 UNIT: 1.25 MG | 28 days supply | Qty: 4 | Fill #2

## 2020-07-06 MED FILL — VENLAFAXINE HCL 25 MG TAB: 25 | 30 days supply | Qty: 30 | Fill #1

## 2020-07-06 MED FILL — HYDROXYZINE PAM 25 MG CAP: 25 | 30 days supply | Qty: 90 | Fill #1

## 2020-07-06 MED FILL — PROPRANOLOL 20 MG TABLET: 20 | 30 days supply | Qty: 60 | Fill #0

## 2020-07-06 MED FILL — hydrALAZINE HCL 50 MG TABS: 50 | 30 days supply | Qty: 60 | Fill #1

## 2020-07-06 MED FILL — VENLAFAXINE HCL ER 150 MG C: 150 | 30 days supply | Qty: 30 | Fill #1

## 2020-07-06 MED FILL — methIMAzole 10 MG TABS: 10 | 30 days supply | Qty: 60 | Fill #2

## 2020-07-13 MED FILL — LORAZEPAM 1 MG TABS: 1 | 30 days supply | Qty: 60 | Fill #0

## 2020-07-14 MED FILL — HYDROXYZINE PAM 25 MG CAP: 25 | 30 days supply | Qty: 90 | Fill #1

## 2020-07-14 MED FILL — METRONIDAZOLE 500 MG TABS: 500 | 7 days supply | Qty: 14 | Fill #0

## 2020-07-30 MED FILL — METRONIDAZOLE 500 MG TABS: 500 | 7 days supply | Qty: 14 | Fill #0

## 2020-08-02 MED FILL — tiZANidine HCL 4 MG TABS: 4 | 30 days supply | Qty: 30 | Fill #2

## 2020-08-02 MED FILL — PROPRANOLOL 20 MG TABLET: 20 | 30 days supply | Qty: 60 | Fill #1

## 2020-08-02 MED FILL — VIT D2 1.25 MG (50,000 UNIT: 1.25 MG | 28 days supply | Qty: 4 | Fill #3

## 2020-08-02 MED FILL — VENLAFAXINE HCL ER 150 MG C: 150 | 30 days supply | Qty: 30 | Fill #0

## 2020-08-02 MED FILL — VENLAFAXINE HCL 25 MG TAB: 25 | 30 days supply | Qty: 30 | Fill #0

## 2020-08-03 ENCOUNTER — Other Ambulatory Visit (HOSPITAL_BASED_OUTPATIENT_CLINIC_OR_DEPARTMENT_OTHER): Payer: Self-pay | Admitting: Internal Medicine

## 2020-08-03 MED FILL — BELBUCA 600 MCG FILM: 600 | 7 days supply | Qty: 14 | Fill #0

## 2020-08-03 MED FILL — methIMAzole 10 MG TABS: 10 | 30 days supply | Qty: 30 | Fill #0

## 2020-08-09 MED FILL — TERCONAZOLE 80 MG SUPP: 80 | 3 days supply | Qty: 3 | Fill #0

## 2020-08-10 MED FILL — LORAZEPAM 1 MG TABS: 1 | 30 days supply | Qty: 60 | Fill #1

## 2020-08-10 MED FILL — BELBUCA 600 MCG FILM: 600 | 23 days supply | Qty: 46 | Fill #1

## 2020-08-12 MED FILL — HYDROXYZINE PAM 25 MG CAP: 25 | 30 days supply | Qty: 90 | Fill #2

## 2020-08-12 MED FILL — hydrALAZINE HCL 50 MG TABS: 50 | 30 days supply | Qty: 60 | Fill #2

## 2020-08-31 MED FILL — QUETIAPINE FUMARATE 50 MG T: 50 | 90 days supply | Qty: 180 | Fill #0

## 2020-08-31 MED FILL — tiZANidine HCL 4 MG TABS: 4 | 30 days supply | Qty: 30 | Fill #0

## 2020-08-31 MED FILL — VENLAFAXINE HCL 25 MG TAB: 25 | 30 days supply | Qty: 30 | Fill #1

## 2020-08-31 MED FILL — VENLAFAXINE HCL ER 150 MG C: 150 | 30 days supply | Qty: 30 | Fill #1

## 2020-08-31 MED FILL — methIMAzole 10 MG TABS: 10 | 30 days supply | Qty: 30 | Fill #1

## 2020-09-07 MED FILL — MORPHINE SULF ER 15 MG TAB: 15 | 30 days supply | Qty: 60 | Fill #0

## 2020-09-10 MED FILL — LORazepam 1 MG TABS: 1 | 30 days supply | Qty: 60 | Fill #0

## 2020-09-14 ENCOUNTER — Other Ambulatory Visit (HOSPITAL_BASED_OUTPATIENT_CLINIC_OR_DEPARTMENT_OTHER): Payer: Self-pay | Admitting: Internal Medicine

## 2020-09-14 MED FILL — VIT D2 1.25 MG (50,000 UNIT: 1.25 MG | 28 days supply | Qty: 4 | Fill #0

## 2020-09-14 MED FILL — hydrALAZINE HCL 50 MG TABS: 50 | 30 days supply | Qty: 60 | Fill #3

## 2020-09-14 MED FILL — PROPRANOLOL 20 MG TABLET: 20 | 90 days supply | Qty: 180 | Fill #0

## 2020-09-21 ENCOUNTER — Other Ambulatory Visit (HOSPITAL_BASED_OUTPATIENT_CLINIC_OR_DEPARTMENT_OTHER): Payer: Self-pay | Admitting: Internal Medicine

## 2020-09-21 MED FILL — HYDROXYZINE PAM 25 MG CAP: 25 | 30 days supply | Qty: 90 | Fill #0

## 2020-09-21 MED FILL — QUETIAPINE FUMARATE 100 MG: 100 | 90 days supply | Qty: 180 | Fill #0

## 2020-09-21 MED FILL — PERMETHRIN 5 % CREA: 5 | 30 days supply | Qty: 60 | Fill #0

## 2020-09-29 MED FILL — tiZANidine HCL 4 MG TABS: 4 | 30 days supply | Qty: 30 | Fill #1

## 2020-09-29 MED FILL — methIMAzole 10 MG TABS: 10 | 30 days supply | Qty: 30 | Fill #2

## 2020-09-29 MED FILL — VENLAFAXINE HCL ER 150 MG C: 150 | 90 days supply | Qty: 90 | Fill #0

## 2020-09-29 MED FILL — VENLAFAXINE HCL 25 MG TAB: 25 | 90 days supply | Qty: 90 | Fill #0

## 2020-10-05 ENCOUNTER — Other Ambulatory Visit (HOSPITAL_BASED_OUTPATIENT_CLINIC_OR_DEPARTMENT_OTHER): Payer: Self-pay | Admitting: Internal Medicine

## 2020-10-05 MED FILL — MORPHINE SULF ER 15 MG TAB: 15 | 30 days supply | Qty: 60 | Fill #0

## 2020-10-05 MED FILL — PERMETHRIN 5 % CREA: 5 | 7 days supply | Qty: 60 | Fill #0

## 2020-10-15 MED FILL — LORazepam 1 MG TABS: 1 | 30 days supply | Qty: 60 | Fill #1

## 2020-10-15 MED FILL — VIT D2 1.25 MG (50,000 UNIT: 1.25 MG | 28 days supply | Qty: 4 | Fill #1

## 2020-10-15 MED FILL — hydrALAZINE HCL 50 MG TABS: 50 | 30 days supply | Qty: 60 | Fill #4

## 2020-11-02 ENCOUNTER — Other Ambulatory Visit (HOSPITAL_BASED_OUTPATIENT_CLINIC_OR_DEPARTMENT_OTHER): Payer: Self-pay | Admitting: Internal Medicine

## 2020-11-02 MED FILL — MORPHINE SULF ER 15 MG TAB: 15 | 30 days supply | Qty: 60 | Fill #0

## 2020-11-09 ENCOUNTER — Other Ambulatory Visit (HOSPITAL_BASED_OUTPATIENT_CLINIC_OR_DEPARTMENT_OTHER): Payer: Self-pay | Admitting: Internal Medicine

## 2020-11-15 MED FILL — LORazepam 1 MG TABS: 1 | 30 days supply | Qty: 60 | Fill #0

## 2020-11-15 MED FILL — HYDROXYZINE PAM 25 MG CAP: 25 | 30 days supply | Qty: 90 | Fill #1

## 2020-11-15 MED FILL — VIT D2 1.25 MG (50,000 UNIT: 1.25 MG | 28 days supply | Qty: 4 | Fill #2

## 2020-11-15 MED FILL — hydrALAZINE HCL 50 MG TABS: 50 | 30 days supply | Qty: 60 | Fill #5

## 2020-11-22 ENCOUNTER — Other Ambulatory Visit (HOSPITAL_BASED_OUTPATIENT_CLINIC_OR_DEPARTMENT_OTHER): Payer: Self-pay | Admitting: Dermatology

## 2020-11-22 MED FILL — GABAPENTIN 100 MG CAPSULE: 100 | 30 days supply | Qty: 30 | Fill #0

## 2020-11-25 MED FILL — TRIAMCIN 0.1%CR:SSD 1:1: 30 days supply | Qty: 440 | Fill #0

## 2020-11-30 ENCOUNTER — Other Ambulatory Visit (HOSPITAL_BASED_OUTPATIENT_CLINIC_OR_DEPARTMENT_OTHER): Payer: Self-pay | Admitting: Internal Medicine

## 2020-11-30 MED FILL — MORPHINE SULF ER 15 MG TAB: 15 | 30 days supply | Qty: 60 | Fill #0

## 2020-12-14 ENCOUNTER — Other Ambulatory Visit (HOSPITAL_BASED_OUTPATIENT_CLINIC_OR_DEPARTMENT_OTHER): Payer: Self-pay | Admitting: Nephrology

## 2020-12-14 MED FILL — hydrALAZINE HCL 50 MG TABS: 50 | 30 days supply | Qty: 60 | Fill #0

## 2020-12-14 MED FILL — LORazepam 1 MG TABS: 1 | 30 days supply | Qty: 60 | Fill #1

## 2020-12-14 MED FILL — VIT D2 1.25 MG (50,000 UNIT: 1.25 MG | 28 days supply | Qty: 4 | Fill #3

## 2020-12-14 MED FILL — PROPRANOLOL 20 MG TABLET: 20 | 90 days supply | Qty: 180 | Fill #0

## 2020-12-14 MED FILL — QUETIAPINE FUMARATE 100 MG: 100 | 90 days supply | Qty: 180 | Fill #0

## 2020-12-14 MED FILL — HYDROXYZINE PAM 25 MG CAP: 25 | 30 days supply | Qty: 90 | Fill #2

## 2020-12-14 MED FILL — GABAPENTIN 100 MG CAPSULE: 100 | 30 days supply | Qty: 30 | Fill #1

## 2020-12-15 MED FILL — MORPHINE SULF ER 15 MG TAB: 15 | 30 days supply | Qty: 60 | Fill #0

## 2020-12-15 MED FILL — QUETIAPINE FUMARATE 50 MG T: 50 | 90 days supply | Qty: 180 | Fill #0

## 2021-01-03 ENCOUNTER — Other Ambulatory Visit (HOSPITAL_BASED_OUTPATIENT_CLINIC_OR_DEPARTMENT_OTHER): Payer: Self-pay | Admitting: Internal Medicine

## 2021-01-04 ENCOUNTER — Other Ambulatory Visit (HOSPITAL_BASED_OUTPATIENT_CLINIC_OR_DEPARTMENT_OTHER): Payer: Self-pay | Admitting: Internal Medicine

## 2021-01-11 ENCOUNTER — Other Ambulatory Visit (HOSPITAL_BASED_OUTPATIENT_CLINIC_OR_DEPARTMENT_OTHER): Payer: Self-pay | Admitting: Dermatology

## 2021-01-11 MED FILL — GABAPENTIN 100 MG CAPSULE: 100 | 30 days supply | Qty: 30 | Fill #0

## 2021-01-11 MED FILL — TRIAMCINOLONE 0.1% OINTMENT: 0.1 | 30 days supply | Qty: 454 | Fill #0

## 2021-01-11 MED FILL — HYDROXYZINE PAM 25 MG CAP: 25 | 30 days supply | Qty: 90 | Fill #0

## 2021-01-17 ENCOUNTER — Other Ambulatory Visit (HOSPITAL_BASED_OUTPATIENT_CLINIC_OR_DEPARTMENT_OTHER): Payer: Self-pay | Admitting: Internal Medicine

## 2021-01-17 MED FILL — hydrALAZINE HCL 50 MG TABS: 50 | 30 days supply | Qty: 60 | Fill #1

## 2021-01-17 MED FILL — VIT D2 1.25 MG (50,000 UNIT: 1.25 MG | 28 days supply | Qty: 4 | Fill #0

## 2021-01-17 MED FILL — LORazepam 1 MG TABS: 1 | 30 days supply | Qty: 60 | Fill #0

## 2021-01-26 MED FILL — MORPHINE SULF ER 15 MG TAB: 15 | 30 days supply | Qty: 60 | Fill #0

## 2021-01-27 ENCOUNTER — Other Ambulatory Visit: Payer: Self-pay | Admitting: Physician Assistant

## 2021-01-27 DIAGNOSIS — Z1231 Encounter for screening mammogram for malignant neoplasm of breast: Secondary | ICD-10-CM

## 2021-01-28 ENCOUNTER — Other Ambulatory Visit (HOSPITAL_BASED_OUTPATIENT_CLINIC_OR_DEPARTMENT_OTHER): Payer: Self-pay

## 2021-01-28 MED FILL — OMEPRAZOLE 40 MG CPDR: 40 | 90 days supply | Qty: 90 | Fill #0

## 2021-02-02 ENCOUNTER — Other Ambulatory Visit (HOSPITAL_BASED_OUTPATIENT_CLINIC_OR_DEPARTMENT_OTHER): Payer: Self-pay | Admitting: Internal Medicine

## 2021-02-10 MED FILL — hydrALAZINE HCL 50 MG TABS: 50 | 30 days supply | Qty: 60 | Fill #2

## 2021-02-10 MED FILL — GABAPENTIN 100 MG CAPSULE: 100 | 30 days supply | Qty: 90 | Fill #1

## 2021-02-10 MED FILL — TRIAMCINOLONE 0.1% OINTMENT: 0.1 | 30 days supply | Qty: 454 | Fill #1

## 2021-02-10 MED FILL — HYDROXYZINE PAMOATE 25 MG C: 25 | 30 days supply | Qty: 90 | Fill #1

## 2021-02-10 MED FILL — VIT D2 1.25 MG (50,000 UNIT: 1.25 MG | 28 days supply | Qty: 4 | Fill #1

## 2021-02-14 MED FILL — LORAZEPAM 1 MG TABS: 1 | 30 days supply | Qty: 60 | Fill #1

## 2021-02-17 MED FILL — MORPHINE SULF ER 15 MG TAB: 15 | 30 days supply | Qty: 60 | Fill #0

## 2021-03-01 ENCOUNTER — Other Ambulatory Visit (HOSPITAL_BASED_OUTPATIENT_CLINIC_OR_DEPARTMENT_OTHER): Payer: Self-pay | Admitting: Internal Medicine

## 2021-03-04 ENCOUNTER — Other Ambulatory Visit (HOSPITAL_BASED_OUTPATIENT_CLINIC_OR_DEPARTMENT_OTHER): Payer: Self-pay | Admitting: Internal Medicine

## 2021-03-04 MED FILL — MORPHINE SULF ER 15 MG TAB: 15 | 30 days supply | Qty: 60 | Fill #0

## 2021-03-11 ENCOUNTER — Other Ambulatory Visit (HOSPITAL_BASED_OUTPATIENT_CLINIC_OR_DEPARTMENT_OTHER): Payer: Self-pay | Admitting: Dermatology

## 2021-03-11 MED FILL — TACROLIMUS 0.1 % OINT: 0.1 | 30 days supply | Qty: 30 | Fill #0

## 2021-03-11 MED FILL — GABAPENTIN 100 MG CAPSULE: 100 | 30 days supply | Qty: 90 | Fill #2

## 2021-03-11 MED FILL — VENLAFAXINE HCL ER 150 MG C: 150 | 90 days supply | Qty: 90 | Fill #0

## 2021-03-11 MED FILL — hydrALAZINE HCL 50 MG TABS: 50 | 30 days supply | Qty: 60 | Fill #3

## 2021-03-11 MED FILL — VENLAFAXINE HCL 25 MG TAB: 25 | 90 days supply | Qty: 90 | Fill #0

## 2021-03-11 MED FILL — PROPRANOLOL 20 MG TABLET: 20 | 90 days supply | Qty: 180 | Fill #0

## 2021-03-11 MED FILL — HYDROXYZINE PAMOATE 25 MG C: 25 | 30 days supply | Qty: 90 | Fill #2

## 2021-03-11 MED FILL — VIT D2 1.25 MG (50,000 UNIT: 1.25 MG | 28 days supply | Qty: 4 | Fill #2

## 2021-03-14 ENCOUNTER — Other Ambulatory Visit (HOSPITAL_BASED_OUTPATIENT_CLINIC_OR_DEPARTMENT_OTHER): Payer: Self-pay

## 2021-03-14 MED FILL — Quetiapine Fumarate Tab 100 MG: ORAL | 90 days supply | Qty: 180 | Fill #0 | Status: AC

## 2021-03-21 ENCOUNTER — Other Ambulatory Visit (HOSPITAL_BASED_OUTPATIENT_CLINIC_OR_DEPARTMENT_OTHER): Payer: Self-pay

## 2021-03-21 MED FILL — Lorazepam Tab 1 MG: ORAL | 30 days supply | Qty: 60 | Fill #0 | Status: AC

## 2021-03-21 MED FILL — Triamcinolone Acetonide Oint 0.1%: CUTANEOUS | 30 days supply | Qty: 454 | Fill #0 | Status: AC

## 2021-03-28 ENCOUNTER — Other Ambulatory Visit (HOSPITAL_BASED_OUTPATIENT_CLINIC_OR_DEPARTMENT_OTHER): Payer: Self-pay

## 2021-03-31 ENCOUNTER — Other Ambulatory Visit (HOSPITAL_BASED_OUTPATIENT_CLINIC_OR_DEPARTMENT_OTHER): Payer: Self-pay

## 2021-04-05 ENCOUNTER — Other Ambulatory Visit (HOSPITAL_BASED_OUTPATIENT_CLINIC_OR_DEPARTMENT_OTHER): Payer: Self-pay

## 2021-04-05 MED ORDER — HYDROMORPHONE HCL 4 MG PO TABS
ORAL_TABLET | ORAL | 0 refills | Status: DC
  Filled 2021-04-05: qty 60, 30d supply, fill #0

## 2021-04-11 ENCOUNTER — Other Ambulatory Visit (HOSPITAL_BASED_OUTPATIENT_CLINIC_OR_DEPARTMENT_OTHER): Payer: Self-pay

## 2021-04-11 MED FILL — Venlafaxine HCl Tab 25 MG (Base Equivalent): ORAL | 90 days supply | Qty: 90 | Fill #0 | Status: CN

## 2021-04-11 MED FILL — Tacrolimus Oint 0.1%: CUTANEOUS | 30 days supply | Qty: 30 | Fill #0 | Status: AC

## 2021-04-11 MED FILL — Propranolol HCl Tab 20 MG: ORAL | 90 days supply | Qty: 180 | Fill #0 | Status: CN

## 2021-04-11 MED FILL — Omeprazole Cap Delayed Release 40 MG: ORAL | 90 days supply | Qty: 90 | Fill #0 | Status: AC

## 2021-04-11 MED FILL — Gabapentin Cap 100 MG: ORAL | 30 days supply | Qty: 90 | Fill #0 | Status: AC

## 2021-04-11 MED FILL — Hydroxyzine Pamoate Cap 25 MG: ORAL | 30 days supply | Qty: 90 | Fill #0 | Status: AC

## 2021-04-11 MED FILL — Hydralazine HCl Tab 50 MG: ORAL | 30 days supply | Qty: 60 | Fill #0 | Status: AC

## 2021-04-12 ENCOUNTER — Other Ambulatory Visit (HOSPITAL_BASED_OUTPATIENT_CLINIC_OR_DEPARTMENT_OTHER): Payer: Self-pay

## 2021-04-20 ENCOUNTER — Other Ambulatory Visit (HOSPITAL_BASED_OUTPATIENT_CLINIC_OR_DEPARTMENT_OTHER): Payer: Self-pay

## 2021-04-20 MED FILL — Lorazepam Tab 1 MG: ORAL | 30 days supply | Qty: 60 | Fill #1 | Status: AC

## 2021-04-20 MED FILL — Triamcinolone Acetonide Oint 0.1%: CUTANEOUS | 30 days supply | Qty: 454 | Fill #1 | Status: AC

## 2021-05-10 ENCOUNTER — Other Ambulatory Visit (HOSPITAL_BASED_OUTPATIENT_CLINIC_OR_DEPARTMENT_OTHER): Payer: Self-pay

## 2021-05-10 MED ORDER — LORAZEPAM 1 MG PO TABS
1.0000 mg | ORAL_TABLET | Freq: Two times a day (BID) | ORAL | 1 refills | Status: DC | PRN
  Filled 2021-05-10: qty 60, 30d supply, fill #0

## 2021-05-10 MED ORDER — HYDROMORPHONE HCL 4 MG PO TABS
ORAL_TABLET | ORAL | 0 refills | Status: DC
  Filled 2021-05-10: qty 60, 30d supply, fill #0

## 2021-05-10 MED ORDER — LORAZEPAM 1 MG PO TABS: 1.0000 mg | ORAL_TABLET | Freq: Two times a day (BID) | ORAL | 0 refills | Status: DC | PRN

## 2021-05-11 ENCOUNTER — Other Ambulatory Visit (HOSPITAL_BASED_OUTPATIENT_CLINIC_OR_DEPARTMENT_OTHER): Payer: Self-pay

## 2021-05-18 ENCOUNTER — Other Ambulatory Visit (HOSPITAL_BASED_OUTPATIENT_CLINIC_OR_DEPARTMENT_OTHER): Payer: Self-pay

## 2021-05-18 MED FILL — Gabapentin Cap 100 MG: ORAL | 30 days supply | Qty: 90 | Fill #1 | Status: AC

## 2021-05-18 MED FILL — Hydralazine HCl Tab 50 MG: ORAL | 30 days supply | Qty: 60 | Fill #1 | Status: AC

## 2021-05-18 MED FILL — Hydroxyzine Pamoate Cap 25 MG: ORAL | 30 days supply | Qty: 90 | Fill #1 | Status: AC

## 2021-05-18 MED FILL — Propranolol HCl Tab 20 MG: ORAL | 90 days supply | Qty: 180 | Fill #0 | Status: AC

## 2021-05-19 ENCOUNTER — Other Ambulatory Visit (HOSPITAL_BASED_OUTPATIENT_CLINIC_OR_DEPARTMENT_OTHER): Payer: Self-pay

## 2021-05-20 ENCOUNTER — Other Ambulatory Visit (HOSPITAL_BASED_OUTPATIENT_CLINIC_OR_DEPARTMENT_OTHER): Payer: Self-pay

## 2021-05-20 MED ORDER — LORAZEPAM 1 MG PO TABS
1.0000 mg | ORAL_TABLET | Freq: Two times a day (BID) | ORAL | 0 refills | Status: DC | PRN
  Filled 2021-05-20: qty 10, 5d supply, fill #0

## 2021-05-25 ENCOUNTER — Other Ambulatory Visit (HOSPITAL_BASED_OUTPATIENT_CLINIC_OR_DEPARTMENT_OTHER): Payer: Self-pay

## 2021-05-25 MED ORDER — LORAZEPAM 1 MG PO TABS
1.0000 mg | ORAL_TABLET | Freq: Two times a day (BID) | ORAL | 2 refills | Status: DC | PRN
  Filled 2021-05-25 (×2): qty 60, 30d supply, fill #0
  Filled 2021-06-21 – 2021-06-22 (×2): qty 60, 30d supply, fill #1

## 2021-05-25 MED ORDER — VENLAFAXINE HCL 25 MG PO TABS
ORAL_TABLET | ORAL | 0 refills | Status: DC
  Filled 2021-06-29 – 2021-11-01 (×2): qty 90, 90d supply, fill #0

## 2021-05-25 MED ORDER — VENLAFAXINE HCL ER 150 MG PO CP24: ORAL_CAPSULE | ORAL | 0 refills | Status: DC

## 2021-05-25 MED ORDER — QUETIAPINE FUMARATE 100 MG PO TABS
ORAL_TABLET | ORAL | 0 refills | Status: DC
  Filled 2021-06-07: qty 180, 90d supply, fill #0

## 2021-05-25 MED ORDER — PROPRANOLOL HCL 20 MG PO TABS: ORAL_TABLET | ORAL | 0 refills | Status: DC

## 2021-06-07 ENCOUNTER — Other Ambulatory Visit (HOSPITAL_BASED_OUTPATIENT_CLINIC_OR_DEPARTMENT_OTHER): Payer: Self-pay

## 2021-06-07 MED ORDER — HYDROMORPHONE HCL 4 MG PO TABS
ORAL_TABLET | ORAL | 0 refills | Status: DC
  Filled 2021-06-07 – 2021-06-21 (×2): qty 60, 30d supply, fill #0

## 2021-06-07 MED ORDER — HYDROXYZINE PAMOATE 25 MG PO CAPS
ORAL_CAPSULE | ORAL | 2 refills | Status: DC
  Filled 2021-06-07: qty 90, 30d supply, fill #0

## 2021-06-07 MED ORDER — PROPRANOLOL HCL 40 MG PO TABS
40.0000 mg | ORAL_TABLET | Freq: Two times a day (BID) | ORAL | 3 refills | Status: DC
  Filled 2021-06-07: qty 180, 90d supply, fill #0

## 2021-06-10 ENCOUNTER — Other Ambulatory Visit (HOSPITAL_BASED_OUTPATIENT_CLINIC_OR_DEPARTMENT_OTHER): Payer: Self-pay

## 2021-06-14 ENCOUNTER — Other Ambulatory Visit (HOSPITAL_BASED_OUTPATIENT_CLINIC_OR_DEPARTMENT_OTHER): Payer: Self-pay

## 2021-06-20 ENCOUNTER — Other Ambulatory Visit (HOSPITAL_BASED_OUTPATIENT_CLINIC_OR_DEPARTMENT_OTHER): Payer: Self-pay

## 2021-06-21 ENCOUNTER — Other Ambulatory Visit (HOSPITAL_BASED_OUTPATIENT_CLINIC_OR_DEPARTMENT_OTHER): Payer: Self-pay

## 2021-06-21 MED ORDER — HYDRALAZINE HCL 50 MG PO TABS
50.0000 mg | ORAL_TABLET | Freq: Two times a day (BID) | ORAL | 5 refills | Status: DC
  Filled 2021-06-21: qty 60, 30d supply, fill #0
  Filled 2021-08-18: qty 60, 30d supply, fill #1
  Filled 2021-11-01: qty 60, 30d supply, fill #2
  Filled 2021-12-13: qty 60, 30d supply, fill #3
  Filled 2022-01-31: qty 60, 30d supply, fill #4
  Filled 2022-03-07 (×2): qty 60, 30d supply, fill #5

## 2021-06-21 MED FILL — Hydroxyzine Pamoate Cap 25 MG: ORAL | 30 days supply | Qty: 90 | Fill #2 | Status: AC

## 2021-06-22 ENCOUNTER — Observation Stay (HOSPITAL_COMMUNITY)
Admission: EM | Admit: 2021-06-22 | Discharge: 2021-06-23 | Disposition: A | Discharge status: Home / Self Care | Payer: Medicare Other | Attending: Internal Medicine | Admitting: Internal Medicine

## 2021-06-22 ENCOUNTER — Encounter (HOSPITAL_COMMUNITY): Payer: Self-pay | Admitting: Student

## 2021-06-22 ENCOUNTER — Emergency Department (HOSPITAL_COMMUNITY): Payer: Medicare Other

## 2021-06-22 ENCOUNTER — Other Ambulatory Visit: Payer: Self-pay

## 2021-06-22 ENCOUNTER — Observation Stay (HOSPITAL_COMMUNITY): Payer: Medicare Other

## 2021-06-22 ENCOUNTER — Emergency Department (HOSPITAL_BASED_OUTPATIENT_CLINIC_OR_DEPARTMENT_OTHER): Payer: Medicare Other

## 2021-06-22 ENCOUNTER — Other Ambulatory Visit (HOSPITAL_BASED_OUTPATIENT_CLINIC_OR_DEPARTMENT_OTHER): Payer: Self-pay

## 2021-06-22 DIAGNOSIS — R0602 Shortness of breath: Secondary | ICD-10-CM

## 2021-06-22 DIAGNOSIS — R042 Hemoptysis: Secondary | ICD-10-CM | POA: Diagnosis not present

## 2021-06-22 DIAGNOSIS — E876 Hypokalemia: Secondary | ICD-10-CM | POA: Diagnosis not present

## 2021-06-22 DIAGNOSIS — R0600 Dyspnea, unspecified: Secondary | ICD-10-CM

## 2021-06-22 DIAGNOSIS — E782 Mixed hyperlipidemia: Secondary | ICD-10-CM | POA: Diagnosis not present

## 2021-06-22 DIAGNOSIS — E039 Hypothyroidism, unspecified: Secondary | ICD-10-CM | POA: Diagnosis not present

## 2021-06-22 DIAGNOSIS — M79602 Pain in left arm: Secondary | ICD-10-CM

## 2021-06-22 DIAGNOSIS — Z86718 Personal history of other venous thrombosis and embolism: Secondary | ICD-10-CM | POA: Diagnosis not present

## 2021-06-22 DIAGNOSIS — Z79899 Other long term (current) drug therapy: Secondary | ICD-10-CM | POA: Insufficient documentation

## 2021-06-22 DIAGNOSIS — Z91040 Latex allergy status: Secondary | ICD-10-CM | POA: Diagnosis not present

## 2021-06-22 DIAGNOSIS — E0590 Thyrotoxicosis, unspecified without thyrotoxic crisis or storm: Secondary | ICD-10-CM | POA: Diagnosis not present

## 2021-06-22 DIAGNOSIS — E041 Nontoxic single thyroid nodule: Secondary | ICD-10-CM | POA: Insufficient documentation

## 2021-06-22 DIAGNOSIS — R000 Tachycardia, unspecified: Secondary | ICD-10-CM | POA: Diagnosis not present

## 2021-06-22 DIAGNOSIS — I82622 Acute embolism and thrombosis of deep veins of left upper extremity: Secondary | ICD-10-CM | POA: Diagnosis not present

## 2021-06-22 DIAGNOSIS — K219 Gastro-esophageal reflux disease without esophagitis: Secondary | ICD-10-CM | POA: Diagnosis present

## 2021-06-22 DIAGNOSIS — Z20822 Contact with and (suspected) exposure to covid-19: Secondary | ICD-10-CM | POA: Insufficient documentation

## 2021-06-22 DIAGNOSIS — E669 Obesity, unspecified: Secondary | ICD-10-CM | POA: Diagnosis present

## 2021-06-22 LAB — RESP PANEL BY RT-PCR (FLU A&B, COVID) ARPGX2
Influenza A by PCR: NEGATIVE
Influenza B by PCR: NEGATIVE
SARS Coronavirus 2 by RT PCR: NEGATIVE

## 2021-06-22 LAB — URINALYSIS, ROUTINE W REFLEX MICROSCOPIC
Bilirubin Urine: NEGATIVE
Glucose, UA: NEGATIVE mg/dL
Hgb urine dipstick: NEGATIVE
Ketones, ur: 5 mg/dL — AB
Leukocytes,Ua: NEGATIVE
Nitrite: NEGATIVE
Protein, ur: NEGATIVE mg/dL
Specific Gravity, Urine: 1.023 (ref 1.005–1.030)
pH: 5 (ref 5.0–8.0)

## 2021-06-22 LAB — RAPID URINE DRUG SCREEN, HOSP PERFORMED
Amphetamines: NOT DETECTED
Barbiturates: NOT DETECTED
Benzodiazepines: POSITIVE — AB
Cocaine: NOT DETECTED
Opiates: POSITIVE — AB
Tetrahydrocannabinol: POSITIVE — AB

## 2021-06-22 LAB — BASIC METABOLIC PANEL
Anion gap: 9 (ref 5–15)
BUN: 21 mg/dL (ref 8–23)
CO2: 25 mmol/L (ref 22–32)
Calcium: 9.1 mg/dL (ref 8.9–10.3)
Chloride: 106 mmol/L (ref 98–111)
Creatinine, Ser: 0.96 mg/dL (ref 0.44–1.00)
GFR, Estimated: >60 mL/min (ref >60)
Glucose, Bld: 109 mg/dL — ABNORMAL HIGH (ref 70–99)
Potassium: 3.4 mmol/L — ABNORMAL LOW (ref 3.5–5.1)
Sodium: 140 mmol/L (ref 135–145)

## 2021-06-22 LAB — CBC WITH DIFFERENTIAL/PLATELET
Abs Immature Granulocytes: 0.02 10*3/uL (ref 0.00–0.07)
Basophils Absolute: 0 10*3/uL (ref 0.0–0.1)
Basophils Relative: 0 %
Eosinophils Absolute: 0.1 10*3/uL (ref 0.0–0.5)
Eosinophils Relative: 2 %
HCT: 38.3 % (ref 36.0–46.0)
Hemoglobin: 11.6 g/dL — ABNORMAL LOW (ref 12.0–15.0)
Immature Granulocytes: 0 %
Lymphocytes Relative: 19 %
Lymphs Abs: 1.6 10*3/uL (ref 0.7–4.0)
MCH: 20.4 pg — ABNORMAL LOW (ref 26.0–34.0)
MCHC: 30.3 g/dL (ref 30.0–36.0)
MCV: 67.4 fL — ABNORMAL LOW (ref 80.0–100.0)
Monocytes Absolute: 0.9 10*3/uL (ref 0.1–1.0)
Monocytes Relative: 11 %
Neutro Abs: 5.5 10*3/uL (ref 1.7–7.7)
Neutrophils Relative %: 68 %
Platelets: 226 10*3/uL (ref 150–400)
RBC: 5.68 MIL/uL — ABNORMAL HIGH (ref 3.87–5.11)
RDW: 15 % (ref 11.5–15.5)
WBC: 8 10*3/uL (ref 4.0–10.5)
nRBC: 0 % (ref 0.0–0.2)

## 2021-06-22 LAB — MAGNESIUM: Magnesium: 1.6 mg/dL — ABNORMAL LOW (ref 1.7–2.4)

## 2021-06-22 LAB — D-DIMER, QUANTITATIVE: D-Dimer, Quant: 0.65 ug/mL-FEU — ABNORMAL HIGH (ref 0.00–0.50)

## 2021-06-22 LAB — TROPONIN I (HIGH SENSITIVITY)
Troponin I (High Sensitivity): 9 ng/L (ref <18)
Troponin I (High Sensitivity): 12 ng/L (ref <18)

## 2021-06-22 LAB — BRAIN NATRIURETIC PEPTIDE: B Natriuretic Peptide: 9.4 pg/mL (ref 0.0–100.0)

## 2021-06-22 LAB — T4, FREE: Free T4: 4.42 ng/dL — ABNORMAL HIGH (ref 0.61–1.12)

## 2021-06-22 LAB — TSH: TSH: <0.01 u[IU]/mL — ABNORMAL LOW (ref 0.350–4.500)

## 2021-06-22 MED ORDER — LORAZEPAM 1 MG PO TABS
1.0000 mg | ORAL_TABLET | Freq: Two times a day (BID) | ORAL | Status: DC
  Administered 2021-06-23: 1 mg via ORAL
  Filled 2021-06-22: qty 1

## 2021-06-22 MED ORDER — ALBUTEROL SULFATE (2.5 MG/3ML) 0.083% IN NEBU: 2.5000 mg | INHALATION_SOLUTION | RESPIRATORY_TRACT | Status: DC | PRN

## 2021-06-22 MED ORDER — SODIUM CHLORIDE 0.9 % IV SOLN
75.0000 mL/h | INTRAVENOUS | Status: DC
  Administered 2021-06-22: 75 mL/h via INTRAVENOUS

## 2021-06-22 MED ORDER — HYDROMORPHONE HCL 2 MG PO TABS
2.0000 mg | ORAL_TABLET | Freq: Two times a day (BID) | ORAL | Status: DC | PRN
  Administered 2021-06-23: 2 mg via ORAL
  Filled 2021-06-22: qty 1

## 2021-06-22 MED ORDER — POTASSIUM CHLORIDE CRYS ER 20 MEQ PO TBCR
40.0000 meq | EXTENDED_RELEASE_TABLET | Freq: Once | ORAL | Status: AC
  Administered 2021-06-22: 40 meq via ORAL
  Filled 2021-06-22: qty 2

## 2021-06-22 MED ORDER — TECHNETIUM TO 99M ALBUMIN AGGREGATED
4.3000 | Freq: Once | INTRAVENOUS | Status: AC
  Administered 2021-06-22: 4.3 via INTRAVENOUS

## 2021-06-22 MED ORDER — PROPRANOLOL HCL 20 MG PO TABS
40.0000 mg | ORAL_TABLET | Freq: Two times a day (BID) | ORAL | Status: DC
  Administered 2021-06-23: 40 mg via ORAL
  Filled 2021-06-22 (×2): qty 2

## 2021-06-22 MED ORDER — HYDRALAZINE HCL 25 MG PO TABS
50.0000 mg | ORAL_TABLET | Freq: Two times a day (BID) | ORAL | Status: DC
  Administered 2021-06-22 – 2021-06-23 (×2): 50 mg via ORAL
  Filled 2021-06-22 (×2): qty 2

## 2021-06-22 MED ORDER — QUETIAPINE FUMARATE 50 MG PO TABS
150.0000 mg | ORAL_TABLET | Freq: Every day | ORAL | Status: DC
  Administered 2021-06-22: 150 mg via ORAL
  Filled 2021-06-22: qty 1

## 2021-06-22 MED ORDER — SODIUM CHLORIDE 0.9 % IV BOLUS
1000.0000 mL | Freq: Once | INTRAVENOUS | Status: AC
  Administered 2021-06-22: 1000 mL via INTRAVENOUS

## 2021-06-22 MED ORDER — PROPRANOLOL HCL 20 MG PO TABS
40.0000 mg | ORAL_TABLET | Freq: Once | ORAL | Status: AC
  Administered 2021-06-22: 40 mg via ORAL

## 2021-06-22 MED ORDER — VENLAFAXINE HCL ER 37.5 MG PO CP24
175.0000 mg | ORAL_CAPSULE | Freq: Every day | ORAL | Status: DC
  Administered 2021-06-22: 187.5 mg via ORAL
  Filled 2021-06-22 (×3): qty 1

## 2021-06-22 MED ORDER — MAGNESIUM SULFATE IN D5W 1-5 GM/100ML-% IV SOLN
1.0000 g | Freq: Once | INTRAVENOUS | Status: AC
  Administered 2021-06-22: 1 g via INTRAVENOUS
  Filled 2021-06-22: qty 100

## 2021-06-22 MED ORDER — HYDROXYZINE HCL 25 MG PO TABS: 25.0000 mg | ORAL_TABLET | Freq: Three times a day (TID) | ORAL | Status: DC | PRN

## 2021-06-22 MED ORDER — PANTOPRAZOLE SODIUM 40 MG PO TBEC
40.0000 mg | DELAYED_RELEASE_TABLET | Freq: Every day | ORAL | Status: DC
  Administered 2021-06-22 – 2021-06-23 (×2): 40 mg via ORAL
  Filled 2021-06-22 (×2): qty 1

## 2021-06-22 MED ORDER — LORAZEPAM 1 MG PO TABS
1.0000 mg | ORAL_TABLET | Freq: Once | ORAL | Status: AC
  Administered 2021-06-22: 1 mg via ORAL
  Filled 2021-06-22: qty 1

## 2021-06-22 NOTE — H&P (Addendum)
Heather Hanson VOZ:[id number] DOB: [birth year] DOA: 06/22/2021   PCP: Karle Plumber, MD   Outpatient Specialists:     Pulmonology [facility] medical  Oncology  Dr.Kale GI Dr.  [facility]  Dr Berneice Heinrich  Patient arrived to ER on 06/22/21 at 1536 Referred by Attending Therisa Doyne, MD   Patient coming from: home Lives  With family    Chief Complaint:   Chief Complaint  Patient presents with   Chest Pain   Arm Pain    left   Tachycardia   Shortness of Breath    HPI: Heather Hanson is a 60 y.o. female with medical history significant of DVT of left arm 01/31/18, fibromyalgia 2014, GERD 2014, hereditary and idiopathic peripheral neuropathy 2016, hyperlipidemia, hypoglycemia, and two seizures.neurodermatitis.    Presented with   tachycardia she was at GI office to follow up on result of her EGD  Reported CP/SOB and LEft arm pain for 2y ever since her DVT She was up in elevator and just could not breath she had occsional episodes when  wakes up at night with her heart races This been like that since 2019 Supposed to have stress test next week Noted tachycardic up to 112 at GI office and ECG was done showing t wave inversions she was sent to ER  Hx of Upper ext DVT in 2019 she underwent an angiojet thrombectomy and TPA stay was complicated by significant clots within her bladder and renal failure requiring 2 dialysis episodes. She was started on Eliquis while an inpatient at the time No PE noted  08/01/18 Protein S Panel revealed all values WNL She was switched to xarelto It was stopped 2 y ago  Since 2019 she has been having palpitations  Reports she used to take synthroid but was told her thyroid was too high was changed to tapazole then stopped too  She takes Ativan 2 time a day like clockwork but has not had any today Her propranolol was increased 2 wks ago but they did not tell her why She have not had it yet  Pt reports for months she has been coughing up  some thick mucous with strings that look bloody to her. She states she has mentioned before. No coughing now, no active hemoptysis Pt did not want to have CT with contrast bc she is concerned that is what hurt her kidneys in the past    Drinks occasional wine, no tobacco  Has  been vaccinated against COVID     Initial COVID TEST  NEGATIVE   Lab Results  Component Value Date   SARSCOV2NAA NEGATIVE 06/22/2021     Regarding pertinent Chronic problems:    Hyperlipidemia - does not tolerate statins Lipid Panel     Component Value Date/Time   CHOL 296 (H) 03/30/2015 0949   TRIG 85.0 03/30/2015 0949   HDL 77.20 03/30/2015 0949   CHOLHDL 4 03/30/2015 0949   VLDL 17.0 03/30/2015 0949   LDLCALC 202 (H) 03/30/2015 0949    HTN on propranolol and hydralazine      obesity-   BMI Readings from Last 1 Encounters:  06/22/21 35.58 kg/m         Hx of DVT/PE on - no longer on anticoagulation     Palpitaions on inderal  While in ER: Noted to be tachycardic d.dimer 0.6 Doppler of left arm neg For DVT     ED Triage Vitals  Enc Vitals Group     BP 06/22/21 1555 Marland Kitchen)  153/96     Pulse Rate 06/22/21 1555 (!) 114     Resp 06/22/21 1555 16     Temp 06/22/21 1555 97.8 F (36.6 C)     Temp Source 06/22/21 1555 Oral     SpO2 06/22/21 1555 98 %     Weight 06/22/21 1559 234 lb (106.1 kg)     Height 06/22/21 1559  (1.727 m)     Head Circumference --      Peak Flow --      Pain Score 06/22/21 1557 0     Pain Loc --      Pain Edu? --      Excl. in GC? --   TMAX(24)@     _________________________________________ Significant initial  Findings: Abnormal Labs Reviewed  BASIC METABOLIC PANEL - Abnormal; Notable for the following components:      Result Value   Potassium 3.4 (*)    Glucose, Bld 109 (*)    All other components within normal limits  CBC WITH DIFFERENTIAL/PLATELET - Abnormal; Notable for the following components:   RBC 5.68 (*)    Hemoglobin 11.6 (*)    MCV 67.4 (*)     MCH 20.4 (*)    All other components within normal limits  D-DIMER, QUANTITATIVE - Abnormal; Notable for the following components:   D-Dimer, Quant 0.65 (*)    All other components within normal limits   ____________________________________________ Ordered  CXR - Stable chest allowing for slightly lower lung volumes. No active cardiopulmonary process.   VQ scan pending _________________________ Troponin 12 ECG: Ordered Personally reviewed by me showing: HR : 117 Rhythm:  Sinus tachycardia     no evidence of ischemic changes QTC 434 ________   The recent clinical data is shown below. Vitals:   06/22/21 1600 06/22/21 1630 06/22/21 1700 06/22/21 1730  BP: (!) 166/95 (!) 180/78 (!) 187/86 (!) 176/82  Pulse: (!) 118 (!) 124 (!) 120 (!) 120  Resp: (!) 25 (!) Temp:      TempSrc:      SpO2: 99% 99% 98% 96%  Weight:      Height:         WBC     Component Value Date/Time   WBC 8.0 06/22/2021 1603   LYMPHSABS 1.6 06/22/2021 1603   LYMPHSABS 1.4 07/06/2014 1504   LYMPHSABS 1.0 07/01/2009 0910   MONOABS 0.9 06/22/2021 1603   MONOABS 0.4 07/01/2009 0910   EOSABS 0.1 06/22/2021 1603   EOSABS 0.2 07/06/2014 1504   BASOSABS 0.0 06/22/2021 1603   BASOSABS 0.0 07/06/2014 1504   BASOSABS 0.0 07/01/2009 0910        UA   ordered   Urine analysis:    Component Value Date/Time   COLORURINE YELLOW 02/19/2018 1240   APPEARANCEUR CLEAR 02/19/2018 1240   LABSPEC 1.015 02/19/2018 1240   LABSPEC 1.020 06/05/2006 0845   PHURINE 6.0 02/19/2018 1240   GLUCOSEU NEGATIVE 02/19/2018 1240   HGBUR LARGE (A) 02/19/2018 1240   BILIRUBINUR NEGATIVE 02/19/2018 1240   BILIRUBINUR Negative 06/05/2006 0845   KETONESUR NEGATIVE 02/19/2018 1240   PROTEINUR NEGATIVE 02/19/2018 1240   UROBILINOGEN 0.2 09/30/2013 2344   NITRITE NEGATIVE 02/19/2018 1240   LEUKOCYTESUR TRACE (A) 02/19/2018 1240   LEUKOCYTESUR Negative 06/05/2006 0845    Results for orders placed or performed during  the hospital encounter of 06/22/21  Resp Panel by RT-PCR (Flu A&B, Covid) Nasopharyngeal Swab     Status: None   Collection Time:  06/22/21  4:04 PM   Specimen: Nasopharyngeal Swab; Nasopharyngeal(NP) swabs in vial transport medium  Result Value Ref Range Status   SARS Coronavirus 2 by RT PCR NEGATIVE NEGATIVE Final         Influenza A by PCR NEGATIVE NEGATIVE Final   Influenza B by PCR NEGATIVE NEGATIVE Final          ________________________________________ Hospitalist was called for admission for sinus tachycardia  The following Work up has been ordered so far:  Orders Placed This Encounter  Procedures   Resp Panel by RT-PCR (Flu A&B, Covid) Nasopharyngeal Swab   DG Chest Portable 1 View   NM PULMONARY VENT AND PERF (V/Q Scan)   Basic metabolic panel   CBC with Differential   D-dimer, quantitative   Brain natriuretic peptide   TSH   T4, free   Orthostatic vital signs   Consult to hospitalist   EKG 12-Lead   Insert peripheral IV   Place in observation (patient's expected length of stay will be less than 2 midnights)   VAS [place] UPPER EXTREMITY VENOUS DUPLEX     Following Medications were ordered in ER: Medications  sodium chloride 0.9 % bolus 1,000 mL (has no administration in time range)        Consult Orders  (From admission, onward)           Start     Ordered   06/22/21 1754  Consult to hospitalist  Once       Provider:  (Not yet assigned)  Question Answer Comment  Place call to: [facility]   Reason for Consult Admit      06/22/21 1753             OTHER Significant initial  Findings:  labs showing:    Recent Labs  Lab 06/22/21 1603  NA 140  K 3.4*  CO2 25  GLUCOSE 109*  BUN 21  CREATININE 0.96  CALCIUM 9.1    Cr    stable,    Lab Results  Component Value Date   CREATININE 0.96 06/22/2021   CREATININE 1.20 (H) 08/01/2018   CREATININE 1.26 (H) 04/25/2018    No results for input(s): AST, ALT, ALKPHOS, BILITOT, PROT,  ALBUMIN in the last 168 hours. Lab Results  Component Value Date   CALCIUM 9.1 06/22/2021   PHOS 5.0 (H) 02/19/2018       Plt: Lab Results  Component Value Date   PLT 226 06/22/2021     COVID-19 Labs  Recent Labs    06/22/21 1603  DDIMER 0.65*    Lab Results  Component Value Date   SARSCOV2NAA NEGATIVE 06/22/2021       Recent Labs  Lab 06/22/21 1603  WBC 8.0  NEUTROABS 5.5  HGB 11.6*  HCT 38.3  MCV 67.4*  PLT 226    HG/HCT  stable,       Component Value Date/Time   HGB 11.6 (L) 06/22/2021 1603   HGB 11.7 07/06/2014 1504   HGB 11.7 07/01/2009 0910   HCT 38.3 06/22/2021 1603   HCT 36.8 07/06/2014 1504   HCT 36.4 07/01/2009 0910   MCV 67.4 (L) 06/22/2021 1603   MCV 72 (L) 07/06/2014 1504   MCV 70.7 (L) 07/01/2009 0910     Cardiac Panel (last 3 results) No results for input(s): CKTOTAL, CKMB, TROPONINI, RELINDX in the last 72 hours.   BNP (last 3 results) Recent Labs    06/22/21 1603  BNP 9.4  Cultures:    Component Value Date/Time   SDES  01/30/2018 1530    BLOOD BLOOD RIGHT HAND Performed at [facility], [address] [place], [place] [address]    [facility]  01/30/2018 1530    IN PEDIATRIC BOTTLE Blood Culture adequate volume Performed at [facility], [name] [place], [place] [address]    CULT  01/30/2018 1530    NO GROWTH 5 DAYS Performed at [facility], [address] [place], [place] [address]    REPTSTATUS 02/04/2018 FINAL 01/30/2018 1530     Radiological Exams on Admission: DG Chest Portable 1 View  Result Date: 06/22/2021 CLINICAL DATA:  Shortness of breath, chest and left arm pain for 2 years. EXAM: PORTABLE CHEST 1 VIEW COMPARISON:  Radiographs 02/01/2018 and 10/27/2013.  CT 01/30/2018. FINDINGS: 1605 hours. The heart size and mediastinal contours are stable. There is mild aortic atherosclerosis. There are slightly lower lung volumes. However, the lungs appear clear, and there is  no pleural effusion or pneumothorax. Telemetry leads overlie the chest. The bones appear unremarkable. IMPRESSION: Stable chest allowing for slightly lower lung volumes. No active cardiopulmonary process. Electronically Signed   By: Carey Bullocks M.D.   On: 06/22/2021 16:42   VAS [place] UPPER EXTREMITY VENOUS DUPLEX  Result Date: 06/22/2021 UPPER VENOUS STUDY  Patient Name:  TANIJAH MORAIS  Date of Exam:   06/22/2021 Medical Rec #: [id number]           Accession #:    [id number] Date of Birth: Nov 17, 1961           Patient Gender: F Patient Age:   60Y Exam Location:  [facility] Procedure:      VAS [place] UPPER EXTREMITY VENOUS DUPLEX Referring Phys: [id number] MATTHEW J TRIFAN --------------------------------------------------------------------------------  Indications: SOB, and pain in left arm, history of DVT of LUE and thrombectomy Comparison Study: 01-30-2018 Left upper extremity venous study positive for                   occlusive DVT of brachial veins, non-occlusive DVT of                   subclavian and innominate veins.                    04-15-2018 Left upper extremity venous was negative for DVT. Performing Technologist: Jean Rosenthal RDMS,RVT  Examination Guidelines: A complete evaluation includes B-mode imaging, spectral Doppler, color Doppler, and power Doppler as needed of all accessible portions of each vessel. Bilateral testing is considered an integral part of a complete examination. Limited examinations for reoccurring indications may be performed as noted.  Right Findings: +----------+------------+---------+-----------+----------+-------+ RIGHT     CompressiblePhasicitySpontaneousPropertiesSummary +----------+------------+---------+-----------+----------+-------+ Subclavian               Yes       Yes                      +----------+------------+---------+-----------+----------+-------+  Left Findings: +----------+------------+---------+-----------+----------+-------+ LEFT       CompressiblePhasicitySpontaneousPropertiesSummary +----------+------------+---------+-----------+----------+-------+ IJV           Full       Yes       Yes                      +----------+------------+---------+-----------+----------+-------+ Subclavian    Full       Yes  Yes                      +----------+------------+---------+-----------+----------+-------+ Axillary      Full       Yes       Yes                      +----------+------------+---------+-----------+----------+-------+ Brachial      Full                                          +----------+------------+---------+-----------+----------+-------+ Radial        Full                                          +----------+------------+---------+-----------+----------+-------+ Ulnar         Full                                          +----------+------------+---------+-----------+----------+-------+ Cephalic      Full                                          +----------+------------+---------+-----------+----------+-------+ Basilic       Full                                          +----------+------------+---------+-----------+----------+-------+  Summary:  Right: No evidence of thrombosis in the subclavian.  Left: No evidence of deep vein thrombosis in the upper extremity. No evidence of superficial vein thrombosis in the upper extremity.  *See table(s) above for measurements and observations.  Diagnosing physician: Heath Larkhomas Hawken Electronically signed by Heath Larkhomas Hawken on 06/22/2021 at 7:23:03 PM.    Final    _______________________________________________________________________________________________________ Latest  Blood pressure (!) 176/82, pulse (!) 120, temperature 97.8 F (36.6 C), temperature source Oral, resp. rate 16, height 5\' 8"  (1.727 m), weight 106.1 kg, SpO2 96 %.   Review of Systems:    Pertinent positives include:  fatigue, hot flushes,  dyspnea on exertion,  excess mucus,coughing up of blood. Constitutional:  No weight loss, night sweats, Fevers, chills, weight loss  HEENT:  No headaches, Difficulty swallowing,Tooth/dental problems,Sore throat,  No sneezing, itching, ear ache, nasal congestion, post nasal drip,  Cardio-vascular:  No chest pain, Orthopnea, PND, anasarca, dizziness, palpitations.no Bilateral lower extremity swelling  GI:  No heartburn, indigestion, abdominal pain, nausea, vomiting, diarrhea, change in bowel habits, loss of appetite, melena, blood in stool, hematemesis Resp:  no shortness of breath at rest. No  no productive cough, No non-productive cough, No No change in color of mucus.No wheezing. Skin:  no rash or lesions. No jaundice GU:  no dysuria, change in color of urine, no urgency or frequency. No straining to urinate.  No flank pain.  Musculoskeletal:  No joint pain or no joint swelling. No decreased range of motion. No back pain.  Psych:  No change in mood or affect. No depression or anxiety. No memory loss.  Neuro: no localizing neurological complaints, no  tingling, no weakness, no double vision, no gait abnormality, no slurred speech, no confusion  All systems reviewed and apart from HOPI all are negative _______________________________________________________________________________________________ Past Medical History:   Past Medical History:  Diagnosis Date   Adjustment disorder with mixed anxiety and depressed mood 09/28/2009   Qualifier: Diagnosis of  By: Nena Jordan    Allergic state 04/09/2013   Anemia    thalassemia   Anxiety    takes Ativan daily prn   Arthritis    sees Dr.Beekman   Bursitis of right shoulder    Cholecystitis, acute 08/30/2013   Chronic insomnia    Complication of anesthesia    hard to sedate   Constipation    takes Linzess daily prn   Depression    takes Effexor daily   Difficulty swallowing solids    DVT (deep venous thrombosis) (HCC) 01/31/2018   LUE    Fibromyalgia    GERD (gastroesophageal reflux disease) 06/17/2013   takes Dexilant daily prn   H. pylori infection 07/19/2013   H/O hiatal hernia    Headache(784.0) 05/22/2013   Hereditary and idiopathic peripheral neuropathy 03/14/2015   History of bronchitis    last time many yrs ago   History of colonoscopy 2000   Hyperlipidemia    not on any meds    Hypoglycemia    Hypothyroidism    Lichen    Dr[facility]Fleisher at [facility]   Migraines    takes Topamax daily-last migraine today   Muscle spasms of head and/or neck    takes Norflex daily prn   Nausea    takes Phenergan prn   Obesity    Peripheral edema    takes Lasix prn   PONV (postoperative nausea and vomiting)    with tonsil removal surgery   PUD (peptic ulcer disease)    Dr Loreta [facility] ( 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 secondary to ativan withdrawal -Dr Sharene Skeans   Seizures [facility])    X 2   Shortness of breath    pt states when Fibromyalgia flares up   Sinusitis, acute 04/09/2013   takes Zyrtec daily    Past Surgical History:  Procedure Laterality Date   ABDOMINAL HYSTERECTOMY  2000   partial   CHOLECYSTECTOMY N/A 09/16/2013   Procedure: LAPAROSCOPIC CHOLECYSTECTOMY;  Surgeon: Emelia Loron, MD;  Location: [facility] OR;  Service: General;  Laterality: N/A;   COLONOSCOPY     IR FLUORO GUIDE CV LINE RIGHT  02/06/2018   IR US GUIDE VASC ACCESS RIGHT  02/06/2018   PERIPHERAL VASCULAR THROMBECTOMY Left 01/31/2018   Procedure: PERIPHERAL VASCULAR THROMBECTOMY;  Surgeon: Fransisco Hertz, MD;  Location: [facility] INVASIVE CV LAB;  Service: Cardiovascular;  Laterality: Left;   REMOVAL OF CERVICAL EXOSTOSIS N/A 06/08/2016   Procedure: REMOVAL OF  EXOSTOSIS ANTERIOR CERVICAL SPINE;  Surgeon: Venita Lick, MD;  Location: [facility] OR;  Service: Orthopedics;  Laterality: N/A;   TONSILLECTOMY  1972    Social History:  Ambulatory  independently      reports that she has never smoked. She has never used smokeless tobacco. She reports current alcohol  use. She reports that she does not use drugs.    Family History:   Family History  Problem Relation Age of Onset   Diabetes Mother        typer 2   Heart disease Mother        PVD, PAD   Cancer Father  prostate, lung, brain mets, smoker   COPD Father    Hyperlipidemia Father    Hypertension Father    Thyroid disease Father    Hypertension Sister    Thyroid disease Sister    Arthritis Sister    Diabetes Son 45       type 1   Diabetes Maternal Grandmother    Stroke Maternal Grandfather    Cancer Paternal Grandmother 90       breast   Alcohol abuse Paternal Grandfather    Hypertension Sister    Obesity Sister    Psoriasis Son    Cancer Other        Breast < 48 yo, ,Prostate <50 yo   Coronary artery disease Other        <60 yo   Diabetes Other    Breast cancer Neg Hx    ______________________________________________________________________________________________ Allergies: Allergies  Allergen Reactions   Aspirin Other (See Comments)    Per Dr   Augmentin [Amoxicillin-Pot Clavulanate] Other (See Comments)    Blisters in the back of throat   Dexilant [Dexlansoprazole] Other (See Comments)    Bowel urgency and liquid stools   Hydrocodone-Acetaminophen Hives    REACTION: Rash   Influenza Vaccines Swelling    Arm swelled to the size of a grapefruit   Levsin [Hyoscyamine Sulfate] Other (See Comments)    Projectile vomitting   Linzess [Linaclotide] Other (See Comments)    Bowel urgency and liquid stools   Promethazine Nausea And Vomiting   Propoxyphene N-Acetaminophen Itching    REACTION: rash   Statins Other (See Comments)    myalgia   Acetaminophen Itching and Rash   Latex Itching and Rash    Rash-looks like she has the measles per pt   Oxycodone-Acetaminophen Itching and Rash    REACTION: Rash     Prior to Admission medications   Medication Sig Start Date End Date Taking? Authorizing Provider  Bisacodyl (DULCOLAX PO) Take 10 mg by mouth as needed  (constipation).    [provider]  gabapentin (NEURONTIN) 100 MG capsule TAKE 3 CAPSULES BY MOUTH EVERY NIGHT AT BEDTIME 01/11/21 01/11/22  McPeeks, Marcelo Baldy, PA-C  gabapentin (NEURONTIN) 100 MG capsule TAKE 1 CAPSULE (100 MG TOTAL) BY MOUTH AT BEDTIME. 11/22/20 11/22/21  McPeeks, Marcelo Baldy, PA-C  gabapentin (NEURONTIN) 300 MG capsule Take 1 capsule (300 mg total) by mouth at bedtime. 02/19/18   Calvert Cantor, MD  hydrALAZINE (APRESOLINE) 50 MG tablet TAKE 1 TABLET BY MOUTH TWICE DAILY 12/14/20 12/14/21  Annie Sable, MD  hydrALAZINE (APRESOLINE) 50 MG tablet TAKE 1 TABLET BY MOUTH TWICE DAILY 06/03/20 06/03/21  Annie Sable, MD  hydrALAZINE (APRESOLINE) 50 MG tablet Take 1 tablet by mouth twice a day 06/21/21     HYDROmorphone (DILAUDID) 4 MG tablet Take 1 tablet by mouth 2 times daily 06/07/21     hydrOXYzine (ATARAX/VISTARIL) 50 MG tablet Take 1 tablet (50 mg total) by mouth 3 (three) times daily as needed for anxiety. 01/24/18   Money, Gerlene Burdock, FNP  hydrOXYzine (VISTARIL) 25 MG capsule TAKE 1 CAPSULE BY MOUTH THREE TIMES DAILY AS NEEDED FOR ITCHING 01/11/21 01/11/22  McPeeks, Marcelo Baldy, PA-C  hydrOXYzine (VISTARIL) 25 MG capsule TAKE 1 CAPSULE BY MOUTH THREE TIMES DAILY 09/21/20 09/21/21  Karle Plumber, MD  hydrOXYzine (VISTARIL) 25 MG capsule Take 1 capsule by mouth 3 times daily 06/07/21     levothyroxine (SYNTHROID, LEVOTHROID) 25 MCG tablet TAKE 1 TABLET BY MOUTH DAILY BEFORE BREAKFAST 03/30/16  Bradd Canary, MD  LORazepam (ATIVAN) 1 MG tablet TAKE 1 TABLET BY MOUTH TWO TIMES DAILY AS NEEDED 03/01/21 08/28/21    LORazepam (ATIVAN) 1 MG tablet TAKE 1 TABLET BY MOUTH TWO TIMES DAILY, AS NEEDED 01/04/21 07/03/21  Ameh, Nyra Market, FNP  LORazepam (ATIVAN) 1 MG tablet Take 1 Tablet by mouth two times daily, as needed 05/10/21     LORazepam (ATIVAN) 1 MG tablet Take 1 Tablet by mouth two times daily, as needed 05/21/21     LORazepam (ATIVAN) 1 MG tablet Take 1  tablet by mouth two times daily, as needed 05/20/21     LORazepam (ATIVAN) 1 MG tablet Take 1 tablet by mouth two times daily, as needed 05/25/21     methimazole (TAPAZOLE) 10 MG tablet TAKE 1 (ONE) TABLET BY MOUTH DAILY 08/03/20 08/03/21  Karle Plumber, MD  morphine (MS CONTIN) 15 MG 12 hr tablet TAKE 1 TABLET BY MOUTH TWICE DAILY 03/04/21 08/31/21  Karle Plumber, MD  morphine (MS CONTIN) 15 MG 12 hr tablet TAKE ONE TABLET BY MOUTH TWICE DAILY 02/02/21 08/01/21  Karle Plumber, MD  morphine (MS CONTIN) 15 MG 12 hr tablet TAKE 1 TABLET BY MOUTH 2 TIMES DAILY 01/03/21 07/02/21  Karle Plumber, MD  omeprazole (PRILOSEC) 40 MG capsule TAKE 1 CAPSULE BY MOUTH ONCE DAILY 01/28/21 01/28/22    permethrin (ELIMITE) 5 % cream APPLY ONE APPLICATION ONE TIME DAILY 10/05/20 10/05/21  Karle Plumber, MD  permethrin (ELIMITE) 5 % cream APPLY ONE APPLICATION ONE TIME, REPEAT APPLICATION AFTER 1 WEEK 09/21/20 09/21/21  Karle Plumber, MD  propranolol (INDERAL) 20 MG tablet TAKE 1 TABLET BY MOUTH TWICE DAILY 03/01/21 03/01/22  Kevin Fenton, FNP  propranolol (INDERAL) 20 MG tablet TAKE 1 TABLET BY MOUTH TWICE DAILY 11/09/20 11/09/21  Kevin Fenton, FNP  propranolol (INDERAL) 20 MG tablet TAKE 1 TABLET BY MOUTH TWICE DAILY 09/14/20 09/14/21  Ameh, Nyra Market, FNP  propranolol (INDERAL) 20 MG tablet Take 1 (one) tablet by mouth two times daily 05/25/21     propranolol (INDERAL) 40 MG tablet Take 1 tablet by mouth 2 times daily 06/07/21     QUEtiapine (SEROQUEL) 100 MG tablet TAKE 1 TO 2 TABLETS BY MOUTH AT BEDTIME 03/01/21 03/01/22  Kevin Fenton, FNP  QUEtiapine (SEROQUEL) 100 MG tablet TAKE 1-2 TABLETS BY MOUTH AT BEDTIME 01/04/21 01/04/22  Kevin Fenton, FNP  QUEtiapine (SEROQUEL) 100 MG tablet TAKE 1-2 TABLETS BY MOUTH AT BEDTIME 11/09/20 11/09/21  Kevin Fenton, FNP  QUEtiapine (SEROQUEL) 100 MG tablet TAKE 1 TO 2 TABLETS BY MOUTH AT BEDTIME 09/21/20 09/21/21  Karle Plumber, MD  QUEtiapine  (SEROQUEL) 100 MG tablet Take 1-2 tablets by mouth at bedtime 05/25/21     QUEtiapine (SEROQUEL) 50 MG tablet TAKE 2 TABLETS BY MOUTH AT BEDTIME 09/14/20 09/14/21  Ameh, Nyra Market, FNP  rivaroxaban (XARELTO) 20 MG TABS tablet Take 1 tablet (20 mg total) by mouth daily with supper. Stop taking Eliquis and start taking Xarelto as prescribed. Future refills with PCP. 08/05/18   Johney Maine, MD  tacrolimus (PROTOPIC) 0.1 % ointment APPLY TO AFFECTED SKIN OF FACE/NECK TWICE DAILY AS NEEDED FOR ITCHING/RASH. 03/11/21 03/11/22  McPeeks, Marcelo Baldy, PA-C  tacrolimus (PROTOPIC) 0.1 % ointment APPLY TO THE AFFECTED AREA OF THE FACE/NECK 2 TIMES DAILY AS NEEDED FOR ITCHING/RASH 01/11/21 01/11/22  McPeeks, Marcelo Baldy, PA-C  tiZANidine (ZANAFLEX) 4 MG tablet TAKE 1 TABLET BY MOUTH EVERY NIGHT  AT BEDTIME 08/03/20 08/03/21  Karle Plumber, MD  topiramate (TOPAMAX) 100 MG tablet Take 1 tablet (100 mg total) by mouth 2 (two) times daily. 01/24/18   Money, Gerlene Burdock, FNP  triamcinolone ointment (KENALOG) 0.1 % APPLY TO AFFECTED AREA SKIN 2 TIMES DAILY AS NEEDED FOR RASH/ITCHING **NEVER APPLY TO FACE/NECK/GROIN** 01/11/21 01/11/22  McPeeks, Marcelo Baldy, PA-C  venlafaxine (EFFEXOR) 25 MG tablet TAKE 1 TABLET BY MOUTH DAILY, TAKE WITH 150 MG DOSE TO EQUAL 175 MG DAILY 03/01/21 03/01/22  Kevin Fenton, FNP  venlafaxine (EFFEXOR) 25 MG tablet TAKE 1 TABLET BY MOUTH DAILY, TAKE WITH 150 MG DOSE TO EQUAL 175 MG DAILY 01/04/21 01/04/22  Kevin Fenton, FNP  venlafaxine (EFFEXOR) 25 MG tablet TAKE 1 TABLET BY MOUTH DAILY, TAKE WITH 150 MG DOSE TO EQUAL 175 MG DAILY 11/09/20 11/09/21  Kevin Fenton, FNP  venlafaxine (EFFEXOR) 25 MG tablet TAKE 1 TABLET BY MOUTH DAILY, TAKE WITH 150 MG DOSE TO EQUAL 175 MG DAILY 09/14/20 09/14/21  Kevin Fenton, FNP  venlafaxine (EFFEXOR) 25 MG tablet Take 1 tablet by mouth daily, take with 150 mg dose to equal 175 mg daily 05/25/21     venlafaxine XR (EFFEXOR-XR) 150 MG 24 hr  capsule TAKE 1 CAPSULE BY MOUTH ONCE DAILY 03/01/21 03/01/22  Kevin Fenton, FNP  venlafaxine XR (EFFEXOR-XR) 150 MG 24 hr capsule TAKE 1 CAPSULE BY MOUTH ONCE DAILY 01/04/21 01/04/22  Kevin Fenton, FNP  venlafaxine XR (EFFEXOR-XR) 150 MG 24 hr capsule TAKE 1 (ONE) CAPSULE BY MOUTH DAILY 11/09/20 11/09/21  Kevin Fenton, FNP  venlafaxine XR (EFFEXOR-XR) 150 MG 24 hr capsule TAKE 1 CAPSULE BY MOUTH ONCE A DAY 09/14/20 09/14/21  Kevin Fenton, FNP  venlafaxine XR (EFFEXOR-XR) 150 MG 24 hr capsule Take 1 (one) Capsule by mouth daily 05/25/21     venlafaxine XR (EFFEXOR-XR) 75 MG 24 hr capsule Take 3 capsules (225 mg total) by mouth daily with breakfast. 01/25/18   Money, Gerlene Burdock, FNP  Vitamin D, Ergocalciferol, (DRISDOL) 1.25 MG (50000 UNIT) CAPS capsule TAKE 1 CAPSULE BY MOUTH ONCE A WEEK 01/17/21 01/17/22  Arvind, [name], MD  Vitamin D, Ergocalciferol, (DRISDOL) 1.25 MG (50000 UNIT) CAPS capsule TAKE 1 (ONE) CAPSULE BY MOUTH ONCE A WEEK 09/14/20 09/14/21  Arvind, [name], MD  ZETIA 10 MG tablet Take 10 mg by mouth daily. 10/18/17   [provider]    ___________________________________________________________________________________________________ Physical Exam: Vitals with BMI 06/22/2021 06/22/2021 06/22/2021  Height - - -  Weight - - -  BMI - - -  Systolic 176 187 629  Diastolic 82 86 78  Pulse 120 120 124     1. General:  in No  Acute distress    Chronically ill  -appearing 2. Psychological: Alert and  Oriented 3. Head/ENT:    Dry Mucous Membranes                          Head Non traumatic, neck supple                           Poor Dentition 4. SKIN: normal  Skin turgor,  Skin clean Dry and intact no rash 5. Heart: Regular rate and rhythm no Murmur, no Rub or gallop 6. Lungs:  no wheezes or crackles   7. Abdomen: Soft,  non-tender, Non distended   8. Lower extremities: no clubbing, cyanosis, no  edema 9. Neurologically Grossly intact, moving all 4 extremities  equally   10. MSK: Normal range of motion    Chart has been reviewed  ______________________________________________________________________________________________  Assessment/Plan 60 y.o. female with medical history significant of DVT of left arm 01/31/18, fibromyalgia 2014, GERD 2014, hereditary and idiopathic peripheral neuropathy 2016, hyperlipidemia, hypoglycemia, and two seizures.neurodermatitis.  Admitted for tachycardia  Present on Admission:  Tachycardia - hx of the same will restart propranolol, and ativan check TSH Obtain echo Pt reportedly was supposed to have stress test done Trop neg denies any chest pain  VQ scan ordered was negative TSH low T4 elevated Pt with prior history of    Obesity - chronic stable, follow up with nutrition   Hypothyroidism/hyperthyroidism - no longer takes synthroid Will check TSH and now off  Evidence of hyperthyroidism now will restart Tapazole and make sure on propranolol Will need to follow up with endocrinology Order Thyroid antibodies work up Thyroid US HR now down to mid 90's after restarting propranolol and iv fluids   Hypokalemia - - will replace and repeat in AM,  check magnesium level and replace as needed, replace mag   Hyperlipidemia, mixed -does not tolerate statin   GERD (gastroesophageal reflux disease) - continue PPI   Hemoptysis - mild chronic CXR unremarkable no infiltrates, pt have seen pulmonology in the past benefit from follow up    Other plan as per orders.  DVT prophylaxis:  SCD      Code Status:    Code Status: Prior FULL CODE   care as per patient  I had personally discussed CODE STATUS with patient     Family Communication:   Family not at  Bedside    Disposition Plan:     To home once workup is complete and patient is stable   Following barriers for discharge:                            Electrolytes corrected                               Anemia stable                     Consults called:  none  Admission status:  ED Disposition     ED Disposition  Admit   Condition  --   Comment  Hospital Area: [facility] [[id number]]  Level of Care: Progressive [102]  Admit to Progressive based on following criteria: CARDIOVASCULAR & THORACIC of moderate stability with acute coronary syndrome symptoms/low risk myocardial infarction/hypertensive urgency/arrhythmias/heart failure potentially compromising stability and stable post cardiovascular intervention patients.  May place patient in observation at [facility] or [facility] if equivalent level of care is available:: No  Covid Evaluation: Confirmed COVID Negative  Diagnosis: Tachycardia [[id number]]  Admitting Physician: Therisa Doyne [[id number]]  Attending Physician: Therisa Doyne [[id number]]           Obs     Level of care  progressive  tele indefinitely please discontinue once patient no longer qualifies COVID-19 Labs    Lab Results  Component Value Date   SARSCOV2NAA NEGATIVE 06/22/2021     Precautions: admitted as  Covid Negative      PPE: Used by the provider:   N95  eye Goggles,  Gloves     Jaanai Salemi 06/22/2021, 8:26 PM    [facility]  after 2 AM please page floor coverage PA If 7AM-7PM, please contact the day team taking care of the patient using [email or web address]   Patient was evaluated in the context of the global COVID-19 pandemic, which necessitated consideration that the patient might be at risk for infection with the SARS-CoV-2 virus that causes COVID-19. Institutional protocols and algorithms that pertain to the evaluation of patients at risk for COVID-19 are in a state of rapid change based on information released by regulatory bodies including the CDC and federal and state organizations. These policies and algorithms were followed during the patient's care.

## 2021-06-22 NOTE — Progress Notes (Signed)
Upper extremity venous LT study completed.  Preliminary results relayed to Renaye Rakers, MD.  See CV Proc for preliminary results report.   Jean Rosenthal, RDMS, RVT

## 2021-06-22 NOTE — ED Provider Notes (Signed)
[facility]-EMERGENCY DEPT Provider Note   CSN: [id number] Arrival date & time: 06/22/21  1536     History CC:  Arm pain   Heather Hanson is a 60 y.o. female with a history of left upper extremity DVT in 2019, temporarily on Eliquis but no longer, chronic kidney disease, obesity, presenting from the GI doctors office with concern for tachycardia, fatigue, and EKG changes.  The patient reports that she has noticed that she feels rundown for the past few days.  She has been having aching pain in her left upper arm for "a few weeks".  She was at the gastroenterologist office today they were concerned that her EKG "had some changes" and told her to come to the ED. she denies chest pain.  She reports shortness of breath which she states is largely chronic, but worsening.  [place] from Feb 2019 with DVT in brachial veins in subclavian vein.  Medical record review sent discharge from 2019 showed that the patient underwent angiojet thrombectomy in Feb 2021 with Dr Myra Gianotti but continued to have clot burden afterward.  Patient also experienced contrast-induced nephropathy at that time and required dialysis x 2 while in the hospital, but she reports she is no longer on dialysis and urinating normally.  HPI     Past Medical History:  Diagnosis Date   Adjustment disorder with mixed anxiety and depressed mood 09/28/2009   Qualifier: Diagnosis of  By: Nena Jordan    Allergic state 04/09/2013   Anemia    thalassemia   Anxiety    takes Ativan daily prn   Arthritis    sees Dr.Beekman   Bursitis of right shoulder    Cholecystitis, acute 08/30/2013   Chronic insomnia    Complication of anesthesia    hard to sedate   Constipation    takes Linzess daily prn   Depression    takes Effexor daily   Difficulty swallowing solids    DVT (deep venous thrombosis) (HCC) 01/31/2018   LUE   Fibromyalgia    GERD (gastroesophageal reflux disease) 06/17/2013   takes Dexilant daily prn    H. pylori infection 07/19/2013   H/O hiatal hernia    Headache(784.0) 05/22/2013   Hereditary and idiopathic peripheral neuropathy 03/14/2015   History of bronchitis    last time many yrs ago   History of colonoscopy 2000   Hyperlipidemia    not on any meds    Hypoglycemia    Hypothyroidism    Lichen    Dr[facility]Fleisher at [facility]   Migraines    takes Topamax daily-last migraine today   Muscle spasms of head and/or neck    takes Norflex daily prn   Nausea    takes Phenergan prn   Obesity    Peripheral edema    takes Lasix prn   PONV (postoperative nausea and vomiting)    with tonsil removal surgery   PUD (peptic ulcer disease)    Dr Loreta Ave ( 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 pylori infection 07/19/2013   GERD (gastroesophageal reflux disease) 06/17/2013   Headache(784.0) 05/22/2013   Sinusitis, acute 04/09/2013   Allergic state 04/09/2013   Back pain 01/27/2012   Annual physical exam 09/30/2011   Microcytosis 09/30/2011   Lichen planus 09/30/2011   Polyarthralgia 09/30/2011   DERMATITIS 12/15/2010   Constipation 11/23/2009   FIBROMYALGIA 11/09/2009   NAUSEA 11/09/2009   Hyperlipidemia, mixed 09/28/2009   Obesity 09/28/2009   Anemia 09/28/2009   Depression with anxiety 09/28/2009    Seizure (HCC) 09/28/2009    Past Surgical History:  Procedure Laterality Date   ABDOMINAL HYSTERECTOMY  2000   partial   CHOLECYSTECTOMY N/A 09/16/2013   Procedure: LAPAROSCOPIC CHOLECYSTECTOMY;  Surgeon: Emelia LoronMatthew Wakefield, MD;  [facility];  Service: General;  Laterality: N/A;   COLONOSCOPY     IR FLUORO GUIDE CV LINE RIGHT  02/06/2018   IR US GUIDE VASC ACCESS RIGHT  02/06/2018   PERIPHERAL VASCULAR THROMBECTOMY Left 01/31/2018   Procedure: PERIPHERAL VASCULAR THROMBECTOMY;  Surgeon: Fransisco Hertzhen, Brian L, MD;  [facility];  Service: Cardiovascular;  Laterality: Left;   REMOVAL OF CERVICAL EXOSTOSIS N/A 06/08/2016   Procedure: REMOVAL OF  EXOSTOSIS ANTERIOR CERVICAL SPINE;  Surgeon: Venita Lickahari Brooks, MD;  Location: [facility] OR;  Service: Orthopedics;  Laterality: N/A;   TONSILLECTOMY  1972     OB History   No obstetric history on file.     Family History  Problem Relation Age of Onset   Diabetes Mother        typer 2   Heart disease Mother        PVD, PAD   Cancer Father        prostate, lung, brain mets, smoker   COPD Father    Hyperlipidemia Father    Hypertension Father    Thyroid disease Father    Hypertension Sister    Thyroid disease Sister    Arthritis Sister    Diabetes Son 1316       type 1   Diabetes Maternal Grandmother    Stroke Maternal Grandfather    Cancer Paternal Grandmother [id number]       breast   Alcohol abuse Paternal Grandfather    Hypertension Sister    Obesity Sister    Psoriasis Son    Cancer Other        Breast < 62 yo, ,Prostate <50 yo   Coronary artery disease Other        <60 yo   Diabetes Other    Breast cancer Neg Hx     Social History   Tobacco Use   Smoking status: Never   Smokeless tobacco: Never  Vaping Use   Vaping Use: Never used  Substance Use Topics   Alcohol use: Yes    Comment: rare use   Drug use: No    Comment: Rare    Home Medications Prior to Admission medications   Medication Sig Start Date End Date Taking?  Authorizing Provider  Bisacodyl (DULCOLAX PO) Take 10 mg by mouth as needed (constipation).    [provider]  gabapentin (NEURONTIN) 100 MG capsule TAKE 3 CAPSULES BY MOUTH EVERY NIGHT AT BEDTIME 01/11/21 01/11/22  McPeeks, Marcelo BaldyKimberly Kincaid, PA-C  gabapentin (NEURONTIN) 100 MG  capsule TAKE 1 CAPSULE (100 MG TOTAL) BY MOUTH AT BEDTIME. 11/22/20 11/22/21  McPeeks, Marcelo Baldy, PA-C  gabapentin (NEURONTIN) 300 MG capsule Take 1 capsule (300 mg total) by mouth at bedtime. 02/19/18   Calvert Cantor, MD  hydrALAZINE (APRESOLINE) 50 MG tablet TAKE 1 TABLET BY MOUTH TWICE DAILY 12/14/20 12/14/21  Annie Sable, MD  hydrALAZINE (APRESOLINE) 50 MG tablet TAKE 1 TABLET BY MOUTH TWICE DAILY 06/03/20 06/03/21  Annie Sable, MD  hydrALAZINE (APRESOLINE) 50 MG tablet Take 1 tablet by mouth twice a day 06/21/21     HYDROmorphone (DILAUDID) 4 MG tablet Take 1 tablet by mouth 2 times daily 06/07/21     hydrOXYzine (ATARAX/VISTARIL) 50 MG tablet Take 1 tablet (50 mg total) by mouth 3 (three) times daily as needed for anxiety. 01/24/18   Money, Gerlene Burdock, FNP  hydrOXYzine (VISTARIL) 25 MG capsule TAKE 1 CAPSULE BY MOUTH THREE TIMES DAILY AS NEEDED FOR ITCHING 01/11/21 01/11/22  McPeeks, Marcelo Baldy, PA-C  hydrOXYzine (VISTARIL) 25 MG capsule TAKE 1 CAPSULE BY MOUTH THREE TIMES DAILY 09/21/20 09/21/21  Karle Plumber, MD  hydrOXYzine (VISTARIL) 25 MG capsule Take 1 capsule by mouth 3 times daily 06/07/21     levothyroxine (SYNTHROID, LEVOTHROID) 25 MCG tablet TAKE 1 TABLET BY MOUTH DAILY BEFORE BREAKFAST 03/30/16   Bradd Canary, MD  LORazepam (ATIVAN) 1 MG tablet TAKE 1 TABLET BY MOUTH TWO TIMES DAILY AS NEEDED 03/01/21 08/28/21    LORazepam (ATIVAN) 1 MG tablet TAKE 1 TABLET BY MOUTH TWO TIMES DAILY, AS NEEDED 01/04/21 07/03/21  Ameh, Nyra Market, FNP  LORazepam (ATIVAN) 1 MG tablet Take 1 Tablet by mouth two times daily, as needed 05/10/21     LORazepam (ATIVAN) 1 MG tablet Take 1 Tablet by mouth two  times daily, as needed 05/21/21     LORazepam (ATIVAN) 1 MG tablet Take 1 tablet by mouth two times daily, as needed 05/20/21     LORazepam (ATIVAN) 1 MG tablet Take 1 tablet by mouth two times daily, as needed 05/25/21     methimazole (TAPAZOLE) 10 MG tablet TAKE 1 (ONE) TABLET BY MOUTH DAILY 08/03/20 08/03/21  Karle Plumber, MD  morphine (MS CONTIN) 15 MG 12 hr tablet TAKE 1 TABLET BY MOUTH TWICE DAILY 03/04/21 08/31/21  Karle Plumber, MD  morphine (MS CONTIN) 15 MG 12 hr tablet TAKE ONE TABLET BY MOUTH TWICE DAILY 02/02/21 08/01/21  Karle Plumber, MD  morphine (MS CONTIN) 15 MG 12 hr tablet TAKE 1 TABLET BY MOUTH 2 TIMES DAILY 01/03/21 07/02/21  Karle Plumber, MD  omeprazole (PRILOSEC) 40 MG capsule TAKE 1 CAPSULE BY MOUTH ONCE DAILY 01/28/21 01/28/22    permethrin (ELIMITE) 5 % cream APPLY ONE APPLICATION ONE TIME DAILY 10/05/20 10/05/21  Karle Plumber, MD  permethrin (ELIMITE) 5 % cream APPLY ONE APPLICATION ONE TIME, REPEAT APPLICATION AFTER 1 WEEK 09/21/20 09/21/21  Karle Plumber, MD  propranolol (INDERAL) 20 MG tablet TAKE 1 TABLET BY MOUTH TWICE DAILY 03/01/21 03/01/22  Kevin Fenton, FNP  propranolol (INDERAL) 20 MG tablet TAKE 1 TABLET BY MOUTH TWICE DAILY 11/09/20 11/09/21  Kevin Fenton, FNP  propranolol (INDERAL) 20 MG tablet TAKE 1 TABLET BY MOUTH TWICE DAILY 09/14/20 09/14/21  Ameh, Nyra Market, FNP  propranolol (INDERAL) 20 MG tablet Take 1 (one) tablet by mouth two times daily 05/25/21     propranolol (INDERAL) 40 MG tablet Take 1 tablet by mouth 2 times daily 06/07/21  QUEtiapine (SEROQUEL) 100 MG tablet TAKE 1 TO 2 TABLETS BY MOUTH AT BEDTIME 03/01/21 03/01/22  Kevin Fenton, FNP  QUEtiapine (SEROQUEL) 100 MG tablet TAKE 1-2 TABLETS BY MOUTH AT BEDTIME 01/04/21 01/04/22  Kevin Fenton, FNP  QUEtiapine (SEROQUEL) 100 MG tablet TAKE 1-2 TABLETS BY MOUTH AT BEDTIME 11/09/20 11/09/21  Kevin Fenton, FNP  QUEtiapine (SEROQUEL) 100 MG tablet TAKE 1 TO 2 TABLETS  BY MOUTH AT BEDTIME 09/21/20 09/21/21  Karle Plumber, MD  QUEtiapine (SEROQUEL) 100 MG tablet Take 1-2 tablets by mouth at bedtime 05/25/21     QUEtiapine (SEROQUEL) 50 MG tablet TAKE 2 TABLETS BY MOUTH AT BEDTIME 09/14/20 09/14/21  Ameh, Nyra Market, FNP  rivaroxaban (XARELTO) 20 MG TABS tablet Take 1 tablet (20 mg total) by mouth daily with supper. Stop taking Eliquis and start taking Xarelto as prescribed. Future refills with PCP. 08/05/18   Johney Maine, MD  tacrolimus (PROTOPIC) 0.1 % ointment APPLY TO AFFECTED SKIN OF FACE/NECK TWICE DAILY AS NEEDED FOR ITCHING/RASH. 03/11/21 03/11/22  McPeeks, Marcelo Baldy, PA-C  tacrolimus (PROTOPIC) 0.1 % ointment APPLY TO THE AFFECTED AREA OF THE FACE/NECK 2 TIMES DAILY AS NEEDED FOR ITCHING/RASH 01/11/21 01/11/22  McPeeks, Marcelo Baldy, PA-C  tiZANidine (ZANAFLEX) 4 MG tablet TAKE 1 TABLET BY MOUTH EVERY NIGHT AT BEDTIME 08/03/20 08/03/21  Karle Plumber, MD  topiramate (TOPAMAX) 100 MG tablet Take 1 tablet (100 mg total) by mouth 2 (two) times daily. 01/24/18   Money, Gerlene Burdock, FNP  triamcinolone ointment (KENALOG) 0.1 % APPLY TO AFFECTED AREA SKIN 2 TIMES DAILY AS NEEDED FOR RASH/ITCHING **NEVER APPLY TO FACE/NECK/GROIN** 01/11/21 01/11/22  McPeeks, Marcelo Baldy, PA-C  venlafaxine (EFFEXOR) 25 MG tablet TAKE 1 TABLET BY MOUTH DAILY, TAKE WITH 150 MG DOSE TO EQUAL 175 MG DAILY 03/01/21 03/01/22  Kevin Fenton, FNP  venlafaxine (EFFEXOR) 25 MG tablet TAKE 1 TABLET BY MOUTH DAILY, TAKE WITH 150 MG DOSE TO EQUAL 175 MG DAILY 01/04/21 01/04/22  Kevin Fenton, FNP  venlafaxine (EFFEXOR) 25 MG tablet TAKE 1 TABLET BY MOUTH DAILY, TAKE WITH 150 MG DOSE TO EQUAL 175 MG DAILY 11/09/20 11/09/21  Kevin Fenton, FNP  venlafaxine (EFFEXOR) 25 MG tablet TAKE 1 TABLET BY MOUTH DAILY, TAKE WITH 150 MG DOSE TO EQUAL 175 MG DAILY 09/14/20 09/14/21  Kevin Fenton, FNP  venlafaxine (EFFEXOR) 25 MG tablet Take 1 tablet by mouth daily, take with 150 mg dose to  equal 175 mg daily 05/25/21     venlafaxine XR (EFFEXOR-XR) 150 MG 24 hr capsule TAKE 1 CAPSULE BY MOUTH ONCE DAILY 03/01/21 03/01/22  Kevin Fenton, FNP  venlafaxine XR (EFFEXOR-XR) 150 MG 24 hr capsule TAKE 1 CAPSULE BY MOUTH ONCE DAILY 01/04/21 01/04/22  Kevin Fenton, FNP  venlafaxine XR (EFFEXOR-XR) 150 MG 24 hr capsule TAKE 1 (ONE) CAPSULE BY MOUTH DAILY 11/09/20 11/09/21  Kevin Fenton, FNP  venlafaxine XR (EFFEXOR-XR) 150 MG 24 hr capsule TAKE 1 CAPSULE BY MOUTH ONCE A DAY 09/14/20 09/14/21  Kevin Fenton, FNP  venlafaxine XR (EFFEXOR-XR) 150 MG 24 hr capsule Take 1 (one) Capsule by mouth daily 05/25/21     venlafaxine XR (EFFEXOR-XR) 75 MG 24 hr capsule Take 3 capsules (225 mg total) by mouth daily with breakfast. 01/25/18   Money, Gerlene Burdock, FNP  Vitamin D, Ergocalciferol, (DRISDOL) 1.25 MG (50000 UNIT) CAPS capsule TAKE 1 CAPSULE BY MOUTH ONCE A WEEK 01/17/21 01/17/22  Karle Plumber, MD  Vitamin D,  Ergocalciferol, (DRISDOL) 1.25 MG (50000 UNIT) CAPS capsule TAKE 1 (ONE) CAPSULE BY MOUTH ONCE A WEEK 09/14/20 09/14/21  Arvind, [name], MD  ZETIA 10 MG tablet Take 10 mg by mouth daily. 10/18/17   [provider]    Allergies    Aspirin, Augmentin [amoxicillin-pot clavulanate], Dexilant [dexlansoprazole], Hydrocodone-acetaminophen, Influenza vaccines, Levsin [hyoscyamine sulfate], Linzess [linaclotide], Promethazine, Propoxyphene n-acetaminophen, Statins, Acetaminophen, Latex, and Oxycodone-acetaminophen  Review of Systems   Review of Systems  Constitutional:  Negative for chills and fever.  Eyes:  Negative for pain and visual disturbance.  Respiratory:  Positive for shortness of breath. Negative for cough.   Cardiovascular:  Positive for palpitations. Negative for chest pain.  Gastrointestinal:  Negative for abdominal pain and vomiting.  Genitourinary:  Negative for dysuria and hematuria.  Musculoskeletal:  Positive for arthralgias and myalgias.  Skin:  Negative for  color change and rash.  Neurological:  Positive for light-headedness. Negative for syncope.  All other systems reviewed and are negative.  Physical Exam Updated Vital Signs There were no vitals taken for this visit.  Physical Exam Constitutional:      General: She is not in acute distress. HENT:     Head: Normocephalic and atraumatic.  Eyes:     Conjunctiva/sclera: Conjunctivae normal.     Pupils: Pupils are equal, round, and reactive to light.  Cardiovascular:     Rate and Rhythm: Regular rhythm. Tachycardia present.  Pulmonary:     Effort: Pulmonary effort is normal. No respiratory distress.  Abdominal:     General: There is no distension.     Tenderness: There is no abdominal tenderness.  Musculoskeletal:     Right lower leg: No edema.     Left lower leg: No edema.  Skin:    General: Skin is warm and dry.  Neurological:     General: No focal deficit present.     Mental Status: She is alert. Mental status is at baseline.  Psychiatric:        Mood and Affect: Mood normal.        Behavior: Behavior normal.    ED Results / Procedures / Treatments   Labs (all labs ordered are listed, but only abnormal results are displayed) Labs Reviewed - No data to display  EKG None  Radiology No results found.  Procedures Procedures   Medications Ordered in ED Medications - No data to display  ED Course  I have reviewed the triage vital signs and the nursing notes.  Pertinent labs & imaging results that were available during my care of the patient were reviewed by me and considered in my medical decision making (see chart for details).  Patient is here with tachycardia, history of left upper extremity DVT, no longer taking eliquis anticoagulation per discussion with her hematologist (per her report).  She says symptoms been ongoing for several days or weeks.  She has high risk for recurring DVT and possibly PE.  Her blood pressure thankfully is stable.  She has sinus  tachycardia with a heart rate of 122.  Her QTC is normal.  She will need another blood clot work-up.  I ordered a D-dimer, left upper quadrant ultrasound.  I would avoid iodine if she experienced contrast-induced nephropathy on her last hospitalization (although this may have been secondary to her thrombolytic vascular procedure), and we will check her creatinine here.  We will also check a troponin.  We will reassess her.  I personally reviewed her prior medical records as well as  her EKG today.  Of note - she is prescribed propanolol for tachycardia and ativan for anxiety, and has not had either today (propanolol is an evening medicine for her).  This may be contributing to her tachycardia, but it's difficult to determine.  With her significant medical history, I still feel the safest plan would be observation for a V/Q study and further testing. I offered her the option of watchful management at home, but she would also prefer to have the medical workup at this time.  Labs reviewed -Covid negative, CBC and BMP largely unremarkable.  Ddimer minor elevation at 0.65.  Trop's low and flat.  Doubt ACS or massive PE at this time.  Ultrasound does not show new clot in left upper extremity.  Clinical Course as of 06/23/21 1002  Wed Jun 22, 2021  1715 DVT study was negative.  Ddimer mildly elevated.  [MT]  1754 Patient remains persistently tachycardic.  She takes propanolol at night, which he feels as heart rate is higher than normal.  She continues to feel fluttering in her chest.  I think is reasonable at this point to admit her to the hospital for a V/Q study in terms of a pulmonary embolism work-up.  I would also consider an echocardiogram to reevaluate her heart, as she has not had this done in the past few years.  She is amenable and agreeable to this work-up. [MT]  1825 Spoke to hospitalist - requested ortho VS and some IV fluids. [MT]    Clinical Course User Index [MT] Ginna Schuur, Kermit Balo, MD     Final Clinical Impression(s) / ED Diagnoses Final diagnoses:  None    Rx / DC Orders ED Discharge Orders     None        Terald Sleeper, MD 06/23/21 1006

## 2021-06-22 NOTE — ED Notes (Signed)
Pt returned to room  

## 2021-06-22 NOTE — ED Triage Notes (Signed)
Patient BIB [facility] from the GI MD's office where she was following up after an EGD and colonoscopy 6 months ago. Patient c/o for chest pain, SOB, and left arm pain.  This has been ongoing for 2 years since she had a PE.  Patient is scheduled for a stress test next week.  Patient is very anxious.  The GI doctor was concerned due to patient having an inverted T-wave in 2 leads.  Vitals were 186/100 112-HR 100% room air

## 2021-06-22 NOTE — ED Notes (Signed)
ED Provider at bedside. 

## 2021-06-23 ENCOUNTER — Observation Stay (HOSPITAL_BASED_OUTPATIENT_CLINIC_OR_DEPARTMENT_OTHER): Payer: Medicare Other

## 2021-06-23 ENCOUNTER — Observation Stay (HOSPITAL_COMMUNITY): Payer: Medicare Other

## 2021-06-23 ENCOUNTER — Other Ambulatory Visit (HOSPITAL_BASED_OUTPATIENT_CLINIC_OR_DEPARTMENT_OTHER): Payer: Self-pay

## 2021-06-23 DIAGNOSIS — R0600 Dyspnea, unspecified: Secondary | ICD-10-CM

## 2021-06-23 DIAGNOSIS — E0590 Thyrotoxicosis, unspecified without thyrotoxic crisis or storm: Secondary | ICD-10-CM | POA: Diagnosis not present

## 2021-06-23 DIAGNOSIS — R0602 Shortness of breath: Secondary | ICD-10-CM | POA: Diagnosis not present

## 2021-06-23 DIAGNOSIS — R000 Tachycardia, unspecified: Secondary | ICD-10-CM | POA: Diagnosis not present

## 2021-06-23 LAB — CBC WITH DIFFERENTIAL/PLATELET
Abs Immature Granulocytes: 0.01 10*3/uL (ref 0.00–0.07)
Basophils Absolute: 0 10*3/uL (ref 0.0–0.1)
Basophils Relative: 0 %
Eosinophils Absolute: 0.1 10*3/uL (ref 0.0–0.5)
Eosinophils Relative: 3 %
HCT: 32.2 % — ABNORMAL LOW (ref 36.0–46.0)
Hemoglobin: 9.8 g/dL — ABNORMAL LOW (ref 12.0–15.0)
Immature Granulocytes: 0 %
Lymphocytes Relative: 31 %
Lymphs Abs: 1.5 10*3/uL (ref 0.7–4.0)
MCH: 20.5 pg — ABNORMAL LOW (ref 26.0–34.0)
MCHC: 30.4 g/dL (ref 30.0–36.0)
MCV: 67.5 fL — ABNORMAL LOW (ref 80.0–100.0)
Monocytes Absolute: 0.8 10*3/uL (ref 0.1–1.0)
Monocytes Relative: 17 %
Neutro Abs: 2.3 10*3/uL (ref 1.7–7.7)
Neutrophils Relative %: 49 %
Platelets: 201 10*3/uL (ref 150–400)
RBC: 4.77 MIL/uL (ref 3.87–5.11)
RDW: 14.8 % (ref 11.5–15.5)
WBC: 4.7 10*3/uL (ref 4.0–10.5)
nRBC: 0 % (ref 0.0–0.2)

## 2021-06-23 LAB — TSH: TSH: <0.01 u[IU]/mL — ABNORMAL LOW (ref 0.350–4.500)

## 2021-06-23 LAB — COMPREHENSIVE METABOLIC PANEL
ALT: 37 U/L (ref 0–44)
AST: 32 U/L (ref 15–41)
Albumin: 3 g/dL — ABNORMAL LOW (ref 3.5–5.0)
Alkaline Phosphatase: 123 U/L (ref 38–126)
Anion gap: 5 (ref 5–15)
BUN: 19 mg/dL (ref 8–23)
CO2: 24 mmol/L (ref 22–32)
Calcium: 8.7 mg/dL — ABNORMAL LOW (ref 8.9–10.3)
Chloride: 111 mmol/L (ref 98–111)
Creatinine, Ser: 0.9 mg/dL (ref 0.44–1.00)
GFR, Estimated: >60 mL/min (ref >60)
Glucose, Bld: 102 mg/dL — ABNORMAL HIGH (ref 70–99)
Potassium: 3.8 mmol/L (ref 3.5–5.1)
Sodium: 140 mmol/L (ref 135–145)
Total Bilirubin: 0.9 mg/dL (ref 0.3–1.2)
Total Protein: 5.8 g/dL — ABNORMAL LOW (ref 6.5–8.1)

## 2021-06-23 LAB — HIV ANTIBODY (ROUTINE TESTING W REFLEX): HIV Screen 4th Generation wRfx: NONREACTIVE

## 2021-06-23 LAB — MAGNESIUM: Magnesium: 1.8 mg/dL (ref 1.7–2.4)

## 2021-06-23 LAB — PHOSPHORUS: Phosphorus: 3.9 mg/dL (ref 2.5–4.6)

## 2021-06-23 LAB — ECHOCARDIOGRAM COMPLETE
Area-P 1/2: 5.27 cm2
Calc EF: 70.3 %
Height: 68 in
S' Lateral: 2.36 cm
Single Plane A2C EF: 77.1 %
Single Plane A4C EF: 60.7 %
Weight: 3744 oz

## 2021-06-23 MED ORDER — PROPRANOLOL HCL 40 MG PO TABS
40.0000 mg | ORAL_TABLET | Freq: Two times a day (BID) | ORAL | 3 refills | Status: AC
  Filled 2021-06-23 – 2021-12-13 (×2): qty 180, 90d supply, fill #0

## 2021-06-23 MED ORDER — VENLAFAXINE HCL ER 75 MG PO CP24
150.0000 mg | ORAL_CAPSULE | Freq: Every day | ORAL | Status: DC
  Administered 2021-06-23: 150 mg via ORAL
  Filled 2021-06-23: qty 2

## 2021-06-23 MED ORDER — QUETIAPINE FUMARATE 100 MG PO TABS: 100.0000 mg | ORAL_TABLET | Freq: Every day | ORAL | Status: AC

## 2021-06-23 MED ORDER — METHIMAZOLE 5 MG PO TABS: 5.0000 mg | ORAL_TABLET | Freq: Every day | ORAL | Status: DC

## 2021-06-23 MED ORDER — VITAMIN D (ERGOCALCIFEROL) 1.25 MG (50000 UNIT) PO CAPS: 50000.0000 [IU] | ORAL_CAPSULE | ORAL | Status: AC

## 2021-06-23 MED ORDER — OMEPRAZOLE 40 MG PO CPDR: 40.0000 mg | DELAYED_RELEASE_CAPSULE | Freq: Every day | ORAL | Status: AC

## 2021-06-23 MED ORDER — HYDROXYZINE PAMOATE 25 MG PO CAPS: 75.0000 mg | ORAL_CAPSULE | Freq: Every day | ORAL | Status: AC

## 2021-06-23 MED ORDER — QUETIAPINE FUMARATE 50 MG PO TABS: 50.0000 mg | ORAL_TABLET | Freq: Every day | ORAL | Status: AC

## 2021-06-23 MED ORDER — VENLAFAXINE HCL 25 MG PO TABS
25.0000 mg | ORAL_TABLET | Freq: Every evening | ORAL | Status: DC
  Filled 2021-06-23: qty 1

## 2021-06-23 MED ORDER — METHIMAZOLE 10 MG PO TABS
10.0000 mg | ORAL_TABLET | Freq: Every day | ORAL | 1 refills | Status: AC
  Filled 2021-06-23: qty 30, 30d supply, fill #0
  Filled 2021-11-01 – 2022-05-02 (×2): qty 30, 30d supply, fill #1

## 2021-06-23 MED ORDER — SODIUM CHLORIDE 0.9 % IV BOLUS
500.0000 mL | Freq: Once | INTRAVENOUS | Status: AC
  Administered 2021-06-23: 500 mL via INTRAVENOUS

## 2021-06-23 MED ORDER — METHIMAZOLE 10 MG PO TABS
10.0000 mg | ORAL_TABLET | Freq: Every day | ORAL | Status: DC
  Administered 2021-06-23: 10 mg via ORAL
  Filled 2021-06-23: qty 1

## 2021-06-23 MED ORDER — LORAZEPAM 1 MG PO TABS: 1.0000 mg | ORAL_TABLET | Freq: Two times a day (BID) | ORAL | Status: AC

## 2021-06-23 NOTE — ED Notes (Signed)
Patient is resting comfortably. 

## 2021-06-23 NOTE — Progress Notes (Signed)
  Echocardiogram 2D Echocardiogram has been performed.  Janalyn Harder 06/23/2021, 11:37 AM

## 2021-06-23 NOTE — ED Notes (Signed)
US at bedside

## 2021-06-23 NOTE — Discharge Summary (Signed)
DISCHARGE SUMMARY  Heather Hanson  MR#: [id number]  DOB:August 07, 1961  Date of Admission: 06/22/2021 Date of Discharge: 06/23/2021  Attending Physician:Tirso Laws Silvestre Gunner, MD  Patient's BHA:[id number], Heather Salm, MD  Consults: none   Disposition: D/C home   Follow-up Appts:  Follow-up Information     Karle Plumber, MD Follow up in 1 week(s).   Specialty: Internal Medicine Contact information: [phone] [place] [address] [phone]                 Tests Needing Follow-up: -complete evaluation for hyperthyroidism to include considering referral for thyroidectomy -schedule fine needle aspiration of thyroid nodules to rule out malignancy (nodule (TI-RADS 4) located in the inferior left thyroid lobe) -titrate propranolol dose as needed  -assess HR control   Discharge Diagnoses: Hyperthyroidism Sinus tachycardia Thyroid nodules  Hypokalemia Obesity - Body mass index is 35.58 kg/m. Hyperlipidemia GERD  Initial presentation: 60yo with a history of L arm DVT 2021, fibromyalgia, GERD, idiopathic peripheral neuropathy, HLD, hyper and hypothryoidism, and possible prior seizures who was sent to the ED from her GI office when she was found to be tachycardic at 112 with significant shortness of breath.  Of note she had missed her usual scheduled Ativan dose for the day. In the ED venous duplex of the left arm was negative for DVT.  Hospital Course:  Hyperthyroidism - Sinus tachycardia Prior history of same - previously on propranolol, as well as tapazole - also missed Ativan dose on day of onset - no chest pain - VQ scan negative for PE -symptoms resolved with resumption of beta-blocker, dosing of Ativan, and initiation of Tapazole -patient cannot recall exactly when but states she has been on Tapazole in the past -she reports a history of both hyper and hypothyroidism but states during her most recent thyroid eval she was euthyroid -she is safe for discharge home  with beta-blocker and Tapazole at this time -she needs close ongoing follow-up and definitive treatment for this issue as an outpatient -she is established with a PCP and feels confident that she can obtain follow-up in a timely manner -she has been counseled on the absolute need to take this seriously    Thyroid nodules A total of 3 thyroid nodules were identified on thyroid ultrasound, 1 of which meets criteria for fine-needle aspiration -I discussed this finding with the patient, including the fact that cancer needs to be ruled out -I explained to her that biopsy would need to be arranged in the outpatient setting   Hypokalemia Corrected with supplementation    Obesity - Body mass index is 35.58 kg/m.   Hyperlipidemia   GERD  Disposition The patient was happy with the plan to discharge her home -it was confirmed that she would have no difficulty obtaining the medications which were being prescribed -it was confirmed that she has a reliable PCP with whom to follow-up -as noted above she was counseled as to the serious nature of these issues and the absolute need for short-term follow-up to arrange for definitive treatment  Allergies as of 06/23/2021       Reactions   Contrast Media [iodinated Diagnostic Agents] Other (See Comments)   Patient coded   Hydrocodone-acetaminophen Nausea Only   Latex Itching, Rash, Other (See Comments)   "Rash-looks like she has the measles," per patient   Propoxyphene Itching, Rash   Amoxicillin-pot Clavulanate Other (See Comments)   Blisters in the back of throat   Aspirin Other (See Comments)  Patient is to not take, per MD   Linaclotide Diarrhea, Other (See Comments)   Bowel urgency and liquid stools   Dexilant [dexlansoprazole] Other (See Comments)   Bowel urgency and liquid stools   Influenza Vaccines Swelling, Other (See Comments)   Arm swelled to the size of a grapefruit   Levsin [hyoscyamine Sulfate] Other (See Comments)   Projectile  vomitting   Nsaids Other (See Comments)   Patient CANNOT TAKE   Statins Other (See Comments)   Muscle pain   Tape Other (See Comments)   ONLY PAPER TAPE IS TOLERATED- Leaves marks on the skin   Acetaminophen Itching, Rash, Other (See Comments)   Bloody blisters in the throat   Hydrocodone-acetaminophen Hives, Rash   Oxycodone-acetaminophen Itching, Rash   Propoxyphene N-acetaminophen Itching, Rash        Medication List     STOP taking these medications    Florastor 250 MG capsule Generic drug: saccharomyces boulardii       TAKE these medications    hydrALAZINE 50 MG tablet Commonly known as: APRESOLINE Take 1 tablet by mouth twice a day What changed:  when to take this Another medication with the same name was removed. Continue taking this medication, and follow the directions you see here.   HYDROmorphone 4 MG tablet Commonly known as: DILAUDID Take 1 tablet by mouth 2 times daily What changed:  how much to take how to take this when to take this reasons to take this   hydrOXYzine 25 MG capsule Commonly known as: VISTARIL Take 3 capsules (75 mg total) by mouth at bedtime. What changed:  how much to take when to take this Another medication with the same name was removed. Continue taking this medication, and follow the directions you see here.   LORazepam 1 MG tablet Commonly known as: ATIVAN Take 1 tablet (1 mg total) by mouth 2 (two) times daily. What changed:  when to take this reasons to take this Another medication with the same name was removed. Continue taking this medication, and follow the directions you see here.   methimazole 10 MG tablet Commonly known as: TAPAZOLE Take 1 tablet (10 mg total) by mouth daily. Start taking on: June 24, 2021   omeprazole 40 MG capsule Commonly known as: PRILOSEC Take 1 capsule (40 mg total) by mouth at bedtime.   permethrin 5 % cream Commonly known as: ELIMITE APPLY ONE APPLICATION ONE TIME, REPEAT  APPLICATION AFTER 1 WEEK What changed:  how much to take how to take this when to take this additional instructions   propranolol 40 MG tablet Commonly known as: INDERAL Take 1 tablet by mouth 2 times daily What changed:  how much to take when to take this   QUEtiapine 50 MG tablet Commonly known as: SEROQUEL Take 1 tablet (50 mg total) by mouth daily with lunch.   QUEtiapine 100 MG tablet Commonly known as: SEROQUEL Take 1 tablet (100 mg total) by mouth at bedtime.   tacrolimus 0.1 % ointment Commonly known as: PROTOPIC APPLY TO THE AFFECTED AREA OF THE FACE/NECK 2 TIMES DAILY AS NEEDED FOR ITCHING/RASH What changed:  how much to take how to take this when to take this additional instructions   triamcinolone ointment 0.1 % Commonly known as: KENALOG APPLY TO AFFECTED AREA SKIN 2 TIMES DAILY AS NEEDED FOR RASH/ITCHING **NEVER APPLY TO FACE/NECK/GROIN** What changed:  how much to take how to take this when to take this additional instructions   venlafaxine XR 150  MG 24 hr capsule Commonly known as: EFFEXOR-XR TAKE 1 CAPSULE BY MOUTH ONCE DAILY What changed:  how much to take when to take this   venlafaxine 25 MG tablet Commonly known as: EFFEXOR Take 1 tablet by mouth daily, take with 150 mg dose to equal 175 mg daily What changed:  how much to take how to take this when to take this   Vitamin D (Ergocalciferol) 1.25 MG (50000 UNIT) Caps capsule Commonly known as: DRISDOL Take 1 capsule (50,000 Units total) by mouth every 7 (seven) days.        Day of Discharge BP (!) 122/55   Pulse 93   Temp 97.8 F (36.6 C) (Oral)   Resp (!) 22   Ht 5\' 8"  (1.727 m)   Wt 106.1 kg   SpO2 95%   BMI 35.58 kg/m   Physical Exam: General: No acute respiratory distress Lungs: Clear to auscultation bilaterally without wheezes or crackles Cardiovascular: Regular rate and rhythm without murmur gallop or rub normal S1 and S2 Abdomen: Nontender, nondistended, soft,  bowel sounds positive, no rebound, no ascites, no appreciable mass Extremities: No significant cyanosis, clubbing, or edema bilateral lower extremities  Basic Metabolic Panel: Recent Labs  Lab 06/22/21 1603 06/22/21 1750 06/23/21 0436  NA 140  --  140  K 3.4*  --  3.8  CL 106  --  111  CO2 25  --  24  GLUCOSE 109*  --  102*  BUN 21  --  19  CREATININE 0.96  --  0.90  CALCIUM 9.1  --  8.7*  MG  --  1.6* 1.8  PHOS  --   --  3.9    Liver Function Tests: Recent Labs  Lab 06/23/21 0436  AST 32  ALT 37  ALKPHOS 123  BILITOT 0.9  PROT 5.8*  ALBUMIN 3.0*    CBC: Recent Labs  Lab 06/22/21 1603 06/23/21 0436  WBC 8.0 4.7  NEUTROABS 5.5 2.3  HGB 11.6* 9.8*  HCT 38.3 32.2*  MCV 67.4* 67.5*  PLT 226 201    BNP (last 3 results) Recent Labs    06/22/21 1603  BNP 9.4    Recent Results (from the past 240 hour(s))  Resp Panel by RT-PCR (Flu A&B, Covid) Nasopharyngeal Swab     Status: None   Collection Time: 06/22/21  4:04 PM   Specimen: Nasopharyngeal Swab; Nasopharyngeal(NP) swabs in vial transport medium  Result Value Ref Range Status   SARS Coronavirus 2 by RT PCR NEGATIVE NEGATIVE Final    Comment: (NOTE) SARS-CoV-2 target nucleic acids are NOT DETECTED.  The SARS-CoV-2 RNA is generally detectable in upper respiratory specimens during the acute phase of infection. The lowest concentration of SARS-CoV-2 viral copies this assay can detect is 138 copies/mL. A negative result does not preclude SARS-Cov-2 infection and should not be used as the sole basis for treatment or other patient management decisions. A negative result may occur with  improper specimen collection/handling, submission of specimen other than nasopharyngeal swab, presence of viral mutation(s) within the areas targeted by this assay, and inadequate number of viral copies(<138 copies/mL). A negative result must be combined with clinical observations, patient history, and  epidemiological information. The expected result is Negative.  Fact Sheet for Patients:  06/24/21  Fact Sheet for Healthcare Providers:  BloggerCourse.com  This test is no t yet approved or cleared by the [place] FDA and  has been authorized for detection and/or diagnosis of SARS-CoV-2 by FDA under an  Emergency Use Authorization (EUA). This EUA will remain  in effect (meaning this test can be used) for the duration of the COVID-19 declaration under Section 564(b)(1) of the Act, 21 U.S.C.section 360bbb-3(b)(1), unless the authorization is terminated  or revoked sooner.       Influenza A by PCR NEGATIVE NEGATIVE Final   Influenza B by PCR NEGATIVE NEGATIVE Final    Comment: (NOTE) The Xpert Xpress SARS-CoV-2/FLU/RSV plus assay is intended as an aid in the diagnosis of influenza from Nasopharyngeal swab specimens and should not be used as a sole basis for treatment. Nasal washings and aspirates are unacceptable for Xpert Xpress SARS-CoV-2/FLU/RSV testing.  Fact Sheet for Patients: BloggerCourse.comhttps://www.fda.gov/media/152166/download  Fact Sheet for Healthcare Providers: SeriousBroker.ithttps://www.fda.gov/media/152162/download  This test is not yet approved or cleared by the [place] FDA and has been authorized for detection and/or diagnosis of SARS-CoV-2 by FDA under an Emergency Use Authorization (EUA). This EUA will remain in effect (meaning this test can be used) for the duration of the COVID-19 declaration under Section 564(b)(1) of the Act, 21 U.S.C. section 360bbb-3(b)(1), unless the authorization is terminated or revoked.  Performed at [facility], [address] [place], [place] [id number]      Time spent in discharge (includes decision making & examination of pt): 30 minutes  06/23/2021, 12:06 PM   Lonia BloodJeffrey T. Mckynleigh Mussell, MD [facility] Office  [phone]

## 2021-06-24 ENCOUNTER — Other Ambulatory Visit (HOSPITAL_BASED_OUTPATIENT_CLINIC_OR_DEPARTMENT_OTHER): Payer: Self-pay

## 2021-06-24 LAB — THYROID STIMULATING IMMUNOGLOBULIN: Thyroid Stimulating Immunoglob: 4.06 IU/L — ABNORMAL HIGH (ref 0.00–0.55)

## 2021-06-26 LAB — THYROTROPIN RECEPTOR AUTOABS: Thyrotropin Receptor Ab: 7.88 IU/L — ABNORMAL HIGH (ref 0.00–1.75)

## 2021-06-26 LAB — THYROID ANTIBODIES
Thyroglobulin Antibody: <1 IU/mL (ref 0.0–0.9)
Thyroperoxidase Ab SerPl-aCnc: >600 IU/mL — ABNORMAL HIGH (ref 0–34)

## 2021-06-26 LAB — T3, FREE: T3, Free: 12.2 pg/mL — ABNORMAL HIGH (ref 2.0–4.4)

## 2021-06-29 ENCOUNTER — Other Ambulatory Visit (HOSPITAL_BASED_OUTPATIENT_CLINIC_OR_DEPARTMENT_OTHER): Payer: Self-pay

## 2021-07-01 ENCOUNTER — Other Ambulatory Visit (HOSPITAL_BASED_OUTPATIENT_CLINIC_OR_DEPARTMENT_OTHER): Payer: Self-pay

## 2021-07-04 ENCOUNTER — Other Ambulatory Visit (HOSPITAL_BASED_OUTPATIENT_CLINIC_OR_DEPARTMENT_OTHER): Payer: Self-pay

## 2021-07-05 ENCOUNTER — Other Ambulatory Visit (HOSPITAL_BASED_OUTPATIENT_CLINIC_OR_DEPARTMENT_OTHER): Payer: Self-pay

## 2021-07-05 MED ORDER — METHIMAZOLE 10 MG PO TABS
10.0000 mg | ORAL_TABLET | Freq: Two times a day (BID) | ORAL | 2 refills | Status: AC
  Filled 2021-07-20: qty 60, 30d supply, fill #0
  Filled 2021-08-18: qty 60, 30d supply, fill #1
  Filled 2021-09-15: qty 60, 30d supply, fill #2

## 2021-07-05 MED ORDER — HYDROMORPHONE HCL 4 MG PO TABS
ORAL_TABLET | ORAL | 0 refills | Status: DC
  Filled 2021-07-05 – 2021-07-20 (×2): qty 60, 30d supply, fill #0

## 2021-07-08 ENCOUNTER — Other Ambulatory Visit (HOSPITAL_BASED_OUTPATIENT_CLINIC_OR_DEPARTMENT_OTHER): Payer: Self-pay

## 2021-07-20 ENCOUNTER — Other Ambulatory Visit (HOSPITAL_BASED_OUTPATIENT_CLINIC_OR_DEPARTMENT_OTHER): Payer: Self-pay

## 2021-07-20 MED ORDER — QUETIAPINE FUMARATE 100 MG PO TABS
ORAL_TABLET | ORAL | 0 refills | Status: AC
  Filled 2021-07-20 – 2021-08-18 (×2): qty 30, 15d supply, fill #0

## 2021-07-20 MED ORDER — HYDROXYZINE PAMOATE 25 MG PO CAPS
ORAL_CAPSULE | ORAL | 5 refills | Status: DC
  Filled 2021-07-20: qty 90, 30d supply, fill #0
  Filled 2021-08-18: qty 90, 30d supply, fill #1
  Filled 2021-09-15: qty 90, 30d supply, fill #2
  Filled 2021-10-13: qty 90, 30d supply, fill #3
  Filled 2021-12-13: qty 90, 30d supply, fill #4
  Filled 2022-01-31: qty 90, 30d supply, fill #5

## 2021-07-20 MED FILL — Triamcinolone Acetonide Oint 0.1%: CUTANEOUS | 30 days supply | Qty: 454 | Fill #2 | Status: AC

## 2021-07-21 ENCOUNTER — Other Ambulatory Visit (HOSPITAL_BASED_OUTPATIENT_CLINIC_OR_DEPARTMENT_OTHER): Payer: Self-pay

## 2021-07-21 MED ORDER — LORAZEPAM 1 MG PO TABS
1.0000 mg | ORAL_TABLET | Freq: Two times a day (BID) | ORAL | 0 refills | Status: DC | PRN
  Filled 2021-07-21: qty 60, 30d supply, fill #0

## 2021-07-21 MED ORDER — QUETIAPINE FUMARATE 100 MG PO TABS
ORAL_TABLET | ORAL | 0 refills | Status: AC
  Filled 2021-07-21 – 2021-08-18 (×2): qty 30, 30d supply, fill #0

## 2021-07-25 ENCOUNTER — Other Ambulatory Visit (HOSPITAL_BASED_OUTPATIENT_CLINIC_OR_DEPARTMENT_OTHER): Payer: Self-pay

## 2021-08-01 ENCOUNTER — Other Ambulatory Visit (HOSPITAL_BASED_OUTPATIENT_CLINIC_OR_DEPARTMENT_OTHER): Payer: Self-pay

## 2021-08-09 ENCOUNTER — Other Ambulatory Visit (HOSPITAL_BASED_OUTPATIENT_CLINIC_OR_DEPARTMENT_OTHER): Payer: Self-pay

## 2021-08-09 MED ORDER — HYDROMORPHONE HCL 4 MG PO TABS
ORAL_TABLET | ORAL | 0 refills | Status: DC
  Filled 2021-08-09 – 2021-08-18 (×2): qty 60, 30d supply, fill #0

## 2021-08-10 ENCOUNTER — Other Ambulatory Visit (HOSPITAL_BASED_OUTPATIENT_CLINIC_OR_DEPARTMENT_OTHER): Payer: Self-pay

## 2021-08-11 ENCOUNTER — Other Ambulatory Visit (HOSPITAL_BASED_OUTPATIENT_CLINIC_OR_DEPARTMENT_OTHER): Payer: Self-pay

## 2021-08-11 MED ORDER — LORAZEPAM 1 MG PO TABS
1.0000 mg | ORAL_TABLET | Freq: Two times a day (BID) | ORAL | 2 refills | Status: DC | PRN
  Filled 2021-08-11 – 2021-08-18 (×3): qty 60, 30d supply, fill #0
  Filled 2021-09-15: qty 60, 30d supply, fill #1
  Filled 2021-10-13: qty 60, 30d supply, fill #2

## 2021-08-11 MED ORDER — VENLAFAXINE HCL ER 150 MG PO CP24
ORAL_CAPSULE | ORAL | 0 refills | Status: DC
  Filled 2021-08-11: qty 90, 90d supply, fill #0

## 2021-08-11 MED ORDER — PROPRANOLOL HCL 40 MG PO TABS
40.0000 mg | ORAL_TABLET | Freq: Two times a day (BID) | ORAL | 2 refills | Status: AC
  Filled 2021-08-11 – 2021-09-15 (×3): qty 180, 90d supply, fill #0
  Filled 2022-04-10 (×2): qty 180, 90d supply, fill #1

## 2021-08-11 MED ORDER — VENLAFAXINE HCL 25 MG PO TABS
ORAL_TABLET | ORAL | 0 refills | Status: AC
  Filled 2021-08-11: qty 90, 90d supply, fill #0

## 2021-08-11 MED ORDER — QUETIAPINE FUMARATE 100 MG PO TABS
ORAL_TABLET | ORAL | 0 refills | Status: AC
  Filled 2021-08-11 – 2021-11-01 (×2): qty 90, 90d supply, fill #0

## 2021-08-16 ENCOUNTER — Other Ambulatory Visit (HOSPITAL_BASED_OUTPATIENT_CLINIC_OR_DEPARTMENT_OTHER): Payer: Self-pay

## 2021-08-16 MED ORDER — OMEPRAZOLE 40 MG PO CPDR
40.0000 mg | DELAYED_RELEASE_CAPSULE | Freq: Every day | ORAL | 3 refills | Status: AC
  Filled 2021-08-16: qty 90, 90d supply, fill #0
  Filled 2021-10-13 – 2021-11-01 (×2): qty 90, 90d supply, fill #1

## 2021-08-18 ENCOUNTER — Other Ambulatory Visit (HOSPITAL_BASED_OUTPATIENT_CLINIC_OR_DEPARTMENT_OTHER): Payer: Self-pay

## 2021-09-06 ENCOUNTER — Other Ambulatory Visit (HOSPITAL_BASED_OUTPATIENT_CLINIC_OR_DEPARTMENT_OTHER): Payer: Self-pay

## 2021-09-12 ENCOUNTER — Other Ambulatory Visit (HOSPITAL_BASED_OUTPATIENT_CLINIC_OR_DEPARTMENT_OTHER): Payer: Self-pay

## 2021-09-12 MED ORDER — FERROUS GLUCONATE 324 (37.5 FE) MG PO TABS
1.0000 | ORAL_TABLET | Freq: Every day | ORAL | 0 refills | Status: AC
  Filled 2021-09-12: qty 90, 90d supply, fill #0

## 2021-09-13 ENCOUNTER — Other Ambulatory Visit (HOSPITAL_BASED_OUTPATIENT_CLINIC_OR_DEPARTMENT_OTHER): Payer: Self-pay

## 2021-09-15 ENCOUNTER — Other Ambulatory Visit (HOSPITAL_BASED_OUTPATIENT_CLINIC_OR_DEPARTMENT_OTHER): Payer: Self-pay

## 2021-09-16 ENCOUNTER — Other Ambulatory Visit (HOSPITAL_BASED_OUTPATIENT_CLINIC_OR_DEPARTMENT_OTHER): Payer: Self-pay

## 2021-09-16 MED ORDER — HYDROMORPHONE HCL 4 MG PO TABS
ORAL_TABLET | ORAL | 0 refills | Status: AC
  Filled 2021-09-16: qty 60, 30d supply, fill #0

## 2021-09-26 ENCOUNTER — Other Ambulatory Visit (HOSPITAL_BASED_OUTPATIENT_CLINIC_OR_DEPARTMENT_OTHER): Payer: Self-pay

## 2021-10-04 ENCOUNTER — Other Ambulatory Visit (HOSPITAL_BASED_OUTPATIENT_CLINIC_OR_DEPARTMENT_OTHER): Payer: Self-pay

## 2021-10-04 MED ORDER — HYDROMORPHONE HCL 4 MG PO TABS
ORAL_TABLET | ORAL | 0 refills | Status: AC
  Filled 2021-10-17: qty 60, 30d supply, fill #0

## 2021-10-04 MED ORDER — WEGOVY 0.25 MG/0.5ML ~~LOC~~ SOAJ
SUBCUTANEOUS | 0 refills | Status: AC
  Filled 2021-10-04 – 2022-01-11 (×5): qty 2, 28d supply, fill #0

## 2021-10-04 MED ORDER — METHIMAZOLE 5 MG PO TABS
ORAL_TABLET | ORAL | 2 refills | Status: DC
  Filled 2021-10-04: qty 90, 30d supply, fill #0
  Filled 2021-11-01: qty 90, 30d supply, fill #1
  Filled 2021-12-13: qty 90, 30d supply, fill #2

## 2021-10-05 ENCOUNTER — Other Ambulatory Visit (HOSPITAL_BASED_OUTPATIENT_CLINIC_OR_DEPARTMENT_OTHER): Payer: Self-pay

## 2021-10-10 ENCOUNTER — Other Ambulatory Visit (HOSPITAL_BASED_OUTPATIENT_CLINIC_OR_DEPARTMENT_OTHER): Payer: Self-pay

## 2021-10-11 ENCOUNTER — Other Ambulatory Visit (HOSPITAL_BASED_OUTPATIENT_CLINIC_OR_DEPARTMENT_OTHER): Payer: Self-pay

## 2021-10-13 ENCOUNTER — Other Ambulatory Visit (HOSPITAL_BASED_OUTPATIENT_CLINIC_OR_DEPARTMENT_OTHER): Payer: Self-pay

## 2021-10-14 ENCOUNTER — Other Ambulatory Visit (HOSPITAL_BASED_OUTPATIENT_CLINIC_OR_DEPARTMENT_OTHER): Payer: Self-pay

## 2021-10-17 ENCOUNTER — Other Ambulatory Visit (HOSPITAL_BASED_OUTPATIENT_CLINIC_OR_DEPARTMENT_OTHER): Payer: Self-pay

## 2021-10-19 ENCOUNTER — Other Ambulatory Visit (HOSPITAL_BASED_OUTPATIENT_CLINIC_OR_DEPARTMENT_OTHER): Payer: Self-pay

## 2021-10-21 ENCOUNTER — Other Ambulatory Visit (HOSPITAL_BASED_OUTPATIENT_CLINIC_OR_DEPARTMENT_OTHER): Payer: Self-pay

## 2021-10-24 ENCOUNTER — Other Ambulatory Visit (HOSPITAL_BASED_OUTPATIENT_CLINIC_OR_DEPARTMENT_OTHER): Payer: Self-pay

## 2021-10-31 ENCOUNTER — Other Ambulatory Visit (HOSPITAL_BASED_OUTPATIENT_CLINIC_OR_DEPARTMENT_OTHER): Payer: Self-pay

## 2021-11-01 ENCOUNTER — Other Ambulatory Visit (HOSPITAL_BASED_OUTPATIENT_CLINIC_OR_DEPARTMENT_OTHER): Payer: Self-pay

## 2021-11-01 MED ORDER — HYDROMORPHONE HCL 4 MG PO TABS
ORAL_TABLET | ORAL | 0 refills | Status: AC
  Filled 2021-11-01: qty 60, 30d supply, fill #0

## 2021-11-09 ENCOUNTER — Other Ambulatory Visit (HOSPITAL_BASED_OUTPATIENT_CLINIC_OR_DEPARTMENT_OTHER): Payer: Self-pay

## 2021-11-10 ENCOUNTER — Other Ambulatory Visit (HOSPITAL_BASED_OUTPATIENT_CLINIC_OR_DEPARTMENT_OTHER): Payer: Self-pay

## 2021-11-10 MED ORDER — VENLAFAXINE HCL ER 150 MG PO CP24
ORAL_CAPSULE | ORAL | 0 refills | Status: DC
  Filled 2021-12-13: qty 90, 90d supply, fill #0

## 2021-11-10 MED ORDER — QUETIAPINE FUMARATE 100 MG PO TABS
ORAL_TABLET | ORAL | 0 refills | Status: AC
  Filled 2022-01-31: qty 90, 90d supply, fill #0

## 2021-11-10 MED ORDER — VENLAFAXINE HCL 25 MG PO TABS
ORAL_TABLET | ORAL | 0 refills | Status: AC
  Filled 2021-12-13: qty 90, 90d supply, fill #0

## 2021-11-10 MED ORDER — LORAZEPAM 1 MG PO TABS
1.0000 mg | ORAL_TABLET | Freq: Two times a day (BID) | ORAL | 2 refills | Status: AC | PRN
  Filled 2021-11-10 – 2021-11-11 (×2): qty 60, 30d supply, fill #0
  Filled 2021-12-09: qty 60, 30d supply, fill #1
  Filled 2022-01-09: qty 60, 30d supply, fill #2

## 2021-11-11 ENCOUNTER — Other Ambulatory Visit (HOSPITAL_BASED_OUTPATIENT_CLINIC_OR_DEPARTMENT_OTHER): Payer: Self-pay

## 2021-11-22 ENCOUNTER — Other Ambulatory Visit (HOSPITAL_BASED_OUTPATIENT_CLINIC_OR_DEPARTMENT_OTHER): Payer: Self-pay

## 2021-11-29 ENCOUNTER — Other Ambulatory Visit (HOSPITAL_BASED_OUTPATIENT_CLINIC_OR_DEPARTMENT_OTHER): Payer: Self-pay

## 2021-11-29 MED ORDER — HYDROMORPHONE HCL 4 MG PO TABS
ORAL_TABLET | ORAL | 0 refills | Status: AC
  Filled 2021-11-29: qty 60, 30d supply, fill #0

## 2021-12-08 ENCOUNTER — Emergency Department (HOSPITAL_BASED_OUTPATIENT_CLINIC_OR_DEPARTMENT_OTHER): Payer: Medicare Other

## 2021-12-08 ENCOUNTER — Other Ambulatory Visit: Payer: Self-pay

## 2021-12-08 ENCOUNTER — Encounter (HOSPITAL_BASED_OUTPATIENT_CLINIC_OR_DEPARTMENT_OTHER): Payer: Self-pay

## 2021-12-08 ENCOUNTER — Emergency Department (HOSPITAL_BASED_OUTPATIENT_CLINIC_OR_DEPARTMENT_OTHER)
Admission: EM | Admit: 2021-12-08 | Discharge: 2021-12-09 | Disposition: A | Discharge status: Home / Self Care | Payer: Medicare Other | Attending: Emergency Medicine | Admitting: Emergency Medicine

## 2021-12-08 DIAGNOSIS — R0789 Other chest pain: Secondary | ICD-10-CM | POA: Insufficient documentation

## 2021-12-08 DIAGNOSIS — R202 Paresthesia of skin: Secondary | ICD-10-CM | POA: Diagnosis not present

## 2021-12-08 DIAGNOSIS — I6522 Occlusion and stenosis of left carotid artery: Secondary | ICD-10-CM | POA: Insufficient documentation

## 2021-12-08 DIAGNOSIS — Z79899 Other long term (current) drug therapy: Secondary | ICD-10-CM | POA: Insufficient documentation

## 2021-12-08 DIAGNOSIS — R200 Anesthesia of skin: Secondary | ICD-10-CM

## 2021-12-08 DIAGNOSIS — M5412 Radiculopathy, cervical region: Secondary | ICD-10-CM

## 2021-12-08 DIAGNOSIS — Z91040 Latex allergy status: Secondary | ICD-10-CM | POA: Diagnosis not present

## 2021-12-08 DIAGNOSIS — E039 Hypothyroidism, unspecified: Secondary | ICD-10-CM | POA: Diagnosis not present

## 2021-12-08 DIAGNOSIS — Z794 Long term (current) use of insulin: Secondary | ICD-10-CM | POA: Insufficient documentation

## 2021-12-08 DIAGNOSIS — Z20822 Contact with and (suspected) exposure to covid-19: Secondary | ICD-10-CM | POA: Insufficient documentation

## 2021-12-08 LAB — CBC
HCT: 42.3 % (ref 36.0–46.0)
Hemoglobin: 13.4 g/dL (ref 12.0–15.0)
MCH: 22 pg — ABNORMAL LOW (ref 26.0–34.0)
MCHC: 31.7 g/dL (ref 30.0–36.0)
MCV: 69.5 fL — ABNORMAL LOW (ref 80.0–100.0)
Platelets: 238 10*3/uL (ref 150–400)
RBC: 6.09 MIL/uL — ABNORMAL HIGH (ref 3.87–5.11)
RDW: 17.6 % — ABNORMAL HIGH (ref 11.5–15.5)
WBC: 6.7 10*3/uL (ref 4.0–10.5)
nRBC: 0 % (ref 0.0–0.2)

## 2021-12-08 LAB — COMPREHENSIVE METABOLIC PANEL
ALT: 16 U/L (ref 0–44)
AST: 26 U/L (ref 15–41)
Albumin: 4.1 g/dL (ref 3.5–5.0)
Alkaline Phosphatase: 120 U/L (ref 38–126)
Anion gap: 11 (ref 5–15)
BUN: 14 mg/dL (ref 8–23)
CO2: 21 mmol/L — ABNORMAL LOW (ref 22–32)
Calcium: 9.3 mg/dL (ref 8.9–10.3)
Chloride: 103 mmol/L (ref 98–111)
Creatinine, Ser: 0.91 mg/dL (ref 0.44–1.00)
GFR, Estimated: >60 mL/min (ref >60)
Glucose, Bld: 94 mg/dL (ref 70–99)
Potassium: 3.9 mmol/L (ref 3.5–5.1)
Sodium: 135 mmol/L (ref 135–145)
Total Bilirubin: 0.6 mg/dL (ref 0.3–1.2)
Total Protein: 7.9 g/dL (ref 6.5–8.1)

## 2021-12-08 LAB — DIFFERENTIAL
Abs Immature Granulocytes: 0.02 10*3/uL (ref 0.00–0.07)
Basophils Absolute: 0 10*3/uL (ref 0.0–0.1)
Basophils Relative: 1 %
Eosinophils Absolute: 0.2 10*3/uL (ref 0.0–0.5)
Eosinophils Relative: 3 %
Immature Granulocytes: 0 %
Lymphocytes Relative: 30 %
Lymphs Abs: 2 10*3/uL (ref 0.7–4.0)
Monocytes Absolute: 0.5 10*3/uL (ref 0.1–1.0)
Monocytes Relative: 7 %
Neutro Abs: 4 10*3/uL (ref 1.7–7.7)
Neutrophils Relative %: 59 %

## 2021-12-08 LAB — URINALYSIS, ROUTINE W REFLEX MICROSCOPIC
Bilirubin Urine: NEGATIVE
Glucose, UA: NEGATIVE mg/dL
Ketones, ur: NEGATIVE mg/dL
Leukocytes,Ua: NEGATIVE
Nitrite: NEGATIVE
Protein, ur: NEGATIVE mg/dL
Specific Gravity, Urine: 1.02 (ref 1.005–1.030)
pH: 7 (ref 5.0–8.0)

## 2021-12-08 LAB — RAPID URINE DRUG SCREEN, HOSP PERFORMED
Amphetamines: NOT DETECTED
Barbiturates: NOT DETECTED
Benzodiazepines: POSITIVE — AB
Cocaine: NOT DETECTED
Opiates: POSITIVE — AB
Tetrahydrocannabinol: POSITIVE — AB

## 2021-12-08 LAB — URINALYSIS, MICROSCOPIC (REFLEX)

## 2021-12-08 LAB — PROTIME-INR
INR: 1 (ref 0.8–1.2)
Prothrombin Time: 12.7 seconds (ref 11.4–15.2)

## 2021-12-08 LAB — RESP PANEL BY RT-PCR (FLU A&B, COVID) ARPGX2
Influenza A by PCR: NEGATIVE
Influenza B by PCR: NEGATIVE
SARS Coronavirus 2 by RT PCR: NEGATIVE

## 2021-12-08 LAB — TROPONIN I (HIGH SENSITIVITY)
Troponin I (High Sensitivity): 5 ng/L (ref <18)
Troponin I (High Sensitivity): 6 ng/L (ref <18)

## 2021-12-08 LAB — CBG MONITORING, ED: Glucose-Capillary: 91 mg/dL (ref 70–99)

## 2021-12-08 LAB — APTT: aPTT: 25 seconds (ref 24–36)

## 2021-12-08 LAB — ETHANOL: Alcohol, Ethyl (B): <10 mg/dL (ref <10)

## 2021-12-08 MED ORDER — HYDRALAZINE HCL 25 MG PO TABS
25.0000 mg | ORAL_TABLET | Freq: Once | ORAL | Status: AC
  Administered 2021-12-08: 19:00:00 25 mg via ORAL
  Filled 2021-12-08: qty 1

## 2021-12-08 MED ORDER — ONDANSETRON HCL 4 MG/2ML IJ SOLN: 4.0000 mg | Freq: Once | INTRAMUSCULAR | Status: AC

## 2021-12-08 MED ORDER — ONDANSETRON HCL 4 MG/2ML IJ SOLN
INTRAMUSCULAR | Status: AC
  Administered 2021-12-08: 18:00:00 4 mg via INTRAVENOUS
  Filled 2021-12-08: qty 2

## 2021-12-08 MED ORDER — MORPHINE SULFATE (PF) 4 MG/ML IV SOLN
4.0000 mg | Freq: Once | INTRAVENOUS | Status: AC
  Administered 2021-12-08: 18:00:00 4 mg via INTRAVENOUS
  Filled 2021-12-08: qty 1

## 2021-12-08 MED ORDER — ONDANSETRON HCL 4 MG/2ML IJ SOLN
4.0000 mg | Freq: Once | INTRAMUSCULAR | Status: AC
  Administered 2021-12-08: 23:00:00 4 mg via INTRAVENOUS
  Filled 2021-12-08: qty 2

## 2021-12-08 MED ORDER — METOPROLOL TARTRATE 25 MG PO TABS
50.0000 mg | ORAL_TABLET | Freq: Once | ORAL | Status: AC
  Administered 2021-12-08: 22:00:00 50 mg via ORAL
  Filled 2021-12-08: qty 2

## 2021-12-08 NOTE — Consult Note (Addendum)
[facility] Telemedicine Consult   Requesting Provider: Dr. 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  Per patient, she had a left arm thrombectomy in 2019 but has been doing well without pain since.  However 1 week ago he was preparing for Christmas decorations and she started to have left arm pain.  The pain somehow tolerable and she does not want come to the ER due to the holiday season.  Today about 3 to 4 hours prior to ER presentation, she had a bath and after the bath she had acute worsening of left arm pain.  The pain started from left side of neck and shooting down to the left shoulder left arm to left elbow.  It was so painful and she was crying in the ER.  The pain getting worse with movement.  She denies any speech difficulty, facial droop, leg pain or weakness. CT no acute abnormalities by report. Glucose 97.  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 She can not tell whether her neck pain was worse or not today but certainly the pain shooting down her left arm.   She does complain of back pain today especially with b/l leg raising up from bed.   LKW: 3-4 hours ago with worsening are pain tpa given?: No, outside window and likely not stroke IR Thrombectomy? No, likely not stroke  Modified Rankin Scale: 1-No significant post stroke disability and can perform usual duties with stroke symptoms   Exam: Vitals:   12/08/21 1615 12/08/21 1619  BP: (!) 155/123   Pulse: 96   Resp: (!) 24   Temp:  98.3 F (36.8 C)  SpO2: 100%      Temp:  [98.3 F (36.8 C)] 98.3 F (36.8 C) (12/29  1619) Pulse Rate:  [96] 96 (12/29 1615) Resp:  [24] 24 (12/29 1615) BP: (155)/(123) 155/123 (12/29 1615) SpO2:  [100 %] 100 % (12/29 1615) Weight:  [108.9 kg] 108.9 kg (12/29 1616)  General - Well nourished, well developed, in acute distress of pain.  Ophthalmologic - fundi not visualized due to noncooperation.  Cardiovascular - Regular rhythm and rate.  Neuro - awake, alert, eyes open, orientated to age, place, time and people. No aphasia, fluent language, following all simple commands. Able to name and repeat. No gaze palsy, tracking bilaterally, visual field full, PERRL. No facial droop. Tongue midline. RUE 5/5, left UE with pain on movement, but able to raise against gravity but at slightly lower level than right, but no drift. Bilaterally LEs 3/5, no drift, but complaining of back pain with legs raising up. Sensation decreased on the left arm and leg, right FTN intact, gait not tested.    NIH Stroke Scale = 1 with left sided sensation decrease   Imaging Reviewed:  Not able to see the CT images by myself. I contacted radiology department, they are going to re-push CT images.  However, they told me that Dr. Alfredo Batty has been read the CT and reported no acute abnormality.  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  Pain described as originating from left neck shooting down to left shoulder and left arm and elbow.  Also complaining of back pain with legs raising up against gravity.  On exam, no focal deficit except decreased light touch sensation on the left arm and left leg.  Left arm tremendous pain but able to resolve against gravity  with no drift.  CT no acute abnormality.  NIH score 1 with sensation.  Patient not a tPA candidate given also window and likely not stroke.  Etiology more concerning for cervical spine radiculopathy.  Recommend pain management per EDP, transferred to [facility] for C-spine and MRI brain.   Recommendations:  Left arm pain management per EDP Transfer to [facility] for MRI brain and C-spine Discussed with Dr. Audley Hose ED physician We remain available for further consultation if changes in history, signs or symptoms indicate the need.    Consult Participants: Patient, MD, RN Location of the provider: [facility] Location of the patient: [facility]  Time Code Stroke Page received:  1628 Time neurologist arrived:  1638 Time NIHSS completed: 1655    This consult was provided via telemedicine with 2-way video and audio communication. The patient/family was informed that care would be provided in this way and agreed to receive care in this manner.   This patient is receiving care for possible acute neurological changes. There was 45 minutes of care by this provider at the time of service, including time for direct evaluation via telemedicine, review of medical records, imaging studies and discussion of findings with providers, the patient and/or family.  Marvel Plan, MD PhD Stroke Neurology 12/08/2021 5:04 PM

## 2021-12-08 NOTE — ED Triage Notes (Signed)
Pt c/o left arm pain & numbness x 1 week, worsened today. Thought she had a "strain". States hx of blood clot in left arm with thrombectomy. Also having left sided facial numbness started at 1400. Pain to arm with movement

## 2021-12-08 NOTE — ED Notes (Signed)
Report given to [facility] Lonnie °

## 2021-12-08 NOTE — ED Notes (Signed)
Dr. Roda Shutters on teleneuro with patient performing neuro exam

## 2021-12-08 NOTE — ED Provider Notes (Signed)
[facility] EMERGENCY DEPARTMENT Provider Note   CSN: [id number] Arrival date & time: 12/08/21  1551     History Chief Complaint  Patient presents with   Arm Pain   Numbness    Heather Hanson is a 60 y.o. female.  Patient is tearful, crying and anxious presenting to the ER with left upper extremity pain and numbness ongoing for a week.  She states that she has a history of DVT in the left upper extremity with embolectomy performed.  She been doing well until about a week ago had recurrent similar symptoms of swelling pain and numbness.  However she presents ER today because she noticed numbness to the left side of her face as well that started just 2 hours prior to arrival.  Denies lower extremity weakness or numbness.  Denies any fevers or cough or vomiting diarrhea denies any fall or trauma.  Also complaining of chest tightness ongoing for the past day as well.      Past Medical History:  Diagnosis Date   Adjustment disorder with mixed anxiety and depressed mood 09/28/2009   Qualifier: Diagnosis of  By: Nena Jordan    Allergic state 04/09/2013   Anemia    thalassemia   Anxiety    takes Ativan daily prn   Arthritis    sees Dr.Beekman   Bursitis of right shoulder    Cholecystitis, acute 08/30/2013   Chronic insomnia    Complication of anesthesia    hard to sedate   Constipation    takes Linzess daily prn   Depression    takes Effexor daily   Difficulty swallowing solids    DVT (deep venous thrombosis) (HCC) 01/31/2018   LUE   Fibromyalgia    GERD (gastroesophageal reflux disease) 06/17/2013   takes Dexilant daily prn   H. pylori infection 07/19/2013   H/O hiatal hernia    Headache(784.0) 05/22/2013   Hereditary and idiopathic peripheral neuropathy 03/14/2015   History of bronchitis    last time many yrs ago   History of colonoscopy 2000   Hyperlipidemia    not on any meds    Hypoglycemia    Hypothyroidism    Lichen    Dr.Fleisher at [facility]    Migraines    takes Topamax daily-last migraine today   Muscle spasms of head and/or neck    takes Norflex daily prn   Nausea    takes Phenergan prn   Obesity    Peripheral edema    takes Lasix prn   PONV (postoperative nausea and vomiting)    with tonsil removal surgery   PUD (peptic ulcer disease)    Dr Loreta Ave ( 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 pylori infection  07/19/2013   GERD (gastroesophageal reflux disease) 06/17/2013   Headache(784.0) 05/22/2013   Sinusitis, acute 04/09/2013   Allergic state 04/09/2013   Back pain 01/27/2012   Annual physical exam 09/30/2011   Microcytosis 123456   Lichen planus 123456   Polyarthralgia 09/30/2011   DERMATITIS 12/15/2010   Constipation 11/23/2009   FIBROMYALGIA 11/09/2009   NAUSEA 11/09/2009   Hyperlipidemia, mixed 09/28/2009   Obesity 09/28/2009   Anemia 09/28/2009   Depression with anxiety 09/28/2009   Seizure ([place]) 09/28/2009    Past Surgical History:  Procedure Laterality Date   ABDOMINAL HYSTERECTOMY  2000   partial   CHOLECYSTECTOMY N/A 09/16/2013   Procedure: LAPAROSCOPIC CHOLECYSTECTOMY;  Surgeon: Rolm Bookbinder, MD;  Location: [place];  Service: General;  Laterality: N/A;   COLONOSCOPY      IR FLUORO GUIDE CV LINE RIGHT  02/06/2018   IR US GUIDE VASC ACCESS RIGHT  02/06/2018   PERIPHERAL VASCULAR THROMBECTOMY Left 01/31/2018   Procedure: PERIPHERAL VASCULAR THROMBECTOMY;  Surgeon: Conrad Fredonia, MD;  Location: [place] CV LAB;  Service: Cardiovascular;  Laterality: Left;   REMOVAL OF CERVICAL EXOSTOSIS N/A 06/08/2016   Procedure: REMOVAL OF  EXOSTOSIS ANTERIOR CERVICAL SPINE;  Surgeon: Melina Schools, MD;  Location: [place];  Service: Orthopedics;  Laterality: N/A;   TONSILLECTOMY  1972     OB History   No obstetric history on file.     Family History  Problem Relation Age of Onset   Diabetes Mother        typer 2   Heart disease Mother        PVD, PAD   Cancer Father        prostate, lung, brain mets, smoker   COPD Father    Hyperlipidemia Father    Hypertension Father    Thyroid disease Father    Hypertension Sister    Thyroid disease Sister    Arthritis Sister    Diabetes Son 44       type 1   Diabetes Maternal Grandmother    Stroke Maternal Grandfather    Cancer Paternal Grandmother 56       breast   Alcohol abuse Paternal Grandfather    Hypertension Sister    Obesity Sister    Psoriasis Son    Cancer Other        Breast < 81 yo, ,Prostate <50 yo   Coronary artery disease Other        <60 yo   Diabetes Other    Breast cancer Neg Hx     Social History   Tobacco Use   Smoking status: Never   Smokeless tobacco: Never  Vaping Use   Vaping Use: Never used  Substance Use Topics   Alcohol use: Yes    Comment: rare use   Drug use: No    Comment: Rare    Home Medications Prior to Admission medications   Medication Sig Start Date End Date Taking? Authorizing Provider  Ferrous Gluconate 324 (37.5 Fe) MG TABS Take 1 tablet by mouth daily 09/10/21     hydrALAZINE (APRESOLINE) 50 MG tablet Take 1 tablet by mouth twice a day 06/21/21     HYDROmorphone (DILAUDID) 4 MG tablet Take 1 tablet by mouth 2 times daily 09/16/21     HYDROmorphone (DILAUDID) 4 MG  tablet Take 1 tablet by mouth 2 times daily 10/14/21     HYDROmorphone (DILAUDID) 4 MG tablet Take 1 (one) Tablet by mouth two times daily 11/01/21  HYDROmorphone (DILAUDID) 4 MG tablet Take 1 tablet by mouth 2 times daily 11/29/21     hydrOXYzine (VISTARIL) 25 MG capsule Take 3 capsules (75 mg total) by mouth at bedtime. 06/23/21   Lonia BloodMcClung, Jeffrey T, MD  hydrOXYzine (VISTARIL) 25 MG capsule Take 1 capsule (25 mg total) by mouth 3 (three)  times daily as needed for Itching. 07/20/21     LORazepam (ATIVAN) 1 MG tablet Take 1 tablet (1 mg total) by mouth 2 (two) times daily. 06/23/21   Lonia BloodMcClung, Jeffrey T, MD  LORazepam (ATIVAN) 1 MG tablet Take 1 Tablet by mouth two times daily, as needed 11/10/21     methimazole (TAPAZOLE) 10 MG tablet Take 1 tablet (10 mg total) by mouth daily. 06/24/21   Lonia BloodMcClung, Jeffrey T, MD  methimazole (TAPAZOLE) 10 MG tablet Take 1 (one) tablet by mouth two times daily 07/05/21     methimazole (TAPAZOLE) 5 MG tablet Take 1 tablet by mouth 3 times daily as needed 10/04/21     omeprazole (PRILOSEC) 40 MG capsule Take 1 capsule (40 mg total) by mouth at bedtime. 06/23/21 06/23/22  Lonia BloodMcClung, Jeffrey T, MD  omeprazole (PRILOSEC) 40 MG capsule Take 1 capsule (40 mg total) by mouth daily. 08/16/21     propranolol (INDERAL) 40 MG tablet Take 1 tablet by mouth 2 times daily 06/23/21   Lonia BloodMcClung, Jeffrey T, MD  propranolol (INDERAL) 40 MG tablet Take 1 (one) Tablet by mouth two times daily 08/11/21     QUEtiapine (SEROQUEL) 100 MG tablet Take 1 tablet (100 mg total) by mouth at bedtime. 06/23/21   Lonia BloodMcClung, Jeffrey T, MD  QUEtiapine (SEROQUEL) 100 MG tablet Take 1-2 tablets by mouth at bedtime 07/20/21     QUEtiapine (SEROQUEL) 100 MG tablet Take 1 Tablet by mouth at bedtime 07/21/21     QUEtiapine (SEROQUEL) 100 MG tablet Take 1 tablet by mouth every night at bedtime 08/11/21     QUEtiapine (SEROQUEL) 100 MG tablet Take 1 tablet by mouth every night at bedtime 11/10/21     QUEtiapine (SEROQUEL) 50 MG  tablet Take 1 tablet (50 mg total) by mouth daily with lunch. 06/23/21 06/23/22  Lonia BloodMcClung, Jeffrey T, MD  Semaglutide-Weight Management (WEGOVY) 0.25 MG/0.5ML SOAJ Inject 0.665mls under the skin once weekly 10/04/21     tacrolimus (PROTOPIC) 0.1 % ointment APPLY TO THE AFFECTED AREA OF THE FACE/NECK 2 TIMES DAILY AS NEEDED FOR ITCHING/RASH 01/11/21 01/11/22  McPeeks, Marcelo BaldyKimberly Kincaid, PA-C  triamcinolone ointment (KENALOG) 0.1 % APPLY TO AFFECTED AREA SKIN 2 TIMES DAILY AS NEEDED FOR RASH/ITCHING **NEVER APPLY TO FACE/NECK/GROIN** 01/11/21 01/11/22  McPeeks, Marcelo BaldyKimberly Kincaid, PA-C  venlafaxine (EFFEXOR) 25 MG tablet Take 1 tablet by mouth daily, take with 150 mg dose to equal 175 mg daily 08/11/21     venlafaxine (EFFEXOR) 25 MG tablet Take 1 tablet by mouth daily, take with 150 mg dose to equal 175 mg daily 11/10/21     venlafaxine XR (EFFEXOR-XR) 150 MG 24 hr capsule TAKE 1 CAPSULE BY MOUTH ONCE DAILY 03/01/21 03/01/22  Kevin FentonAmeh, Mary Habiba, FNP  venlafaxine XR (EFFEXOR-XR) 150 MG 24 hr capsule Take 1 capsule by mouth daily 11/10/21     Vitamin D, Ergocalciferol, (DRISDOL) 1.25 MG (50000 UNIT) CAPS capsule Take 1 capsule (50,000 Units total) by mouth every 7 (seven) days. 06/23/21 06/23/22  Lonia BloodMcClung, Jeffrey T, MD  gabapentin (NEURONTIN) 100 MG capsule TAKE 3 CAPSULES BY MOUTH EVERY NIGHT AT BEDTIME Patient not taking: No sig reported 01/11/21 06/23/21  McPeeks, Cala BradfordKimberly  Kincaid, PA-C    Allergies    Contrast media [iodinated contrast media], Hydrocodone-acetaminophen, Latex, Propoxyphene, Amoxicillin-pot clavulanate, Aspirin, Linaclotide, Dexilant [dexlansoprazole], Influenza vaccines, Levsin [hyoscyamine sulfate], Nsaids, Statins, Tape, Acetaminophen, Hydrocodone-acetaminophen, Oxycodone-acetaminophen, and Propoxyphene n-acetaminophen  Review of Systems   Review of Systems  Constitutional:  Negative for fever.  HENT:  Negative for ear pain.   Eyes:  Negative for pain.  Respiratory:  Negative for cough.    Cardiovascular:  Positive for chest pain.  Gastrointestinal:  Negative for abdominal pain.  Genitourinary:  Negative for flank pain.  Musculoskeletal:  Negative for back pain.  Skin:  Negative for rash.  Neurological:  Negative for headaches.   Physical Exam Updated Vital Signs BP (!) 191/86    Pulse 84    Temp 98.3 F (36.8 C) (Oral)    Resp 18    Ht 5\' 8"  (1.727 m)    Wt 108.9 kg    SpO2 96%    BMI 36.49 kg/m   Physical Exam Constitutional:      General: She is not in acute distress.    Appearance: Normal appearance.  HENT:     Head: Normocephalic.     Nose: Nose normal.  Eyes:     Extraocular Movements: Extraocular movements intact.  Cardiovascular:     Rate and Rhythm: Normal rate.  Pulmonary:     Effort: Pulmonary effort is normal.  Musculoskeletal:     Cervical back: Normal range of motion.  Neurological:     General: No focal deficit present.     Mental Status: She is alert. Mental status is at baseline.    ED Results / Procedures / Treatments   Labs (all labs ordered are listed, but only abnormal results are displayed) Labs Reviewed  CBC - Abnormal; Notable for the following components:      Result Value   RBC 6.09 (*)    MCV 69.5 (*)    MCH 22.0 (*)    RDW 17.6 (*)    All other components within normal limits  COMPREHENSIVE METABOLIC PANEL - Abnormal; Notable for the following components:   CO2 21 (*)    All other components within normal limits  RAPID URINE DRUG SCREEN, HOSP PERFORMED - Abnormal; Notable for the following components:   Opiates POSITIVE (*)    Benzodiazepines POSITIVE (*)    Tetrahydrocannabinol POSITIVE (*)    All other components within normal limits  URINALYSIS, ROUTINE W REFLEX MICROSCOPIC - Abnormal; Notable for the following components:   Hgb urine dipstick SMALL (*)    All other components within normal limits  URINALYSIS, MICROSCOPIC (REFLEX) - Abnormal; Notable for the following components:   Bacteria, UA FEW (*)    All  other components within normal limits  RESP PANEL BY RT-PCR (FLU A&B, COVID) ARPGX2  ETHANOL  PROTIME-INR  APTT  DIFFERENTIAL  CBG MONITORING, ED  TROPONIN I (HIGH SENSITIVITY)  TROPONIN I (HIGH SENSITIVITY)    EKG EKG Interpretation  Date/Time:  Thursday December 08 2021 16:10:14 EST Ventricular Rate:  86 PR Interval:  157 QRS Duration: 85 QT Interval:  359 QTC Calculation: 430 R Axis:   55 Text Interpretation: Sinus rhythm RSR' in V1 or V2, right VCD or RVH Confirmed by Thamas Jaegers ([id number]) on 12/08/2021 5:01:44 PM  Radiology US Venous Img Upper Uni Left  Result Date: 12/08/2021 CLINICAL DATA:  Pain and numbness EXAM: Left UPPER EXTREMITY VENOUS DOPPLER ULTRASOUND TECHNIQUE: Gray-scale sonography with graded compression, as well as color Doppler and duplex ultrasound  were performed to evaluate the upper extremity deep venous system from the level of the subclavian vein and including the jugular, axillary, basilic, radial, ulnar and upper cephalic vein. Spectral Doppler was utilized to evaluate flow at rest and with distal augmentation maneuvers. COMPARISON:  None. FINDINGS: Contralateral Subclavian Vein: Respiratory phasicity is normal and symmetric with the symptomatic side. No evidence of thrombus. Normal compressibility. Internal Jugular Vein: No evidence of thrombus. Normal compressibility, respiratory phasicity and response to augmentation. Subclavian Vein: No evidence of thrombus. Normal compressibility, respiratory phasicity and response to augmentation. Axillary Vein: No evidence of thrombus. Normal compressibility, respiratory phasicity and response to augmentation. Cephalic Vein: No evidence of thrombus. Normal compressibility, respiratory phasicity and response to augmentation. Basilic Vein: No evidence of thrombus. Normal compressibility, respiratory phasicity and response to augmentation. Brachial Veins: No evidence of thrombus. Normal compressibility, respiratory phasicity  and response to augmentation. Radial Veins: No evidence of thrombus. Normal compressibility, respiratory phasicity and response to augmentation. Ulnar Veins: No evidence of thrombus. Normal compressibility, respiratory phasicity and response to augmentation. IMPRESSION: No evidence of DVT within the  upper extremity. Electronically Signed   By: Donavan Foil M.D.   On: 12/08/2021 21:15   CT HEAD CODE STROKE WO CONTRAST  Result Date: 12/08/2021 CLINICAL DATA:  Code stroke. Neuro deficit acute, stroke suspected. Left arm pain and numbness. Personal history of thrombus in the left upper extremity. EXAM: CT HEAD WITHOUT CONTRAST TECHNIQUE: Contiguous axial images were obtained from the base of the skull through the vertex without intravenous contrast. COMPARISON:  MRI head without contrast 06/29/2012. FINDINGS: Brain: Focal area of hypoattenuation in the anterior limb right internal capsule is chronic. No acute infarct, hemorrhage, or mass lesion is present. No significant white matter lesions are present. Basal ganglia are otherwise within normal limits. The ventricles are of normal size. No significant extraaxial fluid collection is present. The brainstem and cerebellum are within normal limits. Vascular: No hyperdense vessel or unexpected calcification. Skull: Calvarium is intact. No focal lytic or blastic lesions are present. No significant extracranial soft tissue lesion is present. Sinuses/Orbits: The paranasal sinuses and mastoid air cells are clear. The globes and orbits are within normal limits. ASPECTS [facility] Stroke Program Early CT Score) - Ganglionic level infarction (caudate, lentiform nuclei, internal capsule, insula, M1-M3 cortex): 7/7 - Supraganglionic infarction (M4-M6 cortex): 3/3 Total score (0-10 with 10 being normal): 10/10 IMPRESSION: 1. No acute intracranial abnormality or significant interval change. 2. Aspects 10/10 3. These results were called by telephone at the time of interpretation  on 12/08/2021 at 4:42 pm to provider [facility] , who verbally acknowledged these results. Electronically Signed   By: San Morelle M.D.   On: 12/08/2021 16:42    Procedures Procedures   Medications Ordered in ED Medications  metoprolol tartrate (LOPRESSOR) tablet 50 mg (has no administration in time range)  morphine 4 MG/ML injection 4 mg (4 mg Intravenous Given 12/08/21 1750)  ondansetron (ZOFRAN) injection 4 mg (4 mg Intravenous Given 12/08/21 1750)  hydrALAZINE (APRESOLINE) tablet 25 mg (25 mg Oral Given 12/08/21 1849)    ED Course  I have reviewed the triage vital signs and the nursing notes.  Pertinent labs & imaging results that were available during my care of the patient were reviewed by me and considered in my medical decision making (see chart for details).    MDM Rules/Calculators/A&P  Patient states her left arm numbness was ongoing for a week but she had left face numbness started about 2 hours prior to arrival.  Unclear given her history she had a DVT in left upper extremity which ultimately led to stroke resulting in left facial numbness acutely.  Case referred to on-call stroke neurologist DrXu.  CT scan of the brain is unremarkable.  Recommended no tPA given this is more likely radicular pain and onset appears to be perhaps a week ago with a left upper extremity numbness.  Commending MRI of the brain and C-spine.  Ultrasound of left upper extremity pursued given the patient's concern she has recurrent DVT but this is unremarkable.  Patient be transferred to [facility] for MRI of the brain and C-spine.      Final Clinical Impression(s) / ED Diagnoses Final diagnoses:  Numbness    Rx / DC Orders ED Discharge Orders     None        Luna Fuse, MD 12/08/21 2128

## 2021-12-08 NOTE — ED Notes (Signed)
Pt arrive via [facility] for MRI from [facility]. Pt c/o pain 8/10. MD at bedside.

## 2021-12-08 NOTE — ED Notes (Signed)
Transported pt to CT.

## 2021-12-08 NOTE — ED Notes (Signed)
[facility] MD aware of pt BP. No new orders or changes. Pt resting in bed with call light in reach.

## 2021-12-08 NOTE — ED Notes (Signed)
Report called to [facility] at [facility] ED

## 2021-12-08 NOTE — ED Notes (Signed)
Pt having conversation with this nurse, moving left arm without difficulty while she is speaking.

## 2021-12-08 NOTE — ED Provider Notes (Signed)
10:51 PM Patient transferred from [facility]. Is having pain/numbness in left arm. Here for MRI.  She is also crying due to nausea and would like some nausea medicine.  MRI pending.  11:14 PM Care to Dr. Clayborne Dana with MRI pending   Pricilla Loveless, MD 12/08/21 2314

## 2021-12-09 ENCOUNTER — Emergency Department (HOSPITAL_COMMUNITY): Payer: Medicare Other

## 2021-12-09 ENCOUNTER — Other Ambulatory Visit (HOSPITAL_BASED_OUTPATIENT_CLINIC_OR_DEPARTMENT_OTHER): Payer: Self-pay

## 2021-12-09 DIAGNOSIS — R202 Paresthesia of skin: Secondary | ICD-10-CM | POA: Diagnosis not present

## 2021-12-09 MED ORDER — MECLIZINE HCL 25 MG PO TABS
25.0000 mg | ORAL_TABLET | Freq: Once | ORAL | Status: AC
  Administered 2021-12-09: 05:00:00 25 mg via ORAL
  Filled 2021-12-09: qty 1

## 2021-12-09 MED ORDER — PREDNISONE 20 MG PO TABS
ORAL_TABLET | ORAL | 0 refills | Status: AC
  Filled 2021-12-09: qty 27, 15d supply, fill #0

## 2021-12-09 MED ORDER — ONDANSETRON HCL 4 MG/2ML IJ SOLN
4.0000 mg | Freq: Once | INTRAMUSCULAR | Status: AC
  Administered 2021-12-09: 05:00:00 4 mg via INTRAVENOUS
  Filled 2021-12-09: qty 2

## 2021-12-09 MED ORDER — METHYLPREDNISOLONE SODIUM SUCC 125 MG IJ SOLR
125.0000 mg | Freq: Once | INTRAMUSCULAR | Status: AC
  Administered 2021-12-09: 05:00:00 125 mg via INTRAVENOUS
  Filled 2021-12-09: qty 2

## 2021-12-09 MED ORDER — MECLIZINE HCL 25 MG PO TABS
25.0000 mg | ORAL_TABLET | Freq: Three times a day (TID) | ORAL | 0 refills | Status: AC | PRN
  Filled 2021-12-09: qty 30, 10d supply, fill #0

## 2021-12-09 MED ORDER — LORAZEPAM 2 MG/ML IJ SOLN
2.0000 mg | Freq: Once | INTRAMUSCULAR | Status: AC
  Administered 2021-12-09: 01:00:00 2 mg via INTRAVENOUS
  Filled 2021-12-09: qty 1

## 2021-12-09 NOTE — ED Provider Notes (Signed)
4:47 AM Assumed care from Drs. [place] and [place], please see their note for full history, physical and decision making until this point. In brief this is a 60 y.o. year old female who presented to the ED tonight with Arm Pain and Numbness     Here for MRI to rule out cervical abnormalities and stroke. Was prettty hypertensive, probably related to pain but added on angio as well. Patient adamantly refused contrast for MRA (because of ct contrast allergy) and thus was not able to get a great study but within those limitations no obvious abnromalities. Will treat as cervical radiculopathy with steroid taper and NSG follow up.   Discharge instructions, including strict return precautions for new or worsening symptoms, given. Patient and/or family verbalized understanding and agreement with the plan as described.   Labs, studies and imaging reviewed by myself and considered in medical decision making if ordered. Imaging interpreted by radiology.  Labs Reviewed  CBC - Abnormal; Notable for the following components:      Result Value   RBC 6.09 (*)    MCV 69.5 (*)    MCH 22.0 (*)    RDW 17.6 (*)    All other components within normal limits  COMPREHENSIVE METABOLIC PANEL - Abnormal; Notable for the following components:   CO2 21 (*)    All other components within normal limits  RAPID URINE DRUG SCREEN, HOSP PERFORMED - Abnormal; Notable for the following components:   Opiates POSITIVE (*)    Benzodiazepines POSITIVE (*)    Tetrahydrocannabinol POSITIVE (*)    All other components within normal limits  URINALYSIS, ROUTINE W REFLEX MICROSCOPIC - Abnormal; Notable for the following components:   Hgb urine dipstick SMALL (*)    All other components within normal limits  URINALYSIS, MICROSCOPIC (REFLEX) - Abnormal; Notable for the following components:   Bacteria, UA FEW (*)    All other components within normal limits  RESP PANEL BY RT-PCR (FLU A&B, COVID) ARPGX2  ETHANOL  PROTIME-INR  APTT   DIFFERENTIAL  CBG MONITORING, ED  TROPONIN I (HIGH SENSITIVITY)  TROPONIN I (HIGH SENSITIVITY)    MR BRAIN WO CONTRAST  Final Result    MR Cervical Spine Wo Contrast  Final Result    MR ANGIO NECK WO CONTRAST  Final Result    US Venous Img Upper Uni Left  Final Result    CT HEAD CODE STROKE WO CONTRAST  Final Result      No follow-ups on file.    Marily Memos, MD 12/09/21 [id number]

## 2021-12-09 NOTE — ED Notes (Signed)
Pt returned from MRI, this RN in to assess pt. Pt resting comfortably-denies needs at this time. Pt did refuse contrast based off of previous reaction to CT Dye.MD [facility]ner aware

## 2021-12-09 NOTE — ED Notes (Signed)
Patient transported to MRI 

## 2021-12-09 NOTE — Discharge Instructions (Addendum)
You have some plaque buildup on the carotid artery in your left side of your neck.  This is unlikely related to your symptoms.  It is not at the point where would need intervention but should be something that your doctor can follow-up for you and/or refer you to vascular surgery if needed.

## 2021-12-09 NOTE — Plan of Care (Signed)
MRI and MRA done - per report:  Brain MRI:   No emergent finding or cause for symptoms.   Cervical MRI:   1. Degenerative foraminal stenosis bilaterally at C3-4 and to a lesser degree on the right at C2-3. Diffusely patent spinal canal. 2. C4-5 solid fusion.  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  MRA head:  Atheromatous narrowing at the left more than right proximal ICA with 40-50% stenosis on the left.    Pt symptoms and exam still more consistent with cervical radiculopathy. No acute intervention needed from neurology at this time. Agree with EDP to follow up with NSG and vascular surgery as outpt.   Marvel Plan, MD PhD Stroke Neurology 12/09/2021 7:28 AM

## 2021-12-13 ENCOUNTER — Other Ambulatory Visit: Payer: Self-pay | Admitting: Internal Medicine

## 2021-12-13 ENCOUNTER — Other Ambulatory Visit (HOSPITAL_BASED_OUTPATIENT_CLINIC_OR_DEPARTMENT_OTHER): Payer: Self-pay

## 2021-12-13 DIAGNOSIS — Z1231 Encounter for screening mammogram for malignant neoplasm of breast: Secondary | ICD-10-CM

## 2021-12-21 ENCOUNTER — Other Ambulatory Visit (HOSPITAL_BASED_OUTPATIENT_CLINIC_OR_DEPARTMENT_OTHER): Payer: Self-pay

## 2021-12-22 ENCOUNTER — Other Ambulatory Visit (HOSPITAL_BASED_OUTPATIENT_CLINIC_OR_DEPARTMENT_OTHER): Payer: Self-pay

## 2021-12-23 ENCOUNTER — Other Ambulatory Visit (HOSPITAL_BASED_OUTPATIENT_CLINIC_OR_DEPARTMENT_OTHER): Payer: Self-pay

## 2021-12-26 ENCOUNTER — Other Ambulatory Visit (HOSPITAL_BASED_OUTPATIENT_CLINIC_OR_DEPARTMENT_OTHER): Payer: Self-pay

## 2021-12-28 DIAGNOSIS — Z1231 Encounter for screening mammogram for malignant neoplasm of breast: Secondary | ICD-10-CM

## 2022-01-03 ENCOUNTER — Other Ambulatory Visit (HOSPITAL_BASED_OUTPATIENT_CLINIC_OR_DEPARTMENT_OTHER): Payer: Self-pay

## 2022-01-03 MED ORDER — HYDROMORPHONE HCL 4 MG PO TABS
ORAL_TABLET | ORAL | 0 refills | Status: AC
  Filled 2022-01-03 (×2): qty 90, 30d supply, fill #0

## 2022-01-03 MED ORDER — VENLAFAXINE HCL ER 150 MG PO CP24: ORAL_CAPSULE | ORAL | 0 refills | Status: AC

## 2022-01-09 ENCOUNTER — Other Ambulatory Visit (HOSPITAL_BASED_OUTPATIENT_CLINIC_OR_DEPARTMENT_OTHER): Payer: Self-pay

## 2022-01-11 ENCOUNTER — Other Ambulatory Visit (HOSPITAL_BASED_OUTPATIENT_CLINIC_OR_DEPARTMENT_OTHER): Payer: Self-pay

## 2022-01-31 ENCOUNTER — Other Ambulatory Visit (HOSPITAL_BASED_OUTPATIENT_CLINIC_OR_DEPARTMENT_OTHER): Payer: Self-pay

## 2022-01-31 MED ORDER — METHIMAZOLE 5 MG PO TABS
ORAL_TABLET | ORAL | 2 refills | Status: AC
  Filled 2022-01-31: qty 90, 30d supply, fill #0

## 2022-01-31 MED ORDER — HYDROMORPHONE HCL 4 MG PO TABS: ORAL_TABLET | ORAL | 0 refills | Status: AC

## 2022-02-08 ENCOUNTER — Other Ambulatory Visit (HOSPITAL_BASED_OUTPATIENT_CLINIC_OR_DEPARTMENT_OTHER): Payer: Self-pay

## 2022-02-08 MED ORDER — QUETIAPINE FUMARATE 100 MG PO TABS: ORAL_TABLET | ORAL | 0 refills | Status: AC

## 2022-02-08 MED ORDER — LORAZEPAM 1 MG PO TABS
ORAL_TABLET | ORAL | 2 refills | Status: DC
  Filled 2022-02-08: qty 60, 30d supply, fill #0
  Filled 2022-03-10: qty 60, 30d supply, fill #1
  Filled 2022-04-10: qty 60, 30d supply, fill #2

## 2022-02-08 MED ORDER — VENLAFAXINE HCL 25 MG PO TABS
ORAL_TABLET | ORAL | 0 refills | Status: AC
  Filled 2022-02-08: qty 90, 90d supply, fill #0

## 2022-02-08 MED ORDER — VENLAFAXINE HCL ER 150 MG PO CP24
ORAL_CAPSULE | ORAL | 0 refills | Status: AC
  Filled 2022-02-08: qty 180, 90d supply, fill #0

## 2022-03-07 ENCOUNTER — Other Ambulatory Visit (HOSPITAL_BASED_OUTPATIENT_CLINIC_OR_DEPARTMENT_OTHER): Payer: Self-pay

## 2022-03-07 MED ORDER — OMEPRAZOLE 40 MG PO CPDR
DELAYED_RELEASE_CAPSULE | ORAL | 3 refills | Status: DC
  Filled 2022-03-07: qty 90, 90d supply, fill #0
  Filled 2022-06-06: qty 90, 90d supply, fill #1
  Filled 2022-09-05: qty 90, 90d supply, fill #2
  Filled 2022-12-06: qty 90, 90d supply, fill #3

## 2022-03-07 MED ORDER — HYDROMORPHONE HCL 4 MG PO TABS
ORAL_TABLET | ORAL | 0 refills | Status: AC
  Filled 2022-03-07: qty 60, 30d supply, fill #0

## 2022-03-07 MED ORDER — HYDROXYZINE PAMOATE 25 MG PO CAPS
25.0000 mg | ORAL_CAPSULE | Freq: Three times a day (TID) | ORAL | 2 refills | Status: AC | PRN
  Filled 2022-03-07: qty 90, 30d supply, fill #0
  Filled 2022-05-11: qty 90, 30d supply, fill #1
  Filled 2022-06-06: qty 90, 30d supply, fill #2

## 2022-03-10 ENCOUNTER — Other Ambulatory Visit (HOSPITAL_BASED_OUTPATIENT_CLINIC_OR_DEPARTMENT_OTHER): Payer: Self-pay

## 2022-04-04 ENCOUNTER — Other Ambulatory Visit (HOSPITAL_BASED_OUTPATIENT_CLINIC_OR_DEPARTMENT_OTHER): Payer: Self-pay

## 2022-04-04 MED ORDER — HYDROMORPHONE HCL 4 MG PO TABS
ORAL_TABLET | ORAL | 0 refills | Status: AC
  Filled 2022-04-10: qty 60, 30d supply, fill #0

## 2022-04-10 ENCOUNTER — Other Ambulatory Visit (HOSPITAL_BASED_OUTPATIENT_CLINIC_OR_DEPARTMENT_OTHER): Payer: Self-pay

## 2022-04-10 MED ORDER — HYDRALAZINE HCL 50 MG PO TABS
ORAL_TABLET | ORAL | 5 refills | Status: AC
  Filled 2022-04-10: qty 60, 30d supply, fill #0
  Filled 2022-05-11: qty 60, 30d supply, fill #1
  Filled 2022-09-05: qty 60, 30d supply, fill #2

## 2022-04-11 ENCOUNTER — Other Ambulatory Visit (HOSPITAL_BASED_OUTPATIENT_CLINIC_OR_DEPARTMENT_OTHER): Payer: Self-pay

## 2022-05-02 ENCOUNTER — Other Ambulatory Visit (HOSPITAL_BASED_OUTPATIENT_CLINIC_OR_DEPARTMENT_OTHER): Payer: Self-pay

## 2022-05-02 MED ORDER — HYDROMORPHONE HCL 4 MG PO TABS
ORAL_TABLET | ORAL | 0 refills | Status: AC
  Filled 2022-05-09: qty 60, 30d supply, fill #0

## 2022-05-02 MED ORDER — DOCUSATE SODIUM 100 MG PO CAPS
ORAL_CAPSULE | ORAL | 3 refills | Status: AC
  Filled 2022-05-02: qty 100, 50d supply, fill #0

## 2022-05-02 MED ORDER — PROPRANOLOL HCL 40 MG PO TABS
40.0000 mg | ORAL_TABLET | Freq: Three times a day (TID) | ORAL | 3 refills | Status: DC
  Filled 2022-05-02 – 2022-06-06 (×3): qty 270, 90d supply, fill #0
  Filled 2022-09-05: qty 270, 90d supply, fill #1

## 2022-05-02 MED ORDER — VENLAFAXINE HCL ER 150 MG PO CP24
ORAL_CAPSULE | ORAL | 0 refills | Status: AC
  Filled 2022-05-02: qty 180, 90d supply, fill #0

## 2022-05-02 MED ORDER — QUETIAPINE FUMARATE 100 MG PO TABS
ORAL_TABLET | ORAL | 0 refills | Status: AC
  Filled 2022-05-02: qty 90, 90d supply, fill #0

## 2022-05-02 MED ORDER — LORAZEPAM 1 MG PO TABS
ORAL_TABLET | ORAL | 0 refills | Status: AC
  Filled 2022-05-02 – 2022-05-09 (×2): qty 60, 30d supply, fill #0

## 2022-05-03 ENCOUNTER — Other Ambulatory Visit (HOSPITAL_BASED_OUTPATIENT_CLINIC_OR_DEPARTMENT_OTHER): Payer: Self-pay

## 2022-05-03 MED ORDER — CEFDINIR 300 MG PO CAPS
ORAL_CAPSULE | ORAL | 0 refills | Status: AC
  Filled 2022-05-03: qty 14, 7d supply, fill #0

## 2022-05-09 ENCOUNTER — Other Ambulatory Visit (HOSPITAL_BASED_OUTPATIENT_CLINIC_OR_DEPARTMENT_OTHER): Payer: Self-pay

## 2022-05-11 ENCOUNTER — Other Ambulatory Visit (HOSPITAL_BASED_OUTPATIENT_CLINIC_OR_DEPARTMENT_OTHER): Payer: Self-pay

## 2022-05-11 MED ORDER — VENLAFAXINE HCL ER 150 MG PO CP24
150.0000 mg | ORAL_CAPSULE | Freq: Every day | ORAL | 0 refills | Status: AC
  Filled 2022-11-07: qty 43, 43d supply, fill #0
  Filled 2022-11-07: qty 47, 47d supply, fill #0

## 2022-05-11 MED ORDER — QUETIAPINE FUMARATE 100 MG PO TABS
100.0000 mg | ORAL_TABLET | Freq: Every day | ORAL | 0 refills | Status: AC
  Filled 2022-11-07: qty 90, 90d supply, fill #0

## 2022-05-11 MED ORDER — LORAZEPAM 1 MG PO TABS
ORAL_TABLET | ORAL | 2 refills | Status: AC
  Filled 2022-05-11 – 2022-06-08 (×2): qty 60, 30d supply, fill #0
  Filled 2022-07-07: qty 60, 30d supply, fill #1
  Filled 2022-08-04: qty 60, 30d supply, fill #2

## 2022-05-22 ENCOUNTER — Other Ambulatory Visit (HOSPITAL_COMMUNITY): Payer: Self-pay

## 2022-05-24 LAB — EXTERNAL GENERIC LAB PROCEDURE: COLOGUARD: NEGATIVE

## 2022-06-06 ENCOUNTER — Other Ambulatory Visit (HOSPITAL_BASED_OUTPATIENT_CLINIC_OR_DEPARTMENT_OTHER): Payer: Self-pay

## 2022-06-06 MED ORDER — METHIMAZOLE 5 MG PO TABS
ORAL_TABLET | ORAL | 3 refills | Status: AC
  Filled 2022-06-06: qty 90, 30d supply, fill #0
  Filled 2022-07-14: qty 90, 30d supply, fill #1
  Filled 2022-09-05: qty 87, 29d supply, fill #2

## 2022-06-06 MED ORDER — HYDROMORPHONE HCL 4 MG PO TABS
ORAL_TABLET | ORAL | 0 refills | Status: AC
  Filled 2022-06-06: qty 60, fill #0

## 2022-06-08 ENCOUNTER — Other Ambulatory Visit (HOSPITAL_BASED_OUTPATIENT_CLINIC_OR_DEPARTMENT_OTHER): Payer: Self-pay

## 2022-07-05 ENCOUNTER — Other Ambulatory Visit (HOSPITAL_BASED_OUTPATIENT_CLINIC_OR_DEPARTMENT_OTHER): Payer: Self-pay

## 2022-07-05 MED ORDER — PROPRANOLOL HCL 80 MG PO TABS
80.0000 mg | ORAL_TABLET | Freq: Three times a day (TID) | ORAL | 3 refills | Status: DC
  Filled 2022-07-05: qty 270, 90d supply, fill #0
  Filled 2022-10-23: qty 270, 90d supply, fill #1
  Filled 2023-02-01: qty 270, 90d supply, fill #2
  Filled 2023-04-17: qty 270, 90d supply, fill #3

## 2022-07-05 MED ORDER — HYDROMORPHONE HCL 4 MG PO TABS
4.0000 mg | ORAL_TABLET | Freq: Two times a day (BID) | ORAL | 0 refills | Status: AC
  Filled 2022-07-05: qty 60, 30d supply, fill #0

## 2022-07-05 MED ORDER — PERMETHRIN 5 % EX CREA
TOPICAL_CREAM | CUTANEOUS | 0 refills | Status: AC
  Filled 2022-07-05: qty 60, 30d supply, fill #0

## 2022-07-06 ENCOUNTER — Other Ambulatory Visit (HOSPITAL_BASED_OUTPATIENT_CLINIC_OR_DEPARTMENT_OTHER): Payer: Self-pay

## 2022-07-07 ENCOUNTER — Other Ambulatory Visit (HOSPITAL_BASED_OUTPATIENT_CLINIC_OR_DEPARTMENT_OTHER): Payer: Self-pay

## 2022-07-14 ENCOUNTER — Other Ambulatory Visit (HOSPITAL_BASED_OUTPATIENT_CLINIC_OR_DEPARTMENT_OTHER): Payer: Self-pay

## 2022-08-04 ENCOUNTER — Other Ambulatory Visit (HOSPITAL_BASED_OUTPATIENT_CLINIC_OR_DEPARTMENT_OTHER): Payer: Self-pay

## 2022-08-10 ENCOUNTER — Other Ambulatory Visit (HOSPITAL_BASED_OUTPATIENT_CLINIC_OR_DEPARTMENT_OTHER): Payer: Self-pay

## 2022-08-10 MED ORDER — VENLAFAXINE HCL ER 150 MG PO CP24
150.0000 mg | ORAL_CAPSULE | Freq: Every day | ORAL | 0 refills | Status: AC
  Filled 2022-08-10: qty 90, 90d supply, fill #0

## 2022-08-10 MED ORDER — LORAZEPAM 1 MG PO TABS
1.0000 mg | ORAL_TABLET | Freq: Two times a day (BID) | ORAL | 2 refills | Status: AC | PRN
  Filled 2022-08-10 – 2023-02-02 (×3): qty 60, 30d supply, fill #0
  Filled 2023-02-02: qty 39, 19d supply, fill #0
  Filled 2023-02-02: qty 21, 11d supply, fill #0

## 2022-08-10 MED ORDER — QUETIAPINE FUMARATE 100 MG PO TABS
100.0000 mg | ORAL_TABLET | Freq: Every day | ORAL | 0 refills | Status: AC
  Filled 2022-08-10: qty 90, 90d supply, fill #0

## 2022-08-10 MED ORDER — METHIMAZOLE 5 MG PO TABS
5.0000 mg | ORAL_TABLET | Freq: Three times a day (TID) | ORAL | 3 refills | Status: AC | PRN
  Filled 2022-08-10: qty 90, 30d supply, fill #0
  Filled 2022-10-23: qty 90, 30d supply, fill #1
  Filled 2023-02-01: qty 90, 30d supply, fill #2

## 2022-08-10 MED ORDER — HYDROMORPHONE HCL 4 MG PO TABS
4.0000 mg | ORAL_TABLET | Freq: Two times a day (BID) | ORAL | 0 refills | Status: AC
  Filled 2022-08-10: qty 60, 30d supply, fill #0

## 2022-09-05 ENCOUNTER — Other Ambulatory Visit (HOSPITAL_BASED_OUTPATIENT_CLINIC_OR_DEPARTMENT_OTHER): Payer: Self-pay

## 2022-09-05 MED ORDER — LORAZEPAM 1 MG PO TABS
1.0000 mg | ORAL_TABLET | Freq: Two times a day (BID) | ORAL | 2 refills | Status: AC | PRN
  Filled 2022-09-05: qty 60, 30d supply, fill #0
  Filled 2022-11-07: qty 60, 30d supply, fill #1
  Filled 2022-12-06: qty 60, 30d supply, fill #2

## 2022-09-05 MED ORDER — HYDROMORPHONE HCL 4 MG PO TABS
4.0000 mg | ORAL_TABLET | Freq: Two times a day (BID) | ORAL | 0 refills | Status: AC
  Filled 2022-11-07: qty 60, 30d supply, fill #0

## 2022-09-28 ENCOUNTER — Other Ambulatory Visit (HOSPITAL_BASED_OUTPATIENT_CLINIC_OR_DEPARTMENT_OTHER): Payer: Self-pay

## 2022-10-05 ENCOUNTER — Other Ambulatory Visit (HOSPITAL_BASED_OUTPATIENT_CLINIC_OR_DEPARTMENT_OTHER): Payer: Self-pay

## 2022-10-05 MED ORDER — LORAZEPAM 1 MG PO TABS
1.0000 mg | ORAL_TABLET | Freq: Two times a day (BID) | ORAL | 0 refills | Status: AC | PRN
  Filled 2022-10-05: qty 60, 30d supply, fill #0

## 2022-10-05 MED ORDER — HYDROMORPHONE HCL 4 MG PO TABS
4.0000 mg | ORAL_TABLET | Freq: Two times a day (BID) | ORAL | 0 refills | Status: AC
  Filled 2022-10-05: qty 60, 30d supply, fill #0

## 2022-10-23 ENCOUNTER — Other Ambulatory Visit (HOSPITAL_BASED_OUTPATIENT_CLINIC_OR_DEPARTMENT_OTHER): Payer: Self-pay

## 2022-11-07 ENCOUNTER — Other Ambulatory Visit (HOSPITAL_BASED_OUTPATIENT_CLINIC_OR_DEPARTMENT_OTHER): Payer: Self-pay

## 2022-11-07 MED ORDER — VENLAFAXINE HCL ER 150 MG PO CP24
150.0000 mg | ORAL_CAPSULE | Freq: Every day | ORAL | 3 refills | Status: AC
  Filled 2022-11-07: qty 90, 90d supply, fill #0

## 2022-11-07 MED ORDER — LORAZEPAM 1 MG PO TABS
1.0000 mg | ORAL_TABLET | Freq: Two times a day (BID) | ORAL | 0 refills | Status: AC | PRN
  Filled 2022-11-07 – 2023-01-03 (×2): qty 60, 30d supply, fill #0

## 2022-11-07 MED ORDER — QUETIAPINE FUMARATE 100 MG PO TABS
100.0000 mg | ORAL_TABLET | Freq: Every day | ORAL | 3 refills | Status: AC
  Filled 2022-11-07 – 2023-02-01 (×2): qty 90, 90d supply, fill #0
  Filled 2023-04-17: qty 90, 90d supply, fill #1
  Filled 2023-06-04: qty 90, 90d supply, fill #2
  Filled 2023-08-29: qty 90, 90d supply, fill #3

## 2022-11-07 MED ORDER — HYDROMORPHONE HCL 4 MG PO TABS
4.0000 mg | ORAL_TABLET | Freq: Two times a day (BID) | ORAL | 0 refills | Status: AC
  Filled 2022-11-07: qty 60, 30d supply, fill #0

## 2022-11-20 ENCOUNTER — Other Ambulatory Visit (HOSPITAL_BASED_OUTPATIENT_CLINIC_OR_DEPARTMENT_OTHER): Payer: Self-pay

## 2022-11-20 MED ORDER — VRAYLAR 1.5 MG PO CAPS
1.5000 mg | ORAL_CAPSULE | Freq: Every day | ORAL | 1 refills | Status: AC
  Filled 2022-11-20: qty 30, 30d supply, fill #0

## 2022-11-20 MED ORDER — LORAZEPAM 1 MG PO TABS
1.0000 mg | ORAL_TABLET | Freq: Two times a day (BID) | ORAL | 1 refills | Status: AC | PRN
  Filled 2022-11-20: qty 60, 30d supply, fill #0

## 2022-11-20 MED ORDER — VENLAFAXINE HCL ER 150 MG PO CP24
150.0000 mg | ORAL_CAPSULE | Freq: Every day | ORAL | 3 refills | Status: AC
  Filled 2022-11-20: qty 90, 90d supply, fill #0

## 2022-11-28 ENCOUNTER — Other Ambulatory Visit (HOSPITAL_BASED_OUTPATIENT_CLINIC_OR_DEPARTMENT_OTHER): Payer: Self-pay

## 2022-11-28 MED ORDER — HYDROMORPHONE HCL 4 MG PO TABS
4.0000 mg | ORAL_TABLET | Freq: Two times a day (BID) | ORAL | 0 refills | Status: AC
  Filled 2022-11-28 – 2022-12-05 (×2): qty 60, 30d supply, fill #0
  Filled ????-??-??: fill #0

## 2022-11-30 ENCOUNTER — Other Ambulatory Visit (HOSPITAL_BASED_OUTPATIENT_CLINIC_OR_DEPARTMENT_OTHER): Payer: Self-pay

## 2022-12-05 ENCOUNTER — Other Ambulatory Visit (HOSPITAL_BASED_OUTPATIENT_CLINIC_OR_DEPARTMENT_OTHER): Payer: Self-pay

## 2022-12-06 ENCOUNTER — Other Ambulatory Visit (HOSPITAL_BASED_OUTPATIENT_CLINIC_OR_DEPARTMENT_OTHER): Payer: Self-pay

## 2022-12-27 ENCOUNTER — Other Ambulatory Visit (HOSPITAL_BASED_OUTPATIENT_CLINIC_OR_DEPARTMENT_OTHER): Payer: Self-pay

## 2022-12-27 MED ORDER — HYDROXYZINE PAMOATE 25 MG PO CAPS
25.0000 mg | ORAL_CAPSULE | Freq: Three times a day (TID) | ORAL | 5 refills | Status: AC | PRN
  Filled 2022-12-27: qty 90, 30d supply, fill #0
  Filled 2023-02-01: qty 90, 30d supply, fill #1

## 2022-12-27 MED ORDER — TACROLIMUS 0.1 % EX OINT
TOPICAL_OINTMENT | CUTANEOUS | 5 refills | Status: AC
  Filled 2022-12-27: qty 30, 30d supply, fill #0
  Filled 2023-02-01: qty 30, 30d supply, fill #1
  Filled 2023-02-28: qty 30, 30d supply, fill #2
  Filled 2023-04-17: qty 30, 30d supply, fill #3
  Filled 2023-06-04: qty 30, 30d supply, fill #4
  Filled 2023-11-13: qty 30, 30d supply, fill #5

## 2022-12-27 MED ORDER — TRIAMCINOLONE ACETONIDE 0.1 % EX OINT
TOPICAL_OINTMENT | CUTANEOUS | 5 refills | Status: AC
  Filled 2022-12-27: qty 454, 30d supply, fill #0
  Filled 2023-11-13: qty 454, 30d supply, fill #1

## 2022-12-29 ENCOUNTER — Other Ambulatory Visit (HOSPITAL_COMMUNITY): Payer: Self-pay

## 2023-01-02 ENCOUNTER — Other Ambulatory Visit (HOSPITAL_BASED_OUTPATIENT_CLINIC_OR_DEPARTMENT_OTHER): Payer: Self-pay

## 2023-01-02 MED ORDER — HYDROMORPHONE HCL 4 MG PO TABS
4.0000 mg | ORAL_TABLET | Freq: Two times a day (BID) | ORAL | 0 refills | Status: AC
  Filled 2023-01-03: qty 60, 30d supply, fill #0

## 2023-01-03 ENCOUNTER — Other Ambulatory Visit (HOSPITAL_BASED_OUTPATIENT_CLINIC_OR_DEPARTMENT_OTHER): Payer: Self-pay

## 2023-01-11 ENCOUNTER — Other Ambulatory Visit (HOSPITAL_BASED_OUTPATIENT_CLINIC_OR_DEPARTMENT_OTHER): Payer: Self-pay

## 2023-01-11 MED ORDER — LORAZEPAM 1 MG PO TABS
1.0000 mg | ORAL_TABLET | Freq: Two times a day (BID) | ORAL | 1 refills | Status: AC | PRN
  Filled 2023-06-04: qty 60, 30d supply, fill #0

## 2023-01-11 MED ORDER — VENLAFAXINE HCL ER 75 MG PO CP24
75.0000 mg | ORAL_CAPSULE | Freq: Every day | ORAL | 0 refills | Status: DC
  Filled 2023-01-11: qty 30, 30d supply, fill #0

## 2023-01-11 MED ORDER — VILAZODONE HCL 10 MG PO TABS
10.0000 mg | ORAL_TABLET | Freq: Every day | ORAL | 0 refills | Status: AC
  Filled 2023-01-11: qty 30, 30d supply, fill #0

## 2023-01-30 ENCOUNTER — Other Ambulatory Visit (HOSPITAL_BASED_OUTPATIENT_CLINIC_OR_DEPARTMENT_OTHER): Payer: Self-pay

## 2023-01-30 MED ORDER — HYDROMORPHONE HCL 4 MG PO TABS
4.0000 mg | ORAL_TABLET | Freq: Two times a day (BID) | ORAL | 0 refills | Status: AC
  Filled 2023-02-02: qty 60, 30d supply, fill #0

## 2023-02-01 ENCOUNTER — Other Ambulatory Visit (HOSPITAL_BASED_OUTPATIENT_CLINIC_OR_DEPARTMENT_OTHER): Payer: Self-pay

## 2023-02-02 ENCOUNTER — Other Ambulatory Visit (HOSPITAL_BASED_OUTPATIENT_CLINIC_OR_DEPARTMENT_OTHER): Payer: Self-pay

## 2023-02-02 ENCOUNTER — Other Ambulatory Visit (HOSPITAL_COMMUNITY): Payer: Self-pay

## 2023-02-02 MED ORDER — OMEPRAZOLE 40 MG PO CPDR
40.0000 mg | DELAYED_RELEASE_CAPSULE | Freq: Every day | ORAL | 3 refills | Status: AC
  Filled 2023-02-02 – 2023-02-12 (×2): qty 90, 90d supply, fill #0
  Filled 2023-06-04: qty 90, 90d supply, fill #1
  Filled 2023-08-29: qty 90, 90d supply, fill #2
  Filled 2023-11-13: qty 90, 90d supply, fill #3

## 2023-02-12 ENCOUNTER — Other Ambulatory Visit (HOSPITAL_BASED_OUTPATIENT_CLINIC_OR_DEPARTMENT_OTHER): Payer: Self-pay

## 2023-02-16 ENCOUNTER — Other Ambulatory Visit: Payer: Self-pay

## 2023-02-16 ENCOUNTER — Other Ambulatory Visit (HOSPITAL_BASED_OUTPATIENT_CLINIC_OR_DEPARTMENT_OTHER): Payer: Self-pay

## 2023-02-16 MED ORDER — LORAZEPAM 1 MG PO TABS
1.0000 mg | ORAL_TABLET | Freq: Three times a day (TID) | ORAL | 2 refills | Status: AC | PRN
  Filled 2023-02-16: qty 90, 30d supply, fill #0
  Filled 2023-04-03: qty 90, 30d supply, fill #1
  Filled 2023-05-03: qty 90, 30d supply, fill #2

## 2023-02-16 MED ORDER — VENLAFAXINE HCL ER 75 MG PO CP24
75.0000 mg | ORAL_CAPSULE | Freq: Every day | ORAL | 0 refills | Status: DC
  Filled 2023-02-16: qty 90, 90d supply, fill #0

## 2023-02-16 MED ORDER — VILAZODONE HCL 20 MG PO TABS
20.0000 mg | ORAL_TABLET | Freq: Every day | ORAL | 0 refills | Status: DC
  Filled 2023-02-16 – 2023-02-28 (×3): qty 90, 90d supply, fill #0

## 2023-02-19 ENCOUNTER — Other Ambulatory Visit (HOSPITAL_BASED_OUTPATIENT_CLINIC_OR_DEPARTMENT_OTHER): Payer: Self-pay

## 2023-02-26 ENCOUNTER — Other Ambulatory Visit (HOSPITAL_BASED_OUTPATIENT_CLINIC_OR_DEPARTMENT_OTHER): Payer: Self-pay

## 2023-02-28 ENCOUNTER — Other Ambulatory Visit (HOSPITAL_BASED_OUTPATIENT_CLINIC_OR_DEPARTMENT_OTHER): Payer: Self-pay

## 2023-03-01 ENCOUNTER — Other Ambulatory Visit: Payer: Self-pay

## 2023-03-06 ENCOUNTER — Other Ambulatory Visit (HOSPITAL_BASED_OUTPATIENT_CLINIC_OR_DEPARTMENT_OTHER): Payer: Self-pay

## 2023-03-06 ENCOUNTER — Other Ambulatory Visit: Payer: Self-pay

## 2023-03-06 MED ORDER — METHIMAZOLE 5 MG PO TABS
5.0000 mg | ORAL_TABLET | Freq: Every day | ORAL | 3 refills | Status: AC
  Filled 2023-03-06 – 2023-03-16 (×3): qty 90, 90d supply, fill #0
  Filled 2023-06-04: qty 90, 90d supply, fill #1
  Filled 2023-11-13: qty 90, 90d supply, fill #2

## 2023-03-06 MED ORDER — HYDROMORPHONE HCL 2 MG PO TABS
2.0000 mg | ORAL_TABLET | Freq: Two times a day (BID) | ORAL | 0 refills | Status: DC
  Filled 2023-03-06 – 2023-03-16 (×3): qty 60, 30d supply, fill #0

## 2023-03-13 ENCOUNTER — Other Ambulatory Visit (HOSPITAL_BASED_OUTPATIENT_CLINIC_OR_DEPARTMENT_OTHER): Payer: Self-pay

## 2023-03-14 ENCOUNTER — Other Ambulatory Visit: Payer: Self-pay

## 2023-03-15 ENCOUNTER — Other Ambulatory Visit (HOSPITAL_BASED_OUTPATIENT_CLINIC_OR_DEPARTMENT_OTHER): Payer: Self-pay

## 2023-03-15 ENCOUNTER — Other Ambulatory Visit: Payer: Self-pay

## 2023-03-16 ENCOUNTER — Other Ambulatory Visit (HOSPITAL_BASED_OUTPATIENT_CLINIC_OR_DEPARTMENT_OTHER): Payer: Self-pay

## 2023-03-16 ENCOUNTER — Other Ambulatory Visit: Payer: Self-pay

## 2023-04-03 ENCOUNTER — Other Ambulatory Visit (HOSPITAL_BASED_OUTPATIENT_CLINIC_OR_DEPARTMENT_OTHER): Payer: Self-pay

## 2023-04-03 MED ORDER — HYDROMORPHONE HCL 2 MG PO TABS
2.0000 mg | ORAL_TABLET | Freq: Two times a day (BID) | ORAL | 0 refills | Status: DC
  Filled 2023-04-20: qty 60, 30d supply, fill #0

## 2023-04-17 ENCOUNTER — Other Ambulatory Visit (HOSPITAL_BASED_OUTPATIENT_CLINIC_OR_DEPARTMENT_OTHER): Payer: Self-pay

## 2023-04-17 MED ORDER — VENLAFAXINE HCL ER 75 MG PO CP24
75.0000 mg | ORAL_CAPSULE | Freq: Every day | ORAL | 0 refills | Status: DC
  Filled 2023-04-17 – 2023-05-03 (×2): qty 90, 90d supply, fill #0

## 2023-04-20 ENCOUNTER — Other Ambulatory Visit: Payer: Self-pay

## 2023-04-20 ENCOUNTER — Other Ambulatory Visit (HOSPITAL_BASED_OUTPATIENT_CLINIC_OR_DEPARTMENT_OTHER): Payer: Self-pay

## 2023-04-25 ENCOUNTER — Other Ambulatory Visit (HOSPITAL_BASED_OUTPATIENT_CLINIC_OR_DEPARTMENT_OTHER): Payer: Self-pay

## 2023-05-02 ENCOUNTER — Other Ambulatory Visit (HOSPITAL_BASED_OUTPATIENT_CLINIC_OR_DEPARTMENT_OTHER): Payer: Self-pay

## 2023-05-02 MED ORDER — HYDROMORPHONE HCL 2 MG PO TABS
2.0000 mg | ORAL_TABLET | Freq: Two times a day (BID) | ORAL | 0 refills | Status: DC
  Filled 2023-05-02 – 2023-07-02 (×3): qty 60, 30d supply, fill #0

## 2023-05-03 ENCOUNTER — Other Ambulatory Visit (HOSPITAL_BASED_OUTPATIENT_CLINIC_OR_DEPARTMENT_OTHER): Payer: Self-pay

## 2023-05-04 ENCOUNTER — Other Ambulatory Visit: Payer: Self-pay

## 2023-05-18 ENCOUNTER — Other Ambulatory Visit (HOSPITAL_BASED_OUTPATIENT_CLINIC_OR_DEPARTMENT_OTHER): Payer: Self-pay

## 2023-05-18 MED ORDER — LORAZEPAM 1 MG PO TABS
1.0000 mg | ORAL_TABLET | Freq: Three times a day (TID) | ORAL | 2 refills | Status: AC | PRN
  Filled 2023-05-18 – 2023-07-02 (×3): qty 90, 30d supply, fill #0
  Filled 2023-08-02: qty 90, 30d supply, fill #1

## 2023-05-18 MED ORDER — VENLAFAXINE HCL ER 75 MG PO CP24
75.0000 mg | ORAL_CAPSULE | Freq: Every day | ORAL | 0 refills | Status: DC
  Filled 2023-05-18 – 2023-06-04 (×2): qty 90, 90d supply, fill #0

## 2023-05-18 MED ORDER — VILAZODONE HCL 20 MG PO TABS
20.0000 mg | ORAL_TABLET | Freq: Every day | ORAL | 0 refills | Status: DC
  Filled 2023-05-18 – 2023-06-04 (×2): qty 90, 90d supply, fill #0

## 2023-05-31 ENCOUNTER — Other Ambulatory Visit (HOSPITAL_BASED_OUTPATIENT_CLINIC_OR_DEPARTMENT_OTHER): Payer: Self-pay

## 2023-05-31 MED ORDER — HYDROMORPHONE HCL 2 MG PO TABS
2.0000 mg | ORAL_TABLET | Freq: Two times a day (BID) | ORAL | 0 refills | Status: AC
  Filled 2023-05-31: qty 60, 30d supply, fill #0

## 2023-06-04 ENCOUNTER — Other Ambulatory Visit (HOSPITAL_BASED_OUTPATIENT_CLINIC_OR_DEPARTMENT_OTHER): Payer: Self-pay

## 2023-06-04 ENCOUNTER — Other Ambulatory Visit: Payer: Self-pay

## 2023-06-29 ENCOUNTER — Other Ambulatory Visit (HOSPITAL_BASED_OUTPATIENT_CLINIC_OR_DEPARTMENT_OTHER): Payer: Self-pay

## 2023-06-29 MED ORDER — TACROLIMUS 0.1 % EX OINT
TOPICAL_OINTMENT | CUTANEOUS | 5 refills | Status: AC
  Filled 2023-06-29: qty 100, 30d supply, fill #0
  Filled 2023-08-07: qty 100, 60d supply, fill #1

## 2023-07-02 ENCOUNTER — Other Ambulatory Visit: Payer: Self-pay

## 2023-07-02 ENCOUNTER — Other Ambulatory Visit (HOSPITAL_BASED_OUTPATIENT_CLINIC_OR_DEPARTMENT_OTHER): Payer: Self-pay

## 2023-08-02 ENCOUNTER — Other Ambulatory Visit (HOSPITAL_BASED_OUTPATIENT_CLINIC_OR_DEPARTMENT_OTHER): Payer: Self-pay

## 2023-08-03 ENCOUNTER — Other Ambulatory Visit (HOSPITAL_BASED_OUTPATIENT_CLINIC_OR_DEPARTMENT_OTHER): Payer: Self-pay

## 2023-08-03 MED ORDER — HYDROMORPHONE HCL 2 MG PO TABS
2.0000 mg | ORAL_TABLET | Freq: Two times a day (BID) | ORAL | 0 refills | Status: AC
  Filled 2023-08-03: qty 60, 30d supply, fill #0

## 2023-08-07 ENCOUNTER — Other Ambulatory Visit (HOSPITAL_BASED_OUTPATIENT_CLINIC_OR_DEPARTMENT_OTHER): Payer: Self-pay

## 2023-08-07 MED ORDER — PERMETHRIN 5 % EX CREA
1.0000 | TOPICAL_CREAM | CUTANEOUS | 1 refills | Status: AC
  Filled 2023-08-07: qty 60, 30d supply, fill #0
  Filled 2023-08-29: qty 60, 30d supply, fill #1

## 2023-08-07 MED ORDER — HYDROMORPHONE HCL 2 MG PO TABS
2.0000 mg | ORAL_TABLET | Freq: Two times a day (BID) | ORAL | 0 refills | Status: AC
  Filled 2023-08-07: qty 60, 30d supply, fill #0
  Filled ????-??-??: fill #0

## 2023-08-08 ENCOUNTER — Other Ambulatory Visit (HOSPITAL_BASED_OUTPATIENT_CLINIC_OR_DEPARTMENT_OTHER): Payer: Self-pay

## 2023-08-08 MED ORDER — METHIMAZOLE 5 MG PO TABS
5.0000 mg | ORAL_TABLET | ORAL | 3 refills | Status: AC
  Filled 2023-08-29: qty 45, 90d supply, fill #0

## 2023-08-16 ENCOUNTER — Other Ambulatory Visit (HOSPITAL_BASED_OUTPATIENT_CLINIC_OR_DEPARTMENT_OTHER): Payer: Self-pay

## 2023-08-16 MED ORDER — LORAZEPAM 1 MG PO TABS
1.0000 mg | ORAL_TABLET | Freq: Three times a day (TID) | ORAL | 2 refills | Status: AC | PRN
  Filled 2023-08-16 – 2023-08-30 (×2): qty 90, 30d supply, fill #0
  Filled 2023-10-17: qty 90, 30d supply, fill #1
  Filled 2023-11-15: qty 90, 30d supply, fill #2

## 2023-08-16 MED ORDER — VILAZODONE HCL 20 MG PO TABS
20.0000 mg | ORAL_TABLET | Freq: Every day | ORAL | 0 refills | Status: DC
  Filled 2023-08-16 – 2023-08-29 (×2): qty 90, 90d supply, fill #0

## 2023-08-16 MED ORDER — VENLAFAXINE HCL ER 75 MG PO CP24
75.0000 mg | ORAL_CAPSULE | Freq: Every day | ORAL | 0 refills | Status: DC
  Filled 2023-08-16 – 2023-08-29 (×2): qty 90, 90d supply, fill #0

## 2023-08-29 ENCOUNTER — Other Ambulatory Visit (HOSPITAL_BASED_OUTPATIENT_CLINIC_OR_DEPARTMENT_OTHER): Payer: Self-pay

## 2023-08-29 MED ORDER — PROPRANOLOL HCL 80 MG PO TABS
80.0000 mg | ORAL_TABLET | Freq: Three times a day (TID) | ORAL | 3 refills | Status: DC
  Filled 2023-08-29: qty 270, 90d supply, fill #0
  Filled 2023-11-13: qty 270, 90d supply, fill #1
  Filled 2024-03-25: qty 270, 90d supply, fill #2
  Filled 2024-06-24: qty 270, 90d supply, fill #3

## 2023-08-30 ENCOUNTER — Other Ambulatory Visit (HOSPITAL_BASED_OUTPATIENT_CLINIC_OR_DEPARTMENT_OTHER): Payer: Self-pay

## 2023-08-30 ENCOUNTER — Other Ambulatory Visit: Payer: Self-pay

## 2023-09-10 ENCOUNTER — Other Ambulatory Visit (HOSPITAL_BASED_OUTPATIENT_CLINIC_OR_DEPARTMENT_OTHER): Payer: Self-pay

## 2023-09-10 MED ORDER — HYDROMORPHONE HCL 2 MG PO TABS
2.0000 mg | ORAL_TABLET | Freq: Two times a day (BID) | ORAL | 0 refills | Status: AC
Start: 2023-09-10 — End: ?
  Filled 2023-09-10: qty 60, 30d supply, fill #0

## 2023-09-11 ENCOUNTER — Other Ambulatory Visit: Payer: Self-pay

## 2023-09-21 ENCOUNTER — Other Ambulatory Visit (HOSPITAL_BASED_OUTPATIENT_CLINIC_OR_DEPARTMENT_OTHER): Payer: Self-pay

## 2023-10-17 ENCOUNTER — Other Ambulatory Visit (HOSPITAL_BASED_OUTPATIENT_CLINIC_OR_DEPARTMENT_OTHER): Payer: Self-pay

## 2023-11-13 ENCOUNTER — Other Ambulatory Visit (HOSPITAL_BASED_OUTPATIENT_CLINIC_OR_DEPARTMENT_OTHER): Payer: Self-pay

## 2023-11-13 MED ORDER — HYDROMORPHONE HCL 2 MG PO TABS
2.0000 mg | ORAL_TABLET | Freq: Two times a day (BID) | ORAL | 0 refills | Status: AC
  Filled 2023-11-13: qty 60, 30d supply, fill #0

## 2023-11-14 ENCOUNTER — Other Ambulatory Visit (HOSPITAL_BASED_OUTPATIENT_CLINIC_OR_DEPARTMENT_OTHER): Payer: Self-pay

## 2023-11-14 ENCOUNTER — Other Ambulatory Visit: Payer: Self-pay

## 2023-11-14 MED ORDER — VILAZODONE HCL 20 MG PO TABS
20.0000 mg | ORAL_TABLET | Freq: Every day | ORAL | 0 refills | Status: AC
  Filled 2023-11-14 – 2023-12-14 (×3): qty 90, 90d supply, fill #0

## 2023-11-14 MED ORDER — VENLAFAXINE HCL ER 75 MG PO CP24
75.0000 mg | ORAL_CAPSULE | Freq: Every day | ORAL | 0 refills | Status: AC
  Filled 2023-11-14: qty 90, 90d supply, fill #0

## 2023-11-15 ENCOUNTER — Other Ambulatory Visit: Payer: Self-pay

## 2023-11-15 ENCOUNTER — Other Ambulatory Visit (HOSPITAL_BASED_OUTPATIENT_CLINIC_OR_DEPARTMENT_OTHER): Payer: Self-pay

## 2023-11-15 MED ORDER — VILAZODONE HCL 20 MG PO TABS
20.0000 mg | ORAL_TABLET | Freq: Every day | ORAL | 0 refills | Status: AC
  Filled 2023-11-15: qty 90, 90d supply, fill #0
  Filled 2024-06-24: qty 30, 30d supply, fill #0

## 2023-11-15 MED ORDER — VENLAFAXINE HCL ER 75 MG PO CP24
75.0000 mg | ORAL_CAPSULE | Freq: Every day | ORAL | 0 refills | Status: AC
  Filled 2023-11-15: qty 90, 90d supply, fill #0

## 2023-11-15 MED ORDER — LORAZEPAM 1 MG PO TABS
1.0000 mg | ORAL_TABLET | Freq: Three times a day (TID) | ORAL | 2 refills | Status: DC | PRN
  Filled 2023-11-15 – 2023-12-14 (×2): qty 90, 30d supply, fill #0
  Filled 2024-01-14 (×2): qty 90, 30d supply, fill #1
  Filled 2024-02-12: qty 90, 30d supply, fill #2

## 2023-11-22 ENCOUNTER — Other Ambulatory Visit (HOSPITAL_BASED_OUTPATIENT_CLINIC_OR_DEPARTMENT_OTHER): Payer: Self-pay

## 2023-11-22 MED ORDER — HYDROMORPHONE HCL 2 MG PO TABS
2.0000 mg | ORAL_TABLET | Freq: Two times a day (BID) | ORAL | 0 refills | Status: AC
  Filled 2023-12-14: qty 60, 30d supply, fill #0
  Filled 2023-12-17: qty 14, 7d supply, fill #0
  Filled 2023-12-24: qty 46, 23d supply, fill #1

## 2023-11-26 ENCOUNTER — Other Ambulatory Visit (HOSPITAL_BASED_OUTPATIENT_CLINIC_OR_DEPARTMENT_OTHER): Payer: Self-pay

## 2023-11-27 ENCOUNTER — Other Ambulatory Visit (HOSPITAL_BASED_OUTPATIENT_CLINIC_OR_DEPARTMENT_OTHER): Payer: Self-pay

## 2023-11-28 ENCOUNTER — Other Ambulatory Visit (HOSPITAL_BASED_OUTPATIENT_CLINIC_OR_DEPARTMENT_OTHER): Payer: Self-pay

## 2023-12-03 ENCOUNTER — Other Ambulatory Visit (HOSPITAL_BASED_OUTPATIENT_CLINIC_OR_DEPARTMENT_OTHER): Payer: Self-pay

## 2023-12-03 MED ORDER — QUETIAPINE FUMARATE 100 MG PO TABS
100.0000 mg | ORAL_TABLET | Freq: Every day | ORAL | 0 refills | Status: DC
  Filled 2023-12-03: qty 90, 90d supply, fill #0

## 2023-12-14 ENCOUNTER — Other Ambulatory Visit (HOSPITAL_BASED_OUTPATIENT_CLINIC_OR_DEPARTMENT_OTHER): Payer: Self-pay

## 2023-12-14 ENCOUNTER — Other Ambulatory Visit: Payer: Self-pay

## 2023-12-17 ENCOUNTER — Other Ambulatory Visit (HOSPITAL_BASED_OUTPATIENT_CLINIC_OR_DEPARTMENT_OTHER): Payer: Self-pay

## 2023-12-17 ENCOUNTER — Other Ambulatory Visit: Payer: Self-pay

## 2023-12-24 ENCOUNTER — Other Ambulatory Visit (HOSPITAL_BASED_OUTPATIENT_CLINIC_OR_DEPARTMENT_OTHER): Payer: Self-pay

## 2024-01-01 ENCOUNTER — Other Ambulatory Visit (HOSPITAL_BASED_OUTPATIENT_CLINIC_OR_DEPARTMENT_OTHER): Payer: Self-pay

## 2024-01-01 MED ORDER — HYDROMORPHONE HCL 2 MG PO TABS
2.0000 mg | ORAL_TABLET | Freq: Two times a day (BID) | ORAL | 0 refills | Status: DC
  Filled 2024-05-23: qty 60, 30d supply, fill #0

## 2024-01-11 ENCOUNTER — Other Ambulatory Visit (HOSPITAL_BASED_OUTPATIENT_CLINIC_OR_DEPARTMENT_OTHER): Payer: Self-pay

## 2024-01-11 MED ORDER — IVERMECTIN 3 MG PO TABS
18.0000 mg | ORAL_TABLET | ORAL | 0 refills | Status: AC
  Filled 2024-01-11: qty 12, 14d supply, fill #0

## 2024-01-11 MED ORDER — FLUOCINOLONE ACETONIDE SCALP 0.01 % EX OIL
TOPICAL_OIL | CUTANEOUS | 4 refills | Status: AC
  Filled 2024-01-11: qty 118.28, 30d supply, fill #0

## 2024-01-14 ENCOUNTER — Other Ambulatory Visit: Payer: Self-pay

## 2024-01-14 ENCOUNTER — Other Ambulatory Visit (HOSPITAL_BASED_OUTPATIENT_CLINIC_OR_DEPARTMENT_OTHER): Payer: Self-pay

## 2024-01-29 ENCOUNTER — Other Ambulatory Visit (HOSPITAL_BASED_OUTPATIENT_CLINIC_OR_DEPARTMENT_OTHER): Payer: Self-pay

## 2024-01-29 MED ORDER — HYDROMORPHONE HCL 2 MG PO TABS
2.0000 mg | ORAL_TABLET | Freq: Two times a day (BID) | ORAL | 0 refills | Status: AC
  Filled 2024-01-29: qty 60, 30d supply, fill #0

## 2024-02-12 ENCOUNTER — Other Ambulatory Visit: Payer: Self-pay

## 2024-02-26 ENCOUNTER — Other Ambulatory Visit (HOSPITAL_BASED_OUTPATIENT_CLINIC_OR_DEPARTMENT_OTHER): Payer: Self-pay

## 2024-02-26 MED ORDER — HYDROMORPHONE HCL 2 MG PO TABS
2.0000 mg | ORAL_TABLET | Freq: Two times a day (BID) | ORAL | 0 refills | Status: AC
  Filled 2024-02-26: qty 60, 30d supply, fill #0

## 2024-02-26 MED ORDER — OMEPRAZOLE 40 MG PO CPDR
40.0000 mg | DELAYED_RELEASE_CAPSULE | Freq: Every day | ORAL | 3 refills | Status: AC
  Filled 2024-02-26: qty 90, 90d supply, fill #0
  Filled 2024-05-19: qty 90, 90d supply, fill #1

## 2024-03-04 ENCOUNTER — Other Ambulatory Visit (HOSPITAL_BASED_OUTPATIENT_CLINIC_OR_DEPARTMENT_OTHER): Payer: Self-pay

## 2024-03-10 ENCOUNTER — Other Ambulatory Visit (HOSPITAL_BASED_OUTPATIENT_CLINIC_OR_DEPARTMENT_OTHER): Payer: Self-pay

## 2024-03-10 ENCOUNTER — Other Ambulatory Visit: Payer: Self-pay

## 2024-03-10 MED ORDER — VILAZODONE HCL 20 MG PO TABS
20.0000 mg | ORAL_TABLET | Freq: Every day | ORAL | 0 refills | Status: AC
  Filled 2024-03-10 – 2024-03-11 (×2): qty 90, 90d supply, fill #0

## 2024-03-10 MED ORDER — QUETIAPINE FUMARATE 100 MG PO TABS
100.0000 mg | ORAL_TABLET | Freq: Every day | ORAL | 0 refills | Status: DC
  Filled 2024-03-10 – 2024-03-11 (×2): qty 90, 90d supply, fill #0

## 2024-03-10 MED ORDER — LORAZEPAM 1 MG PO TABS
1.0000 mg | ORAL_TABLET | Freq: Three times a day (TID) | ORAL | 2 refills | Status: DC | PRN
  Filled 2024-03-16: qty 90, 30d supply, fill #0
  Filled 2024-04-15: qty 90, 30d supply, fill #1
  Filled 2024-05-19: qty 90, 30d supply, fill #2

## 2024-03-10 MED ORDER — VENLAFAXINE HCL ER 75 MG PO CP24
75.0000 mg | ORAL_CAPSULE | Freq: Every day | ORAL | 0 refills | Status: AC
  Filled 2024-03-10: qty 90, 90d supply, fill #0

## 2024-03-10 MED ORDER — REXULTI 0.5 MG PO TABS
0.5000 mg | ORAL_TABLET | Freq: Every day | ORAL | 1 refills | Status: AC
Start: 2024-03-10 — End: ?
  Filled 2024-03-10: qty 30, 30d supply, fill #0

## 2024-03-11 ENCOUNTER — Other Ambulatory Visit: Payer: Self-pay

## 2024-03-11 ENCOUNTER — Other Ambulatory Visit (HOSPITAL_BASED_OUTPATIENT_CLINIC_OR_DEPARTMENT_OTHER): Payer: Self-pay

## 2024-03-17 ENCOUNTER — Other Ambulatory Visit: Payer: Self-pay

## 2024-03-17 ENCOUNTER — Other Ambulatory Visit (HOSPITAL_BASED_OUTPATIENT_CLINIC_OR_DEPARTMENT_OTHER): Payer: Self-pay

## 2024-03-25 ENCOUNTER — Other Ambulatory Visit (HOSPITAL_BASED_OUTPATIENT_CLINIC_OR_DEPARTMENT_OTHER): Payer: Self-pay

## 2024-03-25 MED ORDER — HYDROMORPHONE HCL 2 MG PO TABS
2.0000 mg | ORAL_TABLET | Freq: Two times a day (BID) | ORAL | 0 refills | Status: AC
  Filled 2024-03-25: qty 60, 30d supply, fill #0

## 2024-03-26 ENCOUNTER — Other Ambulatory Visit (HOSPITAL_BASED_OUTPATIENT_CLINIC_OR_DEPARTMENT_OTHER): Payer: Self-pay

## 2024-03-26 ENCOUNTER — Other Ambulatory Visit: Payer: Self-pay

## 2024-03-27 ENCOUNTER — Other Ambulatory Visit (HOSPITAL_BASED_OUTPATIENT_CLINIC_OR_DEPARTMENT_OTHER): Payer: Self-pay

## 2024-03-27 ENCOUNTER — Other Ambulatory Visit: Payer: Self-pay

## 2024-04-15 ENCOUNTER — Other Ambulatory Visit: Payer: Self-pay

## 2024-04-23 ENCOUNTER — Other Ambulatory Visit (HOSPITAL_BASED_OUTPATIENT_CLINIC_OR_DEPARTMENT_OTHER): Payer: Self-pay

## 2024-04-23 ENCOUNTER — Other Ambulatory Visit: Payer: Self-pay

## 2024-04-23 MED ORDER — HYDROMORPHONE HCL 2 MG PO TABS
2.0000 mg | ORAL_TABLET | Freq: Three times a day (TID) | ORAL | 0 refills | Status: AC
Start: 2024-04-23 — End: ?
  Filled 2024-04-23: qty 60, 20d supply, fill #0

## 2024-05-02 ENCOUNTER — Other Ambulatory Visit (HOSPITAL_BASED_OUTPATIENT_CLINIC_OR_DEPARTMENT_OTHER): Payer: Self-pay

## 2024-05-19 ENCOUNTER — Other Ambulatory Visit: Payer: Self-pay

## 2024-05-19 ENCOUNTER — Other Ambulatory Visit (HOSPITAL_BASED_OUTPATIENT_CLINIC_OR_DEPARTMENT_OTHER): Payer: Self-pay

## 2024-05-23 ENCOUNTER — Other Ambulatory Visit (HOSPITAL_BASED_OUTPATIENT_CLINIC_OR_DEPARTMENT_OTHER): Payer: Self-pay

## 2024-05-27 ENCOUNTER — Other Ambulatory Visit (HOSPITAL_BASED_OUTPATIENT_CLINIC_OR_DEPARTMENT_OTHER): Payer: Self-pay

## 2024-05-27 MED ORDER — SULFAMETHOXAZOLE-TRIMETHOPRIM 800-160 MG PO TABS
1.0000 | ORAL_TABLET | Freq: Two times a day (BID) | ORAL | 0 refills | Status: AC
  Filled 2024-05-27: qty 14, 7d supply, fill #0

## 2024-05-27 MED ORDER — HYDROMORPHONE HCL 2 MG PO TABS
2.0000 mg | ORAL_TABLET | Freq: Three times a day (TID) | ORAL | 0 refills | Status: AC
  Filled 2024-05-27 – 2024-07-28 (×2): qty 90, 30d supply, fill #0

## 2024-05-27 MED ORDER — FLUCONAZOLE 100 MG PO TABS
100.0000 mg | ORAL_TABLET | Freq: Every day | ORAL | 0 refills | Status: AC
  Filled 2024-05-27: qty 3, 3d supply, fill #0

## 2024-05-28 ENCOUNTER — Other Ambulatory Visit (HOSPITAL_BASED_OUTPATIENT_CLINIC_OR_DEPARTMENT_OTHER): Payer: Self-pay

## 2024-06-03 ENCOUNTER — Other Ambulatory Visit (HOSPITAL_BASED_OUTPATIENT_CLINIC_OR_DEPARTMENT_OTHER): Payer: Self-pay

## 2024-06-03 MED ORDER — LORAZEPAM 1 MG PO TABS
1.0000 mg | ORAL_TABLET | Freq: Three times a day (TID) | ORAL | 2 refills | Status: AC | PRN
  Filled 2024-06-18: qty 90, 30d supply, fill #0
  Filled 2024-10-29: qty 60, 20d supply, fill #1
  Filled 2024-10-29: qty 30, 10d supply, fill #1

## 2024-06-03 MED ORDER — VENLAFAXINE HCL ER 37.5 MG PO CP24
37.5000 mg | ORAL_CAPSULE | Freq: Every day | ORAL | 1 refills | Status: AC
  Filled 2024-06-03: qty 30, 30d supply, fill #0
  Filled 2024-07-28 (×2): qty 30, 30d supply, fill #1

## 2024-06-03 MED ORDER — QUETIAPINE FUMARATE 100 MG PO TABS
100.0000 mg | ORAL_TABLET | Freq: Every day | ORAL | 0 refills | Status: DC
  Filled 2024-06-03: qty 90, 90d supply, fill #0

## 2024-06-03 MED ORDER — VILAZODONE HCL 20 MG PO TABS: 20.0000 mg | ORAL_TABLET | Freq: Every day | ORAL | 0 refills | Status: AC

## 2024-06-18 ENCOUNTER — Other Ambulatory Visit (HOSPITAL_BASED_OUTPATIENT_CLINIC_OR_DEPARTMENT_OTHER): Payer: Self-pay

## 2024-06-24 ENCOUNTER — Other Ambulatory Visit (HOSPITAL_BASED_OUTPATIENT_CLINIC_OR_DEPARTMENT_OTHER): Payer: Self-pay

## 2024-06-24 ENCOUNTER — Other Ambulatory Visit: Payer: Self-pay

## 2024-06-24 MED ORDER — HYDROMORPHONE HCL 2 MG PO TABS
2.0000 mg | ORAL_TABLET | Freq: Three times a day (TID) | ORAL | 0 refills | Status: AC
  Filled 2024-06-24: qty 90, 30d supply, fill #0

## 2024-07-01 ENCOUNTER — Other Ambulatory Visit (HOSPITAL_BASED_OUTPATIENT_CLINIC_OR_DEPARTMENT_OTHER): Payer: Self-pay

## 2024-07-01 MED ORDER — LORAZEPAM 0.5 MG PO TABS
0.5000 mg | ORAL_TABLET | Freq: Four times a day (QID) | ORAL | 0 refills | Status: DC | PRN
  Filled 2024-07-01: qty 120, 30d supply, fill #0

## 2024-07-01 MED ORDER — VILAZODONE HCL 20 MG PO TABS
20.0000 mg | ORAL_TABLET | Freq: Every day | ORAL | 0 refills | Status: AC
  Filled 2024-07-01: qty 90, 90d supply, fill #0

## 2024-07-01 MED ORDER — QUETIAPINE FUMARATE 100 MG PO TABS
100.0000 mg | ORAL_TABLET | Freq: Every day | ORAL | 0 refills | Status: AC
  Filled 2024-07-01 – 2024-12-08 (×3): qty 90, 90d supply, fill #0

## 2024-07-01 MED ORDER — VENLAFAXINE HCL ER 37.5 MG PO CP24
37.5000 mg | ORAL_CAPSULE | Freq: Every day | ORAL | 0 refills | Status: AC
  Filled 2024-07-01 – 2024-12-29 (×2): qty 90, 90d supply, fill #0

## 2024-07-02 ENCOUNTER — Other Ambulatory Visit (HOSPITAL_BASED_OUTPATIENT_CLINIC_OR_DEPARTMENT_OTHER): Payer: Self-pay

## 2024-07-02 ENCOUNTER — Other Ambulatory Visit: Payer: Self-pay

## 2024-07-11 ENCOUNTER — Other Ambulatory Visit (HOSPITAL_BASED_OUTPATIENT_CLINIC_OR_DEPARTMENT_OTHER): Payer: Self-pay

## 2024-07-28 ENCOUNTER — Other Ambulatory Visit (HOSPITAL_COMMUNITY): Payer: Self-pay

## 2024-07-28 ENCOUNTER — Other Ambulatory Visit (HOSPITAL_BASED_OUTPATIENT_CLINIC_OR_DEPARTMENT_OTHER): Payer: Self-pay

## 2024-07-28 ENCOUNTER — Other Ambulatory Visit: Payer: Self-pay

## 2024-07-28 MED ORDER — QUETIAPINE FUMARATE 100 MG PO TABS
100.0000 mg | ORAL_TABLET | Freq: Every day | ORAL | 1 refills | Status: DC
  Filled 2024-07-28: qty 90, 90d supply, fill #0
  Filled 2024-09-29: qty 90, 90d supply, fill #1

## 2024-07-28 MED ORDER — OMEPRAZOLE 40 MG PO CPDR
40.0000 mg | DELAYED_RELEASE_CAPSULE | Freq: Every day | ORAL | 3 refills | Status: AC
  Filled 2024-07-28: qty 90, 90d supply, fill #0
  Filled 2024-10-29: qty 90, 90d supply, fill #1

## 2024-07-28 MED ORDER — LORAZEPAM 0.5 MG PO TABS
0.5000 mg | ORAL_TABLET | Freq: Four times a day (QID) | ORAL | 0 refills | Status: DC | PRN
  Filled 2024-07-28 – 2024-07-30 (×3): qty 120, 30d supply, fill #0

## 2024-07-28 MED ORDER — HYDROMORPHONE HCL 2 MG PO TABS
2.0000 mg | ORAL_TABLET | Freq: Three times a day (TID) | ORAL | 0 refills | Status: AC
  Filled 2024-07-28 – 2024-08-29 (×2): qty 90, 30d supply, fill #0

## 2024-07-30 ENCOUNTER — Other Ambulatory Visit (HOSPITAL_BASED_OUTPATIENT_CLINIC_OR_DEPARTMENT_OTHER): Payer: Self-pay

## 2024-08-19 ENCOUNTER — Other Ambulatory Visit (HOSPITAL_BASED_OUTPATIENT_CLINIC_OR_DEPARTMENT_OTHER): Payer: Self-pay

## 2024-08-19 MED ORDER — HYDROMORPHONE HCL 2 MG PO TABS
2.0000 mg | ORAL_TABLET | Freq: Three times a day (TID) | ORAL | 0 refills | Status: AC
Start: 2024-08-19 — End: ?
  Filled 2024-11-27: qty 90, 30d supply, fill #0

## 2024-08-21 ENCOUNTER — Other Ambulatory Visit: Payer: Self-pay

## 2024-08-21 ENCOUNTER — Other Ambulatory Visit (HOSPITAL_BASED_OUTPATIENT_CLINIC_OR_DEPARTMENT_OTHER): Payer: Self-pay

## 2024-08-21 MED ORDER — LORAZEPAM 0.5 MG PO TABS
0.5000 mg | ORAL_TABLET | Freq: Four times a day (QID) | ORAL | 2 refills | Status: AC | PRN
  Filled 2024-08-29 (×2): qty 120, 30d supply, fill #0
  Filled 2024-09-25: qty 120, 30d supply, fill #1

## 2024-08-21 MED ORDER — VENLAFAXINE HCL ER 37.5 MG PO CP24
37.5000 mg | ORAL_CAPSULE | Freq: Every day | ORAL | 0 refills | Status: AC
  Filled 2024-08-21 (×2): qty 90, 90d supply, fill #0

## 2024-08-21 MED ORDER — VILAZODONE HCL 20 MG PO TABS
20.0000 mg | ORAL_TABLET | Freq: Every day | ORAL | 0 refills | Status: AC
  Filled 2024-08-29: qty 30, 30d supply, fill #0
  Filled 2024-09-25: qty 30, 30d supply, fill #1

## 2024-08-21 MED ORDER — QUETIAPINE FUMARATE 100 MG PO TABS: 100.0000 mg | ORAL_TABLET | Freq: Every day | ORAL | 1 refills | Status: AC

## 2024-08-29 ENCOUNTER — Other Ambulatory Visit: Payer: Self-pay

## 2024-08-29 ENCOUNTER — Other Ambulatory Visit (HOSPITAL_BASED_OUTPATIENT_CLINIC_OR_DEPARTMENT_OTHER): Payer: Self-pay

## 2024-08-29 ENCOUNTER — Other Ambulatory Visit (HOSPITAL_COMMUNITY): Payer: Self-pay

## 2024-08-29 MED ORDER — PROPRANOLOL HCL 80 MG PO TABS
80.0000 mg | ORAL_TABLET | Freq: Three times a day (TID) | ORAL | 3 refills | Status: AC
  Filled 2024-08-29 – 2024-09-01 (×2): qty 270, 90d supply, fill #0
  Filled 2024-11-27: qty 270, 90d supply, fill #1
  Filled 2024-12-29: qty 270, 90d supply, fill #2

## 2024-09-01 ENCOUNTER — Other Ambulatory Visit (HOSPITAL_BASED_OUTPATIENT_CLINIC_OR_DEPARTMENT_OTHER): Payer: Self-pay

## 2024-09-02 ENCOUNTER — Other Ambulatory Visit (HOSPITAL_BASED_OUTPATIENT_CLINIC_OR_DEPARTMENT_OTHER): Payer: Self-pay

## 2024-09-02 MED ORDER — HYDROMORPHONE HCL 2 MG PO TABS
2.0000 mg | ORAL_TABLET | Freq: Three times a day (TID) | ORAL | 0 refills | Status: AC
  Filled 2024-09-29: qty 90, 30d supply, fill #0

## 2024-09-18 ENCOUNTER — Other Ambulatory Visit (HOSPITAL_BASED_OUTPATIENT_CLINIC_OR_DEPARTMENT_OTHER): Payer: Self-pay

## 2024-09-26 ENCOUNTER — Other Ambulatory Visit: Payer: Self-pay

## 2024-09-26 ENCOUNTER — Other Ambulatory Visit (HOSPITAL_BASED_OUTPATIENT_CLINIC_OR_DEPARTMENT_OTHER): Payer: Self-pay

## 2024-09-29 ENCOUNTER — Other Ambulatory Visit (HOSPITAL_BASED_OUTPATIENT_CLINIC_OR_DEPARTMENT_OTHER): Payer: Self-pay

## 2024-10-14 ENCOUNTER — Other Ambulatory Visit (HOSPITAL_BASED_OUTPATIENT_CLINIC_OR_DEPARTMENT_OTHER): Payer: Self-pay

## 2024-10-14 MED ORDER — HYDROMORPHONE HCL 2 MG PO TABS
2.0000 mg | ORAL_TABLET | Freq: Three times a day (TID) | ORAL | 0 refills | Status: AC
  Filled 2024-10-29: qty 90, 30d supply, fill #0

## 2024-10-17 ENCOUNTER — Other Ambulatory Visit (HOSPITAL_BASED_OUTPATIENT_CLINIC_OR_DEPARTMENT_OTHER): Payer: Self-pay

## 2024-10-29 ENCOUNTER — Other Ambulatory Visit (HOSPITAL_BASED_OUTPATIENT_CLINIC_OR_DEPARTMENT_OTHER): Payer: Self-pay

## 2024-10-29 ENCOUNTER — Other Ambulatory Visit: Payer: Self-pay

## 2024-11-17 ENCOUNTER — Other Ambulatory Visit (HOSPITAL_BASED_OUTPATIENT_CLINIC_OR_DEPARTMENT_OTHER): Payer: Self-pay

## 2024-11-17 MED ORDER — HYDROMORPHONE HCL 2 MG PO TABS
2.0000 mg | ORAL_TABLET | Freq: Three times a day (TID) | ORAL | 0 refills | Status: AC
  Filled 2024-11-17: qty 90, 30d supply, fill #0

## 2024-11-20 ENCOUNTER — Other Ambulatory Visit: Payer: Self-pay

## 2024-11-20 ENCOUNTER — Other Ambulatory Visit (HOSPITAL_BASED_OUTPATIENT_CLINIC_OR_DEPARTMENT_OTHER): Payer: Self-pay

## 2024-11-20 MED ORDER — QUETIAPINE FUMARATE 100 MG PO TABS
100.0000 mg | ORAL_TABLET | Freq: Every day | ORAL | 1 refills | Status: AC
  Filled 2024-11-20: qty 30, 30d supply, fill #0
  Filled 2024-12-29: qty 90, 90d supply, fill #0

## 2024-11-20 MED ORDER — VILAZODONE HCL 20 MG PO TABS
20.0000 mg | ORAL_TABLET | Freq: Every day | ORAL | 0 refills | Status: AC
  Filled 2024-11-20: qty 30, 30d supply, fill #0
  Filled 2024-12-29: qty 30, 30d supply, fill #1

## 2024-11-20 MED ORDER — VENLAFAXINE HCL ER 37.5 MG PO CP24
37.5000 mg | ORAL_CAPSULE | Freq: Every day | ORAL | 0 refills | Status: AC
  Filled 2024-11-20: qty 90, 90d supply, fill #0

## 2024-11-20 MED ORDER — LORAZEPAM 0.5 MG PO TABS
0.5000 mg | ORAL_TABLET | Freq: Four times a day (QID) | ORAL | 2 refills | Status: AC | PRN
  Filled 2024-11-20 – 2024-11-27 (×2): qty 120, 30d supply, fill #0
  Filled 2024-12-29: qty 120, 30d supply, fill #1

## 2024-11-27 ENCOUNTER — Other Ambulatory Visit: Payer: Self-pay

## 2024-11-27 ENCOUNTER — Other Ambulatory Visit (HOSPITAL_BASED_OUTPATIENT_CLINIC_OR_DEPARTMENT_OTHER): Payer: Self-pay

## 2024-12-08 ENCOUNTER — Other Ambulatory Visit (HOSPITAL_BASED_OUTPATIENT_CLINIC_OR_DEPARTMENT_OTHER): Payer: Self-pay

## 2024-12-09 ENCOUNTER — Other Ambulatory Visit (HOSPITAL_BASED_OUTPATIENT_CLINIC_OR_DEPARTMENT_OTHER): Payer: Self-pay

## 2024-12-10 ENCOUNTER — Other Ambulatory Visit (HOSPITAL_BASED_OUTPATIENT_CLINIC_OR_DEPARTMENT_OTHER): Payer: Self-pay

## 2024-12-10 MED ORDER — QUETIAPINE FUMARATE 100 MG PO TABS
100.0000 mg | ORAL_TABLET | Freq: Every evening | ORAL | 1 refills | Status: AC
  Filled 2024-12-10: qty 90, 90d supply, fill #0

## 2024-12-29 ENCOUNTER — Other Ambulatory Visit (HOSPITAL_BASED_OUTPATIENT_CLINIC_OR_DEPARTMENT_OTHER): Payer: Self-pay

## 2024-12-29 MED ORDER — HYDROMORPHONE HCL 2 MG PO TABS
2.0000 mg | ORAL_TABLET | Freq: Three times a day (TID) | ORAL | 0 refills | Status: AC
  Filled 2024-12-29: qty 90, 30d supply, fill #0
